# Patient Record
Sex: Male | Born: 1965 | Race: White | Hispanic: No | Marital: Married | Smoking: Former smoker
Health system: Southern US, Community
[De-identification: ages and names within clinical notes are randomized; demographics above are authoritative.]

## PROBLEM LIST (undated history)

## (undated) DIAGNOSIS — I10 Essential (primary) hypertension: Secondary | ICD-10-CM

## (undated) DIAGNOSIS — K5792 Diverticulitis of intestine, part unspecified, without perforation or abscess without bleeding: Secondary | ICD-10-CM

## (undated) DIAGNOSIS — E079 Disorder of thyroid, unspecified: Secondary | ICD-10-CM

## (undated) DIAGNOSIS — E785 Hyperlipidemia, unspecified: Secondary | ICD-10-CM

## (undated) DIAGNOSIS — K219 Gastro-esophageal reflux disease without esophagitis: Secondary | ICD-10-CM

## (undated) DIAGNOSIS — E039 Hypothyroidism, unspecified: Secondary | ICD-10-CM

## (undated) HISTORY — DX: Essential (primary) hypertension: I10

## (undated) HISTORY — DX: Diverticulitis of intestine, part unspecified, without perforation or abscess without bleeding: K57.92

## (undated) HISTORY — DX: Hyperlipidemia, unspecified: E78.5

## (undated) HISTORY — PX: HERNIA REPAIR: SHX51

## (undated) HISTORY — DX: Disorder of thyroid, unspecified: E07.9

## (undated) HISTORY — DX: Gastro-esophageal reflux disease without esophagitis: K21.9

---

## 2007-12-28 ENCOUNTER — Emergency Department: Payer: Self-pay | Admitting: Emergency Medicine

## 2009-03-20 DIAGNOSIS — K5792 Diverticulitis of intestine, part unspecified, without perforation or abscess without bleeding: Secondary | ICD-10-CM

## 2009-03-20 HISTORY — DX: Diverticulitis of intestine, part unspecified, without perforation or abscess without bleeding: K57.92

## 2009-05-21 ENCOUNTER — Inpatient Hospital Stay: Payer: Self-pay | Admitting: Internal Medicine

## 2009-12-16 ENCOUNTER — Emergency Department: Payer: Self-pay | Admitting: Emergency Medicine

## 2010-12-27 ENCOUNTER — Emergency Department: Payer: Self-pay | Admitting: Internal Medicine

## 2011-08-19 HISTORY — PX: CHOLECYSTECTOMY: SHX55

## 2011-09-02 ENCOUNTER — Emergency Department: Payer: Self-pay | Admitting: Emergency Medicine

## 2011-09-02 LAB — CBC
HGB: 16.2 g/dL (ref 13.0–18.0)
MCH: 30.8 pg (ref 26.0–34.0)
MCHC: 33.5 g/dL (ref 32.0–36.0)
MCV: 92 fL (ref 80–100)
RDW: 13.7 % (ref 11.5–14.5)

## 2011-09-02 LAB — LACTATE DEHYDROGENASE: LDH: 209 U/L (ref 87–241)

## 2011-09-02 LAB — URINALYSIS, COMPLETE
Bacteria: NONE SEEN
Ketone: NEGATIVE
Leukocyte Esterase: NEGATIVE
Ph: 5 (ref 4.5–8.0)
Squamous Epithelial: NONE SEEN

## 2011-09-02 LAB — COMPREHENSIVE METABOLIC PANEL
Albumin: 3.6 g/dL (ref 3.4–5.0)
Alkaline Phosphatase: 320 U/L — ABNORMAL HIGH (ref 50–136)
Anion Gap: 10 (ref 7–16)
BUN: 12 mg/dL (ref 7–18)
Calcium, Total: 9 mg/dL (ref 8.5–10.1)
Co2: 25 mmol/L (ref 21–32)
EGFR (Non-African Amer.): >60
Glucose: 118 mg/dL — ABNORMAL HIGH (ref 65–99)
Osmolality: 282 (ref 275–301)
SGOT(AST): 195 U/L — ABNORMAL HIGH (ref 15–37)
SGPT (ALT): 439 U/L — ABNORMAL HIGH
Sodium: 141 mmol/L (ref 136–145)
Total Protein: 7.8 g/dL (ref 6.4–8.2)

## 2012-06-13 ENCOUNTER — Ambulatory Visit: Payer: Self-pay

## 2013-10-02 ENCOUNTER — Emergency Department: Payer: Self-pay | Admitting: Emergency Medicine

## 2014-05-21 DIAGNOSIS — E039 Hypothyroidism, unspecified: Secondary | ICD-10-CM | POA: Insufficient documentation

## 2014-07-12 NOTE — Consult Note (Signed)
PATIENT NAME:  Douglas Bird, Douglas Bird MR#:  [id number] DATE OF BIRTH:  1965/05/12  DATE OF CONSULTATION:  09/02/2011  CONSULTING PHYSICIAN:  Tana Conch. Leslye Peer, MD PRIMARY CARE PHYSICIAN: [facility]  CHIEF COMPLAINT: Abdominal pain.   HISTORY OF PRESENT ILLNESS: The patient is a 49 year old man who has been having abdominal pain now for days associated with nausea, vomiting, and diarrhea, also a headache. Pain is described as 10 out of 10 in intensity, worse since yesterday. It feels like a volcano erupting pain in his abdomen. Staying very still helps, nothing else makes it better. He has been having fever and chills, yesterday developed dark orange urine. In the Emergency Room, he had an ultrasound of the abdomen that showed multiple gallstones, a mild gallbladder wall thickening, but no positive Murphy's sign. He was found to have an elevated white count at 15, an elevated lipase of 1391. His liver function tests were elevated with a total bilirubin of 7.1, alkaline phosphatase 320, ALT 439, AST 195. Hospitalist Services were contacted for further evaluation.   PAST MEDICAL HISTORY:  1. Diverticulitis. 2. Hashimoto's thyroiditis. 3. Anemia.  4. Gastroesophageal reflux disease.   PAST SURGICAL HISTORY:  1. Bilateral inguinal hernia repair.  2. Cyst on the knee.   ALLERGIES: No known drug allergies.   MEDICATIONS:  1. Prilosec 20 mg daily.  2. Ferrous sulfate 325 mg twice a day.  3. Levothyroxine 200 mcg daily.   SOCIAL HISTORY: Smokes 1 pack per day. He actually quit in the past for 10 years but started back. Alcohol: He drinks at least a sixpack at night, on the weekends either a 12-pack up to a case. He has not drank in the last five days. No drug use. He is a Occupational hygienist.   FAMILY HISTORY: Father died of a CVA with an aneurysm. also had hypertension. Mother died at 60 with complications of a knee replacement that got infected with a staph.   REVIEW OF SYSTEMS:  CONSTITUTIONAL: Positive for fever. Positive for sweating. Positive for chills. Weight up and down. Positive for fatigue. EYES: Does wear glasses. EARS, NOSE, MOUTH, AND THROAT: Positive for right ear being stopped up and decreased hearing. Positive for runny nose. Positive for dry mouth. CARDIOVASCULAR: No chest pain. No palpitations. RESPIRATORY: No shortness of breath. GASTROINTESTINAL: Positive for nausea. Positive for vomiting. Positive for abdominal pain. Occasional bright red blood per rectum. GENITOURINARY: Positive for dark urine. MUSCULOSKELETAL: Positive for joint pain. INTEGUMENT: No rashes or eruptions. NEUROLOGIC: No fainting or blackouts. PSYCHIATRIC: No anxiety or depression. ENDOCRINE: Positive for hypothyroidism. HEMATOLOGIC/LYMPHATIC: Positive for anemia.   PHYSICAL EXAMINATION:  VITAL SIGNS: Temperature 97, pulse 103, respirations 20, blood pressure 120/69, pulse oximetry 100%.   GENERAL: No respiratory distress.   HEENT: Conjunctivae icteric. Lids normal. Pupils are equal, round, and reactive to light. Extraocular muscles are intact. No nystagmus. Ears, nose, mouth, and throat: Tympanic membrane obscured by wax on the right and not on the left. Nasal mucosa no erythema. Throat no erythema. No exudate seen. Lips and gums no lesions.   NECK: No JVD. No bruits. No lymphadenopathy. No thyromegaly. No thyroid nodules palpated.   LUNGS: Lungs are clear to auscultation. No use of accessory muscles to breathe. No rhonchi, rales, or wheeze heard.   HEART: S1, S2 normal. No gallops, rubs, or murmurs heard. Carotid upstroke 2+ bilaterally. No bruits.   EXTREMITIES: Dorsalis pedis pulses 2+ bilaterally. No edema of the lower extremity.  ABDOMEN: Soft. Positive tenderness in the epigastric area  down to the umbilical area. No guarding. No rebound. Normal active bowel sounds. No masses felt.   LYMPHATIC: No lymph nodes in the neck.   MUSCULOSKELETAL: No clubbing, edema, or cyanosis.    SKIN: Icteric.   NEUROLOGICAL: Cranial nerves II through XII are grossly intact. Deep tendon reflexes 2+ bilateral lower extremities.   PSYCHIATRIC: The patient is oriented to person, place, and time.   LABORATORY, DIAGNOSTIC AND RADIOLOGICAL DATA:  Ultrasound of the abdomen showed multiple gallstones, mild gallbladder wall thickening. No positive sonographic Murphy's sign. Glucose 118, BUN 12, creatinine 1.06, sodium 141, potassium 3.4, chloride 106, CO2 25, calcium 9.0, total bilirubin 7.1, alkaline phosphatase 320, ALT 439, AST 195. White blood cell count 15.0, hemoglobin and hematocrit 16.2 and 48.3, platelet count 182, lipase 1391. Urinalysis 2+ bilirubin, 1+ blood. LDH of 209.   ASSESSMENT AND PLAN:  1. Gallstone pancreatitis, possible stone caught in the duct: I did speak with Gastroenterology, Dr. Vira Agar, who recommended a transfer to a tertiary care center since he does not perform ERCP, and we will not have any available over the weekend in case the patient needs one. I will start IV fluid hydration and give a dose of Zosyn. I spoke with Dr. Thomasene Lot to transfer the patient, and he will work on the transfer. I will give a fluid bolus and vigorous IV fluid hydration. If the patient is still here in the morning, we will check liver function tests again.  2. Alcohol abuse: I will put on CIWA protocol. No signs of withdrawal. The patient states that his last drink was five days ago.  3. Tobacco abuse: Smoking cessation counseling done three minutes by me. Nicotine patch ordered.  4. Gastroesophageal reflux disease: We will switch Prilosec over to Protonix IV with first dose stat.  5. Hypothyroidism: Continue levothyroxine.  6. Anemia history: Hemoglobin is actually good right now but probably dehydrated.  7. Cerumen impaction on the right: We will give Debrox ear drops    TIME SPENT ON EMERGENCY ROOM CONSULTATION:  55 minutes.   ____________________________ Tana Conch. Leslye Peer,  MD [id number]:[id number] D: 09/02/2011 18:10:14 ET T: 09/03/2011 10:58:58 ET JOB#: [id number]  cc: Tana Conch. Leslye Peer, MD, <Dictator> [facility] MD ELECTRONICALLY SIGNED 09/04/2011 19:45

## 2014-09-24 ENCOUNTER — Other Ambulatory Visit: Payer: Self-pay

## 2014-10-01 ENCOUNTER — Ambulatory Visit: Payer: Self-pay

## 2014-10-01 DIAGNOSIS — E782 Mixed hyperlipidemia: Secondary | ICD-10-CM | POA: Insufficient documentation

## 2015-04-01 ENCOUNTER — Ambulatory Visit: Payer: Self-pay

## 2015-04-01 DIAGNOSIS — K219 Gastro-esophageal reflux disease without esophagitis: Secondary | ICD-10-CM | POA: Insufficient documentation

## 2015-04-01 DIAGNOSIS — M109 Gout, unspecified: Secondary | ICD-10-CM | POA: Insufficient documentation

## 2015-04-01 LAB — CBC AND DIFFERENTIAL
HEMATOCRIT: 38 % — AB (ref 41–53)
HEMOGLOBIN: 12.2 g/dL — AB (ref 13.5–17.5)
Neutrophils Absolute: 4 /uL
PLATELETS: 218 10*3/uL (ref 150–399)
WBC: 7.6 10*3/mL

## 2015-04-01 LAB — LIPID PANEL
Cholesterol: 203 mg/dL — AB (ref 0–200)
HDL: 36 mg/dL (ref 35–70)
LDL CALC: 120 mg/dL
TRIGLYCERIDES: 237 mg/dL — AB (ref 40–160)

## 2015-04-01 LAB — HEPATIC FUNCTION PANEL
ALT: 22 U/L (ref 10–40)
AST: 21 U/L (ref 14–40)
Alkaline Phosphatase: 84 U/L (ref 25–125)
BILIRUBIN, TOTAL: 0.2 mg/dL

## 2015-04-01 LAB — BASIC METABOLIC PANEL
BUN: 18 mg/dL (ref 4–21)
Creatinine: 0.8 mg/dL (ref 0.6–1.3)
GLUCOSE: 107 mg/dL
Potassium: 4.7 mmol/L (ref 3.4–5.3)
SODIUM: 142 mmol/L (ref 137–147)

## 2015-04-01 LAB — HEMOGLOBIN A1C: Hemoglobin A1C: 5.7

## 2015-04-01 LAB — TSH: TSH: 1.63 u[IU]/mL (ref 0.41–5.90)

## 2015-04-15 ENCOUNTER — Ambulatory Visit: Payer: Self-pay

## 2015-04-15 DIAGNOSIS — D649 Anemia, unspecified: Secondary | ICD-10-CM | POA: Insufficient documentation

## 2015-06-28 DIAGNOSIS — M109 Gout, unspecified: Secondary | ICD-10-CM

## 2015-06-28 DIAGNOSIS — E039 Hypothyroidism, unspecified: Secondary | ICD-10-CM

## 2015-06-28 DIAGNOSIS — D649 Anemia, unspecified: Secondary | ICD-10-CM

## 2015-06-28 DIAGNOSIS — E785 Hyperlipidemia, unspecified: Secondary | ICD-10-CM

## 2015-06-28 DIAGNOSIS — K219 Gastro-esophageal reflux disease without esophagitis: Secondary | ICD-10-CM

## 2015-09-23 ENCOUNTER — Telehealth: Payer: Self-pay

## 2015-09-23 DIAGNOSIS — K219 Gastro-esophageal reflux disease without esophagitis: Secondary | ICD-10-CM

## 2015-09-23 MED ORDER — OMEPRAZOLE 20 MG PO CPDR: 20.0000 mg | DELAYED_RELEASE_CAPSULE | Freq: Every day | ORAL | Status: DC

## 2015-09-27 NOTE — Telephone Encounter (Signed)
Filled medication.

## 2015-10-05 ENCOUNTER — Emergency Department
Admission: EM | Admit: 2015-10-05 | Discharge: 2015-10-05 | Disposition: A | Discharge status: Home / Self Care | Payer: Self-pay | Attending: Emergency Medicine | Admitting: Emergency Medicine

## 2015-10-05 ENCOUNTER — Emergency Department: Payer: Self-pay

## 2015-10-05 DIAGNOSIS — S39012A Strain of muscle, fascia and tendon of lower back, initial encounter: Secondary | ICD-10-CM | POA: Insufficient documentation

## 2015-10-05 DIAGNOSIS — E039 Hypothyroidism, unspecified: Secondary | ICD-10-CM | POA: Insufficient documentation

## 2015-10-05 DIAGNOSIS — Y999 Unspecified external cause status: Secondary | ICD-10-CM | POA: Insufficient documentation

## 2015-10-05 DIAGNOSIS — Y929 Unspecified place or not applicable: Secondary | ICD-10-CM | POA: Insufficient documentation

## 2015-10-05 DIAGNOSIS — F17200 Nicotine dependence, unspecified, uncomplicated: Secondary | ICD-10-CM | POA: Insufficient documentation

## 2015-10-05 DIAGNOSIS — Y939 Activity, unspecified: Secondary | ICD-10-CM | POA: Insufficient documentation

## 2015-10-05 DIAGNOSIS — E785 Hyperlipidemia, unspecified: Secondary | ICD-10-CM | POA: Insufficient documentation

## 2015-10-05 DIAGNOSIS — X58XXXA Exposure to other specified factors, initial encounter: Secondary | ICD-10-CM | POA: Insufficient documentation

## 2015-10-05 LAB — URINALYSIS COMPLETE WITH MICROSCOPIC (ARMC ONLY)
BACTERIA UA: NONE SEEN
Bilirubin Urine: NEGATIVE
GLUCOSE, UA: NEGATIVE mg/dL
KETONES UR: NEGATIVE mg/dL
Leukocytes, UA: NEGATIVE
NITRITE: NEGATIVE
PROTEIN: NEGATIVE mg/dL
SPECIFIC GRAVITY, URINE: 1.021 (ref 1.005–1.030)
pH: 6 (ref 5.0–8.0)

## 2015-10-05 MED ORDER — NAPROXEN 500 MG PO TABS: 500.0000 mg | ORAL_TABLET | Freq: Two times a day (BID) | ORAL | Status: DC

## 2015-10-05 MED ORDER — METHOCARBAMOL 500 MG PO TABS: 500.0000 mg | ORAL_TABLET | Freq: Four times a day (QID) | ORAL | Status: DC | PRN

## 2015-10-05 NOTE — ED Notes (Signed)
Pt reports lower back pain x5 weeks; denies any urinary symptoms.

## 2015-10-05 NOTE — ED Provider Notes (Signed)
[facility] Emergency Department Provider Note   ____________________________________________  Time seen: Approximately 8:02 AM  I have reviewed the triage vital signs and the nursing notes.   HISTORY  Chief Complaint Back Pain    HPI Douglas Bird is a 50 y.o. male who presents today for evaluation of back pain x5 weeks. Patient cant recall any sort of trauma to the area. 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 The pain is in his lower left flank area. Patient denies numbness, tingling, or saddle anesthesia. He denies any redness or swelling. He does admit to heavy drinking and is concerned that the pain may be coming from his kidneys. He has tried anti-inflammatories without relief.   Past Medical History  Diagnosis Date  . GERD (gastroesophageal reflux disease)   . Thyroid disease   . Diverticulitis     Patient Active Problem List   Diagnosis Date Noted  . Anemia 04/15/2015  . GERD (gastroesophageal reflux disease) 04/01/2015  . Gout 04/01/2015  . Hyperlipidemia 10/01/2014  . Hypothyroidism 05/21/2014    Past Surgical History  Procedure Laterality Date  . Cholecystectomy  June 2013    Current Outpatient Rx  Name  Route  Sig  Dispense  Refill  . levothyroxine (SYNTHROID, LEVOTHROID) 200 MCG tablet   Oral   Take 200 mcg by mouth daily before breakfast.         . methocarbamol (ROBAXIN) 500 MG tablet   Oral   Take 1 tablet (500 mg total) by mouth every 6 (six) hours as needed for muscle spasms.   30 tablet   0   . naproxen (NAPROSYN) 500 MG tablet   Oral   Take 1 tablet (500 mg total) by mouth 2 (two) times daily with a meal.   60 tablet   0   . omeprazole (PRILOSEC) 20 MG capsule   Oral   Take 1 capsule (20 mg total) by mouth daily.   30 capsule   3   . simvastatin (ZOCOR) 20 MG tablet   Oral   Take 20 mg by mouth daily.           Allergies Review of  patient's allergies indicates no known allergies.  Family History  Problem Relation Age of Onset  . Stroke Father   . Gout Father   . Aneurysm Father     2006  . Thyroid disease Father   . Epilepsy Father   . Thyroid disease Sister   . Diabetes Paternal Grandfather     Social History Social History  Substance Use Topics  . Smoking status: Current Every Day Smoker -- 0.50 packs/day  . Smokeless tobacco: Not on file  . Alcohol Use: Yes     Comment: Social Drinker    Review of Systems Constitutional: No fever/chills Cardiovascular: Denies chest pain. Respiratory: Denies shortness of breath. Gastrointestinal: No abdominal pain.  No nausea, no vomiting.  No diarrhea.  No constipation. Genitourinary: Negative for dysuria. Musculoskeletal: Patient has left sided flank pain x5 weeks. No numbness or tingling Skin: Negative for rash. Neurological: Negative for headaches, focal weakness or numbness. 10-point ROS otherwise negative.  ____________________________________________   PHYSICAL EXAM:  VITAL SIGNS: ED Triage Vitals  Enc Vitals Group     BP 10/05/15 0758 160/106 mmHg     Pulse Rate 10/05/15 0758 83     Resp 10/05/15 0758 16     Temp 10/05/15  0758 97.4 F (36.3 C)     Temp Source 10/05/15 0758 Oral     SpO2 10/05/15 0758 96 %     Weight 10/05/15 0753 215 lb (97.523 kg)     Height 10/05/15 0753 5\' 10"  (1.778 m)     Head Cir --      Peak Flow --      Pain Score 10/05/15 0753 8     Pain Loc --      Pain Edu? --      Excl. in [place]? --     Constitutional: Alert and oriented. Well appearing and in no acute distress. Head: Atraumatic. Nose: No congestion/rhinnorhea. Mouth/Throat: Mucous membranes are moist.  Oropharynx non-erythematous. Neck: No stridor.   Cardiovascular: Normal rate, regular rhythm. Grossly normal heart sounds.  Good peripheral circulation. Respiratory: Normal respiratory effort.  No retractions. Lungs CTAB. Gastrointestinal: Soft and nontender.  No distention. No abdominal bruits. No CVA tenderness. Musculoskeletal: pain over palpation of left flank, no vertebral tenderness, no swelling or erythema Neurologic:  Normal speech and language. No gross focal neurologic deficits are appreciated. No gait instability. Skin:  Skin is warm, dry and intact. No rash noted. Psychiatric: Mood and affect are normal. Speech and behavior are normal.  ____________________________________________   LABS (all labs ordered are listed, but only abnormal results are displayed)  Labs Reviewed  URINALYSIS COMPLETEWITH MICROSCOPIC ([place] ONLY) - Abnormal; Notable for the following:    Color, Urine YELLOW (*)    APPearance CLEAR (*)    Hgb urine dipstick 1+ (*)    Squamous Epithelial / LPF 0-5 (*)    All other components within normal limits   ____________________________________________  EKG  Not indicated ____________________________________________  RADIOLOGY  FINDINGS: Three views of the lumbar spine submitted. No acute fracture or subluxation. Mild anterior spurring upper endplate of L4 and L5 vertebral body. Mild disc space flattening at L5-S1 level. Mild facet degenerative changes L5 level.  IMPRESSION: No acute fracture or subluxation. Mild degenerative changes as described above. ____________________________________________   PROCEDURES  Procedure(s) performed: None  Procedures  Critical Care performed: No  ____________________________________________   INITIAL IMPRESSION / ASSESSMENT AND PLAN / ED COURSE  Pertinent labs & imaging results that were available during my care of the patient were reviewed by me and considered in my medical decision making (see chart for details).  No fractures were seen on x-ray. UA did not show any significant abnormalities. Discussed with the patient about how to manage a muscle strain. Naproxen and a muscle relaxant were prescribed to aid in patient's pain. He was instructed to follow  up with orthopedics if the pain does not improve with rest. He is agreeable. ____________________________________________   FINAL CLINICAL IMPRESSION(S) / ED DIAGNOSES  Final diagnoses:  Lumbar strain, initial encounter      NEW MEDICATIONS STARTED DURING THIS VISIT:  Discharge Medication List as of 10/05/2015  9:06 AM    START taking these medications   Details  methocarbamol (ROBAXIN) 500 MG tablet Take 1 tablet (500 mg total) by mouth every 6 (six) hours as needed for muscle spasms., Starting 10/05/2015, Until Discontinued, Print    naproxen (NAPROSYN) 500 MG tablet Take 1 tablet (500 mg total) by mouth 2 (two) times daily with a meal., Starting 10/05/2015, Until Discontinued, Print         Note:  This document was prepared using Dragon voice recognition software and may include unintentional dictation errors.   Arlyss Repress, PA-C 10/05/15 [id number]  Shanon Brow  Caryl Never, MD 10/05/15 [phone]

## 2015-10-05 NOTE — Discharge Instructions (Signed)
Back Injury Prevention Back injuries can be very painful. They can also be difficult to heal. After having one back injury, you are more likely to injure your back again. It is important to learn how to avoid injuring or re-injuring your back. The following tips can help you to prevent a back injury. WHAT SHOULD I KNOW ABOUT PHYSICAL FITNESS?  Exercise for 30 minutes per day on most days of the week or as directed by your health care provider. Make sure to:  Do aerobic exercises, such as walking, jogging, biking, or swimming.  Do exercises that increase balance and strength, such as tai chi and yoga. These can decrease your risk of falling and injuring your back.  Do stretching exercises to help with flexibility.  Try to develop strong abdominal muscles. Your abdominal muscles provide a lot of the support that is needed by your back.  Maintain a healthy weight. This helps to decrease your risk of a back injury. WHAT SHOULD I KNOW ABOUT MY DIET?  Talk with your health care provider about your overall diet. Take supplements and vitamins only as directed by your health care provider.  Talk with your health care provider about how much calcium and vitamin D you need each day. These nutrients help to prevent weakening of the bones (osteoporosis). Osteoporosis can cause broken (fractured) bones, which lead to back pain.  Include good sources of calcium in your diet, such as dairy products, green leafy vegetables, and products that have had calcium added to them (fortified).  Include good sources of vitamin D in your diet, such as milk and foods that are fortified with vitamin D. WHAT SHOULD I KNOW ABOUT MY POSTURE?  Sit up straight and stand up straight. Avoid leaning forward when you sit or hunching over when you stand.  Choose chairs that have good low-back (lumbar) support.  If you work at a desk, sit close to it so you do not need to lean over. Keep your chin tucked in. Keep your neck  drawn back, and keep your elbows bent at a right angle. Your arms should look like the letter "L."  Sit high and close to the steering wheel when you drive. Add a lumbar support to your car seat, if needed.  Avoid sitting or standing in one position for very long. Take breaks to get up, stretch, and walk around at least one time every hour. Take breaks every hour if you are driving for long periods of time.  Sleep on your side with your knees slightly bent, or sleep on your back with a pillow under your knees. Do not lie on the front of your body to sleep. WHAT SHOULD I KNOW ABOUT LIFTING, TWISTING, AND REACHING? Lifting and Heavy Lifting  Avoid heavy lifting, especially repetitive heavy lifting. If you must do heavy lifting:  Stretch before lifting.  Work slowly.  Rest between lifts.  Use a tool such as a cart or a dolly to move objects if one is available.  Make several small trips instead of carrying one heavy load.  Ask for help when you need it, especially when moving big objects.  Follow these steps when lifting:  Stand with your feet shoulder-width apart.  Get as close to the object as you can. Do not try to pick up a heavy object that is far from your body.  Use handles or lifting straps if they are available.  Bend at your knees. Squat down, but keep your heels off the floor.  Keep your shoulders pulled back, your chin tucked in, and your back straight.  Lift the object slowly while you tighten the muscles in your legs, abdomen, and buttocks. Keep the object as close to the center of your body as possible.  Follow these steps when putting down a heavy load:  Stand with your feet shoulder-width apart.  Lower the object slowly while you tighten the muscles in your legs, abdomen, and buttocks. Keep the object as close to the center of your body as possible.  Keep your shoulders pulled back, your chin tucked in, and your back straight.  Bend at your knees. Squat  down, but keep your heels off the floor.  Use handles or lifting straps if they are available. Twisting and Reaching  Avoid lifting heavy objects above your waist.  Do not twist at your waist while you are lifting or carrying a load. If you need to turn, move your feet.  Do not bend over without bending at your knees.  Avoid reaching over your head, across a table, or for an object on a high surface. WHAT ARE SOME OTHER TIPS?  Avoid wet floors and icy ground. Keep sidewalks clear of ice to prevent falls.  Do not sleep on a mattress that is too soft or too hard.  Keep items that are used frequently within easy reach.  Put heavier objects on shelves at waist level, and put lighter objects on lower or higher shelves.  Find ways to decrease your stress, such as exercise, massage, or relaxation techniques. Stress can build up in your muscles. Tense muscles are more vulnerable to injury.  Talk with your health care provider if you feel anxious or depressed. These conditions can make back pain worse.  Wear flat heel shoes with cushioned soles.  Avoid sudden movements.  Use both shoulder straps when carrying a backpack.  Do not use any tobacco products, including cigarettes, chewing tobacco, or electronic cigarettes. If you need help quitting, ask your health care provider.   This information is not intended to replace advice given to you by your health care provider. Make sure you discuss any questions you have with your health care provider.   Document Released: 04/13/2004 Document Revised: 07/21/2014 Document Reviewed: 03/10/2014 [employer] Interactive Patient Education 2016 [place] Strain With Rehab A strain is an injury in which a tendon or muscle is torn. The muscles and tendons of the lower back are vulnerable to strains. However, these muscles and tendons are very strong and require a great force to be injured. Strains are classified into three categories. Grade  1 strains cause pain, but the tendon is not lengthened. Grade 2 strains include a lengthened ligament, due to the ligament being stretched or partially ruptured. With grade 2 strains there is still function, although the function may be decreased. Grade 3 strains involve a complete tear of the tendon or muscle, and function is usually impaired. SYMPTOMS   Pain in the lower back.  Pain that affects one side more than the other.  Pain that gets worse with movement and may be felt in the hip, buttocks, or back of the thigh.  Muscle spasms of the muscles in the back.  Swelling along the muscles of the back.  Loss of strength of the back muscles.  Crackling sound (crepitation) when the muscles are touched. CAUSES  Lower back strains occur when a force is placed on the muscles or tendons that is greater than they can handle. Common  causes of injury include:  Prolonged overuse of the muscle-tendon units in the lower back, usually from incorrect posture.  A single violent injury or force applied to the back. RISK INCREASES WITH:  Sports that involve twisting forces on the spine or a lot of bending at the waist (football, rugby, weightlifting, bowling, golf, tennis, speed skating, racquetball, swimming, running, gymnastics, diving).  Poor strength and flexibility.  Failure to warm up properly before activity.  Family history of lower back pain or disk disorders.  Previous back injury or surgery (especially fusion).  Poor posture with lifting, especially heavy objects.  Prolonged sitting, especially with poor posture. PREVENTION   Learn and use proper posture when sitting or lifting (maintain proper posture when sitting, lift using the knees and legs, not at the waist).  Warm up and stretch properly before activity.  Allow for adequate recovery between workouts.  Maintain physical fitness:  Strength, flexibility, and endurance.  Cardiovascular fitness. PROGNOSIS  If treated  properly, lower back strains usually heal within 6 weeks. RELATED COMPLICATIONS   Recurring symptoms, resulting in a chronic problem.  Chronic inflammation, scarring, and partial muscle-tendon tear.  Delayed healing or resolution of symptoms.  Prolonged disability. TREATMENT  Treatment first involves the use of ice and medicine, to reduce pain and inflammation. The use of strengthening and stretching exercises may help reduce pain with activity. These exercises may be performed at home or with a therapist. Severe injuries may require referral to a therapist for further evaluation and treatment, such as ultrasound. Your caregiver may advise that you wear a back brace or corset, to help reduce pain and discomfort. Often, prolonged bed rest results in greater harm then benefit. Corticosteroid injections may be recommended. However, these should be reserved for the most serious cases. It is important to avoid using your back when lifting objects. At night, sleep on your back on a firm mattress with a pillow placed under your knees. If non-surgical treatment is unsuccessful, surgery may be needed.  MEDICATION   If pain medicine is needed, nonsteroidal anti-inflammatory medicines (aspirin and ibuprofen), or other minor pain relievers (acetaminophen), are often advised.  Do not take pain medicine for 7 days before surgery.  Prescription pain relievers may be given, if your caregiver thinks they are needed. Use only as directed and only as much as you need.  Ointments applied to the skin may be helpful.  Corticosteroid injections may be given by your caregiver. These injections should be reserved for the most serious cases, because they may only be given a certain number of times. HEAT AND COLD  Cold treatment (icing) should be applied for 10 to 15 minutes every 2 to 3 hours for inflammation and pain, and immediately after activity that aggravates your symptoms. Use ice packs or an ice  massage.  Heat treatment may be used before performing stretching and strengthening activities prescribed by your caregiver, physical therapist, or athletic trainer. Use a heat pack or a warm water soak. SEEK MEDICAL CARE IF:   Symptoms get worse or do not improve in 2 to 4 weeks, despite treatment.  You develop numbness, weakness, or loss of bowel or bladder function.  New, unexplained symptoms develop. (Drugs used in treatment may produce side effects.) EXERCISES  RANGE OF MOTION (ROM) AND STRETCHING EXERCISES - Low Back Strain Most people with lower back pain will find that their symptoms get worse with excessive bending forward (flexion) or arching at the lower back (extension). The exercises which will help resolve  your symptoms will focus on the opposite motion.  Your physician, physical therapist or athletic trainer will help you determine which exercises will be most helpful to resolve your lower back pain. Do not complete any exercises without first consulting with your caregiver. Discontinue any exercises which make your symptoms worse until you speak to your caregiver.  If you have pain, numbness or tingling which travels down into your buttocks, leg or foot, the goal of the therapy is for these symptoms to move closer to your back and eventually resolve. Sometimes, these leg symptoms will get better, but your lower back pain may worsen. This is typically an indication of progress in your rehabilitation. Be very alert to any changes in your symptoms and the activities in which you participated in the 24 hours prior to the change. Sharing this information with your caregiver will allow him/her to most efficiently treat your condition.  These exercises may help you when beginning to rehabilitate your injury. Your symptoms may resolve with or without further involvement from your physician, physical therapist or athletic trainer. While completing these exercises, remember:  Restoring tissue  flexibility helps normal motion to return to the joints. This allows healthier, less painful movement and activity.  An effective stretch should be held for at least 30 seconds.  A stretch should never be painful. You should only feel a gentle lengthening or release in the stretched tissue. FLEXION RANGE OF MOTION AND STRETCHING EXERCISES: STRETCH - Flexion, Single Knee to Chest   Lie on a firm bed or floor with both legs extended in front of you.  Keeping one leg in contact with the floor, bring your opposite knee to your chest. Hold your leg in place by either grabbing behind your thigh or at your knee.  Pull until you feel a gentle stretch in your lower back. Hold __________ seconds.  Slowly release your grasp and repeat the exercise with the opposite side. Repeat __________ times. Complete this exercise __________ times per day.  STRETCH - Flexion, Double Knee to Chest   Lie on a firm bed or floor with both legs extended in front of you.  Keeping one leg in contact with the floor, bring your opposite knee to your chest.  Tense your stomach muscles to support your back and then lift your other knee to your chest. Hold your legs in place by either grabbing behind your thighs or at your knees.  Pull both knees toward your chest until you feel a gentle stretch in your lower back. Hold __________ seconds.  Tense your stomach muscles and slowly return one leg at a time to the floor. Repeat __________ times. Complete this exercise __________ times per day.  STRETCH - Low Trunk Rotation  Lie on a firm bed or floor. Keeping your legs in front of you, bend your knees so they are both pointed toward the ceiling and your feet are flat on the floor.  Extend your arms out to the side. This will stabilize your upper body by keeping your shoulders in contact with the floor.  Gently and slowly drop both knees together to one side until you feel a gentle stretch in your lower back. Hold for  __________ seconds.  Tense your stomach muscles to support your lower back as you bring your knees back to the starting position. Repeat the exercise to the other side. Repeat __________ times. Complete this exercise __________ times per day  EXTENSION RANGE OF MOTION AND FLEXIBILITY EXERCISES: STRETCH - Extension, Prone  on Elbows   Lie on your stomach on the floor, a bed will be too soft. Place your palms about shoulder width apart and at the height of your head.  Place your elbows under your shoulders. If this is too painful, stack pillows under your chest.  Allow your body to relax so that your hips drop lower and make contact more completely with the floor.  Hold this position for __________ seconds.  Slowly return to lying flat on the floor. Repeat __________ times. Complete this exercise __________ times per day.  RANGE OF MOTION - Extension, Prone Press Ups  Lie on your stomach on the floor, a bed will be too soft. Place your palms about shoulder width apart and at the height of your head.  Keeping your back as relaxed as possible, slowly straighten your elbows while keeping your hips on the floor. You may adjust the placement of your hands to maximize your comfort. As you gain motion, your hands will come more underneath your shoulders.  Hold this position __________ seconds.  Slowly return to lying flat on the floor. Repeat __________ times. Complete this exercise __________ times per day.  RANGE OF MOTION- Quadruped, Neutral Spine   Assume a hands and knees position on a firm surface. Keep your hands under your shoulders and your knees under your hips. You may place padding under your knees for comfort.  Drop your head and point your tail bone toward the ground below you. This will round out your lower back like an angry cat. Hold this position for __________ seconds.  Slowly lift your head and release your tail bone so that your back sags into a large arch, like an old  horse.  Hold this position for __________ seconds.  Repeat this until you feel limber in your lower back.  Now, find your "sweet spot." This will be the most comfortable position somewhere between the two previous positions. This is your neutral spine. Once you have found this position, tense your stomach muscles to support your lower back.  Hold this position for __________ seconds. Repeat __________ times. Complete this exercise __________ times per day.  STRENGTHENING EXERCISES - Low Back Strain These exercises may help you when beginning to rehabilitate your injury. These exercises should be done near your "sweet spot." This is the neutral, low-back arch, somewhere between fully rounded and fully arched, that is your least painful position. When performed in this safe range of motion, these exercises can be used for people who have either a flexion or extension based injury. These exercises may resolve your symptoms with or without further involvement from your physician, physical therapist or athletic trainer. While completing these exercises, remember:   Muscles can gain both the endurance and the strength needed for everyday activities through controlled exercises.  Complete these exercises as instructed by your physician, physical therapist or athletic trainer. Increase the resistance and repetitions only as guided.  You may experience muscle soreness or fatigue, but the pain or discomfort you are trying to eliminate should never worsen during these exercises. If this pain does worsen, stop and make certain you are following the directions exactly. If the pain is still present after adjustments, discontinue the exercise until you can discuss the trouble with your caregiver. STRENGTHENING - Deep Abdominals, Pelvic Tilt  Lie on a firm bed or floor. Keeping your legs in front of you, bend your knees so they are both pointed toward the ceiling and your feet are flat on the  floor.  Tense  your lower abdominal muscles to press your lower back into the floor. This motion will rotate your pelvis so that your tail bone is scooping upwards rather than pointing at your feet or into the floor.  With a gentle tension and even breathing, hold this position for __________ seconds. Repeat __________ times. Complete this exercise __________ times per day.  STRENGTHENING - Abdominals, Crunches   Lie on a firm bed or floor. Keeping your legs in front of you, bend your knees so they are both pointed toward the ceiling and your feet are flat on the floor. Cross your arms over your chest.  Slightly tip your chin down without bending your neck.  Tense your abdominals and slowly lift your trunk high enough to just clear your shoulder blades. Lifting higher can put excessive stress on the lower back and does not further strengthen your abdominal muscles.  Control your return to the starting position. Repeat __________ times. Complete this exercise __________ times per day.  STRENGTHENING - Quadruped, Opposite UE/LE Lift   Assume a hands and knees position on a firm surface. Keep your hands under your shoulders and your knees under your hips. You may place padding under your knees for comfort.  Find your neutral spine and gently tense your abdominal muscles so that you can maintain this position. Your shoulders and hips should form a rectangle that is parallel with the floor and is not twisted.  Keeping your trunk steady, lift your right hand no higher than your shoulder and then your left leg no higher than your hip. Make sure you are not holding your breath. Hold this position __________ seconds.  Continuing to keep your abdominal muscles tense and your back steady, slowly return to your starting position. Repeat with the opposite arm and leg. Repeat __________ times. Complete this exercise __________ times per day.  STRENGTHENING - Lower Abdominals, Double Knee Lift  Lie on a firm bed or  floor. Keeping your legs in front of you, bend your knees so they are both pointed toward the ceiling and your feet are flat on the floor.  Tense your abdominal muscles to brace your lower back and slowly lift both of your knees until they come over your hips. Be certain not to hold your breath.  Hold __________ seconds. Using your abdominal muscles, return to the starting position in a slow and controlled manner. Repeat __________ times. Complete this exercise __________ times per day.  POSTURE AND BODY MECHANICS CONSIDERATIONS - Low Back Strain Keeping correct posture when sitting, standing or completing your activities will reduce the stress put on different body tissues, allowing injured tissues a chance to heal and limiting painful experiences. The following are general guidelines for improved posture. Your physician or physical therapist will provide you with any instructions specific to your needs. While reading these guidelines, remember:  The exercises prescribed by your provider will help you have the flexibility and strength to maintain correct postures.  The correct posture provides the best environment for your joints to work. All of your joints have less wear and tear when properly supported by a spine with good posture. This means you will experience a healthier, less painful body.  Correct posture must be practiced with all of your activities, especially prolonged sitting and standing. Correct posture is as important when doing repetitive low-stress activities (typing) as it is when doing a single heavy-load activity (lifting). RESTING POSITIONS Consider which positions are most painful for you when choosing  a resting position. If you have pain with flexion-based activities (sitting, bending, stooping, squatting), choose a position that allows you to rest in a less flexed posture. You would want to avoid curling into a fetal position on your side. If your pain worsens with  extension-based activities (prolonged standing, working overhead), avoid resting in an extended position such as sleeping on your stomach. Most people will find more comfort when they rest with their spine in a more neutral position, neither too rounded nor too arched. Lying on a non-sagging bed on your side with a pillow between your knees, or on your back with a pillow under your knees will often provide some relief. Keep in mind, being in any one position for a prolonged period of time, no matter how correct your posture, can still lead to stiffness. PROPER SITTING POSTURE In order to minimize stress and discomfort on your spine, you must sit with correct posture. Sitting with good posture should be effortless for a healthy body. Returning to good posture is a gradual process. Many people can work toward this most comfortably by using various supports until they have the flexibility and strength to maintain this posture on their own. When sitting with proper posture, your ears will fall over your shoulders and your shoulders will fall over your hips. You should use the back of the chair to support your upper back. Your lower back will be in a neutral position, just slightly arched. You may place a small pillow or folded towel at the base of your lower back for support.  When working at a desk, create an environment that supports good, upright posture. Without extra support, muscles tire, which leads to excessive strain on joints and other tissues. Keep these recommendations in mind: CHAIR:  A chair should be able to slide under your desk when your back makes contact with the back of the chair. This allows you to work closely.  The chair's height should allow your eyes to be level with the upper part of your monitor and your hands to be slightly lower than your elbows. BODY POSITION  Your feet should make contact with the floor. If this is not possible, use a foot rest.  Keep your ears over your  shoulders. This will reduce stress on your neck and lower back. INCORRECT SITTING POSTURES  If you are feeling tired and unable to assume a healthy sitting posture, do not slouch or slump. This puts excessive strain on your back tissues, causing more damage and pain. Healthier options include:  Using more support, like a lumbar pillow.  Switching tasks to something that requires you to be upright or walking.  Talking a brief walk.  Lying down to rest in a neutral-spine position. PROLONGED STANDING WHILE SLIGHTLY LEANING FORWARD  When completing a task that requires you to lean forward while standing in one place for a long time, place either foot up on a stationary 2-4 inch high object to help maintain the best posture. When both feet are on the ground, the lower back tends to lose its slight inward curve. If this curve flattens (or becomes too large), then the back and your other joints will experience too much stress, tire more quickly, and can cause pain. CORRECT STANDING POSTURES Proper standing posture should be assumed with all daily activities, even if they only take a few moments, like when brushing your teeth. As in sitting, your ears should fall over your shoulders and your shoulders should fall over your  hips. You should keep a slight tension in your abdominal muscles to brace your spine. Your tailbone should point down to the ground, not behind your body, resulting in an over-extended swayback posture.  INCORRECT STANDING POSTURES  Common incorrect standing postures include a forward head, locked knees and/or an excessive swayback. WALKING Walk with an upright posture. Your ears, shoulders and hips should all line-up. PROLONGED ACTIVITY IN A FLEXED POSITION When completing a task that requires you to bend forward at your waist or lean over a low surface, try to find a way to stabilize 3 out of 4 of your limbs. You can place a hand or elbow on your thigh or rest a knee on the surface  you are reaching across. This will provide you more stability so that your muscles do not fatigue as quickly. By keeping your knees relaxed, or slightly bent, you will also reduce stress across your lower back. CORRECT LIFTING TECHNIQUES DO :   Assume a wide stance. This will provide you more stability and the opportunity to get as close as possible to the object which you are lifting.  Tense your abdominals to brace your spine. Bend at the knees and hips. Keeping your back locked in a neutral-spine position, lift using your leg muscles. Lift with your legs, keeping your back straight.  Test the weight of unknown objects before attempting to lift them.  Try to keep your elbows locked down at your sides in order get the best strength from your shoulders when carrying an object.  Always ask for help when lifting heavy or awkward objects. INCORRECT LIFTING TECHNIQUES DO NOT:   Lock your knees when lifting, even if it is a small object.  Bend and twist. Pivot at your feet or move your feet when needing to change directions.  Assume that you can safely pick up even a paper clip without proper posture.   This information is not intended to replace advice given to you by your health care provider. Make sure you discuss any questions you have with your health care provider.   Document Released: 03/06/2005 Document Revised: 03/27/2014 Document Reviewed: 06/18/2008 Elsevier Interactive Patient Education [employer].

## 2015-10-14 ENCOUNTER — Ambulatory Visit: Payer: Self-pay | Admitting: Family Medicine

## 2015-10-14 VITALS — BP 134/90 | HR 82 | Wt 211.0 lb

## 2015-10-14 DIAGNOSIS — R7303 Prediabetes: Secondary | ICD-10-CM

## 2015-10-14 DIAGNOSIS — K219 Gastro-esophageal reflux disease without esophagitis: Secondary | ICD-10-CM

## 2015-10-14 MED ORDER — OMEPRAZOLE 20 MG PO CPDR: 20.0000 mg | DELAYED_RELEASE_CAPSULE | Freq: Every day | ORAL | 1 refills | Status: DC

## 2015-10-14 MED ORDER — LEVOTHYROXINE SODIUM 200 MCG PO TABS: 200.0000 ug | ORAL_TABLET | Freq: Every day | ORAL | 1 refills | Status: DC

## 2015-10-14 MED ORDER — SIMVASTATIN 20 MG PO TABS: 20.0000 mg | ORAL_TABLET | Freq: Every day | ORAL | 1 refills | Status: DC

## 2015-10-14 NOTE — Patient Instructions (Signed)
Follow up in 6 months with labs: CMET, TSH, Lipid

## 2015-10-14 NOTE — Progress Notes (Signed)
   Subjective:    Patient ID: Douglas Bird, male    DOB: Oct 22, 1965, 50 y.o.   MRN: [id number]  HPI  Patient presents today with follow up. Patient has started smoking again. Patient is prediabetic Overall he feels pre-well. Patient Active Problem List   Diagnosis Date Noted  . Anemia 04/15/2015  . GERD (gastroesophageal reflux disease) 04/01/2015  . Gout 04/01/2015  . Hyperlipidemia 10/01/2014  . Hypothyroidism 05/21/2014      Medication List       Accurate as of 10/14/15  6:38 PM. Always use your most recent med list.          levothyroxine 200 MCG tablet Commonly known as:  SYNTHROID, LEVOTHROID Take 200 mcg by mouth daily before breakfast.   methocarbamol 500 MG tablet Commonly known as:  ROBAXIN Take 1 tablet (500 mg total) by mouth every 6 (six) hours as needed for muscle spasms.   naproxen 500 MG tablet Commonly known as:  NAPROSYN Take 1 tablet (500 mg total) by mouth 2 (two) times daily with a meal.   omeprazole 20 MG capsule Commonly known as:  PRILOSEC Take 1 capsule (20 mg total) by mouth daily.   simvastatin 20 MG tablet Commonly known as:  ZOCOR Take 20 mg by mouth daily.        Review of Systems  Respiratory: Negative.   Cardiovascular: Negative.   Musculoskeletal: Positive for arthralgias.       Objective:   Physical Exam  Constitutional: He is oriented to person, place, and time. He appears well-developed and well-nourished.  HENT:  Head: Normocephalic and atraumatic.  Eyes: Conjunctivae are normal.  Neck: Neck supple. No thyromegaly present.  Cardiovascular: Normal rate, regular rhythm, normal heart sounds and intact distal pulses.   Pulmonary/Chest: Effort normal and breath sounds normal.  Abdominal: Soft.  Lymphadenopathy:    He has no cervical adenopathy.  Neurological: He is alert and oriented to person, place, and time.  Skin: Skin is warm and dry.  Psychiatric: He has a normal mood and affect. His behavior is normal. Judgment  and thought content normal.    BP 134/90   Pulse 82   Wt 211 lb (95.7 kg)   BMI 30.28 kg/m        Assessment & Plan:  Patient encouraged to lose weight and stop smoking and continue exercising Visit Diagnosis: prediabetes Hypothyroid GERD Hyperlipidemia Follow up in: 6 months Labs in 6 months: Met C, TSH, Lipids

## 2015-10-21 ENCOUNTER — Telehealth: Payer: Self-pay

## 2015-10-21 DIAGNOSIS — K219 Gastro-esophageal reflux disease without esophagitis: Secondary | ICD-10-CM

## 2015-10-21 MED ORDER — OMEPRAZOLE 20 MG PO CPDR: 20.0000 mg | DELAYED_RELEASE_CAPSULE | Freq: Every day | ORAL | 1 refills | Status: DC

## 2015-10-21 MED ORDER — SIMVASTATIN 20 MG PO TABS: 20.0000 mg | ORAL_TABLET | Freq: Every day | ORAL | 1 refills | Status: DC

## 2015-10-21 MED ORDER — LEVOTHYROXINE SODIUM 200 MCG PO TABS: 200.0000 ug | ORAL_TABLET | Freq: Every day | ORAL | 1 refills | Status: DC

## 2015-10-25 NOTE — Telephone Encounter (Signed)
Encounter made in error. 

## 2015-10-25 NOTE — Telephone Encounter (Signed)
Medication Refill

## 2016-02-15 ENCOUNTER — Other Ambulatory Visit: Payer: Self-pay

## 2016-02-15 DIAGNOSIS — K219 Gastro-esophageal reflux disease without esophagitis: Secondary | ICD-10-CM

## 2016-02-15 MED ORDER — SIMVASTATIN 20 MG PO TABS: 20.0000 mg | ORAL_TABLET | Freq: Every day | ORAL | 0 refills | Status: DC

## 2016-02-22 ENCOUNTER — Ambulatory Visit: Payer: Self-pay | Admitting: Urology

## 2016-02-22 VITALS — BP 138/86 | HR 80 | Temp 97.9°F | Ht 70.0 in | Wt 206.0 lb

## 2016-02-22 DIAGNOSIS — E038 Other specified hypothyroidism: Secondary | ICD-10-CM

## 2016-02-22 DIAGNOSIS — K219 Gastro-esophageal reflux disease without esophagitis: Secondary | ICD-10-CM

## 2016-02-22 DIAGNOSIS — E7849 Other hyperlipidemia: Secondary | ICD-10-CM

## 2016-02-22 MED ORDER — SIMVASTATIN 20 MG PO TABS: 20.0000 mg | ORAL_TABLET | Freq: Every day | ORAL | 12 refills | Status: DC

## 2016-02-22 MED ORDER — LEVOTHYROXINE SODIUM 200 MCG PO TABS: 200.0000 ug | ORAL_TABLET | Freq: Every day | ORAL | 12 refills | Status: DC

## 2016-02-22 MED ORDER — OMEPRAZOLE 20 MG PO CPDR: 20.0000 mg | DELAYED_RELEASE_CAPSULE | Freq: Every day | ORAL | 12 refills | Status: DC

## 2016-02-22 NOTE — Progress Notes (Signed)
  Patient: Douglas Bird Male    DOB: July 17, 1965   50 y.o.   MRN: [id number] Visit Date: 02/22/2016  Today's Provider: [employer]   Chief Complaint  Patient presents with  . Medication Refill    Omeprozole, Simvastatin refill   Subjective:    HPI Patient just needs refills on his medications.  He is doing fine.  He has upcoming appointments for labs and an office visit in the next two months.      No Known Allergies Previous Medications   METHOCARBAMOL (ROBAXIN) 500 MG TABLET    Take 1 tablet (500 mg total) by mouth every 6 (six) hours as needed for muscle spasms.   NAPROXEN (NAPROSYN) 500 MG TABLET    Take 1 tablet (500 mg total) by mouth 2 (two) times daily with a meal.    Review of Systems  Social History  Substance Use Topics  . Smoking status: Current Every Day Smoker    Packs/day: 0.50  . Smokeless tobacco: Never Used  . Alcohol use Yes     Comment: Social Drinker   Objective:   BP 138/86   Pulse 80   Temp 97.9 F (36.6 C)   Ht 5\' 10"  (1.778 m)   Wt 206 lb (93.4 kg)   SpO2 97%   BMI 29.56 kg/m   Physical Exam      Assessment & Plan:  1. Hypothyroidism  - synthroid refilled  2. GERD  - omeprazole refilled  3. HLD  - simvastatin refilled  Keep scheduled appointment       [facility]

## 2016-04-06 ENCOUNTER — Other Ambulatory Visit: Payer: Self-pay

## 2016-04-11 ENCOUNTER — Other Ambulatory Visit: Payer: Self-pay

## 2016-04-11 DIAGNOSIS — R7303 Prediabetes: Secondary | ICD-10-CM

## 2016-04-11 DIAGNOSIS — K219 Gastro-esophageal reflux disease without esophagitis: Secondary | ICD-10-CM

## 2016-04-12 LAB — LIPID PANEL
Chol/HDL Ratio: 6.2 ratio units — ABNORMAL HIGH (ref 0.0–5.0)
Cholesterol, Total: 243 mg/dL — ABNORMAL HIGH (ref 100–199)
HDL: 39 mg/dL — AB (ref >39)
LDL Calculated: 157 mg/dL — ABNORMAL HIGH (ref 0–99)
Triglycerides: 237 mg/dL — ABNORMAL HIGH (ref 0–149)
VLDL Cholesterol Cal: 47 mg/dL — ABNORMAL HIGH (ref 5–40)

## 2016-04-12 LAB — COMPREHENSIVE METABOLIC PANEL
ALBUMIN: 4.2 g/dL (ref 3.5–5.5)
ALK PHOS: 81 IU/L (ref 39–117)
ALT: 20 IU/L (ref 0–44)
AST: 22 IU/L (ref 0–40)
Albumin/Globulin Ratio: 1.6 (ref 1.2–2.2)
BILIRUBIN TOTAL: 0.3 mg/dL (ref 0.0–1.2)
BUN / CREAT RATIO: 18 (ref 9–20)
BUN: 17 mg/dL (ref 6–24)
CHLORIDE: 100 mmol/L (ref 96–106)
CO2: 27 mmol/L (ref 18–29)
Calcium: 9.3 mg/dL (ref 8.7–10.2)
Creatinine, Ser: 0.94 mg/dL (ref 0.76–1.27)
GFR calc Af Amer: 108 mL/min/{1.73_m2} (ref >59)
GFR calc non Af Amer: 93 mL/min/{1.73_m2} (ref >59)
GLUCOSE: 87 mg/dL (ref 65–99)
Globulin, Total: 2.6 g/dL (ref 1.5–4.5)
Potassium: 4.5 mmol/L (ref 3.5–5.2)
Sodium: 143 mmol/L (ref 134–144)
Total Protein: 6.8 g/dL (ref 6.0–8.5)

## 2016-04-12 LAB — TSH: TSH: 16.61 u[IU]/mL — AB (ref 0.450–4.500)

## 2016-04-20 ENCOUNTER — Ambulatory Visit: Payer: Self-pay | Admitting: Adult Health Nurse Practitioner

## 2016-04-20 VITALS — BP 129/92 | HR 84 | Temp 97.7°F | Wt 219.6 lb

## 2016-04-20 DIAGNOSIS — K219 Gastro-esophageal reflux disease without esophagitis: Secondary | ICD-10-CM

## 2016-04-20 DIAGNOSIS — Z23 Encounter for immunization: Secondary | ICD-10-CM | POA: Insufficient documentation

## 2016-04-20 DIAGNOSIS — E782 Mixed hyperlipidemia: Secondary | ICD-10-CM

## 2016-04-20 DIAGNOSIS — E039 Hypothyroidism, unspecified: Secondary | ICD-10-CM

## 2016-04-20 MED ORDER — SIMVASTATIN 20 MG PO TABS: 20.0000 mg | ORAL_TABLET | Freq: Every day | ORAL | 1 refills | Status: DC

## 2016-04-20 NOTE — Progress Notes (Signed)
  Patient: Douglas Bird Male    DOB: 1965-07-24   51 y.o.   MRN: [id number] Visit Date: 04/20/2016  Today's Provider: [place]   Chief Complaint  Patient presents with  . Follow-up   Subjective:    HPI  Hypothyroidism:  TSH- 16.610.  He was out of his medications during the time of the labs.  Pt reports he is compliant with medications.    GERD:  Denies additional heartburn symptoms.    HLD:  LDL 157 on labs.  Taking medications as directed.  Trying to make diet modifications.  Reports walking.       No Known Allergies Previous Medications   LEVOTHYROXINE (SYNTHROID, LEVOTHROID) 200 MCG TABLET    Take 1 tablet (200 mcg total) by mouth daily before breakfast.   METHOCARBAMOL (ROBAXIN) 500 MG TABLET    Take 1 tablet (500 mg total) by mouth every 6 (six) hours as needed for muscle spasms.   NAPROXEN (NAPROSYN) 500 MG TABLET    Take 1 tablet (500 mg total) by mouth 2 (two) times daily with a meal.   OMEPRAZOLE (PRILOSEC) 20 MG CAPSULE    Take 1 capsule (20 mg total) by mouth daily.   SIMVASTATIN (ZOCOR) 20 MG TABLET    Take 1 tablet (20 mg total) by mouth daily.    Review of Systems  All other systems reviewed and are negative.   Social History  Substance Use Topics  . Smoking status: Current Every Day Smoker    Packs/day: 0.50  . Smokeless tobacco: Never Used  . Alcohol use Yes     Comment: Social Drinker   Objective:   BP (!) 129/92   Pulse 84   Temp 97.7 F (36.5 C)   Wt 219 lb 9.6 oz (99.6 kg)   BMI 31.51 kg/m   Physical Exam  Constitutional: He is oriented to person, place, and time. He appears well-developed and well-nourished.  HENT:  Head: Normocephalic and atraumatic.  Eyes: Pupils are equal, round, and reactive to light.  Neck: Normal range of motion. Neck supple.  Cardiovascular: Normal rate, regular rhythm and normal heart sounds.   Pulmonary/Chest: Effort normal and breath sounds normal.  Abdominal: Soft. Bowel sounds are  normal.  Neurological: He is alert and oriented to person, place, and time.  Skin: Skin is warm and dry.  Vitals reviewed.       Assessment & Plan:         HLD:  Not Controlled.  Continue current regimen.Refill Zocor.  Encourage low cholesterol, low fat diet and exercise.   Hypothyroidism:  Repeat TSH in 4 weeks.  Continue current regimen.   GERD:  Well controlled.  Continue current regimen.    [facility]

## 2016-05-18 ENCOUNTER — Other Ambulatory Visit: Payer: Self-pay

## 2016-05-18 DIAGNOSIS — E039 Hypothyroidism, unspecified: Secondary | ICD-10-CM

## 2016-05-19 LAB — TSH: TSH: 4.48 u[IU]/mL (ref 0.450–4.500)

## 2016-06-08 ENCOUNTER — Other Ambulatory Visit: Payer: Self-pay

## 2016-10-19 ENCOUNTER — Ambulatory Visit: Payer: Self-pay | Admitting: Adult Health Nurse Practitioner

## 2016-10-19 VITALS — BP 138/93 | HR 89 | Temp 98.2°F | Wt 205.6 lb

## 2016-10-19 DIAGNOSIS — E7800 Pure hypercholesterolemia, unspecified: Secondary | ICD-10-CM

## 2016-10-19 DIAGNOSIS — E039 Hypothyroidism, unspecified: Secondary | ICD-10-CM

## 2016-10-19 DIAGNOSIS — K219 Gastro-esophageal reflux disease without esophagitis: Secondary | ICD-10-CM

## 2016-10-19 DIAGNOSIS — Z0000 Encounter for general adult medical examination without abnormal findings: Secondary | ICD-10-CM

## 2016-10-19 MED ORDER — LEVOTHYROXINE SODIUM 200 MCG PO TABS: 200.0000 ug | ORAL_TABLET | Freq: Every day | ORAL | 12 refills | Status: DC

## 2016-10-19 MED ORDER — OMEPRAZOLE 20 MG PO CPDR: 20.0000 mg | DELAYED_RELEASE_CAPSULE | Freq: Every day | ORAL | 12 refills | Status: DC

## 2016-10-19 MED ORDER — SIMVASTATIN 20 MG PO TABS
20.0000 mg | ORAL_TABLET | Freq: Every day | ORAL | 1 refills | Status: DC
Start: 2016-10-19 — End: 2017-03-22

## 2016-10-19 NOTE — Progress Notes (Signed)
   Subjective:    Patient ID: Douglas Bird, male    DOB: 29-Sep-1965, 51 y.o.   MRN: [id number]  HPI   Pt here for f/u for hypothyroid. Last TSH 4.48.  Pt DBP is borderline.  Pt reports gerd well controlled with meds.  Hyperlipidemia - Pt is taking med as directed, somewhat watching diet, and exercises.  Patient Active Problem List   Diagnosis Date Noted  . Flu vaccine need 04/20/2016  . Anemia 04/15/2015  . GERD (gastroesophageal reflux disease) 04/01/2015  . Gout 04/01/2015  . Hyperlipidemia 10/01/2014  . Hypothyroidism 05/21/2014   Allergies as of 10/19/2016   No Known Allergies     Medication List       Accurate as of 10/19/16  6:00 PM. Always use your most recent med list.          levothyroxine 200 MCG tablet Commonly known as:  SYNTHROID, LEVOTHROID Take 1 tablet (200 mcg total) by mouth daily before breakfast.   methocarbamol 500 MG tablet Commonly known as:  ROBAXIN Take 1 tablet (500 mg total) by mouth every 6 (six) hours as needed for muscle spasms.   naproxen 500 MG tablet Commonly known as:  NAPROSYN Take 1 tablet (500 mg total) by mouth 2 (two) times daily with a meal.   omeprazole 20 MG capsule Commonly known as:  PRILOSEC Take 1 capsule (20 mg total) by mouth daily.   simvastatin 20 MG tablet Commonly known as:  ZOCOR Take 1 tablet (20 mg total) by mouth daily.        Review of Systems  All other systems reviewed and are negative.       Objective:   Physical Exam  Constitutional: He is oriented to person, place, and time. He appears well-developed and well-nourished.  Cardiovascular: Normal rate, regular rhythm and normal heart sounds.   Pulmonary/Chest: Effort normal and breath sounds normal.  Abdominal: Soft. Bowel sounds are normal.  Neurological: He is alert and oriented to person, place, and time.  Vitals reviewed.   BP (!) 138/93   Pulse 89   Temp 98.2 F (36.8 C)   Wt 205 lb 9.6 oz (93.3 kg)   BMI 29.50 kg/m         Assessment & Plan:   Labs today: Met C, TSH, Lipid, A1C, PSA, CBC Advised Pt to decrease salt intake.  Continue current levothyroxine dose pending labs results.  Continue Simvastatin, exercise and diet modifications.  GERD continue Omeprazole. Avoid triggers.  F/u in 6 mo.

## 2016-10-20 LAB — CBC
HEMATOCRIT: 43.3 % (ref 37.5–51.0)
HEMOGLOBIN: 14.7 g/dL (ref 13.0–17.7)
MCH: 29.7 pg (ref 26.6–33.0)
MCHC: 33.9 g/dL (ref 31.5–35.7)
MCV: 88 fL (ref 79–97)
Platelets: 210 10*3/uL (ref 150–379)
RBC: 4.95 x10E6/uL (ref 4.14–5.80)
RDW: 15.6 % — ABNORMAL HIGH (ref 12.3–15.4)
WBC: 8.1 10*3/uL (ref 3.4–10.8)

## 2016-10-20 LAB — COMPREHENSIVE METABOLIC PANEL
A/G RATIO: 1.4 (ref 1.2–2.2)
ALBUMIN: 4 g/dL (ref 3.5–5.5)
ALK PHOS: 90 IU/L (ref 39–117)
ALT: 18 IU/L (ref 0–44)
AST: 20 IU/L (ref 0–40)
BUN / CREAT RATIO: 19 (ref 9–20)
BUN: 20 mg/dL (ref 6–24)
CHLORIDE: 104 mmol/L (ref 96–106)
CO2: 24 mmol/L (ref 20–29)
Calcium: 9.3 mg/dL (ref 8.7–10.2)
Creatinine, Ser: 1.07 mg/dL (ref 0.76–1.27)
GFR calc non Af Amer: 79 mL/min/{1.73_m2} (ref >59)
GFR, EST AFRICAN AMERICAN: 92 mL/min/{1.73_m2} (ref >59)
GLUCOSE: 101 mg/dL — AB (ref 65–99)
Globulin, Total: 2.8 g/dL (ref 1.5–4.5)
POTASSIUM: 4.5 mmol/L (ref 3.5–5.2)
Sodium: 144 mmol/L (ref 134–144)
Total Protein: 6.8 g/dL (ref 6.0–8.5)

## 2016-10-20 LAB — LIPID PANEL
CHOL/HDL RATIO: 5.1 ratio — AB (ref 0.0–5.0)
Cholesterol, Total: 185 mg/dL (ref 100–199)
HDL: 36 mg/dL — ABNORMAL LOW (ref >39)
LDL CALC: 93 mg/dL (ref 0–99)
TRIGLYCERIDES: 279 mg/dL — AB (ref 0–149)
VLDL Cholesterol Cal: 56 mg/dL — ABNORMAL HIGH (ref 5–40)

## 2016-10-20 LAB — HEMOGLOBIN A1C
Est. average glucose Bld gHb Est-mCnc: 105 mg/dL
HEMOGLOBIN A1C: 5.3 % (ref 4.8–5.6)

## 2016-10-20 LAB — TSH: TSH: 0.631 u[IU]/mL (ref 0.450–4.500)

## 2016-10-20 LAB — PSA: PROSTATE SPECIFIC AG, SERUM: 1.8 ng/mL (ref 0.0–4.0)

## 2017-03-10 ENCOUNTER — Emergency Department
Admission: EM | Admit: 2017-03-10 | Discharge: 2017-03-11 | Disposition: A | Discharge status: Home / Self Care | Payer: Self-pay | Attending: Emergency Medicine | Admitting: Emergency Medicine

## 2017-03-10 ENCOUNTER — Emergency Department: Payer: Self-pay

## 2017-03-10 ENCOUNTER — Encounter: Payer: Self-pay | Admitting: Emergency Medicine

## 2017-03-10 ENCOUNTER — Other Ambulatory Visit: Payer: Self-pay

## 2017-03-10 DIAGNOSIS — Z79899 Other long term (current) drug therapy: Secondary | ICD-10-CM | POA: Insufficient documentation

## 2017-03-10 DIAGNOSIS — F1010 Alcohol abuse, uncomplicated: Secondary | ICD-10-CM | POA: Insufficient documentation

## 2017-03-10 DIAGNOSIS — E039 Hypothyroidism, unspecified: Secondary | ICD-10-CM | POA: Insufficient documentation

## 2017-03-10 DIAGNOSIS — K920 Hematemesis: Secondary | ICD-10-CM | POA: Insufficient documentation

## 2017-03-10 DIAGNOSIS — F17200 Nicotine dependence, unspecified, uncomplicated: Secondary | ICD-10-CM | POA: Insufficient documentation

## 2017-03-10 DIAGNOSIS — F10929 Alcohol use, unspecified with intoxication, unspecified: Secondary | ICD-10-CM | POA: Insufficient documentation

## 2017-03-10 DIAGNOSIS — K625 Hemorrhage of anus and rectum: Secondary | ICD-10-CM | POA: Insufficient documentation

## 2017-03-10 LAB — CBC WITH DIFFERENTIAL/PLATELET
BASOS ABS: 0 10*3/uL (ref 0–0.1)
Basophils Relative: 0 %
EOS ABS: 0.2 10*3/uL (ref 0–0.7)
EOS PCT: 2 %
HCT: 44.7 % (ref 40.0–52.0)
Hemoglobin: 15 g/dL (ref 13.0–18.0)
LYMPHS PCT: 18 %
Lymphs Abs: 1.5 10*3/uL (ref 1.0–3.6)
MCH: 30 pg (ref 26.0–34.0)
MCHC: 33.6 g/dL (ref 32.0–36.0)
MCV: 89.3 fL (ref 80.0–100.0)
Monocytes Absolute: 0.6 10*3/uL (ref 0.2–1.0)
Monocytes Relative: 7 %
NEUTROS PCT: 73 %
Neutro Abs: 6.1 10*3/uL (ref 1.4–6.5)
PLATELETS: 223 10*3/uL (ref 150–440)
RBC: 5.01 MIL/uL (ref 4.40–5.90)
RDW: 13.6 % (ref 11.5–14.5)
WBC: 8.3 10*3/uL (ref 3.8–10.6)

## 2017-03-10 LAB — PROTIME-INR
INR: 1.03
PROTHROMBIN TIME: 13.4 s (ref 11.4–15.2)

## 2017-03-10 MED ORDER — METOCLOPRAMIDE HCL 5 MG/ML IJ SOLN
10.0000 mg | Freq: Once | INTRAMUSCULAR | Status: AC
  Administered 2017-03-10: 10 mg via INTRAVENOUS
  Filled 2017-03-10: qty 2

## 2017-03-10 MED ORDER — PANTOPRAZOLE SODIUM 40 MG IV SOLR
40.0000 mg | Freq: Once | INTRAVENOUS | Status: AC
  Administered 2017-03-10: 40 mg via INTRAVENOUS
  Filled 2017-03-10: qty 40

## 2017-03-10 NOTE — ED Triage Notes (Signed)
Pt arrives via [facility] with c/o rectal bleeding and throwing up blood. Pt reports having bloody stool x 1 month. Per EMS, pt VS are WDL. EMS administered 4 mg zofran and pt has not vomited since that time. Pt in in NAD at this time.

## 2017-03-10 NOTE — ED Provider Notes (Signed)
[facility] Emergency Department Provider Note  ____________________________________________   First MD Initiated Contact with Patient 03/10/17 2334     (approximate)  I have reviewed the triage vital signs and the nursing notes.   HISTORY  Chief Complaint Hematemesis and Rectal Bleeding  Level 5 exemption history limited by the patient's alcohol intoxication  HPI Douglas Bird is a 51 y.o. male is brought to the emergency department by EMS after vomiting bright red blood.  Patient is an alcoholic and was drinking this evening when he laid down and then his girlfriend noticed that he had vomited bright red blood on the bed so she called 911.  EMS noted bright red blood as well as some coffee-ground emesis.  They gave him 4 mg of ondansetron as well as 200 cc of normal saline in route.  He says that he did have an endoscopy 8 years ago and was told he had an ulcer.  Past Medical History:  Diagnosis Date  . Diverticulitis 2011  . GERD (gastroesophageal reflux disease)   . Hyperlipidemia   . Thyroid disease     Patient Active Problem List   Diagnosis Date Noted  . Healthcare maintenance 10/19/2016  . Flu vaccine need 04/20/2016  . Anemia 04/15/2015  . GERD (gastroesophageal reflux disease) 04/01/2015  . Gout 04/01/2015  . Elevated cholesterol 10/01/2014  . Hypothyroidism 05/21/2014    Past Surgical History:  Procedure Laterality Date  . CHOLECYSTECTOMY  June 2013    Prior to Admission medications   Medication Sig Start Date End Date Taking? Authorizing Provider  famotidine (PEPCID) 20 MG tablet Take 1 tablet (20 mg total) by mouth 2 (two) times daily. 03/11/17 03/11/18  Darel Hong, MD  levothyroxine (SYNTHROID, LEVOTHROID) 200 MCG tablet Take 1 tablet (200 mcg total) by mouth daily before breakfast. 10/19/16   Doles-Johnson, Teah, NP  omeprazole (PRILOSEC) 20 MG capsule Take 1 capsule (20 mg total) by mouth daily. 10/19/16   Doles-Johnson, Teah,  NP  simvastatin (ZOCOR) 20 MG tablet Take 1 tablet (20 mg total) by mouth daily. 10/19/16   Doles-Johnson, Teah, NP    Allergies Patient has no known allergies.  Family History  Problem Relation Age of Onset  . Stroke Father   . Gout Father   . Aneurysm Father        2006  . Thyroid disease Father   . Epilepsy Father   . Thyroid disease Sister   . Diabetes Paternal Grandfather   . Cancer Mother     Social History Social History   Tobacco Use  . Smoking status: Current Every Day Smoker    Packs/day: 0.50  . Smokeless tobacco: Never Used  Substance Use Topics  . Alcohol use: Yes    Comment: Social Drinker  . Drug use: No    Review of Systems Level 5 exemption history limited by the patient's alcohol intoxication  ____________________________________________   PHYSICAL EXAM:  VITAL SIGNS: ED Triage Vitals  Enc Vitals Group     BP      Pulse      Resp      Temp      Temp src      SpO2      Weight      Height      Head Circumference      Peak Flow      Pain Score      Pain Loc      Pain Edu?  Excl. in [place]?     Constitutional: Alcohol on his breath holding a vomit bag appears somewhat uncomfortable no active vomiting Eyes: PERRL EOMI. Head: Atraumatic. Nose: No congestion/rhinnorhea. Mouth/Throat: No trismus Neck: No stridor.   Cardiovascular: Regular rate, regular rhythm. Grossly normal heart sounds.  Good peripheral circulation. Respiratory: Normal respiratory effort.  No retractions. Lungs CTAB and moving good air Gastrointestinal: Soft nondistended nontender no rebound umbilical hernia easily reduced with no skin changes Faintly guaiac positive control positive brown stool no hemorrhoids appreciated Musculoskeletal: No lower extremity edema   Neurologic:  Normal speech and language. No gross focal neurologic deficits are appreciated. Skin:  Skin is warm, dry and intact. No rash noted. Psychiatric: Mood and affect are normal. Speech and behavior  are normal.    ____________________________________________   DIFFERENTIAL includes but not limited to  Esophageal varices, gastric ulcer duodenal ulcer, Boerhaave syndrome, Mallory-Weiss tear, alcohol intoxication ____________________________________________   LABS (all labs ordered are listed, but only abnormal results are displayed)  Labs Reviewed  BASIC METABOLIC PANEL - Abnormal; Notable for the following components:      Result Value   Calcium 8.8 (*)    All other components within normal limits  ETHANOL - Abnormal; Notable for the following components:   Alcohol, Ethyl (B) 241 (*)    All other components within normal limits  HEPATIC FUNCTION PANEL - Abnormal; Notable for the following components:   Bilirubin, Direct <0.1 (*)    All other components within normal limits  TSH - Abnormal; Notable for the following components:   TSH 0.299 (*)    All other components within normal limits  LIPASE, BLOOD  CBC WITH DIFFERENTIAL/PLATELET  PROTIME-INR    Blood work reviewed by me shows elevated ethanol level otherwise no acute disease __________________________________________  EKG  ED ECG REPORT I, Darel Hong, the attending physician, personally viewed and interpreted this ECG.  Date: 03/10/2017 EKG Time:  Rate: 94 Rhythm: normal sinus rhythm QRS Axis: Rightward axis Intervals: normal ST/T Wave abnormalities: normal Narrative Interpretation: no evidence of acute ischemia.  Poor R wave progression abnormal EKG  ____________________________________________  RADIOLOGY  Chest x-ray reviewed by me with no acute disease ____________________________________________   PROCEDURES  Procedure(s) performed: no  Procedures  Critical Care performed: no  Observation: no ____________________________________________   INITIAL IMPRESSION / ASSESSMENT AND PLAN / ED COURSE  Pertinent labs & imaging results that were available during my care of the patient were  reviewed by me and considered in my medical decision making (see chart for details).  On arrival the patient is hemodynamically stable with no active bleeding.  My primary concern is this could represent esophageal varices.  2 large bore IVs as well as broad labs are pending.      _______----------------------------------------- 2:24 AM on 03/11/2017 -----------------------------------------  The patient's hemoglobin is normal and he has no evidence of cirrhosis and no stigmata of chronic liver disease.  His hematemesis today is highly likely secondary to a bleeding ulcer versus a Mallory-Weiss tear.  He is currently taking omeprazole so I advised him to add on an H2 blocker for the next month and I will refer him to gastroenterology as an outpatient.  While he is still intoxicated his girlfriend and sister at bedside who are both sober.  He is discharged home in improved condition verbalizes understanding and agreement with the plan.  _____________________________________   FINAL CLINICAL IMPRESSION(S) / ED DIAGNOSES  Final diagnoses:  Hematemesis with nausea  Alcoholic intoxication with complication ([place])  Alcohol abuse      NEW MEDICATIONS STARTED DURING THIS VISIT:  This SmartLink is deprecated. Use AVSMEDLIST instead to display the medication list for a patient.   Note:  This document was prepared using Dragon voice recognition software and may include unintentional dictation errors.     Darel Hong, MD 03/11/17 [phone]

## 2017-03-11 LAB — BASIC METABOLIC PANEL
ANION GAP: 12 (ref 5–15)
BUN: 17 mg/dL (ref 6–20)
CHLORIDE: 105 mmol/L (ref 101–111)
CO2: 24 mmol/L (ref 22–32)
Calcium: 8.8 mg/dL — ABNORMAL LOW (ref 8.9–10.3)
Creatinine, Ser: 0.91 mg/dL (ref 0.61–1.24)
GFR calc non Af Amer: >60 mL/min (ref >60)
Glucose, Bld: 90 mg/dL (ref 65–99)
POTASSIUM: 3.6 mmol/L (ref 3.5–5.1)
Sodium: 141 mmol/L (ref 135–145)

## 2017-03-11 LAB — HEPATIC FUNCTION PANEL
ALBUMIN: 4.2 g/dL (ref 3.5–5.0)
ALK PHOS: 73 U/L (ref 38–126)
ALT: 22 U/L (ref 17–63)
AST: 28 U/L (ref 15–41)
Bilirubin, Direct: <0.1 mg/dL — ABNORMAL LOW (ref 0.1–0.5)
TOTAL PROTEIN: 7.5 g/dL (ref 6.5–8.1)
Total Bilirubin: 0.5 mg/dL (ref 0.3–1.2)

## 2017-03-11 LAB — TSH: TSH: 0.299 u[IU]/mL — ABNORMAL LOW (ref 0.350–4.500)

## 2017-03-11 LAB — LIPASE, BLOOD: Lipase: 29 U/L (ref 11–51)

## 2017-03-11 LAB — ETHANOL: ALCOHOL ETHYL (B): 241 mg/dL — AB (ref <10)

## 2017-03-11 MED ORDER — FAMOTIDINE 20 MG PO TABS: 20.0000 mg | ORAL_TABLET | Freq: Two times a day (BID) | ORAL | 0 refills | Status: DC

## 2017-03-11 NOTE — Discharge Instructions (Signed)
Please begin taking pepcid twice a day for the next month and follow up with the GI doctor for a reevaluation.  Return to the ED sooner for any concerns.  It was a pleasure to take care of you today, and thank you for coming to our emergency department.  If you have any questions or concerns before leaving please ask the nurse to grab me and I'm more than happy to go through your aftercare instructions again.  If you were prescribed any opioid pain medication today such as Norco, Vicodin, Percocet, morphine, hydrocodone, or oxycodone please make sure you do not drive when you are taking this medication as it can alter your ability to drive safely.  If you have any concerns once you are home that you are not improving or are in fact getting worse before you can make it to your follow-up appointment, please do not hesitate to call 911 and come back for further evaluation.  Darel Hong, MD  Results for orders placed or performed during the hospital encounter of 72/53/66  Basic metabolic panel  Result Value Ref Range   Sodium 141 135 - 145 mmol/L   Potassium 3.6 3.5 - 5.1 mmol/L   Chloride 105 101 - 111 mmol/L   CO2 24 22 - 32 mmol/L   Glucose, Bld 90 65 - 99 mg/dL   BUN 17 6 - 20 mg/dL   Creatinine, Ser 0.91 0.61 - 1.24 mg/dL   Calcium 8.8 (L) 8.9 - 10.3 mg/dL   GFR calc non Af Amer >60 >60 mL/min   GFR calc Af Amer >60 >60 mL/min   Anion gap 12 5 - 15  Ethanol  Result Value Ref Range   Alcohol, Ethyl (B) 241 (H) <10 mg/dL  Hepatic function panel  Result Value Ref Range   Total Protein 7.5 6.5 - 8.1 g/dL   Albumin 4.2 3.5 - 5.0 g/dL   AST 28 15 - 41 U/L   ALT 22 17 - 63 U/L   Alkaline Phosphatase 73 38 - 126 U/L   Total Bilirubin 0.5 0.3 - 1.2 mg/dL   Bilirubin, Direct <0.1 (L) 0.1 - 0.5 mg/dL   Indirect Bilirubin NOT CALCULATED 0.3 - 0.9 mg/dL  Lipase, blood  Result Value Ref Range   Lipase 29 11 - 51 U/L  CBC with Differential  Result Value Ref Range   WBC 8.3 3.8 - 10.6  K/uL   RBC 5.01 4.40 - 5.90 MIL/uL   Hemoglobin 15.0 13.0 - 18.0 g/dL   HCT 44.7 40.0 - 52.0 %   MCV 89.3 80.0 - 100.0 fL   MCH 30.0 26.0 - 34.0 pg   MCHC 33.6 32.0 - 36.0 g/dL   RDW 13.6 11.5 - 14.5 %   Platelets 223 150 - 440 K/uL   Neutrophils Relative % 73 %   Neutro Abs 6.1 1.4 - 6.5 K/uL   Lymphocytes Relative 18 %   Lymphs Abs 1.5 1.0 - 3.6 K/uL   Monocytes Relative 7 %   Monocytes Absolute 0.6 0.2 - 1.0 K/uL   Eosinophils Relative 2 %   Eosinophils Absolute 0.2 0 - 0.7 K/uL   Basophils Relative 0 %   Basophils Absolute 0.0 0 - 0.1 K/uL  Protime-INR  Result Value Ref Range   Prothrombin Time 13.4 11.4 - 15.2 seconds   INR 1.03   TSH  Result Value Ref Range   TSH 0.299 (L) 0.350 - 4.500 uIU/mL   Dg Chest Port 1 View  Result Date:  03/11/2017 CLINICAL DATA:  51 year old male with vomiting. EXAM: PORTABLE CHEST 1 VIEW COMPARISON:  Chest radiograph dated 10/02/2013 FINDINGS: Shallow inspiration with minimal bibasilar atelectasis. No focal consolidation, pleural effusion, or pneumothorax. Top-normal cardiac size. No acute osseous pathology. IMPRESSION: No active disease. Electronically Signed   By: Anner Crete M.D.   On: 03/11/2017 00:08

## 2017-03-11 NOTE — ED Notes (Signed)
Sober driver sister Remijio Holleran signed pt out of ED.

## 2017-03-22 ENCOUNTER — Ambulatory Visit: Payer: Self-pay | Admitting: Family Medicine

## 2017-03-22 VITALS — BP 173/105 | HR 76 | Temp 98.2°F | Wt 207.4 lb

## 2017-03-22 DIAGNOSIS — F17200 Nicotine dependence, unspecified, uncomplicated: Secondary | ICD-10-CM

## 2017-03-22 DIAGNOSIS — Z09 Encounter for follow-up examination after completed treatment for conditions other than malignant neoplasm: Secondary | ICD-10-CM

## 2017-03-22 DIAGNOSIS — K920 Hematemesis: Secondary | ICD-10-CM

## 2017-03-22 DIAGNOSIS — K219 Gastro-esophageal reflux disease without esophagitis: Secondary | ICD-10-CM

## 2017-03-22 MED ORDER — SIMVASTATIN 20 MG PO TABS: 20.0000 mg | ORAL_TABLET | Freq: Every day | ORAL | 1 refills | Status: DC

## 2017-03-22 MED ORDER — FAMOTIDINE 20 MG PO TABS: 20.0000 mg | ORAL_TABLET | Freq: Two times a day (BID) | ORAL | 2 refills | Status: DC

## 2017-03-22 NOTE — Progress Notes (Signed)
Patient: Douglas Bird Male    DOB: 1965-11-30   52 y.o.   MRN: [id number] Visit Date: 03/22/2017  Today's Provider: Azzie Glatter, FNP   Chief Complaint  Patient presents with  . Follow-up    recent ED visit   Subjective:   HPI   Patient states that he had incident of coffee-ground Hematemesis on 03/10/2017. He was taken to ED via EMS and released. He was told to follow up with PCP.   Patient states that he had not had any incidents of bleeding since hospital visit.   States that he is doing well today. Denies fevers, chills, weight loss, night sweats. Denies headaches, dizziness and falls. Denies abdominal pain, nausea, vomiting. Denies pain.       Allergies as of 03/22/2017   No Known Allergies     Medication List        Accurate as of 03/22/17  8:22 PM. Always use your most recent med list.          famotidine 20 MG tablet Commonly known as:  PEPCID Take 1 tablet (20 mg total) by mouth 2 (two) times daily.   levothyroxine 200 MCG tablet Commonly known as:  SYNTHROID, LEVOTHROID Take 1 tablet (200 mcg total) by mouth daily before breakfast.   omeprazole 20 MG capsule Commonly known as:  PRILOSEC Take 1 capsule (20 mg total) by mouth daily.   simvastatin 20 MG tablet Commonly known as:  ZOCOR Take 1 tablet (20 mg total) by mouth daily.       Review of Systems  Constitutional: Negative.   HENT: Negative.   Eyes: Negative.   Respiratory: Positive for cough.   Gastrointestinal: Positive for abdominal distention.  Endocrine: Negative.   Genitourinary: Negative.   Musculoskeletal: Negative.   Skin: Negative.   Allergic/Immunologic: Negative.   Neurological: Negative.   Hematological: Negative.   Psychiatric/Behavioral: Negative.     Social History   Tobacco Use  . Smoking status: Current Every Day Smoker    Packs/day: 0.50  . Smokeless tobacco: Never Used  Substance Use Topics  . Alcohol use: Yes    Comment: Social Drinker   Objective:   BP  (!) 173/105 (BP Location: Left Arm, Patient Position: Sitting, Cuff Size: Normal)   Pulse 76   Temp 98.2 F (36.8 C)   Wt 207 lb 6.4 oz (94.1 kg)   BMI 29.76 kg/m   Physical Exam  Constitutional: He is oriented to person, place, and time. He appears well-developed and well-nourished.  HENT:  Head: Normocephalic and atraumatic.  Right Ear: External ear normal.  Left Ear: External ear normal.  Mouth/Throat: Oropharynx is clear and moist.  Eyes: Conjunctivae and EOM are normal. Pupils are equal, round, and reactive to light.  Neck: Normal range of motion. Neck supple.  Cardiovascular: Normal rate, regular rhythm, normal heart sounds and intact distal pulses.  Pulmonary/Chest: Effort normal. He has wheezes.  Right lobes  Abdominal: Soft. Bowel sounds are normal.  Musculoskeletal: Normal range of motion.  Neurological: He is alert and oriented to person, place, and time.  Skin: Skin is warm and dry.  Psychiatric: He has a normal mood and affect. His behavior is normal. Judgment and thought content normal.       Assessment & Plan:   1. Hematemesis, presence of nausea not specified Follow up after ED admission for coffee-ground emesis. Stable. Continue to monitor.  Will refer for colonoscopy.  2. Gastroesophageal reflux disease, esophagitis presence not specified Continue Pepcid as directed.  Refill sent to pharmacy.  3. Smoker Counseled on benefits of discontinuing smoking. Monitor.  4. Follow up 3 months. Labs/OV.    Azzie Glatter, FNP   [facility]

## 2017-04-24 ENCOUNTER — Ambulatory Visit: Payer: Self-pay | Admitting: Adult Health Nurse Practitioner

## 2017-04-24 VITALS — BP 126/83 | HR 84 | Temp 98.0°F | Wt 208.2 lb

## 2017-04-24 DIAGNOSIS — K219 Gastro-esophageal reflux disease without esophagitis: Secondary | ICD-10-CM

## 2017-04-24 DIAGNOSIS — E7800 Pure hypercholesterolemia, unspecified: Secondary | ICD-10-CM

## 2017-04-24 DIAGNOSIS — E039 Hypothyroidism, unspecified: Secondary | ICD-10-CM

## 2017-04-24 NOTE — Progress Notes (Signed)
  Patient: Douglas Bird Male    DOB: 1965-03-28   52 y.o.   MRN: [id number] Visit Date: 04/24/2017  Today's Provider: Staci Acosta, NP   Chief Complaint  Patient presents with  . Follow-up    6 mos   Subjective:    HPI  Here for FU.  BP much improved from previous- last visit 173/105.   Last TSH 0.299- no medication adjustment.   Last LDL-93 in August.  Taking medications as directed with no reports of myalgias or abdominal pain.   Denies any hematemesis.  Stable on medications.     No Known Allergies Previous Medications   FAMOTIDINE (PEPCID) 20 MG TABLET    Take 1 tablet (20 mg total) by mouth 2 (two) times daily.   LEVOTHYROXINE (SYNTHROID, LEVOTHROID) 200 MCG TABLET    Take 1 tablet (200 mcg total) by mouth daily before breakfast.   OMEPRAZOLE (PRILOSEC) 20 MG CAPSULE    Take 1 capsule (20 mg total) by mouth daily.   SIMVASTATIN (ZOCOR) 20 MG TABLET    Take 1 tablet (20 mg total) by mouth daily.    Review of Systems  All other systems reviewed and are negative.   Social History   Tobacco Use  . Smoking status: Current Every Day Smoker    Packs/day: 0.50  . Smokeless tobacco: Never Used  Substance Use Topics  . Alcohol use: Yes    Comment: Social Drinker   Objective:   BP 126/83   Pulse 84   Temp 98 F (36.7 C)   Wt 208 lb 3.2 oz (94.4 kg)   BMI 29.87 kg/m   Physical Exam  Constitutional: He appears well-developed and well-nourished.  HENT:  Head: Normocephalic and atraumatic.  Neck: Normal range of motion. Neck supple. Thyromegaly present.  Cardiovascular: Normal rate, regular rhythm and normal heart sounds.  Pulmonary/Chest: Effort normal and breath sounds normal.  Abdominal: Soft. Bowel sounds are normal.  Skin: Skin is warm and dry.  Vitals reviewed.       Assessment & Plan:         HLD:  Lipid profile today.   Continue current regimen.  Encourage low cholesterol, low fat diet and exercise.   HYPOTHYROID: Check TSH today.   Continue current medications- adjust pending lab results.   GERD:  Continue current regimen.   Staci Acosta, NP   [facility]

## 2017-04-25 LAB — COMPREHENSIVE METABOLIC PANEL
ALT: 18 IU/L (ref 0–44)
AST: 20 IU/L (ref 0–40)
Albumin/Globulin Ratio: 1.6 (ref 1.2–2.2)
Albumin: 4.2 g/dL (ref 3.5–5.5)
Alkaline Phosphatase: 74 IU/L (ref 39–117)
BILIRUBIN TOTAL: 0.3 mg/dL (ref 0.0–1.2)
BUN/Creatinine Ratio: 16 (ref 9–20)
BUN: 16 mg/dL (ref 6–24)
CHLORIDE: 105 mmol/L (ref 96–106)
CO2: 18 mmol/L — ABNORMAL LOW (ref 20–29)
Calcium: 9.3 mg/dL (ref 8.7–10.2)
Creatinine, Ser: 1.03 mg/dL (ref 0.76–1.27)
GFR calc non Af Amer: 83 mL/min/{1.73_m2} (ref >59)
GFR, EST AFRICAN AMERICAN: 96 mL/min/{1.73_m2} (ref >59)
GLUCOSE: 95 mg/dL (ref 65–99)
Globulin, Total: 2.7 g/dL (ref 1.5–4.5)
POTASSIUM: 4.4 mmol/L (ref 3.5–5.2)
Sodium: 142 mmol/L (ref 134–144)
TOTAL PROTEIN: 6.9 g/dL (ref 6.0–8.5)

## 2017-04-25 LAB — LIPID PANEL
Chol/HDL Ratio: 4.1 ratio (ref 0.0–5.0)
Cholesterol, Total: 199 mg/dL (ref 100–199)
HDL: 48 mg/dL (ref >39)
LDL Calculated: 120 mg/dL — ABNORMAL HIGH (ref 0–99)
Triglycerides: 155 mg/dL — ABNORMAL HIGH (ref 0–149)
VLDL CHOLESTEROL CAL: 31 mg/dL (ref 5–40)

## 2017-04-25 LAB — TSH: TSH: 0.877 u[IU]/mL (ref 0.450–4.500)

## 2017-04-26 ENCOUNTER — Ambulatory Visit: Payer: Self-pay

## 2017-05-08 ENCOUNTER — Other Ambulatory Visit: Payer: Self-pay

## 2017-05-08 DIAGNOSIS — K219 Gastro-esophageal reflux disease without esophagitis: Secondary | ICD-10-CM

## 2017-05-08 NOTE — Telephone Encounter (Signed)
Patient requested to have medications transferred from [facility] to [facility].

## 2017-05-10 MED ORDER — FAMOTIDINE 20 MG PO TABS: 20.0000 mg | ORAL_TABLET | Freq: Two times a day (BID) | ORAL | 2 refills | Status: DC

## 2017-05-10 MED ORDER — LEVOTHYROXINE SODIUM 200 MCG PO TABS: 200.0000 ug | ORAL_TABLET | Freq: Every day | ORAL | 12 refills | Status: DC

## 2017-05-10 MED ORDER — OMEPRAZOLE 20 MG PO CPDR: 20.0000 mg | DELAYED_RELEASE_CAPSULE | Freq: Every day | ORAL | 12 refills | Status: DC

## 2017-05-10 MED ORDER — SIMVASTATIN 20 MG PO TABS: 20.0000 mg | ORAL_TABLET | Freq: Every day | ORAL | 1 refills | Status: DC

## 2017-06-06 ENCOUNTER — Encounter (INDEPENDENT_AMBULATORY_CARE_PROVIDER_SITE_OTHER): Payer: Self-pay

## 2017-06-06 ENCOUNTER — Ambulatory Visit: Payer: Self-pay | Admitting: Pharmacy Technician

## 2017-06-06 DIAGNOSIS — Z79899 Other long term (current) drug therapy: Secondary | ICD-10-CM

## 2017-06-06 NOTE — Progress Notes (Signed)
Completed [facility] application and contract.  Patient agreed to all terms of the [facility] contract.    Patient approved to receive medication assistance at [facility] through 2019, as long as eligibility criteria continues to be met.    Provided patient with community resource material based on his particular needs.    Garner Dullea J. Breydan [place] Care Manager [facility]  

## 2017-06-21 ENCOUNTER — Ambulatory Visit: Payer: Self-pay

## 2017-07-05 ENCOUNTER — Ambulatory Visit: Payer: Self-pay | Admitting: Adult Health Nurse Practitioner

## 2017-07-05 VITALS — BP 140/92 | HR 80 | Temp 98.4°F | Wt 207.2 lb

## 2017-07-05 DIAGNOSIS — K219 Gastro-esophageal reflux disease without esophagitis: Secondary | ICD-10-CM

## 2017-07-05 DIAGNOSIS — E7800 Pure hypercholesterolemia, unspecified: Secondary | ICD-10-CM

## 2017-07-05 DIAGNOSIS — E039 Hypothyroidism, unspecified: Secondary | ICD-10-CM

## 2017-07-05 MED ORDER — SIMVASTATIN 20 MG PO TABS: 20.0000 mg | ORAL_TABLET | Freq: Every day | ORAL | 3 refills | Status: DC

## 2017-07-05 NOTE — Progress Notes (Signed)
  Patient: Douglas Bird Male    DOB: 28-Jan-1966   52 y.o.   MRN: [id number] Visit Date: 07/05/2017  Today's Provider: Staci Acosta, NP   Chief Complaint  Patient presents with  . Follow-up    Follow Up / Check Up, Refills   Subjective:    HPI  Here for follow up and medical management.     No Known Allergies Previous Medications   FAMOTIDINE (PEPCID) 20 MG TABLET    Take 1 tablet (20 mg total) by mouth 2 (two) times daily.   LEVOTHYROXINE (SYNTHROID, LEVOTHROID) 200 MCG TABLET    Take 1 tablet (200 mcg total) by mouth daily before breakfast.   OMEPRAZOLE (PRILOSEC) 20 MG CAPSULE    Take 1 capsule (20 mg total) by mouth daily.   SIMVASTATIN (ZOCOR) 20 MG TABLET    Take 1 tablet (20 mg total) by mouth daily.    Review of Systems  All other systems reviewed and are negative.   Social History   Tobacco Use  . Smoking status: Current Every Day Smoker    Packs/day: 0.50  . Smokeless tobacco: Never Used  Substance Use Topics  . Alcohol use: Yes    Comment: Social Drinker   Objective:   BP (!) 140/92 (BP Location: Right Arm)   Pulse 80   Temp 98.4 F (36.9 C)   Wt 207 lb 3.2 oz (94 kg)   SpO2 97%   BMI 29.73 kg/m   Physical Exam  Constitutional: He appears well-developed and well-nourished.  HENT:  Head: Normocephalic and atraumatic.  Neck: Normal range of motion. No thyromegaly present.  Cardiovascular: Normal rate.  Pulmonary/Chest: Effort normal and breath sounds normal.  Abdominal: Soft. Bowel sounds are normal.  Neurological: He is alert.  Skin: Skin is warm and dry.  Vitals reviewed.       Assessment & Plan:         HLD: .  Continue current regimen.  Encourage low cholesterol, low fat diet and exercise.   Hypothyroidism:  Continue current therapy.   GERD:  Continue current therapy. Avoid triggers.   FU as scheduled in August.    [name], NP   [facility]

## 2017-07-16 ENCOUNTER — Encounter (INDEPENDENT_AMBULATORY_CARE_PROVIDER_SITE_OTHER): Payer: Self-pay

## 2017-07-16 ENCOUNTER — Ambulatory Visit: Payer: Self-pay | Admitting: Pharmacist

## 2017-07-16 ENCOUNTER — Other Ambulatory Visit: Payer: Self-pay

## 2017-07-16 VITALS — BP 140/90 | Wt 209.0 lb

## 2017-07-16 DIAGNOSIS — Z79899 Other long term (current) drug therapy: Secondary | ICD-10-CM

## 2017-07-16 NOTE — Progress Notes (Signed)
  [facility] Visit Note  Patient: Douglas Bird MRN: [id number] Date of Birth: 1965/06/20 PCP: Staci Acosta, NP   [name] 52 y.o. male presents for an initial medication management clinic visit with the pharmacy resident and pharmacy student today.  BP 140/90   Wt 209 lb (94.8 kg)   BMI 29.99 kg/m   Patient Information   Past Medical History:  Diagnosis Date  . Diverticulitis 2011  . GERD (gastroesophageal reflux disease)   . Hyperlipidemia   . Thyroid disease       Past Surgical History:  Procedure Laterality Date  . CHOLECYSTECTOMY  June 2013     Family History  Problem Relation Age of Onset  . Stroke Father   . Gout Father   . Aneurysm Father        2006  . Thyroid disease Father   . Epilepsy Father   . Thyroid disease Sister   . Diabetes Paternal Grandfather   . Cancer Mother     Diet: Has decreased consumption of red meat and choosing lower sodium options. States he enjoys peanut butter and jelly on wheat bread for lunch. Has increased the amount of veggies/fruits in diet and has been trying to drink about five 16oz water bottles/day.   Exercise: States he goes for a walk every evening with his fiance.   Encouraged to continue making health diet choices and to continue walking.           Social History   Substance and Sexual Activity  Alcohol Use Yes   Comment: Social Drinker      Social History   Tobacco Use  Smoking Status Current Every Day Smoker  . Packs/day: 0.50  Smokeless Tobacco Never Used      Health Maintenance  Topic Date Due  . PNEUMOCOCCAL POLYSACCHARIDE VACCINE (1) 10/10/1966  . FOOT EXAM  10/10/1974  . OPHTHALMOLOGY EXAM  10/10/1974  . URINE MICROALBUMIN  10/10/1974  . HIV Screening  10/10/1979  . TETANUS/TDAP  10/10/1983  . COLONOSCOPY  10/10/2014  . HEMOGLOBIN A1C  04/21/2017  . INFLUENZA VACCINE  10/18/2017     Assessment and Plan:  1. Medication Adherence: Good. Has been on same  medications for a while now.   2. GERD: Omeprazole and famotidine; well-controlled. Takes omeprazole daily and famotidine prn. Discussed non-pharm options (decrease spicy foods/elevate head when sleeping/don't eat too close to bed) to help reduce GERD.   3. Blood pressure: Not on blood pressure medication. BP at appointment today 140/90 mmHg. Continue to follow blood pressure and consider medication if increasing. With current health diet changes and exercise, may see BP improvement.   4. Smoking: Currently smokes about 1 pack every 3 days. States he quit for 8 years at one point but picked it back up again during a stressful time in his life. States he is attempting to decreasing amount of cigs on his own with the goal of quitting. Is not interested in trying NRT at this time. States peppermint hard candy helps curve cravings.   5. Hypothyroidism: Synthroid. Most recent TSH within normal limits. Continue current therapy.   Will follow up with patient in 1 year or sooner if any questions or concerns arise.   Candelaria Stagers, PharmD Pharmacy Resident

## 2017-10-23 ENCOUNTER — Ambulatory Visit: Payer: Self-pay | Admitting: Adult Health Nurse Practitioner

## 2017-10-23 VITALS — BP 112/71 | HR 78 | Temp 98.0°F | Ht 67.0 in | Wt 208.5 lb

## 2017-10-23 DIAGNOSIS — E039 Hypothyroidism, unspecified: Secondary | ICD-10-CM

## 2017-10-23 DIAGNOSIS — E782 Mixed hyperlipidemia: Secondary | ICD-10-CM

## 2017-10-23 DIAGNOSIS — K219 Gastro-esophageal reflux disease without esophagitis: Secondary | ICD-10-CM

## 2017-10-23 MED ORDER — SIMVASTATIN 20 MG PO TABS: 20.0000 mg | ORAL_TABLET | Freq: Every day | ORAL | 3 refills | Status: DC

## 2017-10-23 NOTE — Progress Notes (Signed)
   Subjective:    Patient ID: Douglas Bird, male    DOB: Feb 01, 1966, 52 y.o.   MRN: [id number]  HPI  Douglas Bird is a 52 yo M here for 6 mo f/u for HLD, Hypothyroidism, and GERD. He endorses taking all meds as Rx. He needs refill of Simvastatin.   Patient Active Problem List   Diagnosis Date Noted  . Healthcare maintenance 10/19/2016  . Flu vaccine need 04/20/2016  . Anemia 04/15/2015  . GERD (gastroesophageal reflux disease) 04/01/2015  . Gout 04/01/2015  . Hyperlipidemia 10/01/2014  . Hypothyroidism 05/21/2014   Allergies as of 10/23/2017   No Known Allergies     Medication List        Accurate as of 10/23/17  7:56 PM. Always use your most recent med list.          famotidine 20 MG tablet Commonly known as:  PEPCID Take 1 tablet (20 mg total) by mouth 2 (two) times daily.   levothyroxine 200 MCG tablet Commonly known as:  SYNTHROID, LEVOTHROID Take 1 tablet (200 mcg total) by mouth daily before breakfast.   omeprazole 20 MG capsule Commonly known as:  PRILOSEC Take 1 capsule (20 mg total) by mouth daily.   simvastatin 20 MG tablet Commonly known as:  ZOCOR Take 1 tablet (20 mg total) by mouth daily.        Review of Systems  All other systems reviewed and are negative.      Objective:   Physical Exam  Constitutional: He is oriented to person, place, and time. He appears well-developed and well-nourished.  Cardiovascular: Normal rate, regular rhythm, normal heart sounds and intact distal pulses.  Pulmonary/Chest: Effort normal and breath sounds normal.  Abdominal: Soft. Bowel sounds are normal.  Neurological: He is alert and oriented to person, place, and time.  Skin: Skin is warm and dry.  Psychiatric: He has a normal mood and affect. His behavior is normal. Judgment and thought content normal.  Vitals reviewed.   BP 112/71 (BP Location: Left Arm, Patient Position: Sitting)   Pulse 78   Temp 98 F (36.7 C) (Oral)   Ht 5\' 7"  (1.702 m)   Wt 208 lb 8  oz (94.6 kg)   BMI 32.66 kg/m        Assessment & Plan:   HLD: Continue current regimen.   Hypothyroidism: Continue current therapy.  GERD: Continue current therapy. Avoid triggers.   Schedule routine labs in 1 week: TSH, MetC, Lipids, A1C,   F/u in 6 mo for routine care.

## 2017-10-30 ENCOUNTER — Other Ambulatory Visit: Payer: Self-pay

## 2017-11-01 ENCOUNTER — Ambulatory Visit: Payer: Self-pay | Admitting: Ophthalmology

## 2017-11-01 ENCOUNTER — Other Ambulatory Visit: Payer: Self-pay

## 2017-11-01 DIAGNOSIS — E039 Hypothyroidism, unspecified: Secondary | ICD-10-CM

## 2017-11-01 DIAGNOSIS — E782 Mixed hyperlipidemia: Secondary | ICD-10-CM

## 2017-11-02 LAB — COMPREHENSIVE METABOLIC PANEL
ALBUMIN: 4 g/dL (ref 3.5–5.5)
ALT: 20 IU/L (ref 0–44)
AST: 15 IU/L (ref 0–40)
Albumin/Globulin Ratio: 1.4 (ref 1.2–2.2)
Alkaline Phosphatase: 90 IU/L (ref 39–117)
BILIRUBIN TOTAL: 0.3 mg/dL (ref 0.0–1.2)
BUN/Creatinine Ratio: 18 (ref 9–20)
BUN: 15 mg/dL (ref 6–24)
CALCIUM: 8.8 mg/dL (ref 8.7–10.2)
CHLORIDE: 107 mmol/L — AB (ref 96–106)
CO2: 23 mmol/L (ref 20–29)
CREATININE: 0.82 mg/dL (ref 0.76–1.27)
GFR calc Af Amer: 117 mL/min/{1.73_m2} (ref >59)
GFR calc non Af Amer: 101 mL/min/{1.73_m2} (ref >59)
GLUCOSE: 96 mg/dL (ref 65–99)
Globulin, Total: 2.8 g/dL (ref 1.5–4.5)
Potassium: 4.1 mmol/L (ref 3.5–5.2)
Sodium: 144 mmol/L (ref 134–144)
Total Protein: 6.8 g/dL (ref 6.0–8.5)

## 2017-11-02 LAB — LIPID PANEL
CHOL/HDL RATIO: 5.6 ratio — AB (ref 0.0–5.0)
Cholesterol, Total: 201 mg/dL — ABNORMAL HIGH (ref 100–199)
HDL: 36 mg/dL — ABNORMAL LOW (ref >39)
LDL Calculated: 111 mg/dL — ABNORMAL HIGH (ref 0–99)
Triglycerides: 271 mg/dL — ABNORMAL HIGH (ref 0–149)
VLDL Cholesterol Cal: 54 mg/dL — ABNORMAL HIGH (ref 5–40)

## 2017-11-02 LAB — CBC
HEMATOCRIT: 44.3 % (ref 37.5–51.0)
Hemoglobin: 14.7 g/dL (ref 13.0–17.7)
MCH: 30.9 pg (ref 26.6–33.0)
MCHC: 33.2 g/dL (ref 31.5–35.7)
MCV: 93 fL (ref 79–97)
PLATELETS: 179 10*3/uL (ref 150–450)
RBC: 4.75 x10E6/uL (ref 4.14–5.80)
RDW: 13.6 % (ref 12.3–15.4)
WBC: 6.9 10*3/uL (ref 3.4–10.8)

## 2017-11-02 LAB — TSH: TSH: 0.538 u[IU]/mL (ref 0.450–4.500)

## 2017-11-08 ENCOUNTER — Ambulatory Visit: Payer: Self-pay | Admitting: Ophthalmology

## 2017-11-15 ENCOUNTER — Ambulatory Visit: Payer: Self-pay | Admitting: Ophthalmology

## 2018-04-30 ENCOUNTER — Ambulatory Visit: Payer: Self-pay | Admitting: Family Medicine

## 2018-04-30 VITALS — BP 134/93 | HR 79 | Temp 98.1°F | Ht 68.0 in | Wt 219.4 lb

## 2018-04-30 DIAGNOSIS — Z5181 Encounter for therapeutic drug level monitoring: Secondary | ICD-10-CM

## 2018-04-30 DIAGNOSIS — I10 Essential (primary) hypertension: Secondary | ICD-10-CM

## 2018-04-30 DIAGNOSIS — E039 Hypothyroidism, unspecified: Secondary | ICD-10-CM

## 2018-04-30 DIAGNOSIS — E782 Mixed hyperlipidemia: Secondary | ICD-10-CM

## 2018-04-30 DIAGNOSIS — E669 Obesity, unspecified: Secondary | ICD-10-CM

## 2018-04-30 NOTE — Assessment & Plan Note (Signed)
Encouraged weight loss 

## 2018-04-30 NOTE — Progress Notes (Signed)
BP (!) 134/93 (BP Location: Left Arm, Patient Position: Sitting, Cuff Size: Normal)   Pulse 79   Temp 98.1 F (36.7 C)   Ht '5\' 8"'  (1.727 m)   Wt 219 lb 6.4 oz (99.5 kg)   BMI 33.36 kg/m    Subjective:    Patient ID: Douglas Bird, male    DOB: [birth year], 53 y.o.   MRN: [id number]  HPI: Douglas Bird is a 53 y.o. male  Chief Complaint  Patient presents with  . Follow-up    HPI Patient is here for f/u Hypertension; eating more salt lately; gaining weight; eaeting a lot of junk foods; pork; loves garlic  Thyroid disease Father had it, youngest sister too No constipation; just weight gain; energy level is good  Lab Results  Component Value Date   TSH 0.538 11/01/2017   High cholesterol; father and mother passed; mother died 06/19/02, father 52; he had major stroke and aneurysm; mother had heart trouble, big heavy set woman; grandfather had diabetes  Lab Results  Component Value Date   CHOL 201 (H) 11/01/2017   HDL 36 (L) 11/01/2017   LDLCALC 111 (H) 11/01/2017   TRIG 271 (H) 11/01/2017   CHOLHDL 5.6 (H) 11/01/2017    Obesity; gained some weight recently  PPI for GERD; also has famotidine  No flowsheet data found. No flowsheet data found.  Relevant past medical, surgical, family and social history reviewed Past Medical History:  Diagnosis Date  . Diverticulitis Jun 18, 2009  . GERD (gastroesophageal reflux disease)   . Hyperlipidemia   . Thyroid disease    Past Surgical History:  Procedure Laterality Date  . CHOLECYSTECTOMY  June 2013   Family History  Problem Relation Age of Onset  . Stroke Father   . Gout Father   . Aneurysm Father        18-Jun-2004  . Thyroid disease Father   . Epilepsy Father   . Thyroid disease Sister   . Diabetes Paternal Grandfather   . Cancer Mother    Social History   Tobacco Use  . Smoking status: Current Every Day Smoker    Packs/day: 0.50    Years: 15.00    Pack years: 7.50    Types: Cigarettes  . Smokeless tobacco: Never  Used  Substance Use Topics  . Alcohol use: Yes    Alcohol/week: 12.0 standard drinks    Types: 12 Cans of beer per week    Comment: Social Drinker  . Drug use: No     Interim medical history since last visit reviewed. Allergies and medications reviewed  Review of Systems Per HPI unless specifically indicated above     Objective:    BP (!) 134/93 (BP Location: Left Arm, Patient Position: Sitting, Cuff Size: Normal)   Pulse 79   Temp 98.1 F (36.7 C)   Ht '5\' 8"'  (1.727 m)   Wt 219 lb 6.4 oz (99.5 kg)   BMI 33.36 kg/m   Wt Readings from Last 3 Encounters:  04/30/18 219 lb 6.4 oz (99.5 kg)  10/23/17 208 lb 8 oz (94.6 kg)  07/16/17 209 lb (94.8 kg)    Physical Exam Constitutional:      General: He is not in acute distress.    Appearance: He is well-developed. He is obese.  HENT:     Head: Normocephalic and atraumatic.  Eyes:     General: No scleral icterus. Neck:     Thyroid: No thyromegaly.  Cardiovascular:     Rate and Rhythm: Normal  rate and regular rhythm.  Pulmonary:     Effort: Pulmonary effort is normal.     Breath sounds: Normal breath sounds.  Abdominal:     General: Bowel sounds are normal. There is no distension.     Palpations: Abdomen is soft.  Skin:    General: Skin is warm and dry.     Coloration: Skin is not pale.  Neurological:     Mental Status: He is alert.     Coordination: Coordination normal.  Psychiatric:        Behavior: Behavior normal.        Thought Content: Thought content normal.        Judgment: Judgment normal.     Results for orders placed or performed in visit on 11/01/17  CBC  Result Value Ref Range   WBC 6.9 3.4 - 10.8 x10E3/uL   RBC 4.75 4.14 - 5.80 x10E6/uL   Hemoglobin 14.7 13.0 - 17.7 g/dL   Hematocrit 44.3 37.5 - 51.0 %   MCV 93 79 - 97 fL   MCH 30.9 26.6 - 33.0 pg   MCHC 33.2 31.5 - 35.7 g/dL   RDW 13.6 12.3 - 15.4 %   Platelets 179 150 - 450 x10E3/uL  Lipid Profile  Result Value Ref Range   Cholesterol,  Total 201 (H) 100 - 199 mg/dL   Triglycerides 271 (H) 0 - 149 mg/dL   HDL 36 (L) >39 mg/dL   VLDL Cholesterol Cal 54 (H) 5 - 40 mg/dL   LDL Calculated 111 (H) 0 - 99 mg/dL   Chol/HDL Ratio 5.6 (H) 0.0 - 5.0 ratio  Comp Met (CMET)  Result Value Ref Range   Glucose 96 65 - 99 mg/dL   BUN 15 6 - 24 mg/dL   Creatinine, Ser 0.82 0.76 - 1.27 mg/dL   GFR calc non Af Amer 101 >59 mL/min/1.73   GFR calc Af Amer 117 >59 mL/min/1.73   BUN/Creatinine Ratio 18 9 - 20   Sodium 144 134 - 144 mmol/L   Potassium 4.1 3.5 - 5.2 mmol/L   Chloride 107 (H) 96 - 106 mmol/L   CO2 23 20 - 29 mmol/L   Calcium 8.8 8.7 - 10.2 mg/dL   Total Protein 6.8 6.0 - 8.5 g/dL   Albumin 4.0 3.5 - 5.5 g/dL   Globulin, Total 2.8 1.5 - 4.5 g/dL   Albumin/Globulin Ratio 1.4 1.2 - 2.2   Bilirubin Total 0.3 0.0 - 1.2 mg/dL   Alkaline Phosphatase 90 39 - 117 IU/L   AST 15 0 - 40 IU/L   ALT 20 0 - 44 IU/L  TSH  Result Value Ref Range   TSH 0.538 0.450 - 4.500 uIU/mL      Assessment & Plan:   Problem List Items Addressed This Visit      Cardiovascular and Mediastinum   Benign hypertension    He opted to work on weight loss, healthier eating; return for visit to recheck labs; consider med adjustment at that time if not to goal; DASH guidelines encouraged        Endocrine   Hypothyroidism (Chronic)    Check TSH, adjust medicine if needed      Relevant Orders   TSH     Other   Obesity (BMI 30.0-34.9) (Chronic)    Encouraged weight loss      Hyperlipidemia - Primary    Encouraged diet low in saturated fats, weight loss; check lipids      Relevant Orders  Lipid panel    Other Visit Diagnoses    Medication monitoring encounter       Relevant Orders   Comprehensive metabolic panel       Follow up plan: Return in about 4 weeks (around 05/28/2018) for blood pressure with provider.  An after-visit summary was printed and given to the patient at [facility].  Please see the patient instructions which may  contain other information and recommendations beyond what is mentioned above in the assessment and plan.  No orders of the defined types were placed in this encounter.   Orders Placed This Encounter  Procedures  . Comprehensive metabolic panel  . Lipid panel  . TSH

## 2018-04-30 NOTE — Assessment & Plan Note (Signed)
Encouraged diet low in saturated fats, weight loss; check lipids

## 2018-04-30 NOTE — Patient Instructions (Addendum)
I do encourage you to quit smoking Call [phone] to sign up for smoking cessation classes You can call 1-800-QUIT-NOW to talk with a smoking cessation coach  Check out the information at familydoctor.org entitled "Nutrition for Weight Loss: What You Need to Know about Fad Diets" Try to lose between 1-2 pounds per week by taking in fewer calories and burning off more calories You can succeed by limiting portions, limiting foods dense in calories and fat, becoming more active, and drinking 8 glasses of water a day (64 ounces) Don't skip meals, especially breakfast, as skipping meals may alter your metabolism Do not use over-the-counter weight loss pills or gimmicks that claim rapid weight loss A healthy BMI (or body mass index) is between 18.5 and 24.9 You can calculate your ideal BMI at the [place] website ClubMonetize.fr  Try to follow the DASH guidelines (DASH stands for Dietary Approaches to Stop Hypertension). Try to limit the sodium in your diet to no more than 1,500mg  of sodium per day. Certainly try to not exceed 2,000 mg per day at the very most. Do not add salt when cooking or at the table.  Check the sodium amount on labels when shopping, and choose items lower in sodium when given a choice. Avoid or limit foods that already contain a lot of sodium. Eat a diet rich in fruits and vegetables and whole grains, and try to lose weight if overweight or obese   Obesity, Adult Obesity is the condition of having too much total body fat. Being overweight or obese means that your weight is greater than what is considered healthy for your body size. Obesity is determined by a measurement called BMI. BMI is an estimate of body fat and is calculated from height and weight. For adults, a BMI of 30 or higher is considered obese. Obesity can eventually lead to other health concerns and major illnesses, including:  Stroke.  Coronary artery disease  (CAD).  Type 2 diabetes.  Some types of cancer, including cancers of the colon, breast, uterus, and gallbladder.  Osteoarthritis.  High blood pressure (hypertension).  High cholesterol.  Sleep apnea.  Gallbladder stones.  Infertility problems. What are the causes? The main cause of obesity is taking in (consuming) more calories than your body uses for energy. Other factors that contribute to this condition may include:  Being born with genes that make you more likely to become obese.  Having a medical condition that causes obesity. These conditions include: ? Hypothyroidism. ? Polycystic ovarian syndrome (PCOS). ? Binge-eating disorder. ? Cushing syndrome.  Taking certain medicines, such as steroids, antidepressants, and seizure medicines.  Not being physically active (sedentary lifestyle).  Living where there are limited places to exercise safely or buy healthy foods.  Not getting enough sleep. What increases the risk? The following factors may increase your risk of this condition:  Having a family history of obesity.  Being a woman of African-American descent.  Being a man of Hispanic descent. What are the signs or symptoms? Having excessive body fat is the main symptom of this condition. How is this diagnosed? This condition may be diagnosed based on:  Your symptoms.  Your medical history.  A physical exam. Your health care provider may measure: ? Your BMI. If you are an adult with a BMI between 25 and less than 30, you are considered overweight. If you are an adult with a BMI of 30 or higher, you are considered obese. ? The distances around your hips and your waist (circumferences).  These may be compared to each other to help diagnose your condition. ? Your skinfold thickness. Your health care provider may gently pinch a fold of your skin and measure it. How is this treated? Treatment for this condition often includes changing your lifestyle. Treatment may  include some or all of the following:  Dietary changes. Work with your health care provider and a dietitian to set a weight-loss goal that is healthy and reasonable for you. Dietary changes may include eating: ? Smaller portions. A portion size is the amount of a particular food that is healthy for you to eat at one time. This varies from person to person. ? Low-calorie or low-fat options. ? More whole grains, fruits, and vegetables.  Regular physical activity. This may include aerobic activity (cardio) and strength training.  Medicine to help you lose weight. Your health care provider may prescribe medicine if you are unable to lose 1 pound a week after 6 weeks of eating more healthily and doing more physical activity.  Surgery. Surgical options may include gastric banding and gastric bypass. Surgery may be done if: ? Other treatments have not helped to improve your condition. ? You have a BMI of 40 or higher. ? You have life-threatening health problems related to obesity. Follow these instructions at home:  Eating and drinking   Follow recommendations from your health care provider about what you eat and drink. Your health care provider may advise you to: ? Limit fast foods, sweets, and processed snack foods. ? Choose low-fat options, such as low-fat milk instead of whole milk. ? Eat 5 or more servings of fruits or vegetables every day. ? Eat at home more often. This gives you more control over what you eat. ? Choose healthy foods when you eat out. ? Learn what a healthy portion size is. ? Keep low-fat snacks on hand. ? Avoid sugary drinks, such as soda, fruit juice, iced tea sweetened with sugar, and flavored milk. ? Eat a healthy breakfast.  Drink enough water to keep your urine clear or pale yellow.  Do not go without eating for long periods of time (do not fast) or follow a fad diet. Fasting and fad diets can be unhealthy and even dangerous. Physical Activity  Exercise  regularly, as told by your health care provider. Ask your health care provider what types of exercise are safe for you and how often you should exercise.  Warm up and stretch before being active.  Cool down and stretch after being active.  Rest between periods of activity. Lifestyle  Limit the time that you spend in front of your TV, computer, or video game system.  Find ways to reward yourself that do not involve food.  Limit alcohol intake to no more than 1 drink a day for nonpregnant women and 2 drinks a day for men. One drink equals 12 oz of beer, 5 oz of wine, or 1 oz of hard liquor. General instructions  Keep a weight loss journal to keep track of the food you eat and how much you exercise you get.  Take over-the-counter and prescription medicines only as told by your health care provider.  Take vitamins and supplements only as told by your health care provider.  Consider joining a support group. Your health care provider may be able to recommend a support group.  Keep all follow-up visits as told by your health care provider. This is important. Contact a health care provider if:  You are unable to meet your  weight loss goal after 6 weeks of dietary and lifestyle changes. This information is not intended to replace advice given to you by your health care provider. Make sure you discuss any questions you have with your health care provider. Document Released: 04/13/2004 Document Revised: 08/09/2015 Document Reviewed: 12/23/2014 Elsevier Interactive Patient Education  2019 Elsevier Inc.  Preventing Unhealthy [employer], Adult Staying at a healthy weight is important to your overall health. When fat builds up in your body, you may become overweight or obese. Being overweight or obese increases your risk of developing certain health problems, such as heart disease, diabetes, sleeping problems, joint problems, and some types of cancer. Unhealthy weight gain is often the result  of making unhealthy food choices or not getting enough exercise. You can make changes to your lifestyle to prevent obesity and stay as healthy as possible. What nutrition changes can be made?   Eat only as much as your body needs. To do this: ? Pay attention to signs that you are hungry or full. Stop eating as soon as you feel full. ? If you feel hungry, try drinking water first before eating. Drink enough water so your urine is clear or pale yellow. ? Eat smaller portions. Pay attention to portion sizes when eating out. ? Look at serving sizes on food labels. Most foods contain more than one serving per container. ? Eat the recommended number of calories for your gender and activity level. For most active people, a daily total of 2,000 calories is appropriate. If you are trying to lose weight or are not very active, you may need to eat fewer calories. Talk with your health care provider or a diet and nutrition specialist (dietitian) about how many calories you need each day.  Choose healthy foods, such as: ? Fruits and vegetables. At each meal, try to fill at least half of your plate with fruits and vegetables. ? Whole grains, such as whole-wheat bread, brown rice, and quinoa. ? Lean meats, such as chicken or fish. ? Other healthy proteins, such as beans, eggs, or tofu. ? Healthy fats, such as nuts, seeds, fatty fish, and olive oil. ? Low-fat or fat-free dairy products.  Check food labels, and avoid food and drinks that: ? Are high in calories. ? Have added sugar. ? Are high in sodium. ? Have saturated fats or trans fats.  Cook foods in healthier ways, such as by baking, broiling, or grilling.  Make a meal plan for the week, and shop with a grocery list to help you stay on track with your purchases. Try to avoid going to the grocery store when you are hungry.  When grocery shopping, try to shop around the outside of the store first, where the fresh foods are. Doing this helps you to  avoid prepackaged foods, which can be high in sugar, salt (sodium), and fat. What lifestyle changes can be made?   Exercise for 30 or more minutes on 5 or more days each week. Exercising may include brisk walking, yard work, biking, running, swimming, and team sports like basketball and soccer. Ask your health care provider which exercises are safe for you.  Do muscle-strengthening activities, such as lifting weights or using resistance bands, on 2 or more days a week.  Do not use any products that contain nicotine or tobacco, such as cigarettes and e-cigarettes. If you need help quitting, ask your health care provider.  Limit alcohol intake to no more than 1 drink a day for nonpregnant women  and 2 drinks a day for men. One drink equals 12 oz of beer, 5 oz of wine, or 1 oz of hard liquor.  Try to get 7-9 hours of sleep each night. What other changes can be made?  Keep a food and activity journal to keep track of: ? What you ate and how many calories you had. Remember to count the calories in sauces, dressings, and side dishes. ? Whether you were active, and what exercises you did. ? Your calorie, weight, and activity goals.  Check your weight regularly. Track any changes. If you notice you have gained weight, make changes to your diet or activity routine.  Avoid taking weight-loss medicines or supplements. Talk to your health care provider before starting any new medicine or supplement.  Talk to your health care provider before trying any new diet or exercise plan. Why are these changes important? Eating healthy, staying active, and having healthy habits can help you to prevent obesity. Those changes also:  Help you manage stress and emotions.  Help you connect with friends and family.  Improve your self-esteem.  Improve your sleep.  Prevent long-term health problems. What can happen if changes are not made? Being obese or overweight can cause you to develop joint or bone  problems, which can make it hard for you to stay active or do activities you enjoy. Being obese or overweight also puts stress on your heart and lungs and can lead to health problems like diabetes, heart disease, and some cancers. Where to find more information Talk with your health care provider or a dietitian about healthy eating and healthy lifestyle choices. You may also find information from:  U.S. Department of Agriculture, MyPlate: FormerBoss.no  American Heart Association: www.heart.org  Centers for Disease Control and Prevention: http://www.wolf.info/ Summary  Staying at a healthy weight is important to your overall health. It helps you to prevent certain diseases and health problems, such as heart disease, diabetes, joint problems, sleep disorders, and some types of cancer.  Being obese or overweight can cause you to develop joint or bone problems, which can make it hard for you to stay active or do activities you enjoy.  You can prevent unhealthy weight gain by eating a healthy diet, exercising regularly, not smoking, limiting alcohol, and getting enough sleep.  Talk with your health care provider or a dietitian for guidance about healthy eating and healthy lifestyle choices. This information is not intended to replace advice given to you by your health care provider. Make sure you discuss any questions you have with your health care provider. Document Released: 03/07/2016 Document Revised: 12/15/2016 Document Reviewed: 04/12/2016 Elsevier Interactive Patient Education  2019 [place] DASH stands for "Dietary Approaches to Stop Hypertension." The DASH eating plan is a healthy eating plan that has been shown to reduce high blood pressure (hypertension). It may also reduce your risk for type 2 diabetes, heart disease, and stroke. The DASH eating plan may also help with weight loss. What are tips for following this plan?  General guidelines  Avoid eating more  than 2,300 mg (milligrams) of salt (sodium) a day. If you have hypertension, you may need to reduce your sodium intake to 1,500 mg a day.  Limit alcohol intake to no more than 1 drink a day for nonpregnant women and 2 drinks a day for men. One drink equals 12 oz of beer, 5 oz of wine, or 1 oz of hard liquor.  Work with your health care  provider to maintain a healthy body weight or to lose weight. Ask what an ideal weight is for you.  Get at least 30 minutes of exercise that causes your heart to beat faster (aerobic exercise) most days of the week. Activities may include walking, swimming, or biking.  Work with your health care provider or diet and nutrition specialist (dietitian) to adjust your eating plan to your individual calorie needs. Reading food labels   Check food labels for the amount of sodium per serving. Choose foods with less than 5 percent of the Daily Value of sodium. Generally, foods with less than 300 mg of sodium per serving fit into this eating plan.  To find whole grains, look for the word "whole" as the first word in the ingredient list. Shopping  Buy products labeled as "low-sodium" or "no salt added."  Buy fresh foods. Avoid canned foods and premade or frozen meals. Cooking  Avoid adding salt when cooking. Use salt-free seasonings or herbs instead of table salt or sea salt. Check with your health care provider or pharmacist before using salt substitutes.  Do not fry foods. Cook foods using healthy methods such as baking, boiling, grilling, and broiling instead.  Cook with heart-healthy oils, such as olive, canola, soybean, or sunflower oil. Meal planning  Eat a balanced diet that includes: ? 5 or more servings of fruits and vegetables each day. At each meal, try to fill half of your plate with fruits and vegetables. ? Up to 6-8 servings of whole grains each day. ? Less than 6 oz of lean meat, poultry, or fish each day. A 3-oz serving of meat is about the same  size as a deck of cards. One egg equals 1 oz. ? 2 servings of low-fat dairy each day. ? A serving of nuts, seeds, or beans 5 times each week. ? Heart-healthy fats. Healthy fats called Omega-3 fatty acids are found in foods such as flaxseeds and coldwater fish, like sardines, salmon, and mackerel.  Limit how much you eat of the following: ? Canned or prepackaged foods. ? Food that is high in trans fat, such as fried foods. ? Food that is high in saturated fat, such as fatty meat. ? Sweets, desserts, sugary drinks, and other foods with added sugar. ? Full-fat dairy products.  Do not salt foods before eating.  Try to eat at least 2 vegetarian meals each week.  Eat more home-cooked food and less restaurant, buffet, and fast food.  When eating at a restaurant, ask that your food be prepared with less salt or no salt, if possible. What foods are recommended? The items listed may not be a complete list. Talk with your dietitian about what dietary choices are best for you. Grains Whole-grain or whole-wheat bread. Whole-grain or whole-wheat pasta. Brown rice. [place]. Bulgur. Whole-grain and low-sodium cereals. Pita bread. Low-fat, low-sodium crackers. Whole-wheat flour tortillas. Vegetables Fresh or frozen vegetables (raw, steamed, roasted, or grilled). Low-sodium or reduced-sodium tomato and vegetable juice. Low-sodium or reduced-sodium tomato sauce and tomato paste. Low-sodium or reduced-sodium canned vegetables. Fruits All fresh, dried, or frozen fruit. Canned fruit in natural juice (without added sugar). Meat and other protein foods Skinless chicken or [place]. Ground chicken or [place]. Pork with fat trimmed off. Fish and seafood. Egg whites. Dried beans, peas, or lentils. Unsalted nuts, nut butters, and seeds. Unsalted canned beans. Lean cuts of beef with fat trimmed off. Low-sodium, lean deli meat. Dairy Low-fat (1%) or fat-free (skim) milk. Fat-free, low-fat, or reduced-fat  cheeses. Nonfat, low-sodium ricotta or cottage cheese. Low-fat or nonfat yogurt. Low-fat, low-sodium cheese. Fats and oils Soft margarine without trans fats. Vegetable oil. Low-fat, reduced-fat, or light mayonnaise and salad dressings (reduced-sodium). Canola, safflower, olive, soybean, and sunflower oils. Avocado. Seasoning and other foods Herbs. Spices. Seasoning mixes without salt. Unsalted popcorn and pretzels. Fat-free sweets. What foods are not recommended? The items listed may not be a complete list. Talk with your dietitian about what dietary choices are best for you. Grains Baked goods made with fat, such as croissants, muffins, or some breads. Dry pasta or rice meal packs. Vegetables Creamed or fried vegetables. Vegetables in a cheese sauce. Regular canned vegetables (not low-sodium or reduced-sodium). Regular canned tomato sauce and paste (not low-sodium or reduced-sodium). Regular tomato and vegetable juice (not low-sodium or reduced-sodium). Angie Fava. Olives. Fruits Canned fruit in a light or heavy syrup. Fried fruit. Fruit in cream or butter sauce. Meat and other protein foods Fatty cuts of meat. Ribs. Fried meat. Berniece Salines. Sausage. Bologna and other processed lunch meats. Salami. Fatback. Hotdogs. Bratwurst. Salted nuts and seeds. Canned beans with added salt. Canned or smoked fish. Whole eggs or egg yolks. Chicken or [place] with skin. Dairy Whole or 2% milk, cream, and half-and-half. Whole or full-fat cream cheese. Whole-fat or sweetened yogurt. Full-fat cheese. Nondairy creamers. Whipped toppings. Processed cheese and cheese spreads. Fats and oils Butter. Stick margarine. Lard. Shortening. Ghee. Bacon fat. Tropical oils, such as coconut, palm kernel, or palm oil. Seasoning and other foods Salted popcorn and pretzels. Onion salt, garlic salt, seasoned salt, table salt, and sea salt. Worcestershire sauce. Tartar sauce. Barbecue sauce. Teriyaki sauce. Soy sauce, including  reduced-sodium. Steak sauce. Canned and packaged gravies. Fish sauce. Oyster sauce. Cocktail sauce. Horseradish that you find on the shelf. Ketchup. Mustard. Meat flavorings and tenderizers. Bouillon cubes. Hot sauce and Tabasco sauce. Premade or packaged marinades. Premade or packaged taco seasonings. Relishes. Regular salad dressings. Where to find more information:  National Heart, Lung, and [place]: https://wilson-eaton.com/  American Heart Association: www.heart.org Summary  The DASH eating plan is a healthy eating plan that has been shown to reduce high blood pressure (hypertension). It may also reduce your risk for type 2 diabetes, heart disease, and stroke.  With the DASH eating plan, you should limit salt (sodium) intake to 2,300 mg a day. If you have hypertension, you may need to reduce your sodium intake to 1,500 mg a day.  When on the DASH eating plan, aim to eat more fresh fruits and vegetables, whole grains, lean proteins, low-fat dairy, and heart-healthy fats.  Work with your health care provider or diet and nutrition specialist (dietitian) to adjust your eating plan to your individual calorie needs. This information is not intended to replace advice given to you by your health care provider. Make sure you discuss any questions you have with your health care provider. Document Released: 02/23/2011 Document Revised: 02/28/2016 Document Reviewed: 02/28/2016 Elsevier Interactive Patient Education  2019 [employer].

## 2018-04-30 NOTE — Assessment & Plan Note (Signed)
Check TSH, adjust medicine if needed

## 2018-05-02 DIAGNOSIS — I10 Essential (primary) hypertension: Secondary | ICD-10-CM | POA: Insufficient documentation

## 2018-05-02 NOTE — Assessment & Plan Note (Signed)
He opted to work on weight loss, healthier eating; return for visit to recheck labs; consider med adjustment at that time if not to goal; DASH guidelines encouraged

## 2018-05-09 ENCOUNTER — Other Ambulatory Visit: Payer: Self-pay

## 2018-05-14 ENCOUNTER — Other Ambulatory Visit: Payer: Self-pay

## 2018-05-16 ENCOUNTER — Other Ambulatory Visit: Payer: Self-pay

## 2018-05-16 DIAGNOSIS — E039 Hypothyroidism, unspecified: Secondary | ICD-10-CM

## 2018-05-16 DIAGNOSIS — E782 Mixed hyperlipidemia: Secondary | ICD-10-CM

## 2018-05-16 DIAGNOSIS — Z5181 Encounter for therapeutic drug level monitoring: Secondary | ICD-10-CM

## 2018-05-17 LAB — SPECIMEN STATUS REPORT

## 2018-05-17 LAB — COMPREHENSIVE METABOLIC PANEL
ALBUMIN: 4.3 g/dL (ref 3.8–4.9)
ALT: 20 IU/L (ref 0–44)
AST: 18 IU/L (ref 0–40)
Albumin/Globulin Ratio: 2 (ref 1.2–2.2)
Alkaline Phosphatase: 87 IU/L (ref 39–117)
BUN/Creatinine Ratio: 17 (ref 9–20)
BUN: 16 mg/dL (ref 6–24)
Bilirubin Total: 0.4 mg/dL (ref 0.0–1.2)
CALCIUM: 9.2 mg/dL (ref 8.7–10.2)
CO2: 24 mmol/L (ref 20–29)
CREATININE: 0.96 mg/dL (ref 0.76–1.27)
Chloride: 105 mmol/L (ref 96–106)
GFR calc Af Amer: 104 mL/min/{1.73_m2} (ref >59)
GFR, EST NON AFRICAN AMERICAN: 90 mL/min/{1.73_m2} (ref >59)
GLOBULIN, TOTAL: 2.2 g/dL (ref 1.5–4.5)
Glucose: 87 mg/dL (ref 65–99)
Potassium: 4.9 mmol/L (ref 3.5–5.2)
SODIUM: 144 mmol/L (ref 134–144)
Total Protein: 6.5 g/dL (ref 6.0–8.5)

## 2018-05-17 LAB — LIPID PANEL
CHOLESTEROL TOTAL: 186 mg/dL (ref 100–199)
Chol/HDL Ratio: 4.9 ratio (ref 0.0–5.0)
HDL: 38 mg/dL — ABNORMAL LOW (ref >39)
LDL Calculated: 113 mg/dL — ABNORMAL HIGH (ref 0–99)
Triglycerides: 175 mg/dL — ABNORMAL HIGH (ref 0–149)
VLDL Cholesterol Cal: 35 mg/dL (ref 5–40)

## 2018-05-17 LAB — TSH: TSH: 0.909 u[IU]/mL (ref 0.450–4.500)

## 2018-05-30 ENCOUNTER — Ambulatory Visit: Payer: Self-pay | Admitting: Adult Health Nurse Practitioner

## 2018-05-30 ENCOUNTER — Other Ambulatory Visit: Payer: Self-pay

## 2018-05-30 VITALS — BP 146/93 | HR 76 | Temp 98.0°F | Ht 68.0 in | Wt 214.6 lb

## 2018-05-30 DIAGNOSIS — E782 Mixed hyperlipidemia: Secondary | ICD-10-CM

## 2018-05-30 DIAGNOSIS — E039 Hypothyroidism, unspecified: Secondary | ICD-10-CM

## 2018-05-30 DIAGNOSIS — K219 Gastro-esophageal reflux disease without esophagitis: Secondary | ICD-10-CM

## 2018-05-30 DIAGNOSIS — I10 Essential (primary) hypertension: Secondary | ICD-10-CM

## 2018-05-30 MED ORDER — LEVOTHYROXINE SODIUM 200 MCG PO TABS: 200.0000 ug | ORAL_TABLET | Freq: Every day | ORAL | 12 refills | Status: DC

## 2018-05-30 MED ORDER — OMEPRAZOLE 20 MG PO CPDR: 20.0000 mg | DELAYED_RELEASE_CAPSULE | Freq: Every day | ORAL | 1 refills | Status: DC

## 2018-05-30 MED ORDER — SIMVASTATIN 20 MG PO TABS: 20.0000 mg | ORAL_TABLET | Freq: Every day | ORAL | 3 refills | Status: DC

## 2018-05-30 MED ORDER — LISINOPRIL 10 MG PO TABS: 10.0000 mg | ORAL_TABLET | Freq: Every day | ORAL | 6 refills | Status: DC

## 2018-05-30 NOTE — Progress Notes (Signed)
  Patient: Douglas Bird Male    DOB: [birth year]   53 y.o.   MRN: [id number] Visit Date: 05/30/2018  Today's Provider: Staci Acosta, NP   Chief Complaint  Patient presents with  . Follow-up    Have cut back on salt, started exercising some, smoking less   Subjective:    HPI   Last visit BP was 134/93- opted for healthy lifestyle changes.  5lbs down .  LDL slightly elevated at 113. Continues on Zocor.   TSH stable.   No Known Allergies Previous Medications   FAMOTIDINE (PEPCID) 20 MG TABLET    Take 1 tablet (20 mg total) by mouth 2 (two) times daily.   LEVOTHYROXINE (SYNTHROID, LEVOTHROID) 200 MCG TABLET    Take 1 tablet (200 mcg total) by mouth daily before breakfast.   OMEPRAZOLE (PRILOSEC) 20 MG CAPSULE    Take 20 mg by mouth daily.   SIMVASTATIN (ZOCOR) 20 MG TABLET    Take 1 tablet (20 mg total) by mouth daily.    Review of Systems  All other systems reviewed and are negative.   Social History   Tobacco Use  . Smoking status: Current Every Day Smoker    Packs/day: 0.50    Years: 15.00    Pack years: 7.50    Types: Cigarettes  . Smokeless tobacco: Never Used  Substance Use Topics  . Alcohol use: Yes    Alcohol/week: 12.0 standard drinks    Types: 12 Cans of beer per week    Comment: Social Drinker   Objective:   BP (!) 146/93 (BP Location: Left Arm, Patient Position: Sitting, Cuff Size: Normal)   Pulse 76   Temp 98 F (36.7 C) (Oral)   Ht 5\' 8"  (1.727 m)   Wt 214 lb 9.6 oz (97.3 kg)   BMI 32.63 kg/m   Physical Exam Vitals signs reviewed.  Constitutional:      Appearance: Normal appearance.  Neck:     Musculoskeletal: Normal range of motion and neck supple.  Cardiovascular:     Rate and Rhythm: Normal rate and regular rhythm.  Pulmonary:     Effort: Pulmonary effort is normal.     Breath sounds: Normal breath sounds.  Abdominal:     General: Bowel sounds are normal.     Palpations: Abdomen is soft.  Skin:    General: Skin is warm and dry.   Neurological:     Mental Status: He is alert.         Assessment & Plan:         HTN:  Borderline.  Goal BP <140/90.  Start Lisinopril 10mg  daily- may be able to get off after continued healthy lifestyle changes.   Encourage low salt diet and exercise.   Continue current levothyroxine.   HLD:   Continue current regimen.  Encourage low cholesterol, low fat diet and exercise.   Refilled Omeparzole.    Labs reviewed.  All medications refilled.   Staci Acosta, NP   [facility] [place]

## 2018-07-11 ENCOUNTER — Other Ambulatory Visit: Payer: Self-pay

## 2018-07-11 ENCOUNTER — Ambulatory Visit: Payer: Self-pay | Admitting: Adult Health Nurse Practitioner

## 2018-07-11 DIAGNOSIS — I10 Essential (primary) hypertension: Secondary | ICD-10-CM

## 2018-07-11 NOTE — Progress Notes (Signed)
  Patient: Caius Silbernagel Male    DOB: December 22, 1965   53 y.o.   MRN: [id number] Visit Date: 07/11/2018  Today's Provider: [facility]   No chief complaint on file.  Subjective:    HPI   Telephonic visit   Started on Lisinopril 10 mg on last visit due to HTN.  Taking as directed.  Taking BP periodically at home.    No Known Allergies Previous Medications   FAMOTIDINE (PEPCID) 20 MG TABLET    Take 1 tablet (20 mg total) by mouth 2 (two) times daily.   LEVOTHYROXINE (SYNTHROID, LEVOTHROID) 200 MCG TABLET    Take 1 tablet (200 mcg total) by mouth daily before breakfast.   LISINOPRIL (PRINIVIL,ZESTRIL) 10 MG TABLET    Take 1 tablet (10 mg total) by mouth daily.   OMEPRAZOLE (PRILOSEC) 20 MG CAPSULE    Take 1 capsule (20 mg total) by mouth daily.   SIMVASTATIN (ZOCOR) 20 MG TABLET    Take 1 tablet (20 mg total) by mouth daily.    Review of Systems  All other systems reviewed and are negative.   Social History   Tobacco Use  . Smoking status: Current Every Day Smoker    Packs/day: 0.50    Years: 15.00    Pack years: 7.50    Types: Cigarettes  . Smokeless tobacco: Never Used  Substance Use Topics  . Alcohol use: Yes    Alcohol/week: 12.0 standard drinks    Types: 12 Cans of beer per week    Comment: Social Drinker   Objective:   There were no vitals taken for this visit.  Physical Exam  No PE     Assessment & Plan:         HTN:    Goal BP <140/90.  Continue current medication regimen.  Encourage low salt diet and exercise.  FU in 3 months for BP and BMET.  [facility]

## 2018-07-22 ENCOUNTER — Ambulatory Visit: Payer: Self-pay | Admitting: Pharmacist

## 2018-07-22 ENCOUNTER — Other Ambulatory Visit: Payer: Self-pay

## 2018-07-22 DIAGNOSIS — Z79899 Other long term (current) drug therapy: Secondary | ICD-10-CM

## 2018-07-22 NOTE — Progress Notes (Signed)
[facility] Visit Note  Patient: [name] MRN: [id number] Date of Birth: 1965/04/19 PCP: Staci Acosta, NP   Douglas Bird 53 y.o. male, was called today for his Outreach appointment. Two identifiers were used to verify correct patient.   There were no vitals taken for this visit.  Patient Information   Past Medical History:  Diagnosis Date  . Diverticulitis 2011  . GERD (gastroesophageal reflux disease)   . Hyperlipidemia   . Hypertension   . Thyroid disease       Past Surgical History:  Procedure Laterality Date  . CHOLECYSTECTOMY  June 2013     Family History  Problem Relation Age of Onset  . Stroke Father   . Gout Father   . Aneurysm Father        2006  . Thyroid disease Father   . Epilepsy Father   . Thyroid disease Sister   . Diabetes Paternal Grandfather   . Cancer Mother     New Diagnoses (since last visit):   Family Support: Good             Social History   Substance and Sexual Activity  Alcohol Use Yes  . Alcohol/week: 12.0 standard drinks  . Types: 12 Cans of beer per week   Comment: Social Drinker      Social History   Tobacco Use  Smoking Status Current Every Day Smoker  . Packs/day: 0.50  . Years: 15.00  . Pack years: 7.50  . Types: Cigarettes  Smokeless Tobacco Never Used      Health Maintenance  Topic Date Due  . PNEUMOCOCCAL POLYSACCHARIDE VACCINE AGE 21-64 HIGH RISK  10/10/1966  . FOOT EXAM  10/10/1974  . OPHTHALMOLOGY EXAM  10/10/1974  . HIV Screening  10/10/1979  . TETANUS/TDAP  10/10/1983  . COLONOSCOPY  10/10/2014  . HEMOGLOBIN A1C  04/21/2017  . INFLUENZA VACCINE  10/19/2018    Outpatient Encounter Medications as of 07/22/2018  Medication Sig  . famotidine (PEPCID) 20 MG tablet Take 1 tablet (20 mg total) by mouth 2 (two) times daily.  Marland Kitchen levothyroxine (SYNTHROID, LEVOTHROID) 200 MCG tablet Take 1 tablet (200 mcg total) by mouth daily before breakfast.  . lisinopril  (PRINIVIL,ZESTRIL) 10 MG tablet Take 1 tablet (10 mg total) by mouth daily. (Patient taking differently: Take 10 mg by mouth daily. Pt. Taking 1/2 tab (5mg ) daily.)  . omeprazole (PRILOSEC) 20 MG capsule Take 1 capsule (20 mg total) by mouth daily.  . simvastatin (ZOCOR) 20 MG tablet Take 1 tablet (20 mg total) by mouth daily.   No facility-administered encounter medications on file as of 07/22/2018.      Health Maintenance/Date Completed  Last ED visit: 03/10/17 Last Visit to PCP: 07/11/18 Next Visit to PCP: 10/10/18 Flu Vaccine: 2019   ASSESSMENT:  Medication Adherence and Access: Takes medications as prescribed, uses a pill box. Denies missing any dosages.  Hypertension: Started on lisinopril 10 mg daily in March, BP was 146/93. Patient states that he has significantly cut down on salt intake. He no longer uses salt for cooking. He is using vinegar in the mornings to help lower his BP. States his BP readings are "good" and "almost normal". He was not at home when he took the call, so he did not have his numbers available. States that he is only taking a half tablet daily of the lisinopril.   Hyperlipidemia: Currently on simvastatin. Continues to watch diet. Walks daily.  Labs on 05/16/18: TC = 186 mg/dl; TG =  175 mg/dl; HDL = 38 mg/dl; LDL = 113 mg/dl  GERD: Taking omeprazole 20 mg in the afternoon and famotidine at lunch time when needed. He avoids spicy foods, nuts, and seeds and feels this also helps his GERD.  Thyroid: Taking levothyroxine 200 mcg daily. TSH on 05/16/18 is within normal limits. No issues.  Smoking: Currently smoking 1/2 pack daily. Tying to cut down. Will provide info on the Quitline [place].   PLAN: Follow up with [facility] 10/10/18 Follow up MTM in 1 year  Missi Mcmackin K. Dicky Doe, PharmD [facility] [place] Operations Coordinator [phone]

## 2018-08-22 ENCOUNTER — Ambulatory Visit: Payer: Self-pay | Admitting: Urology

## 2018-08-22 ENCOUNTER — Other Ambulatory Visit: Payer: Self-pay

## 2018-08-22 VITALS — Wt 214.0 lb

## 2018-08-22 DIAGNOSIS — M25561 Pain in right knee: Secondary | ICD-10-CM

## 2018-08-22 MED ORDER — MELOXICAM 7.5 MG PO TABS: 7.5000 mg | ORAL_TABLET | Freq: Every day | ORAL | 3 refills | Status: DC

## 2018-08-22 NOTE — Progress Notes (Signed)
Virtual Visit via Telephone Note  I connected with [name] on 08/22/18 at  7:30 PM EDT by telephone and verified that I am speaking with the correct person using two identifiers.  Location: Patient: [facility]    I discussed the limitations, risks, security and privacy concerns of performing an evaluation and management service by telephone and the availability of in person appointments. I also discussed with the patient that there may be a patient responsible charge related to this service. The patient expressed understanding and agreed to proceed.   History of Present Illness: Right knee pain x 2 weeks, 9/10 pain, no swelling, no redness, ice/heat pads helps, NSAIDS help, getting down and getting up it the worst, no other joints are painful, no fevers/chills    Observations/Objective: He does not sound distressed.  He answers questions appropriately.    Assessment and Plan:  1. Right knee pain Start mobic 7.5 mg daily Obtain inflammatory markers  Follow Up Instructions:  Schedule labs.  Follow pending labs.     I discussed the assessment and treatment plan with the patient. The patient was provided an opportunity to ask questions and all were answered. The patient agreed with the plan and demonstrated an understanding of the instructions.   The patient was advised to call back or seek an in-person evaluation if the symptoms worsen or if the condition fails to improve as anticipated.  I provided 20 minutes of non-face-to-face time during this encounter.   Alithea Lapage, PA-C

## 2018-08-28 ENCOUNTER — Emergency Department
Admission: EM | Admit: 2018-08-28 | Discharge: 2018-08-28 | Disposition: A | Discharge status: Home / Self Care | Payer: Self-pay | Attending: Emergency Medicine | Admitting: Emergency Medicine

## 2018-08-28 ENCOUNTER — Other Ambulatory Visit: Payer: Self-pay

## 2018-08-28 ENCOUNTER — Emergency Department: Payer: Self-pay

## 2018-08-28 ENCOUNTER — Encounter: Payer: Self-pay | Admitting: Emergency Medicine

## 2018-08-28 DIAGNOSIS — Z79899 Other long term (current) drug therapy: Secondary | ICD-10-CM | POA: Insufficient documentation

## 2018-08-28 DIAGNOSIS — I10 Essential (primary) hypertension: Secondary | ICD-10-CM | POA: Insufficient documentation

## 2018-08-28 DIAGNOSIS — M25561 Pain in right knee: Secondary | ICD-10-CM | POA: Insufficient documentation

## 2018-08-28 DIAGNOSIS — E039 Hypothyroidism, unspecified: Secondary | ICD-10-CM | POA: Insufficient documentation

## 2018-08-28 DIAGNOSIS — F17210 Nicotine dependence, cigarettes, uncomplicated: Secondary | ICD-10-CM | POA: Insufficient documentation

## 2018-08-28 LAB — URIC ACID: Uric Acid, Serum: 6.5 mg/dL (ref 3.7–8.6)

## 2018-08-28 LAB — SEDIMENTATION RATE: Sed Rate: 4 mm/hr (ref 0–20)

## 2018-08-28 NOTE — Discharge Instructions (Addendum)
Advised knee supports when kneeling.  Start Mobic as directed.

## 2018-08-28 NOTE — ED Triage Notes (Signed)
C/O right knee pain x 5 weeks.  Started after helping put in a hardwood floor.

## 2018-08-28 NOTE — ED Provider Notes (Signed)
[facility] EMERGENCY DEPARTMENT Provider Note   CSN: [id number] Arrival date & time: 08/28/18  1108    History   Chief Complaint No chief complaint on file.   HPI Douglas Bird is a 53 y.o. male.     HPI Patient complain right knee pain increasing in the past 3 to 5 weeks.  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  Patient stated pain and intimating but is increasing in the past 2 weeks.  Patient states had a teleconference office visit last week and was prescribed anti-inflammatory medication was given mild transient relief.  Patient state pain increases with using a ladder or prolonged kneeling.  Patient rates the pain as a 7/10.  Patient described the pain is "achy".  Patient was advised to have inflammatory marker lab test but has not complied. Past Medical History:  Diagnosis Date  . Diverticulitis 2011  . GERD (gastroesophageal reflux disease)   . Hyperlipidemia   . Hypertension   . Thyroid disease     Patient Active Problem List   Diagnosis Date Noted  . Benign hypertension 05/02/2018  . Obesity (BMI 30.0-34.9) 04/30/2018  . Healthcare maintenance 10/19/2016  . Flu vaccine need 04/20/2016  . GERD (gastroesophageal reflux disease) 04/01/2015  . Gout 04/01/2015  . Mixed hyperlipidemia 10/01/2014  . Hypothyroidism 05/21/2014    Past Surgical History:  Procedure Laterality Date  . CHOLECYSTECTOMY  June 2013        Home Medications    Prior to Admission medications   Medication Sig Start Date End Date Taking? Authorizing Provider  famotidine (PEPCID) 20 MG tablet Take 1 tablet (20 mg total) by mouth 2 (two) times daily. 05/10/17 07/22/18  Tawni Millers, MD  levothyroxine (SYNTHROID, LEVOTHROID) 200 MCG tablet Take 1 tablet (200 mcg total) by mouth daily before breakfast. 05/30/18   Doles-Johnson, Teah, NP  lisinopril (PRINIVIL,ZESTRIL) 10 MG tablet Take 1 tablet (10 mg total) by mouth daily. Patient  taking differently: Take 10 mg by mouth daily. Pt. Taking 1/2 tab (5mg ) daily. 05/30/18   Doles-Johnson, Teah, NP  meloxicam (MOBIC) 7.5 MG tablet Take 1 tablet (7.5 mg total) by mouth daily. 08/22/18   Zara Council A, PA-C  omeprazole (PRILOSEC) 20 MG capsule Take 1 capsule (20 mg total) by mouth daily. 05/30/18   Doles-Johnson, Teah, NP  simvastatin (ZOCOR) 20 MG tablet Take 1 tablet (20 mg total) by mouth daily. 05/30/18   Doles-Johnson, Teah, NP    Family History Family History  Problem Relation Age of Onset  . Stroke Father   . Gout Father   . Aneurysm Father        2006  . Thyroid disease Father   . Epilepsy Father   . Thyroid disease Sister   . Diabetes Paternal Grandfather   . Cancer Mother     Social History Social History   Tobacco Use  . Smoking status: Current Every Day Smoker    Packs/day: 0.50    Years: 15.00    Pack years: 7.50    Types: Cigarettes  . Smokeless tobacco: Never Used  Substance Use Topics  . Alcohol use: Yes    Alcohol/week: 12.0 standard drinks    Types: 12 Cans of beer per week    Comment: Social Drinker  . Drug use: No     Allergies   Patient has no known allergies.   Review of Systems Review of Systems Patient is no acute distress.  Review of systems negative syphilis chief complaint. Physical Exam Updated Vital Signs Wt 97.1 kg   BMI 32.54 kg/m   Physical Exam No acute distress.  Examination of the right knee shows no edema or erythema.  No obvious deformity.  Patient  mild crepitus palpation.  No guarding with palpation.  Patient has full neck range of motion.  ED Treatments / Results  Labs (all labs ordered are listed, but only abnormal results are displayed) Labs Reviewed - No data to display  EKG None  Radiology No results found.  Procedures Procedures (including critical care time)  Medications Ordered in ED Medications - No data to display   Initial Impression / Assessment and Plan / ED Course  I have  reviewed the triage vital signs and the nursing notes.  Pertinent labs & imaging results that were available during my care of the patient were reviewed by me and considered in my medical decision making (see chart for details).        Patient presents with 5 weeks of right knee pain status post prolonged kneeling incident at work.  Differential diagnosis consists of bursitis, gout, or degenerative joint disease.  Final Clinical Impressions(s) / ED Diagnoses   Final diagnoses:  None  Right knee pain.  Discussed negative x-ray and lab results with patient.  Patient given discharge care instructions advised to take Mobic as directed.  Follow-up with PCP.  ED Discharge Orders    None       Hewitt Blade 08/28/18 1311    Arta Silence, MD 08/28/18 [phone]

## 2018-08-28 NOTE — ED Notes (Signed)
Call was made to [employer] and spoke with Benn Moulder, owner, who denied the worker's comp. Claim, stating that the patient did not report an injury on the job. [phone].

## 2018-08-28 NOTE — ED Notes (Signed)
Patient states right knee has bothered him on and off for several weeks. Patient is in Architect and wears knee pads. Patient states he has some relief with OTC knee brace. Patient walks with a limping gait.

## 2018-09-16 NOTE — Telephone Encounter (Signed)
This encounter was created in error - please disregard.

## 2018-09-25 ENCOUNTER — Other Ambulatory Visit: Payer: Self-pay

## 2018-10-03 ENCOUNTER — Other Ambulatory Visit: Payer: Self-pay

## 2018-10-03 DIAGNOSIS — K219 Gastro-esophageal reflux disease without esophagitis: Secondary | ICD-10-CM

## 2018-10-03 DIAGNOSIS — I10 Essential (primary) hypertension: Secondary | ICD-10-CM

## 2018-10-03 DIAGNOSIS — M25561 Pain in right knee: Secondary | ICD-10-CM

## 2018-10-03 MED ORDER — OMEPRAZOLE 20 MG PO CPDR: 20.0000 mg | DELAYED_RELEASE_CAPSULE | Freq: Every day | ORAL | 1 refills | Status: DC

## 2018-10-05 LAB — BASIC METABOLIC PANEL
BUN/Creatinine Ratio: 13 (ref 9–20)
BUN: 13 mg/dL (ref 6–24)
CO2: 24 mmol/L (ref 20–29)
Calcium: 9 mg/dL (ref 8.7–10.2)
Chloride: 102 mmol/L (ref 96–106)
Creatinine, Ser: 0.99 mg/dL (ref 0.76–1.27)
GFR calc Af Amer: 100 mL/min/{1.73_m2} (ref >59)
GFR calc non Af Amer: 87 mL/min/{1.73_m2} (ref >59)
Glucose: 88 mg/dL (ref 65–99)
Potassium: 3.9 mmol/L (ref 3.5–5.2)
Sodium: 141 mmol/L (ref 134–144)

## 2018-10-05 LAB — URIC ACID: Uric Acid: 6.9 mg/dL (ref 3.7–8.6)

## 2018-10-05 LAB — C-REACTIVE PROTEIN: CRP: <1 mg/L (ref 0–10)

## 2018-10-05 LAB — RHEUMATOID FACTOR: Rhuematoid fact SerPl-aCnc: <10 IU/mL (ref 0.0–13.9)

## 2018-10-05 LAB — CYCLIC CITRUL PEPTIDE ANTIBODY, IGG/IGA: Cyclic Citrullin Peptide Ab: 7 units (ref 0–19)

## 2018-10-05 LAB — SEDIMENTATION RATE: Sed Rate: 16 mm/hr (ref 0–30)

## 2018-10-10 ENCOUNTER — Ambulatory Visit: Payer: Self-pay

## 2018-10-24 ENCOUNTER — Ambulatory Visit: Payer: Self-pay | Admitting: Urology

## 2018-10-24 ENCOUNTER — Other Ambulatory Visit: Payer: Self-pay

## 2018-10-24 VITALS — BP 128/90 | HR 78 | Temp 97.5°F | Ht 70.0 in | Wt 211.0 lb

## 2018-10-24 DIAGNOSIS — I10 Essential (primary) hypertension: Secondary | ICD-10-CM

## 2018-10-24 MED ORDER — SILDENAFIL CITRATE 100 MG PO TABS: 100.0000 mg | ORAL_TABLET | Freq: Every day | ORAL | 0 refills | Status: DC | PRN

## 2018-10-24 NOTE — Progress Notes (Signed)
  Patient: Douglas Bird Male    DOB: 03-16-1966   53 y.o.   MRN: [id number] Visit Date: 10/24/2018  Today's Provider: [facility]   Chief Complaint  Patient presents with  . Follow-up   Subjective:    HPI  Knee pain has resolved.     Complaint of ED   No Known Allergies Previous Medications   FAMOTIDINE (PEPCID) 20 MG TABLET    Take 1 tablet (20 mg total) by mouth 2 (two) times daily.   LEVOTHYROXINE (SYNTHROID, LEVOTHROID) 200 MCG TABLET    Take 1 tablet (200 mcg total) by mouth daily before breakfast.   LISINOPRIL (PRINIVIL,ZESTRIL) 10 MG TABLET    Take 1 tablet (10 mg total) by mouth daily.   MELOXICAM (MOBIC) 7.5 MG TABLET    Take 1 tablet (7.5 mg total) by mouth daily.   OMEPRAZOLE (PRILOSEC) 20 MG CAPSULE    Take 1 capsule (20 mg total) by mouth daily.   SIMVASTATIN (ZOCOR) 20 MG TABLET    Take 1 tablet (20 mg total) by mouth daily.    Review of Systems  Social History   Tobacco Use  . Smoking status: Current Every Day Smoker    Packs/day: 0.50    Years: 15.00    Pack years: 7.50    Types: Cigarettes  . Smokeless tobacco: Never Used  Substance Use Topics  . Alcohol use: Yes    Alcohol/week: 12.0 standard drinks    Types: 12 Cans of beer per week    Comment: Social Drinker   Objective:   BP 128/90   Pulse 78   Temp (!) 97.5 F (36.4 C)   Ht 5\' 10"  (1.778 m)   Wt 211 lb (95.7 kg)   SpO2 97%   BMI 30.28 kg/m   Physical Exam      Assessment & Plan:   1. ED  - given script for Viagra  2. HTN  - good control  3. Right knee pain  - resolved  4. Hypothyroidism  - good contol       [facility]

## 2018-10-31 ENCOUNTER — Telehealth: Payer: Self-pay | Admitting: Pharmacy Technician

## 2018-10-31 NOTE — Telephone Encounter (Signed)
Received 2020 proof of income.  Patient eligible to receive medication assistance at [facility] as long as eligibility requirements continue to be met.  [facility]

## 2019-02-17 ENCOUNTER — Other Ambulatory Visit: Payer: Self-pay | Admitting: Gerontology

## 2019-03-25 ENCOUNTER — Other Ambulatory Visit: Payer: Self-pay | Admitting: Gerontology

## 2019-03-25 DIAGNOSIS — K219 Gastro-esophageal reflux disease without esophagitis: Secondary | ICD-10-CM

## 2019-03-27 ENCOUNTER — Other Ambulatory Visit: Payer: Self-pay

## 2019-03-27 ENCOUNTER — Ambulatory Visit: Payer: Self-pay | Admitting: Gerontology

## 2019-03-27 VITALS — BP 156/94 | Ht 69.0 in | Wt 213.0 lb

## 2019-03-27 DIAGNOSIS — M545 Low back pain, unspecified: Secondary | ICD-10-CM

## 2019-03-27 DIAGNOSIS — M549 Dorsalgia, unspecified: Secondary | ICD-10-CM | POA: Insufficient documentation

## 2019-03-27 DIAGNOSIS — Z0000 Encounter for general adult medical examination without abnormal findings: Secondary | ICD-10-CM

## 2019-03-27 NOTE — Patient Instructions (Signed)

## 2019-03-27 NOTE — Progress Notes (Signed)
Established Patient Office Visit  Subjective:  Patient ID: Douglas Bird, male    DOB: 07/21/1965  Age: 54 y.o. MRN: [id number]  CC:  Chief Complaint  Patient presents with  . Back Pain    HPI Douglas Bird presents for complaint of lower back pain. He states that the back pain started after standing on a ladder working on a goat house last Saturday. He denies any fall or trauma. He states that currently he's feeling better and only experiencing intermittent 6/10 non radiating pain to his lower back. He states that using heating pad, ice pack, massage mat and taking 400 mg Ibuprofen twice daily relieves pain. He denies bowel or bladder incontinence and saddle anesthesia. Overall he states that he's doing well and offers no further complaint.  Past Medical History:  Diagnosis Date  . Diverticulitis 2011  . GERD (gastroesophageal reflux disease)   . Hyperlipidemia   . Hypertension   . Thyroid disease     Past Surgical History:  Procedure Laterality Date  . CHOLECYSTECTOMY  June 2013    Family History  Problem Relation Age of Onset  . Stroke Father   . Gout Father   . Aneurysm Father        2006  . Thyroid disease Father   . Epilepsy Father   . Thyroid disease Sister   . Diabetes Paternal Grandfather   . Cancer Mother     Social History   Socioeconomic History  . Marital status: Significant Other    Spouse name: Not on file  . Number of children: Not on file  . Years of education: Not on file  . Highest education level: Not on file  Occupational History  . Not on file  Tobacco Use  . Smoking status: Current Every Day Smoker    Packs/day: 0.50    Years: 15.00    Pack years: 7.50    Types: Cigarettes  . Smokeless tobacco: Never Used  Substance and Sexual Activity  . Alcohol use: Yes    Alcohol/week: 12.0 standard drinks    Types: 12 Cans of beer per week    Comment: Social Drinker  . Drug use: No  . Sexual activity: Not Currently  Other Topics Concern  .  Not on file  Social History Narrative  . Not on file   Social Determinants of Health   Financial Resource Strain:   . Difficulty of Paying Living Expenses: Not on file  Food Insecurity:   . Worried About Charity fundraiser in the Last Year: Not on file  . Ran Out of Food in the Last Year: Not on file  Transportation Needs:   . Lack of Transportation (Medical): Not on file  . Lack of Transportation (Non-Medical): Not on file  Physical Activity:   . Days of Exercise per Week: Not on file  . Minutes of Exercise per Session: Not on file  Stress:   . Feeling of Stress : Not on file  Social Connections:   . Frequency of Communication with Friends and Family: Not on file  . Frequency of Social Gatherings with Friends and Family: Not on file  . Attends Religious Services: Not on file  . Active Member of Clubs or Organizations: Not on file  . Attends Archivist Meetings: Not on file  . Marital Status: Not on file  Intimate Partner Violence:   . Fear of Current or Ex-Partner: Not on file  . Emotionally Abused: Not on file  . Physically  Abused: Not on file  . Sexually Abused: Not on file    Outpatient Medications Prior to Visit  Medication Sig Dispense Refill  . levothyroxine (SYNTHROID, LEVOTHROID) 200 MCG tablet Take 1 tablet (200 mcg total) by mouth daily before breakfast. 30 tablet 12  . lisinopril (ZESTRIL) 10 MG tablet TAKE ONE TABLET BY MOUTH EVERY DAY 30 tablet 0  . omeprazole (PRILOSEC) 20 MG capsule Take 1 capsule (20 mg total) by mouth daily. 90 capsule 1  . simvastatin (ZOCOR) 20 MG tablet Take 1 tablet (20 mg total) by mouth daily. 90 tablet 3  . meloxicam (MOBIC) 7.5 MG tablet Take 1 tablet (7.5 mg total) by mouth daily. (Patient not taking: Reported on 03/27/2019) 30 tablet 3  . sildenafil (VIAGRA) 100 MG tablet Take 1 tablet (100 mg total) by mouth daily as needed for erectile dysfunction. 10 tablet 0  . famotidine (PEPCID) 20 MG tablet Take 1 tablet (20 mg  total) by mouth 2 (two) times daily. 60 tablet 2   No facility-administered medications prior to visit.    No Known Allergies  ROS Review of Systems  Constitutional: Negative.   Respiratory: Negative.   Cardiovascular: Negative.   Musculoskeletal: Positive for back pain (acute back pain).  Neurological: Negative.   Psychiatric/Behavioral: Negative.       Objective:    Physical Exam  Constitutional: He is oriented to person, place, and time. He appears well-developed.  HENT:  Head: Normocephalic and atraumatic.  Cardiovascular: Normal rate and regular rhythm.  Pulmonary/Chest: Effort normal and breath sounds normal.  Abdominal: Soft. Bowel sounds are normal.  Musculoskeletal:     Lumbar back: Tenderness (On palpation) present. No swelling, edema, deformity or lacerations.  Neurological: He is alert and oriented to person, place, and time.  Psychiatric: He has a normal mood and affect. His behavior is normal. Judgment and thought content normal.    BP (!) 156/94 (BP Location: Left Arm, Patient Position: Sitting)   Ht 5\' 9"  (1.753 m)   Wt 213 lb (96.6 kg)   BMI 31.45 kg/m  Wt Readings from Last 3 Encounters:  03/27/19 213 lb (96.6 kg)  10/24/18 211 lb (95.7 kg)  08/28/18 214 lb (97.1 kg)   He was encouraged to continue on his weight loss regimen.  Health Maintenance Due  Topic Date Due  . PNEUMOCOCCAL POLYSACCHARIDE VACCINE AGE 22-64 HIGH RISK  10/10/1966  . FOOT EXAM  10/10/1974  . OPHTHALMOLOGY EXAM  10/10/1974  . HIV Screening  10/10/1979  . TETANUS/TDAP  10/10/1983  . COLONOSCOPY  10/10/2014  . HEMOGLOBIN A1C  04/21/2017    There are no preventive care reminders to display for this patient.  Lab Results  Component Value Date   TSH 0.909 05/16/2018   Lab Results  Component Value Date   WBC 6.9 11/01/2017   HGB 14.7 11/01/2017   HCT 44.3 11/01/2017   MCV 93 11/01/2017   PLT 179 11/01/2017   Lab Results  Component Value Date   NA 141 10/03/2018    K 3.9 10/03/2018   CO2 24 10/03/2018   GLUCOSE 88 10/03/2018   BUN 13 10/03/2018   CREATININE 0.99 10/03/2018   BILITOT 0.4 05/16/2018   ALKPHOS 87 05/16/2018   AST 18 05/16/2018   ALT 20 05/16/2018   PROT 6.5 05/16/2018   ALBUMIN 4.3 05/16/2018   CALCIUM 9.0 10/03/2018   ANIONGAP 12 03/10/2017   Lab Results  Component Value Date   CHOL 186 05/16/2018   Lab Results  Component Value Date   HDL 38 (L) 05/16/2018   Lab Results  Component Value Date   LDLCALC 113 (H) 05/16/2018   Lab Results  Component Value Date   TRIG 175 (H) 05/16/2018   Lab Results  Component Value Date   CHOLHDL 4.9 05/16/2018   Lab Results  Component Value Date   HGBA1C 5.3 10/19/2016      Assessment & Plan:   1. Acute bilateral low back pain, unspecified whether sciatica present - His back pain is likely due to muscle strain and he was advised to continue on Ibuprofen 400 mg bid for the next 10 days. He was advised to go to the ED with worsening symptoms.  2. Healthcare maintenance - Routine labs will be checked. - Lipid panel; Future - HgB A1c; Future - TSH; Future - Ambulatory referral to Gastroenterology for Colonoscopy screening.     Follow-up: Return in about 5 weeks (around 05/01/2019), or if symptoms worsen or fail to improve.    Lorin Gawron Jerold Coombe, NP

## 2019-03-28 ENCOUNTER — Other Ambulatory Visit: Payer: Self-pay | Admitting: Gerontology

## 2019-03-28 ENCOUNTER — Telehealth: Payer: Self-pay | Admitting: Gastroenterology

## 2019-03-28 DIAGNOSIS — K219 Gastro-esophageal reflux disease without esophagitis: Secondary | ICD-10-CM

## 2019-03-28 NOTE — Telephone Encounter (Signed)
Patient was returning call to schedule colonoscopy.

## 2019-03-31 NOTE — Telephone Encounter (Signed)
Gastroenterology Pre-Procedure Review  Request Date: Monday 04/28/19 Requesting Physician: Dr. Allen Norris  PATIENT REVIEW QUESTIONS: The patient responded to the following health history questions as indicated:    1. Are you having any GI issues? no 2. Do you have a personal history of Polyps? no 3. Do you have a family history of Colon Cancer or Polyps? no 4. Diabetes Mellitus? no 5. Joint replacements in the past 12 months?no 6. Major health problems in the past 3 months?no 7. Any artificial heart valves, MVP, or defibrillator?no    MEDICATIONS & ALLERGIES:    Patient reports the following regarding taking any anticoagulation/antiplatelet therapy:   Plavix, Coumadin, Eliquis, Xarelto, Lovenox, Pradaxa, Brilinta, or Effient? no Aspirin? no  Patient confirms/reports the following medications:  Current Outpatient Medications  Medication Sig Dispense Refill  . levothyroxine (SYNTHROID, LEVOTHROID) 200 MCG tablet Take 1 tablet (200 mcg total) by mouth daily before breakfast. 30 tablet 12  . lisinopril (ZESTRIL) 10 MG tablet TAKE ONE TABLET BY MOUTH EVERY DAY 30 tablet 0  . meloxicam (MOBIC) 7.5 MG tablet Take 1 tablet (7.5 mg total) by mouth daily. (Patient not taking: Reported on 03/27/2019) 30 tablet 3  . omeprazole (PRILOSEC) 20 MG capsule Take 1 capsule (20 mg total) by mouth daily. 90 capsule 1  . sildenafil (VIAGRA) 100 MG tablet Take 1 tablet (100 mg total) by mouth daily as needed for erectile dysfunction. 10 tablet 0  . simvastatin (ZOCOR) 20 MG tablet Take 1 tablet (20 mg total) by mouth daily. 90 tablet 3   No current facility-administered medications for this visit.    Patient confirms/reports the following allergies:  No Known Allergies  No orders of the defined types were placed in this encounter.   AUTHORIZATION INFORMATION Primary Insurance: 1D#: Group #:  Secondary Insurance: 1D#: Group #:  SCHEDULE INFORMATION: Date: Monday 04/28/19 Time: Location:[facility]

## 2019-04-01 ENCOUNTER — Other Ambulatory Visit: Payer: Self-pay | Admitting: Gerontology

## 2019-04-01 DIAGNOSIS — K219 Gastro-esophageal reflux disease without esophagitis: Secondary | ICD-10-CM

## 2019-04-24 ENCOUNTER — Other Ambulatory Visit: Payer: Self-pay

## 2019-04-24 ENCOUNTER — Encounter: Payer: Self-pay | Admitting: Gastroenterology

## 2019-04-24 ENCOUNTER — Other Ambulatory Visit
Admission: RE | Admit: 2019-04-24 | Discharge: 2019-04-24 | Disposition: A | Discharge status: Home / Self Care | Payer: HRSA Program | Source: Ambulatory Visit | Attending: Gastroenterology | Admitting: Gastroenterology

## 2019-04-24 DIAGNOSIS — Z01812 Encounter for preprocedural laboratory examination: Secondary | ICD-10-CM | POA: Diagnosis present

## 2019-04-24 DIAGNOSIS — Z0000 Encounter for general adult medical examination without abnormal findings: Secondary | ICD-10-CM

## 2019-04-24 DIAGNOSIS — Z20822 Contact with and (suspected) exposure to covid-19: Secondary | ICD-10-CM | POA: Insufficient documentation

## 2019-04-24 DIAGNOSIS — Z1211 Encounter for screening for malignant neoplasm of colon: Secondary | ICD-10-CM

## 2019-04-24 LAB — SARS CORONAVIRUS 2 (TAT 6-24 HRS): SARS Coronavirus 2: NEGATIVE

## 2019-04-25 LAB — TSH: TSH: 0.237 u[IU]/mL — ABNORMAL LOW (ref 0.450–4.500)

## 2019-04-25 LAB — LIPID PANEL
Chol/HDL Ratio: 4.6 ratio (ref 0.0–5.0)
Cholesterol, Total: 185 mg/dL (ref 100–199)
HDL: 40 mg/dL (ref >39)
LDL Chol Calc (NIH): 124 mg/dL — ABNORMAL HIGH (ref 0–99)
Triglycerides: 117 mg/dL (ref 0–149)
VLDL Cholesterol Cal: 21 mg/dL (ref 5–40)

## 2019-04-25 LAB — HEMOGLOBIN A1C
Est. average glucose Bld gHb Est-mCnc: 111 mg/dL
Hgb A1c MFr Bld: 5.5 % (ref 4.8–5.6)

## 2019-04-28 ENCOUNTER — Ambulatory Visit: Payer: Self-pay | Admitting: Anesthesiology

## 2019-04-28 ENCOUNTER — Ambulatory Visit
Admission: RE | Admit: 2019-04-28 | Discharge: 2019-04-28 | Disposition: A | Discharge status: Home / Self Care | Payer: Self-pay | Attending: Gastroenterology | Admitting: Gastroenterology

## 2019-04-28 ENCOUNTER — Other Ambulatory Visit: Payer: Self-pay

## 2019-04-28 ENCOUNTER — Encounter
Admission: RE | Disposition: A | Discharge status: Home / Self Care | Payer: Self-pay | Source: Home / Self Care | Attending: Gastroenterology

## 2019-04-28 ENCOUNTER — Encounter: Payer: Self-pay | Admitting: Gastroenterology

## 2019-04-28 DIAGNOSIS — F17210 Nicotine dependence, cigarettes, uncomplicated: Secondary | ICD-10-CM | POA: Insufficient documentation

## 2019-04-28 DIAGNOSIS — E785 Hyperlipidemia, unspecified: Secondary | ICD-10-CM | POA: Insufficient documentation

## 2019-04-28 DIAGNOSIS — I10 Essential (primary) hypertension: Secondary | ICD-10-CM | POA: Insufficient documentation

## 2019-04-28 DIAGNOSIS — E039 Hypothyroidism, unspecified: Secondary | ICD-10-CM | POA: Insufficient documentation

## 2019-04-28 DIAGNOSIS — K5730 Diverticulosis of large intestine without perforation or abscess without bleeding: Secondary | ICD-10-CM | POA: Insufficient documentation

## 2019-04-28 DIAGNOSIS — K644 Residual hemorrhoidal skin tags: Secondary | ICD-10-CM | POA: Insufficient documentation

## 2019-04-28 DIAGNOSIS — Z79899 Other long term (current) drug therapy: Secondary | ICD-10-CM | POA: Insufficient documentation

## 2019-04-28 DIAGNOSIS — K219 Gastro-esophageal reflux disease without esophagitis: Secondary | ICD-10-CM | POA: Insufficient documentation

## 2019-04-28 DIAGNOSIS — Z1211 Encounter for screening for malignant neoplasm of colon: Secondary | ICD-10-CM | POA: Insufficient documentation

## 2019-04-28 HISTORY — DX: Hypothyroidism, unspecified: E03.9

## 2019-04-28 HISTORY — PX: COLONOSCOPY WITH PROPOFOL: SHX5780

## 2019-04-28 SURGERY — COLONOSCOPY WITH PROPOFOL
Anesthesia: General | Site: Rectum

## 2019-04-28 MED ORDER — PROPOFOL 10 MG/ML IV BOLUS
INTRAVENOUS | Status: DC | PRN
  Administered 2019-04-28: 40 mg via INTRAVENOUS
  Administered 2019-04-28: 20 mg via INTRAVENOUS
  Administered 2019-04-28: 40 mg via INTRAVENOUS
  Administered 2019-04-28: 50 mg via INTRAVENOUS
  Administered 2019-04-28: 150 mg via INTRAVENOUS
  Administered 2019-04-28: 50 mg via INTRAVENOUS

## 2019-04-28 MED ORDER — HYDROCORTISONE (PERIANAL) 2.5 % EX CREA: TOPICAL_CREAM | Freq: Three times a day (TID) | CUTANEOUS | 1 refills | Status: AC

## 2019-04-28 MED ORDER — ONDANSETRON HCL 4 MG/2ML IJ SOLN: 4.0000 mg | Freq: Once | INTRAMUSCULAR | Status: DC | PRN

## 2019-04-28 MED ORDER — SODIUM CHLORIDE 0.9 % IV SOLN: INTRAVENOUS | Status: DC

## 2019-04-28 MED ORDER — LACTATED RINGERS IV SOLN
100.0000 mL/h | INTRAVENOUS | Status: DC
  Administered 2019-04-28: 100 mL/h via INTRAVENOUS

## 2019-04-28 MED ORDER — LIDOCAINE HCL URETHRAL/MUCOSAL 2 % EX GEL
CUTANEOUS | Status: DC | PRN
  Administered 2019-04-28: 1 via TOPICAL

## 2019-04-28 MED ORDER — LIDOCAINE HCL (CARDIAC) PF 100 MG/5ML IV SOSY
PREFILLED_SYRINGE | INTRAVENOUS | Status: DC | PRN
  Administered 2019-04-28: 30 mg via INTRAVENOUS

## 2019-04-28 MED ORDER — STERILE WATER FOR IRRIGATION IR SOLN
Status: DC | PRN
  Administered 2019-04-28: .05 mL

## 2019-04-28 SURGICAL SUPPLY — 25 items
CANISTER SUCT 1200ML W/VALVE (MISCELLANEOUS) ×3 IMPLANT
CLIP HMST 235XBRD CATH ROT (MISCELLANEOUS) IMPLANT
CLIP RESOLUTION 360 11X235 (MISCELLANEOUS)
ELECT REM PT RETURN 9FT ADLT (ELECTROSURGICAL)
ELECTRODE REM PT RTRN 9FT ADLT (ELECTROSURGICAL) IMPLANT
FCP ESCP3.2XJMB 240X2.8X (MISCELLANEOUS)
FORCEPS BIOP RAD 4 LRG CAP 4 (CUTTING FORCEPS) IMPLANT
FORCEPS BIOP RJ4 240 W/NDL (MISCELLANEOUS)
FORCEPS ESCP3.2XJMB 240X2.8X (MISCELLANEOUS) IMPLANT
GOWN CVR UNV OPN BCK APRN NK (MISCELLANEOUS) ×2 IMPLANT
GOWN ISOL THUMB LOOP REG UNIV (MISCELLANEOUS) ×4
INJECTOR VARIJECT VIN23 (MISCELLANEOUS) IMPLANT
KIT DEFENDO VALVE AND CONN (KITS) IMPLANT
KIT ENDO PROCEDURE OLY (KITS) ×3 IMPLANT
MARKER SPOT ENDO TATTOO 5ML (MISCELLANEOUS) IMPLANT
PROBE APC STR FIRE (PROBE) IMPLANT
RETRIEVER NET ROTH 2.5X230 LF (MISCELLANEOUS) IMPLANT
SNARE COLD EXACTO (MISCELLANEOUS) IMPLANT
SNARE SHORT THROW 13M SML OVAL (MISCELLANEOUS) IMPLANT
SNARE SHORT THROW 30M LRG OVAL (MISCELLANEOUS) IMPLANT
SNARE SNG USE RND 15MM (INSTRUMENTS) IMPLANT
SPOT EX ENDOSCOPIC TATTOO (MISCELLANEOUS)
TRAP ETRAP POLY (MISCELLANEOUS) IMPLANT
VARIJECT INJECTOR VIN23 (MISCELLANEOUS)
WATER STERILE IRR 250ML POUR (IV SOLUTION) ×3 IMPLANT

## 2019-04-28 NOTE — Anesthesia Procedure Notes (Signed)
Date/Time: 04/28/2019 11:03 AM Performed by: Cameron Ali, CRNA Pre-anesthesia Checklist: Patient identified, Emergency Drugs available, Suction available, Timeout performed and Patient being monitored Patient Re-evaluated:Patient Re-evaluated prior to induction Oxygen Delivery Method: Nasal cannula Placement Confirmation: positive ETCO2

## 2019-04-28 NOTE — H&P (Signed)
Cephas Darby, MD [address]  [place]  [place], [place] [address]  Main: [phone]  Fax: [phone] Pager: [phone]  Primary Care Physician:  Staci Acosta, NP Primary Gastroenterologist:  Dr. Cephas Darby  Pre-Procedure History & Physical: HPI:  Douglas Bird is a 54 y.o. male is here for an colonoscopy.   Past Medical History:  Diagnosis Date  . Diverticulitis 2011  . GERD (gastroesophageal reflux disease)   . Hyperlipidemia   . Hypertension    controlled on meds  . Hypothyroidism   . Thyroid disease     Past Surgical History:  Procedure Laterality Date  . CHOLECYSTECTOMY  June 2013    Prior to Admission medications   Medication Sig Start Date End Date Taking? Authorizing Provider  levothyroxine (SYNTHROID, LEVOTHROID) 200 MCG tablet Take 1 tablet (200 mcg total) by mouth daily before breakfast. 05/30/18  Yes Doles-Johnson, Teah, NP  lisinopril (ZESTRIL) 10 MG tablet TAKE ONE TABLET BY MOUTH EVERY DAY 02/18/19  Yes McGowan, Larene Beach A, PA-C  omeprazole (PRILOSEC) 20 MG capsule TAKE ONE CAPSULE BY MOUTH EVERY DAY 04/01/19  Yes Iloabachie, Chioma E, NP  simvastatin (ZOCOR) 20 MG tablet Take 1 tablet (20 mg total) by mouth daily. 05/30/18  Yes Doles-Johnson, Teah, NP  meloxicam (MOBIC) 7.5 MG tablet Take 1 tablet (7.5 mg total) by mouth daily. Patient not taking: Reported on 03/27/2019 08/22/18   Zara Council A, PA-C  sildenafil (VIAGRA) 100 MG tablet Take 1 tablet (100 mg total) by mouth daily as needed for erectile dysfunction. Patient not taking: Reported on 04/24/2019 10/24/18   Zara Council A, PA-C    Allergies as of 04/24/2019  . (No Known Allergies)    Family History  Problem Relation Age of Onset  . Stroke Father   . Gout Father   . Aneurysm Father        2006  . Thyroid disease Father   . Epilepsy Father   . Thyroid disease Sister   . Diabetes Paternal Grandfather   . Cancer Mother     Social History   Socioeconomic History    . Marital status: Significant Other    Spouse name: Not on file  . Number of children: Not on file  . Years of education: Not on file  . Highest education level: Not on file  Occupational History  . Not on file  Tobacco Use  . Smoking status: Current Every Day Smoker    Packs/day: 0.50    Years: 15.00    Pack years: 7.50    Types: Cigarettes  . Smokeless tobacco: Former Network engineer and Sexual Activity  . Alcohol use: Yes    Alcohol/week: 12.0 standard drinks    Types: 12 Cans of beer per week    Comment: Social Drinker  . Drug use: No  . Sexual activity: Not Currently  Other Topics Concern  . Not on file  Social History Narrative  . Not on file   Social Determinants of Health   Financial Resource Strain:   . Difficulty of Paying Living Expenses: Not on file  Food Insecurity:   . Worried About Charity fundraiser in the Last Year: Not on file  . Ran Out of Food in the Last Year: Not on file  Transportation Needs:   . Lack of Transportation (Medical): Not on file  . Lack of Transportation (Non-Medical): Not on file  Physical Activity:   . Days of Exercise per Week: Not on file  .  Minutes of Exercise per Session: Not on file  Stress:   . Feeling of Stress : Not on file  Social Connections:   . Frequency of Communication with Friends and Family: Not on file  . Frequency of Social Gatherings with Friends and Family: Not on file  . Attends Religious Services: Not on file  . Active Member of Clubs or Organizations: Not on file  . Attends Archivist Meetings: Not on file  . Marital Status: Not on file  Intimate Partner Violence:   . Fear of Current or Ex-Partner: Not on file  . Emotionally Abused: Not on file  . Physically Abused: Not on file  . Sexually Abused: Not on file    Review of Systems: See HPI, otherwise negative ROS  Physical Exam: BP 124/73   Pulse 67   Temp (!) 97.3 F (36.3 C) (Temporal)   Ht 5\' 9"  (1.753 m)   Wt 92.5 kg   SpO2  97%   BMI 30.13 kg/m  General:   Alert,  pleasant and cooperative in NAD Head:  Normocephalic and atraumatic. Neck:  Supple; no masses or thyromegaly. Lungs:  Clear throughout to auscultation.    Heart:  Regular rate and rhythm. Abdomen:  Soft, nontender and nondistended. Normal bowel sounds, without guarding, and without rebound.   Neurologic:  Alert and  oriented x4;  grossly normal neurologically.  Impression/Plan: Mandip Frame is here for an colonoscopy to be performed for colon cancer screening  Risks, benefits, limitations, and alternatives regarding  colonoscopy have been reviewed with the patient.  Questions have been answered.  All parties agreeable.   Sherri Sear, MD  04/28/2019, 10:24 AM

## 2019-04-28 NOTE — Op Note (Signed)
[facility] Gastroenterology Patient Name: Douglas Bird Procedure Date: 04/28/2019 10:55 AM MRN: [id number] Account #: [id number] Date of Birth: 1966-01-29 Admit Type: Outpatient Age: 54 Room: [facility] OR ROOM 01 Gender: Male Note Status: Finalized Procedure:             Colonoscopy Indications:           Screening for colorectal malignant neoplasm, Last                         colonoscopy 10 years ago Providers:             Lin Landsman MD, MD Medicines:             Monitored Anesthesia Care Complications:         No immediate complications. Estimated blood loss: None. Procedure:             Pre-Anesthesia Assessment:                        - Prior to the procedure, a History and Physical was                         performed, and patient medications and allergies were                         reviewed. The patient is competent. The risks and                         benefits of the procedure and the sedation options and                         risks were discussed with the patient. All questions                         were answered and informed consent was obtained.                         Patient identification and proposed procedure were                         verified by the physician, the nurse, the                         anesthesiologist, the anesthetist and the technician                         in the pre-procedure area in the procedure room in the                         endoscopy suite. Mental Status Examination: alert and                         oriented. Airway Examination: normal oropharyngeal                         airway and neck mobility. Respiratory Examination:                         clear to auscultation. CV Examination: normal.  Prophylactic Antibiotics: The patient does not require                         prophylactic antibiotics. Prior Anticoagulants: The                         patient has taken no previous  anticoagulant or                         antiplatelet agents. ASA Grade Assessment: II - A                         patient with mild systemic disease. After reviewing                         the risks and benefits, the patient was deemed in                         satisfactory condition to undergo the procedure. The                         anesthesia plan was to use monitored anesthesia care                         (MAC). Immediately prior to administration of                         medications, the patient was re-assessed for adequacy                         to receive sedatives. The heart rate, respiratory                         rate, oxygen saturations, blood pressure, adequacy of                         pulmonary ventilation, and response to care were                         monitored throughout the procedure. The physical                         status of the patient was re-assessed after the                         procedure.                        After obtaining informed consent, the colonoscope was                         passed under direct vision. Throughout the procedure,                         the patient's blood pressure, pulse, and oxygen                         saturations were monitored continuously. The was  introduced through the anus and advanced to the the                         cecum, identified by appendiceal orifice and ileocecal                         valve. The colonoscopy was performed with moderate                         difficulty due to a tortuous colon. Successful                         completion of the procedure was aided by applying                         abdominal pressure. The patient tolerated the                         procedure fairly well. The quality of the bowel                         preparation was evaluated using the BBPS [facility] Bowel                         Preparation Scale) with scores of: Right Colon = 3,                          Transverse Colon = 3 and Left Colon = 3 (entire mucosa                         seen well with no residual staining, small fragments                         of stool or opaque liquid). The total BBPS score                         equals 9. Findings:      The perianal exam findings include anal canal lesion, likely inflammed       external hemorrhoid.      A few diverticula were found in the sigmoid colon.      Oozing external hemorrhoids were found during retroflexion, during       perianal exam and during endoscopy. The hemorrhoids were large and       inflammed.      The exam was otherwise without abnormality. Impression:            - Anal canal lesion found on perianal exam.                        - Diverticulosis in the sigmoid colon.                        - Bleeding external hemorrhoids.                        - The examination was otherwise normal.                        -  No specimens collected. Recommendation:        - Discharge patient to home (with escort).                        - Resume previous diet today.                        - Continue present medications.                        - Repeat colonoscopy in 10 years for surveillance.                        - Return to my office in 2 weeks.                        - Recommed anusl rectal cream BID for 2weeks                        - Refer to gen surgery if the lesion is not healing in                         2 weeks Procedure Code(s):     --- Professional ---                        L4103, Colorectal cancer screening; colonoscopy on                         individual not meeting criteria for high risk Diagnosis Code(s):     --- Professional ---                        Z12.11, Encounter for screening for malignant neoplasm                         of colon                        K64.4, Residual hemorrhoidal skin tags                        K57.30, Diverticulosis of large intestine without                          perforation or abscess without bleeding CPT copyright 2019 American Medical Association. All rights reserved. The codes documented in this report are preliminary and upon coder review may  be revised to meet current compliance requirements. Dr. Ulyess Mort Lin Landsman MD, MD 04/28/2019 11:24:11 AM This report has been signed electronically. Number of Addenda: 0 Note Initiated On: 04/28/2019 10:55 AM Scope Withdrawal Time: 0 hours 7 minutes 17 seconds  Total Procedure Duration: 0 hours 10 minutes 17 seconds  Estimated Blood Loss:  Estimated blood loss: none.      [facility]

## 2019-04-28 NOTE — Transfer of Care (Signed)
Immediate Anesthesia Transfer of Care Note  Patient: Douglas Bird  Procedure(s) Performed: COLONOSCOPY WITH PROPOFOL (N/A Rectum)  Patient Location: PACU  Anesthesia Type: General  Level of Consciousness: awake, alert  and patient cooperative  Airway and Oxygen Therapy: Patient Spontanous Breathing and Patient connected to supplemental oxygen  Post-op Assessment: Post-op Vital signs reviewed, Patient's Cardiovascular Status Stable, Respiratory Function Stable, Patent Airway and No signs of Nausea or vomiting  Post-op Vital Signs: Reviewed and stable  Complications: No apparent anesthesia complications

## 2019-04-28 NOTE — Anesthesia Postprocedure Evaluation (Signed)
Anesthesia Post Note  Patient: Douglas Bird  Procedure(s) Performed: COLONOSCOPY WITH PROPOFOL (N/A Rectum)     Patient location during evaluation: PACU Anesthesia Type: General Level of consciousness: awake and alert Pain management: pain level controlled Vital Signs Assessment: post-procedure vital signs reviewed and stable Respiratory status: spontaneous breathing, nonlabored ventilation, respiratory function stable and patient connected to nasal cannula oxygen Cardiovascular status: blood pressure returned to baseline and stable Postop Assessment: no apparent nausea or vomiting Anesthetic complications: no    Alisa Graff

## 2019-04-28 NOTE — Anesthesia Preprocedure Evaluation (Signed)
Anesthesia Evaluation  Patient identified by MRN, date of birth, ID band Patient awake    Reviewed: Allergy & Precautions, H&P , NPO status , Patient's Chart, lab work & pertinent test results, reviewed documented beta blocker date and time   Airway Mallampati: II  TM Distance: >3 FB Neck ROM: full    Dental no notable dental hx.    Pulmonary neg pulmonary ROS, Current Smoker,    Pulmonary exam normal breath sounds clear to auscultation       Cardiovascular Exercise Tolerance: Good hypertension,  Rhythm:regular Rate:Normal     Neuro/Psych negative neurological ROS  negative psych ROS   GI/Hepatic Neg liver ROS, GERD  ,  Endo/Other  Hypothyroidism   Renal/GU negative Renal ROS  negative genitourinary   Musculoskeletal   Abdominal   Peds  Hematology negative hematology ROS (+)   Anesthesia Other Findings   Reproductive/Obstetrics negative OB ROS                             Anesthesia Physical Anesthesia Plan  ASA: II  Anesthesia Plan: General   Post-op Pain Management:    Induction:   PONV Risk Score and Plan: 1 and Propofol infusion and TIVA  Airway Management Planned:   Additional Equipment:   Intra-op Plan:   Post-operative Plan:   Informed Consent: I have reviewed the patients History and Physical, chart, labs and discussed the procedure including the risks, benefits and alternatives for the proposed anesthesia with the patient or authorized representative who has indicated his/her understanding and acceptance.     Dental Advisory Given  Plan Discussed with: CRNA  Anesthesia Plan Comments:         Anesthesia Quick Evaluation

## 2019-05-01 ENCOUNTER — Ambulatory Visit: Payer: Self-pay | Admitting: Family Medicine

## 2019-05-01 ENCOUNTER — Encounter: Payer: Self-pay | Admitting: Family Medicine

## 2019-05-01 ENCOUNTER — Other Ambulatory Visit: Payer: Self-pay

## 2019-05-01 DIAGNOSIS — E039 Hypothyroidism, unspecified: Secondary | ICD-10-CM

## 2019-05-01 DIAGNOSIS — I10 Essential (primary) hypertension: Secondary | ICD-10-CM

## 2019-05-01 DIAGNOSIS — K219 Gastro-esophageal reflux disease without esophagitis: Secondary | ICD-10-CM

## 2019-05-01 DIAGNOSIS — E782 Mixed hyperlipidemia: Secondary | ICD-10-CM

## 2019-05-01 MED ORDER — OMEPRAZOLE 20 MG PO CPDR: 20.0000 mg | DELAYED_RELEASE_CAPSULE | Freq: Every day | ORAL | 0 refills | Status: DC

## 2019-05-01 MED ORDER — SIMVASTATIN 20 MG PO TABS: 20.0000 mg | ORAL_TABLET | Freq: Every day | ORAL | 0 refills | Status: DC

## 2019-05-01 MED ORDER — LISINOPRIL 10 MG PO TABS: 10.0000 mg | ORAL_TABLET | Freq: Every day | ORAL | 0 refills | Status: DC

## 2019-05-01 MED ORDER — LEVOTHYROXINE SODIUM 175 MCG PO TABS: 175.0000 ug | ORAL_TABLET | Freq: Every day | ORAL | 0 refills | Status: DC

## 2019-05-01 NOTE — Progress Notes (Signed)
   Virtual Visit via Telephone Note  I connected with [name] on 05/01/19 at  5:45 PM EST by telephone and verified that I am speaking with the correct person using two identifiers.   I discussed the limitations, risks, security and privacy concerns of performing an evaluation and management service by telephone and the availability of in person appointments. I also discussed with the patient that there may be a patient responsible charge related to this service. The patient expressed understanding and agreed to proceed.   History of Present Illness: Douglas Bird  has a past medical history of Diverticulitis (2011), GERD (gastroesophageal reflux disease), Hyperlipidemia, Hypertension, Hypothyroidism, and Thyroid disease. Patient states that he had a colonoscopy this week. He is dealing with hemmorhoids, but has cream for this. He states that he does check his BP at home and has had normal readings. He is eating a low sodium diet, exercises and has cut back on smoking. He denies chest pain, SOB, dizziness or leg swelling. He reports compliance with his medications.    Observations/Objective: Patient with regular voice tone, rate and rhythm. Speaking calmly and is in no apparent distress.    Assessment and Plan: 1. Chronic GERD - omeprazole (PRILOSEC) 20 MG capsule; Take 1 capsule (20 mg total) by mouth daily.  Dispense: 90 capsule; Refill: 0  2. Benign hypertension - lisinopril (ZESTRIL) 10 MG tablet; Take 1 tablet (10 mg total) by mouth daily.  Dispense: 90 tablet; Refill: 0  3. Hypothyroidism, unspecified type TSH slightly low. Will decrease to 199mcg and repeat in 6 weeks.  - levothyroxine (SYNTHROID) 175 MCG tablet; Take 1 tablet (175 mcg total) by mouth daily before breakfast.  Dispense: 90 tablet; Refill: 0 - Thyroid Panel With TSH; Future  4. Mixed hyperlipidemia - simvastatin (ZOCOR) 20 MG tablet; Take 1 tablet (20 mg total) by mouth daily.  Dispense: 90 tablet; Refill:  0    Follow Up Instructions:  We discussed hand washing, using hand sanitizer when soap and water are not available, only going out when absolutely necessary, and social distancing. I discussed the assessment and treatment plan with the patient. The patient was provided an opportunity to ask questions and all were answered. The patient agreed with the plan and demonstrated an understanding of the instructions.   The patient was advised to call back or seek an in-person evaluation if the symptoms worsen or if the condition fails to improve as anticipated.  I provided 10 minutes of non-face-to-face time during this encounter.  Ms. Andr L. Nathaneil Canary, FNP-BC [facility] [address] [place], [place] [address] [phone]

## 2019-05-01 NOTE — Patient Instructions (Signed)

## 2019-06-04 ENCOUNTER — Other Ambulatory Visit: Payer: Self-pay

## 2019-06-04 DIAGNOSIS — E039 Hypothyroidism, unspecified: Secondary | ICD-10-CM

## 2019-06-05 ENCOUNTER — Other Ambulatory Visit: Payer: Self-pay

## 2019-06-05 LAB — THYROID PANEL WITH TSH
Free Thyroxine Index: 2.9 (ref 1.2–4.9)
T3 Uptake Ratio: 30 % (ref 24–39)
T4, Total: 9.8 ug/dL (ref 4.5–12.0)
TSH: 0.042 u[IU]/mL — ABNORMAL LOW (ref 0.450–4.500)

## 2019-06-07 ENCOUNTER — Telehealth: Payer: Self-pay | Admitting: Pharmacy Technician

## 2019-06-07 NOTE — Telephone Encounter (Signed)
Received updated proof of income.  Patient eligible to receive medication assistance at [facility] until time for re-certification in 9359, and as long as eligibility requirements continue to be met.  [facility]

## 2019-07-24 ENCOUNTER — Other Ambulatory Visit: Payer: Self-pay

## 2019-07-24 ENCOUNTER — Ambulatory Visit: Payer: Self-pay | Admitting: Urology

## 2019-07-24 VITALS — BP 145/96 | HR 79 | Temp 97.3°F | Wt 203.0 lb

## 2019-07-24 DIAGNOSIS — K219 Gastro-esophageal reflux disease without esophagitis: Secondary | ICD-10-CM

## 2019-07-24 DIAGNOSIS — N529 Male erectile dysfunction, unspecified: Secondary | ICD-10-CM

## 2019-07-24 DIAGNOSIS — K429 Umbilical hernia without obstruction or gangrene: Secondary | ICD-10-CM

## 2019-07-24 DIAGNOSIS — E039 Hypothyroidism, unspecified: Secondary | ICD-10-CM

## 2019-07-24 DIAGNOSIS — E782 Mixed hyperlipidemia: Secondary | ICD-10-CM

## 2019-07-24 MED ORDER — LEVOTHYROXINE SODIUM 150 MCG PO TABS: 150.0000 ug | ORAL_TABLET | Freq: Every day | ORAL | 0 refills | Status: DC

## 2019-07-24 MED ORDER — OMEPRAZOLE 20 MG PO CPDR: 20.0000 mg | DELAYED_RELEASE_CAPSULE | Freq: Every day | ORAL | 0 refills | Status: DC

## 2019-07-24 MED ORDER — MELOXICAM 7.5 MG PO TABS: 7.5000 mg | ORAL_TABLET | Freq: Every day | ORAL | 0 refills | Status: DC

## 2019-07-24 MED ORDER — SIMVASTATIN 20 MG PO TABS: 20.0000 mg | ORAL_TABLET | Freq: Every day | ORAL | 0 refills | Status: DC

## 2019-07-24 MED ORDER — SILDENAFIL CITRATE 100 MG PO TABS: 100.0000 mg | ORAL_TABLET | Freq: Every day | ORAL | 0 refills | Status: DC | PRN

## 2019-07-24 NOTE — Progress Notes (Signed)
  Patient: Elzie Yahnke Male    DOB: 09-29-1965   54 y.o.   MRN: [id number] Visit Date: 08/17/2019  Today's Provider: [place]   Chief Complaint  Patient presents with  . Follow-up   Subjective:    HPI Mr. Grun is a 54 year old male with HTN, GERD, HLD, hypothyroidism and ED who presents today for follow up.  HTN Controlled.  On lisinopril 10 mg QD.    GERD Colonoscopy completed 04/28/2019 with Dr. Marius Ditch.  Findings:  the perianal exam findings include anal canal lesion, likely inflammed external hemorrhoid.  A few diverticula were found in the sigmoid colon.  Oozing external hemorrhoids were found during retroflexion, during perianal exam and during endoscopy. The hemorrhoids were large and inflammed.  Recommended Anusol cream to external anal lesion and referral to general surgery if not healed in two weeks and colonoscopy in 10 years.    HLD Total cholesterol 185 04/24/2019.  On simvastatin 20 mg QD.  Hypothyroidism TSH 0.042 on 06/04/2019.  On levothyroxine 175 mcg QD.    ED Issues with maintaining and achieving an erection.     No Known Allergies Previous Medications   LEVOTHYROXINE (SYNTHROID) 175 MCG TABLET    Take 175 mcg by mouth daily before breakfast.    Review of Systems  All other systems reviewed and are negative.   Social History   Tobacco Use  . Smoking status: Current Every Day Smoker    Packs/day: 0.25    Years: 15.00    Pack years: 3.75    Types: Cigarettes  . Smokeless tobacco: Former Network engineer Use Topics  . Alcohol use: Yes    Alcohol/week: 12.0 standard drinks    Types: 12 Cans of beer per week    Comment: Social Drinker   Objective:   BP (!) 145/96   Pulse 79   Temp (!) 97.3 F (36.3 C)   Wt 203 lb (92.1 kg)   SpO2 95%   BMI 29.98 kg/m   Physical Exam        Assessment & Plan:   1. Umbilical hernia without obstruction and without gangrene - Ambulatory referral to General Surgery  2. Chronic GERD -  omeprazole (PRILOSEC) 20 MG capsule; Take 1 capsule (20 mg total) by mouth daily.  Dispense: 90 capsule; Refill: 0  3. Mixed hyperlipidemia - simvastatin (ZOCOR) 20 MG tablet; Take 1 tablet (20 mg total) by mouth daily.  Dispense: 90 tablet; Refill: 0  4. Hypothyroidism, unspecified type - will decrease levothyroxine  - levothyroxine (SYNTHROID) 150 MCG tablet; Take 1 tablet (150 mcg total) by mouth daily before breakfast.  Dispense: 90 tablet; Refill: 0 - TSH; Future - in one month  5. Erectile dysfunction of organic origin -Patient with no history of significant heart disease and has not been taking nitrates -Prescription given for Viagra 100 mg, #10-advised to take 30 minutes prior to intercourse on empty stomach and warned not to take with nitrate products   [facility]

## 2019-07-28 ENCOUNTER — Ambulatory Visit: Payer: Self-pay | Admitting: Pharmacist

## 2019-07-28 ENCOUNTER — Other Ambulatory Visit: Payer: Self-pay

## 2019-07-28 ENCOUNTER — Other Ambulatory Visit: Payer: Self-pay | Admitting: Gerontology

## 2019-07-28 ENCOUNTER — Encounter: Payer: Self-pay | Admitting: Pharmacist

## 2019-07-28 DIAGNOSIS — I10 Essential (primary) hypertension: Secondary | ICD-10-CM

## 2019-07-28 DIAGNOSIS — Z79899 Other long term (current) drug therapy: Secondary | ICD-10-CM

## 2019-07-28 NOTE — Progress Notes (Signed)
[facility] Visit Note  Patient: Douglas Bird MRN: [id number] Date of Birth: [birth year] PCP: Staci Acosta, NP   [name] 54 y.o. male , was contacted via telephone today for a medication therapy management review. Identify verified by name and date of birth.  There were no vitals taken for this visit.  Patient Information   Past Medical History:  Diagnosis Date  . Diverticulitis 2011  . GERD (gastroesophageal reflux disease)   . Hyperlipidemia   . Hypertension    controlled on meds  . Hypothyroidism   . Thyroid disease       Past Surgical History:  Procedure Laterality Date  . CHOLECYSTECTOMY  June 2013  . COLONOSCOPY WITH PROPOFOL N/A 04/28/2019   Procedure: COLONOSCOPY WITH PROPOFOL;  Surgeon: Lin Landsman, MD;  Location: [facility];  Service: Endoscopy;  Laterality: N/A;  priority 4     Family History  Problem Relation Age of Onset  . Stroke Father   . Gout Father   . Aneurysm Father        2006  . Thyroid disease Father   . Epilepsy Father   . Thyroid disease Sister   . Diabetes Paternal Grandfather   . Cancer Mother     New Diagnoses (since last visit):   Family Support: Good           Social History   Substance and Sexual Activity  Alcohol Use Yes  . Alcohol/week: 12.0 standard drinks  . Types: 12 Cans of beer per week   Comment: Social Drinker      Social History   Tobacco Use  Smoking Status Current Every Day Smoker  . Packs/day: 0.50  . Years: 15.00  . Pack years: 7.50  . Types: Cigarettes  Smokeless Tobacco Former Biomedical engineer  Topic Date Due  . PNEUMOCOCCAL POLYSACCHARIDE VACCINE AGE 3-64 HIGH RISK  Never done  . FOOT EXAM  Never done  . OPHTHALMOLOGY EXAM  Never done  . HIV Screening  Never done  . COVID-19 Vaccine (1) Never done  . TETANUS/TDAP  Never done  . INFLUENZA VACCINE  10/19/2019  . HEMOGLOBIN A1C  10/22/2019  . COLONOSCOPY  04/27/2029   Outpatient  Encounter Medications as of 07/28/2019  Medication Sig  . levothyroxine (SYNTHROID) 150 MCG tablet Take 1 tablet (150 mcg total) by mouth daily before breakfast.  . lisinopril (ZESTRIL) 10 MG tablet TAKE ONE TABLET BY MOUTH EVERY DAY  . meloxicam (MOBIC) 7.5 MG tablet Take 1 tablet (7.5 mg total) by mouth daily.  Marland Kitchen omeprazole (PRILOSEC) 20 MG capsule Take 1 capsule (20 mg total) by mouth daily.  . sildenafil (VIAGRA) 100 MG tablet Take 1 tablet (100 mg total) by mouth daily as needed for erectile dysfunction.  . simvastatin (ZOCOR) 20 MG tablet Take 1 tablet (20 mg total) by mouth daily.   No facility-administered encounter medications on file as of 07/28/2019.   Health Maintenance/Date Completed  Last ED visit: None recent Last Visit to PCP: 07/24/19 Next Visit to PCP: 10/30/19 Specialist Visit: 07/2019 Surgeon Dental Exam: 2021 Eye Exam: 2020 Prostate Exam: No DEXA: No Colonoscopy: 2021 Flu Vaccine: Yes Pneumonia Vaccine: No COVID-19 Vaccine: No Shingrix Vaccine: No  Assessment:  Hypertension: Currently taking lisinopril 10mg  daily. Requested refill at today's visit. Not able to assess BP as this visit was conducted via telephone.  Hyperlipidemia: Currently on simvastatin. No muscle aches or pain. Labs on 04/24/19: TC = 185 mg/dl; TG =  117 mg/dl; HDL = 40 mg/dl; LDL = 124 mg/dl  GERD: Currently controlled on omeprazole 20mg  daily.  Thyroid: Taking levothyroxine 150 mcg daily. TSH on 06/04/19 was 0.042 on 06/04/19. Dose was decreased to 150 mcg daily on 07/24/19. TSH scheduled for 09/04/19 and 10/23/19. Patient denies any issues or concerns.  Smoking: Currently smoking a few cigarettes per day. Patient has significantly cut down.  Umbilical hernia: Patient scheduled to see a surgeon this week for assessment.   Health Prevention: Colonoscopy completed 04/28/19. Per patient, no abnormalities detected. Patient inquired about having PSA checked. This is scheduled to be done on  10/23/19 at [facility].   Return to Clinic in 1 year   Audriella Blakeley K. Dicky Doe, PharmD [facility] [place] Operations Coordinator [phone]

## 2019-07-30 ENCOUNTER — Other Ambulatory Visit: Payer: Self-pay

## 2019-07-31 ENCOUNTER — Ambulatory Visit (INDEPENDENT_AMBULATORY_CARE_PROVIDER_SITE_OTHER): Payer: Self-pay | Admitting: General Surgery

## 2019-07-31 ENCOUNTER — Encounter: Payer: Self-pay | Admitting: General Surgery

## 2019-07-31 ENCOUNTER — Other Ambulatory Visit: Payer: Self-pay

## 2019-07-31 VITALS — BP 132/88 | HR 81 | Temp 98.1°F | Resp 12 | Ht 69.0 in | Wt 203.6 lb

## 2019-07-31 DIAGNOSIS — K420 Umbilical hernia with obstruction, without gangrene: Secondary | ICD-10-CM

## 2019-07-31 NOTE — H&P (View-Only) (Signed)
Patient ID: Douglas Bird, male   DOB: 20-Feb-1966, 54 y.o.   MRN: [id number]  Chief Complaint  Patient presents with  . New Patient (Initial Visit)    umbilical hernia    HPI Douglas Bird is a 54 y.o. male.   He was referred by Zara Council, PA-C, for evaluation of an umbilical hernia.  He states that he has had what he calls a "dickey-do" near his bellybutton since he had his gallbladder removed in 2013.  About 3 weeks ago, he was helping move furniture and appliances for his sister.  He then noticed a burning sensation around his umbilicus and noticed that the "dickey-do" was bigger.  He was being seen in urology for follow-up of a procedure and Ms. McGowan noted the hernia and suggested he have it repaired.  He is here today to discuss this.  He says that the bulge is always present.  It is soft but he does not try to push it in.  He denies any fevers or chills.  No nausea or vomiting.  No constipation, obstipation, or other concern for intestinal obstruction.  He denies any pain, just the burning sensation.  He says that bending over, sitting, twisting, or other activity results in that feeling of burning.  It does not radiate and is localized to the umbilicus.   Past Medical History:  Diagnosis Date  . Diverticulitis 2011  . GERD (gastroesophageal reflux disease)   . Hyperlipidemia   . Hypertension    controlled on meds  . Hypothyroidism   . Thyroid disease     Past Surgical History:  Procedure Laterality Date  . CHOLECYSTECTOMY  June 2013  . COLONOSCOPY WITH PROPOFOL N/A 04/28/2019   Procedure: COLONOSCOPY WITH PROPOFOL;  Surgeon: Lin Landsman, MD;  Location: [place];  Service: Endoscopy;  Laterality: N/A;  priority 4  . HERNIA REPAIR      Family History  Problem Relation Age of Onset  . Stroke Father   . Gout Father   . Aneurysm Father        2006  . Thyroid disease Father   . Epilepsy Father   . Thyroid disease Sister   . Diabetes Paternal Grandfather    . Cancer Mother     Social History Social History   Tobacco Use  . Smoking status: Current Every Day Smoker    Packs/day: 0.50    Years: 15.00    Pack years: 7.50    Types: Cigarettes  . Smokeless tobacco: Former Network engineer Use Topics  . Alcohol use: Yes    Alcohol/week: 12.0 standard drinks    Types: 12 Cans of beer per week    Comment: Social Drinker  . Drug use: No    No Known Allergies  Current Outpatient Medications  Medication Sig Dispense Refill  . levothyroxine (SYNTHROID) 150 MCG tablet Take 1 tablet (150 mcg total) by mouth daily before breakfast. 90 tablet 0  . lisinopril (ZESTRIL) 10 MG tablet TAKE ONE TABLET BY MOUTH EVERY DAY 90 tablet 0  . meloxicam (MOBIC) 7.5 MG tablet Take 1 tablet (7.5 mg total) by mouth daily. 90 tablet 0  . omeprazole (PRILOSEC) 20 MG capsule Take 1 capsule (20 mg total) by mouth daily. 90 capsule 0  . sildenafil (VIAGRA) 100 MG tablet Take 1 tablet (100 mg total) by mouth daily as needed for erectile dysfunction. 10 tablet 0  . simvastatin (ZOCOR) 20 MG tablet Take 1 tablet (20 mg total) by mouth daily. [place]  tablet 0   No current facility-administered medications for this visit.    Review of Systems Review of Systems  All other systems reviewed and are negative.   Blood pressure 132/88, pulse 81, temperature 98.1 F (36.7 C), resp. rate 12, height 5\' 9"  (1.753 m), weight 203 lb 9.6 oz (92.4 kg), SpO2 96 %. Body mass index is 30.07 kg/m.  Physical Exam Physical Exam Vitals reviewed. Exam conducted with a chaperone present.  Constitutional:      General: He is not in acute distress.    Appearance: He is obese.  HENT:     Head: Normocephalic and atraumatic.     Nose:     Comments: Covered with a mask secondary to COVID-19 precautions    Mouth/Throat:     Comments: Covered with a mask secondary to COVID-19 precautions Eyes:     General: No scleral icterus.       Right eye: No discharge.        Left eye: No discharge.      Conjunctiva/sclera: Conjunctivae normal.  Neck:     Comments: No palpable cervical or supraclavicular lymphadenopathy.  No dominant thyroid masses or thyromegaly appreciated. Cardiovascular:     Rate and Rhythm: Normal rate and regular rhythm.     Pulses: Normal pulses.  Pulmonary:     Effort: Pulmonary effort is normal.     Breath sounds: Normal breath sounds.  Abdominal:     General: Abdomen is flat.     Palpations: Abdomen is soft.     Hernia: A hernia is present.     Comments: There is a nonreducible umbilical hernia.  The skin overlying the herniated fat is somewhat thinned.  Genitourinary:    Comments: Deferred Musculoskeletal:        General: No swelling or tenderness.  Skin:    General: Skin is warm and dry.  Neurological:     General: No focal deficit present.     Mental Status: He is alert and oriented to person, place, and time.  Psychiatric:        Mood and Affect: Mood normal.        Behavior: Behavior normal.     Data Reviewed Ms. Lucianne Lei Gallant's note from 07/24/2019 was reviewed. It appears to be partially incomplete but does comment on the presence of an umbilical hernia and her recommendation that he be seen in general surgery.  Assessment This is a 54 year old man with an incarcerated umbilical hernia. I have recommended that he undergo surgery.  Plan I discussed the risks of open umbilical hernia repair with Douglas Bird. These include, but are not limited to, bleeding, infection, damage to bowel or other tissues, need for mesh, mesh infection, mesh migration, mesh complications, hernia recurrence, and risks of anesthesia. He is interested in proceeding with surgery and had the opportunity to ask questions. We will get him scheduled.    Douglas Bird 07/31/2019, 10:31 AM

## 2019-07-31 NOTE — Progress Notes (Signed)
Patient ID: Douglas Bird, male   DOB: 1965-05-31, 54 y.o.   MRN: [id number]  Chief Complaint  Patient presents with  . New Patient (Initial Visit)    umbilical hernia    HPI Douglas Bird is a 54 y.o. male.   He was referred by Douglas Council, PA-C, for evaluation of an umbilical hernia.  He states that he has had what he calls a "dickey-do" near his bellybutton since he had his gallbladder removed in 2013.  About 3 weeks ago, he was helping move furniture and appliances for his sister.  He then noticed a burning sensation around his umbilicus and noticed that the "dickey-do" was bigger.  He was being seen in urology for follow-up of a procedure and Douglas Bird noted the hernia and suggested he have it repaired.  He is here today to discuss this.  He says that the bulge is always present.  It is soft but he does not try to push it in.  He denies any fevers or chills.  No nausea or vomiting.  No constipation, obstipation, or other concern for intestinal obstruction.  He denies any pain, just the burning sensation.  He says that bending over, sitting, twisting, or other activity results in that feeling of burning.  It does not radiate and is localized to the umbilicus.   Past Medical History:  Diagnosis Date  . Diverticulitis 2011  . GERD (gastroesophageal reflux disease)   . Hyperlipidemia   . Hypertension    controlled on meds  . Hypothyroidism   . Thyroid disease     Past Surgical History:  Procedure Laterality Date  . CHOLECYSTECTOMY  June 2013  . COLONOSCOPY WITH PROPOFOL N/A 04/28/2019   Procedure: COLONOSCOPY WITH PROPOFOL;  Surgeon: Lin Landsman, MD;  Location: [place];  Service: Endoscopy;  Laterality: N/A;  priority 4  . HERNIA REPAIR      Family History  Problem Relation Age of Onset  . Stroke Father   . Gout Father   . Aneurysm Father        2006  . Thyroid disease Father   . Epilepsy Father   . Thyroid disease Sister   . Diabetes Paternal Grandfather    . Cancer Mother     Social History Social History   Tobacco Use  . Smoking status: Current Every Day Smoker    Packs/day: 0.50    Years: 15.00    Pack years: 7.50    Types: Cigarettes  . Smokeless tobacco: Former Network engineer Use Topics  . Alcohol use: Yes    Alcohol/week: 12.0 standard drinks    Types: 12 Cans of beer per week    Comment: Social Drinker  . Drug use: No    No Known Allergies  Current Outpatient Medications  Medication Sig Dispense Refill  . levothyroxine (SYNTHROID) 150 MCG tablet Take 1 tablet (150 mcg total) by mouth daily before breakfast. 90 tablet 0  . lisinopril (ZESTRIL) 10 MG tablet TAKE ONE TABLET BY MOUTH EVERY DAY 90 tablet 0  . meloxicam (MOBIC) 7.5 MG tablet Take 1 tablet (7.5 mg total) by mouth daily. 90 tablet 0  . omeprazole (PRILOSEC) 20 MG capsule Take 1 capsule (20 mg total) by mouth daily. 90 capsule 0  . sildenafil (VIAGRA) 100 MG tablet Take 1 tablet (100 mg total) by mouth daily as needed for erectile dysfunction. 10 tablet 0  . simvastatin (ZOCOR) 20 MG tablet Take 1 tablet (20 mg total) by mouth daily. [place]  tablet 0   No current facility-administered medications for this visit.    Review of Systems Review of Systems  All other systems reviewed and are negative.   Blood pressure 132/88, pulse 81, temperature 98.1 F (36.7 C), resp. rate 12, height 5\' 9"  (1.753 m), weight 203 lb 9.6 oz (92.4 kg), SpO2 96 %. Body mass index is 30.07 kg/m.  Physical Exam Physical Exam Vitals reviewed. Exam conducted with a chaperone present.  Constitutional:      General: He is not in acute distress.    Appearance: He is obese.  HENT:     Head: Normocephalic and atraumatic.     Nose:     Comments: Covered with a mask secondary to COVID-19 precautions    Mouth/Throat:     Comments: Covered with a mask secondary to COVID-19 precautions Eyes:     General: No scleral icterus.       Right eye: No discharge.        Left eye: No discharge.      Conjunctiva/sclera: Conjunctivae normal.  Neck:     Comments: No palpable cervical or supraclavicular lymphadenopathy.  No dominant thyroid masses or thyromegaly appreciated. Cardiovascular:     Rate and Rhythm: Normal rate and regular rhythm.     Pulses: Normal pulses.  Pulmonary:     Effort: Pulmonary effort is normal.     Breath sounds: Normal breath sounds.  Abdominal:     General: Abdomen is flat.     Palpations: Abdomen is soft.     Hernia: A hernia is present.     Comments: There is a nonreducible umbilical hernia.  The skin overlying the herniated fat is somewhat thinned.  Genitourinary:    Comments: Deferred Musculoskeletal:        General: No swelling or tenderness.  Skin:    General: Skin is warm and dry.  Neurological:     General: No focal deficit present.     Mental Status: He is alert and oriented to person, place, and time.  Psychiatric:        Mood and Affect: Mood normal.        Behavior: Behavior normal.     Data Reviewed Douglas Bird's note from 07/24/2019 was reviewed. It appears to be partially incomplete but does comment on the presence of an umbilical hernia and her recommendation that he be seen in general surgery.  Assessment This is a 54 year old man with an incarcerated umbilical hernia. I have recommended that he undergo surgery.  Plan I discussed the risks of open umbilical hernia repair with Douglas Bird. These include, but are not limited to, bleeding, infection, damage to bowel or other tissues, need for mesh, mesh infection, mesh migration, mesh complications, hernia recurrence, and risks of anesthesia. He is interested in proceeding with surgery and had the opportunity to ask questions. We will get him scheduled.    Douglas Bird 07/31/2019, 10:31 AM

## 2019-07-31 NOTE — Patient Instructions (Addendum)
Our surgery schedule will contact you to schedule your surgery. Please have the blue sheet available when she calls you.   Call the office if you have any questions or concerns.   Umbilical Hernia, Adult  A hernia is a bulge of tissue that pushes through an opening between muscles. An umbilical hernia happens in the abdomen, near the belly button (umbilicus). The hernia may contain tissues from the small intestine, large intestine, or fatty tissue covering the intestines (omentum). Umbilical hernias in adults tend to get worse over time, and they require surgical treatment. There are several types of umbilical hernias. You may have:  A hernia located just above or below the umbilicus (indirect hernia). This is the most common type of umbilical hernia in adults.  A hernia that forms through an opening formed by the umbilicus (direct hernia).  A hernia that comes and goes (reducible hernia). A reducible hernia may be visible only when you strain, lift something heavy, or cough. This type of hernia can be pushed back into the abdomen (reduced).  A hernia that traps abdominal tissue inside the hernia (incarcerated hernia). This type of hernia cannot be reduced.  A hernia that cuts off blood flow to the tissues inside the hernia (strangulated hernia). The tissues can start to die if this happens. This type of hernia requires emergency treatment. What are the causes? An umbilical hernia happens when tissue inside the abdomen presses on a weak area of the abdominal muscles. What increases the risk? You may have a greater risk of this condition if you:  Are obese.  Have had several pregnancies.  Have a buildup of fluid inside your abdomen (ascites).  Have had surgery that weakens the abdominal muscles. What are the signs or symptoms? The main symptom of this condition is a painless bulge at or near the belly button. A reducible hernia may be visible only when you strain, lift something heavy,  or cough. Other symptoms may include:  Dull pain.  A feeling of pressure. Symptoms of a strangulated hernia may include:  Pain that gets increasingly worse.  Nausea and vomiting.  Pain when pressing on the hernia.  Skin over the hernia becoming red or purple.  Constipation.  Blood in the stool. How is this diagnosed? This condition may be diagnosed based on:  A physical exam. You may be asked to cough or strain while standing. These actions increase the pressure inside your abdomen and force the hernia through the opening in your muscles. Your health care provider may try to reduce the hernia by pressing on it.  Your symptoms and medical history. How is this treated? Surgery is the only treatment for an umbilical hernia. Surgery for a strangulated hernia is done as soon as possible. If you have a small hernia that is not incarcerated, you may need to lose weight before having surgery. Follow these instructions at home:  Lose weight, if told by your health care provider.  Do not try to push the hernia back in.  Watch your hernia for any changes in color or size. Tell your health care provider if any changes occur.  You may need to avoid activities that increase pressure on your hernia.  Do not lift anything that is heavier than 10 lb (4.5 kg) until your health care provider says that this is safe.  Take over-the-counter and prescription medicines only as told by your health care provider.  Keep all follow-up visits as told by your health care provider. This is  important. Contact a health care provider if:  Your hernia gets larger.  Your hernia becomes painful. Get help right away if:  You develop sudden, severe pain near the area of your hernia.  You have pain as well as nausea or vomiting.  You have pain and the skin over your hernia changes color.  You develop a fever. This information is not intended to replace advice given to you by your health care provider.  Make sure you discuss any questions you have with your health care provider. Document Revised: 04/18/2017 Document Reviewed: 09/04/2016 Elsevier Patient Education  [place].

## 2019-08-01 ENCOUNTER — Telehealth: Payer: Self-pay | Admitting: General Surgery

## 2019-08-01 NOTE — Telephone Encounter (Signed)
Received call back from patient, he is now aware of all dates regarding his surgery and voices understanding.

## 2019-08-01 NOTE — Telephone Encounter (Signed)
Outgoing call is made, left message for patient to call so that he can be advised of Pre-Admission date/time, COVID Testing date and Surgery date.  Surgery Date: 08/11/19 Preadmission Testing Date: 08/05/19 (phone 8a-1p) Covid Testing Date: 08/07/19 - patient advised to go to the [facility] ([place]) between 8a-1p   Patient also to call [phone], between 1-3:00pm the day before surgery, to find out what time to arrive for surgery.

## 2019-08-05 ENCOUNTER — Encounter
Admission: RE | Admit: 2019-08-05 | Discharge: 2019-08-05 | Disposition: A | Discharge status: Home / Self Care | Payer: Self-pay | Source: Ambulatory Visit | Attending: General Surgery | Admitting: General Surgery

## 2019-08-05 ENCOUNTER — Other Ambulatory Visit: Payer: Self-pay

## 2019-08-05 DIAGNOSIS — Z01812 Encounter for preprocedural laboratory examination: Secondary | ICD-10-CM | POA: Insufficient documentation

## 2019-08-05 NOTE — Patient Instructions (Addendum)
Your procedure is scheduled on:Mon 5/24  Report to Day Surgery   [facility] To find out your arrival time please call [phone] between 1PM - 3PM on Friday 5/21.  Remember: Instructions that are not followed completely may result in serious medical risk,  up to and including death, or upon the discretion of your surgeon and anesthesiologist your  surgery may need to be rescheduled.     _X__ 1. Do not eat food after midnight the night before your procedure.                 No gum chewing or hard candies. You may drink clear liquids up to 2 hours                 before you are scheduled to arrive for your surgery- DO not drink clear                 liquids within 2 hours of the start of your surgery.                 Clear Liquids include:  water, apple juice without pulp, clear Gatorade, G2 or                  Gatorade Zero (avoid Red/Purple/Blue), Black Coffee or Tea (Do not add                 anything to coffee or tea). _____2.   Complete the carbohydrate drink provided to you, 2 hours before arrival.  __X__2.  On the morning of surgery brush your teeth with toothpaste and water, you                may rinse your mouth with mouthwash if you wish.  Do not swallow any toothpaste of mouthwash.     _X__ 3.  No Alcohol for 24 hours before or after surgery.   _X__ 4.  Do Not Smoke or use e-cigarettes For 24 Hours Prior to Your Surgery.                 Do not use any chewable tobacco products for at least 6 hours prior to                 Surgery.  _X__  5.  Do not use any recreational drugs (marijuana, cocaine, heroin, ecstacy, MDMA or other)                For at least one week prior to your surgery.  Combination of these drugs with anesthesia                May have life threatening results.  ____  6.  Bring all medications with you on the day of surgery if instructed.   __x__  7.  Notify your doctor if there is any change in your medical condition   (cold, fever, infections).     Do not wear jewelry,  Do not wear lotions, powders, or perfumes. You may wear deodorant. Do not shave 48 hours prior to surgery. Men may shave face and neck. Do not bring valuables to the hospital.    [facility] is not responsible for any belongings or valuables.  Contacts, dentures or bridgework may not be worn into surgery. Leave your suitcase in the car. After surgery it may be brought to your room. For patients admitted to the hospital, discharge time is determined by your treatment team.   Patients discharged  the day of surgery will not be allowed to drive home.   Make arrangements for someone to be with you for the first 24 hours of your Same Day Discharge.    Please read over the following fact sheets that you were given:    __x__ Take these medicines the morning of surgery with A SIP OF WATER:    1. levothyroxine (SYNTHROID) 175 MCG tablet  2. omeprazole (PRILOSEC) 20 MG capsule  Dose the night before and the morning of surgery  3.   4.  5.  6.  ____ Fleet Enema (as directed)   _x___ Use CHG Soap (or wipes) as directed  ____ Use Benzoyl Peroxide Gel as instructed  ____ Use inhalers on the day of surgery  ____ Stop metformin 2 days prior to surgery    ____ Take 1/2 of usual insulin dose the night before surgery. No insulin the morning          of surgery.   ____ Stop Coumadin/Plavix/aspirin   _x___ Stop Anti-inflammatories ibuprofen aleve and aspirin today    May take tylenol   ____ Stop supplements until after surgery.    ____ Bring C-Pap to the hospital.

## 2019-08-07 ENCOUNTER — Other Ambulatory Visit: Payer: Self-pay

## 2019-08-07 ENCOUNTER — Encounter
Admission: RE | Admit: 2019-08-07 | Discharge: 2019-08-07 | Disposition: A | Discharge status: Home / Self Care | Payer: Self-pay | Source: Ambulatory Visit | Attending: General Surgery | Admitting: General Surgery

## 2019-08-07 DIAGNOSIS — Z20822 Contact with and (suspected) exposure to covid-19: Secondary | ICD-10-CM | POA: Insufficient documentation

## 2019-08-07 DIAGNOSIS — Z01818 Encounter for other preprocedural examination: Secondary | ICD-10-CM | POA: Insufficient documentation

## 2019-08-07 DIAGNOSIS — R9431 Abnormal electrocardiogram [ECG] [EKG]: Secondary | ICD-10-CM | POA: Insufficient documentation

## 2019-08-07 DIAGNOSIS — I10 Essential (primary) hypertension: Secondary | ICD-10-CM | POA: Insufficient documentation

## 2019-08-07 LAB — SARS CORONAVIRUS 2 (TAT 6-24 HRS): SARS Coronavirus 2: NEGATIVE

## 2019-08-11 ENCOUNTER — Encounter: Payer: Self-pay | Admitting: General Surgery

## 2019-08-11 ENCOUNTER — Encounter
Admission: RE | Disposition: A | Discharge status: Home / Self Care | Payer: Self-pay | Source: Home / Self Care | Attending: General Surgery

## 2019-08-11 ENCOUNTER — Other Ambulatory Visit: Payer: Self-pay

## 2019-08-11 ENCOUNTER — Ambulatory Visit: Payer: Self-pay | Admitting: Anesthesiology

## 2019-08-11 ENCOUNTER — Ambulatory Visit
Admission: RE | Admit: 2019-08-11 | Discharge: 2019-08-11 | Disposition: A | Discharge status: Home / Self Care | Payer: Self-pay | Attending: General Surgery | Admitting: General Surgery

## 2019-08-11 DIAGNOSIS — K420 Umbilical hernia with obstruction, without gangrene: Secondary | ICD-10-CM

## 2019-08-11 DIAGNOSIS — Z9049 Acquired absence of other specified parts of digestive tract: Secondary | ICD-10-CM | POA: Insufficient documentation

## 2019-08-11 DIAGNOSIS — Z833 Family history of diabetes mellitus: Secondary | ICD-10-CM | POA: Insufficient documentation

## 2019-08-11 DIAGNOSIS — F17210 Nicotine dependence, cigarettes, uncomplicated: Secondary | ICD-10-CM | POA: Insufficient documentation

## 2019-08-11 DIAGNOSIS — E039 Hypothyroidism, unspecified: Secondary | ICD-10-CM | POA: Insufficient documentation

## 2019-08-11 DIAGNOSIS — Z809 Family history of malignant neoplasm, unspecified: Secondary | ICD-10-CM | POA: Insufficient documentation

## 2019-08-11 DIAGNOSIS — K219 Gastro-esophageal reflux disease without esophagitis: Secondary | ICD-10-CM | POA: Insufficient documentation

## 2019-08-11 DIAGNOSIS — Z79899 Other long term (current) drug therapy: Secondary | ICD-10-CM | POA: Insufficient documentation

## 2019-08-11 DIAGNOSIS — Z8349 Family history of other endocrine, nutritional and metabolic diseases: Secondary | ICD-10-CM | POA: Insufficient documentation

## 2019-08-11 DIAGNOSIS — Z791 Long term (current) use of non-steroidal anti-inflammatories (NSAID): Secondary | ICD-10-CM | POA: Insufficient documentation

## 2019-08-11 DIAGNOSIS — Z823 Family history of stroke: Secondary | ICD-10-CM | POA: Insufficient documentation

## 2019-08-11 DIAGNOSIS — Z8249 Family history of ischemic heart disease and other diseases of the circulatory system: Secondary | ICD-10-CM | POA: Insufficient documentation

## 2019-08-11 DIAGNOSIS — E785 Hyperlipidemia, unspecified: Secondary | ICD-10-CM | POA: Insufficient documentation

## 2019-08-11 DIAGNOSIS — Z820 Family history of epilepsy and other diseases of the nervous system: Secondary | ICD-10-CM | POA: Insufficient documentation

## 2019-08-11 DIAGNOSIS — I10 Essential (primary) hypertension: Secondary | ICD-10-CM | POA: Insufficient documentation

## 2019-08-11 HISTORY — PX: UMBILICAL HERNIA REPAIR: SHX196

## 2019-08-11 SURGERY — REPAIR, HERNIA, UMBILICAL, ADULT
Anesthesia: General

## 2019-08-11 MED ORDER — ONDANSETRON HCL 4 MG/2ML IJ SOLN
INTRAMUSCULAR | Status: AC
  Filled 2019-08-11: qty 2

## 2019-08-11 MED ORDER — CEFAZOLIN SODIUM-DEXTROSE 2-4 GM/100ML-% IV SOLN
INTRAVENOUS | Status: AC
  Filled 2019-08-11: qty 100

## 2019-08-11 MED ORDER — ONDANSETRON HCL 4 MG/2ML IJ SOLN: 4.0000 mg | Freq: Once | INTRAMUSCULAR | Status: DC | PRN

## 2019-08-11 MED ORDER — ALBUTEROL SULFATE HFA 108 (90 BASE) MCG/ACT IN AERS
INHALATION_SPRAY | RESPIRATORY_TRACT | Status: AC
  Filled 2019-08-11: qty 6.7

## 2019-08-11 MED ORDER — SUGAMMADEX SODIUM 200 MG/2ML IV SOLN
INTRAVENOUS | Status: DC | PRN
  Administered 2019-08-11: 200 mg via INTRAVENOUS

## 2019-08-11 MED ORDER — FENTANYL CITRATE (PF) 100 MCG/2ML IJ SOLN
INTRAMUSCULAR | Status: AC
  Administered 2019-08-11: 25 ug via INTRAVENOUS
  Filled 2019-08-11: qty 2

## 2019-08-11 MED ORDER — BUPIVACAINE LIPOSOME 1.3 % IJ SUSP: 20.0000 mL | Freq: Once | INTRAMUSCULAR | Status: DC

## 2019-08-11 MED ORDER — ACETAMINOPHEN 500 MG PO TABS
ORAL_TABLET | ORAL | Status: AC
  Administered 2019-08-11: 1000 mg via ORAL
  Filled 2019-08-11: qty 2

## 2019-08-11 MED ORDER — LACTATED RINGERS IV SOLN: INTRAVENOUS | Status: DC

## 2019-08-11 MED ORDER — CHLORHEXIDINE GLUCONATE CLOTH 2 % EX PADS: 6.0000 | MEDICATED_PAD | Freq: Once | CUTANEOUS | Status: DC

## 2019-08-11 MED ORDER — DEXAMETHASONE SODIUM PHOSPHATE 10 MG/ML IJ SOLN
INTRAMUSCULAR | Status: DC | PRN
  Administered 2019-08-11: 8 mg via INTRAVENOUS

## 2019-08-11 MED ORDER — IBUPROFEN 800 MG PO TABS
800.0000 mg | ORAL_TABLET | Freq: Three times a day (TID) | ORAL | 0 refills | Status: DC | PRN
Start: 2019-08-11 — End: 2020-02-19

## 2019-08-11 MED ORDER — BUPIVACAINE LIPOSOME 1.3 % IJ SUSP
INTRAMUSCULAR | Status: DC | PRN
  Administered 2019-08-11: 20 mL

## 2019-08-11 MED ORDER — GABAPENTIN 300 MG PO CAPS: 300.0000 mg | ORAL_CAPSULE | ORAL | Status: AC

## 2019-08-11 MED ORDER — CELECOXIB 200 MG PO CAPS: 200.0000 mg | ORAL_CAPSULE | ORAL | Status: AC

## 2019-08-11 MED ORDER — GLYCOPYRROLATE 0.2 MG/ML IJ SOLN
INTRAMUSCULAR | Status: DC | PRN
  Administered 2019-08-11: .1 mg via INTRAVENOUS

## 2019-08-11 MED ORDER — OXYCODONE HCL 5 MG/5ML PO SOLN: 5.0000 mg | Freq: Once | ORAL | Status: DC | PRN

## 2019-08-11 MED ORDER — ROCURONIUM BROMIDE 100 MG/10ML IV SOLN
INTRAVENOUS | Status: DC | PRN
  Administered 2019-08-11: 60 mg via INTRAVENOUS
  Administered 2019-08-11: 10 mg via INTRAVENOUS

## 2019-08-11 MED ORDER — GABAPENTIN 300 MG PO CAPS
ORAL_CAPSULE | ORAL | Status: AC
  Administered 2019-08-11: 300 mg via ORAL
  Filled 2019-08-11: qty 1

## 2019-08-11 MED ORDER — LIDOCAINE-EPINEPHRINE 1 %-1:100000 IJ SOLN
INTRAMUSCULAR | Status: AC
  Filled 2019-08-11: qty 1

## 2019-08-11 MED ORDER — BUPIVACAINE HCL (PF) 0.25 % IJ SOLN
INTRAMUSCULAR | Status: AC
  Filled 2019-08-11: qty 30

## 2019-08-11 MED ORDER — DEXMEDETOMIDINE HCL 200 MCG/2ML IV SOLN
INTRAVENOUS | Status: DC | PRN
  Administered 2019-08-11: 12 ug via INTRAVENOUS
  Administered 2019-08-11: 8 ug via INTRAVENOUS
  Administered 2019-08-11: 12 ug via INTRAVENOUS
  Administered 2019-08-11: 8 ug via INTRAVENOUS

## 2019-08-11 MED ORDER — ACETAMINOPHEN 500 MG PO TABS: 1000.0000 mg | ORAL_TABLET | ORAL | Status: AC

## 2019-08-11 MED ORDER — ALBUTEROL SULFATE HFA 108 (90 BASE) MCG/ACT IN AERS
INHALATION_SPRAY | RESPIRATORY_TRACT | Status: DC | PRN
  Administered 2019-08-11: 4 via RESPIRATORY_TRACT

## 2019-08-11 MED ORDER — BUPIVACAINE HCL 0.25 % IJ SOLN
INTRAMUSCULAR | Status: DC | PRN
  Administered 2019-08-11: 10 mL

## 2019-08-11 MED ORDER — PROPOFOL 10 MG/ML IV BOLUS
INTRAVENOUS | Status: DC | PRN
  Administered 2019-08-11: 20 mg via INTRAVENOUS
  Administered 2019-08-11: 180 mg via INTRAVENOUS

## 2019-08-11 MED ORDER — ONDANSETRON HCL 4 MG/2ML IJ SOLN
INTRAMUSCULAR | Status: DC | PRN
  Administered 2019-08-11: 4 mg via INTRAVENOUS

## 2019-08-11 MED ORDER — MIDAZOLAM HCL 2 MG/2ML IJ SOLN
INTRAMUSCULAR | Status: DC | PRN
  Administered 2019-08-11: 2 mg via INTRAVENOUS

## 2019-08-11 MED ORDER — LIDOCAINE-EPINEPHRINE 1 %-1:100000 IJ SOLN
INTRAMUSCULAR | Status: DC | PRN
  Administered 2019-08-11: 10 mL

## 2019-08-11 MED ORDER — ROCURONIUM BROMIDE 10 MG/ML (PF) SYRINGE
PREFILLED_SYRINGE | INTRAVENOUS | Status: AC
  Filled 2019-08-11: qty 10

## 2019-08-11 MED ORDER — CELECOXIB 200 MG PO CAPS
ORAL_CAPSULE | ORAL | Status: AC
  Administered 2019-08-11: 200 mg via ORAL
  Filled 2019-08-11: qty 1

## 2019-08-11 MED ORDER — LIDOCAINE HCL (CARDIAC) PF 100 MG/5ML IV SOSY
PREFILLED_SYRINGE | INTRAVENOUS | Status: DC | PRN
  Administered 2019-08-11: 80 mg via INTRAVENOUS
  Administered 2019-08-11: 20 mg via INTRAVENOUS

## 2019-08-11 MED ORDER — PROPOFOL 10 MG/ML IV BOLUS
INTRAVENOUS | Status: AC
  Filled 2019-08-11: qty 20

## 2019-08-11 MED ORDER — GLYCOPYRROLATE 0.2 MG/ML IJ SOLN
INTRAMUSCULAR | Status: AC
  Filled 2019-08-11: qty 1

## 2019-08-11 MED ORDER — FENTANYL CITRATE (PF) 100 MCG/2ML IJ SOLN
25.0000 ug | INTRAMUSCULAR | Status: DC | PRN
  Administered 2019-08-11: 25 ug via INTRAVENOUS

## 2019-08-11 MED ORDER — MIDAZOLAM HCL 2 MG/2ML IJ SOLN
INTRAMUSCULAR | Status: AC
  Filled 2019-08-11: qty 2

## 2019-08-11 MED ORDER — CEFAZOLIN SODIUM-DEXTROSE 2-4 GM/100ML-% IV SOLN
2.0000 g | INTRAVENOUS | Status: AC
  Administered 2019-08-11: 2 g via INTRAVENOUS

## 2019-08-11 MED ORDER — DEXAMETHASONE SODIUM PHOSPHATE 10 MG/ML IJ SOLN
INTRAMUSCULAR | Status: AC
  Filled 2019-08-11: qty 1

## 2019-08-11 MED ORDER — HYDROCODONE-ACETAMINOPHEN 5-325 MG PO TABS
1.0000 | ORAL_TABLET | Freq: Four times a day (QID) | ORAL | 0 refills | Status: DC | PRN
Start: 2019-08-11 — End: 2020-04-27

## 2019-08-11 MED ORDER — FENTANYL CITRATE (PF) 100 MCG/2ML IJ SOLN
INTRAMUSCULAR | Status: AC
  Filled 2019-08-11: qty 2

## 2019-08-11 MED ORDER — FENTANYL CITRATE (PF) 100 MCG/2ML IJ SOLN
INTRAMUSCULAR | Status: DC | PRN
  Administered 2019-08-11 (×2): 25 ug via INTRAVENOUS
  Administered 2019-08-11: 50 ug via INTRAVENOUS

## 2019-08-11 MED ORDER — KETAMINE HCL 10 MG/ML IJ SOLN
INTRAMUSCULAR | Status: DC | PRN
Start: 2019-08-11 — End: 2019-08-11
  Administered 2019-08-11: 30 mg via INTRAVENOUS

## 2019-08-11 MED ORDER — BUPIVACAINE LIPOSOME 1.3 % IJ SUSP
INTRAMUSCULAR | Status: AC
  Filled 2019-08-11: qty 20

## 2019-08-11 MED ORDER — OXYCODONE HCL 5 MG PO TABS: 5.0000 mg | ORAL_TABLET | Freq: Once | ORAL | Status: DC | PRN

## 2019-08-11 SURGICAL SUPPLY — 32 items
BLADE SURG 15 STRL LF DISP TIS (BLADE) ×1 IMPLANT
BLADE SURG 15 STRL SS (BLADE) ×1
CANISTER SUCT 1200ML W/VALVE (MISCELLANEOUS) ×2 IMPLANT
CHLORAPREP W/TINT 26 (MISCELLANEOUS) ×2 IMPLANT
COVER WAND RF STERILE (DRAPES) ×2 IMPLANT
DERMABOND ADVANCED (GAUZE/BANDAGES/DRESSINGS) ×1
DERMABOND ADVANCED .7 DNX12 (GAUZE/BANDAGES/DRESSINGS) ×1 IMPLANT
DRAPE LAPAROTOMY 77X122 PED (DRAPES) ×2 IMPLANT
ELECT CAUTERY BLADE TIP 2.5 (TIP) ×2
ELECT REM PT RETURN 9FT ADLT (ELECTROSURGICAL) ×2
ELECTRODE CAUTERY BLDE TIP 2.5 (TIP) ×1 IMPLANT
ELECTRODE REM PT RTRN 9FT ADLT (ELECTROSURGICAL) ×1 IMPLANT
GLOVE BIO SURGEON STRL SZ 6.5 (GLOVE) ×2 IMPLANT
GLOVE INDICATOR 7.0 STRL GRN (GLOVE) ×4 IMPLANT
GOWN STRL REUS W/ TWL LRG LVL3 (GOWN DISPOSABLE) ×2 IMPLANT
GOWN STRL REUS W/TWL LRG LVL3 (GOWN DISPOSABLE) ×2
KIT TURNOVER KIT A (KITS) ×2 IMPLANT
LABEL OR SOLS (LABEL) ×2 IMPLANT
NEEDLE HYPO 22GX1.5 SAFETY (NEEDLE) ×4 IMPLANT
NS IRRIG 500ML POUR BTL (IV SOLUTION) ×2 IMPLANT
PACK BASIN MINOR (MISCELLANEOUS) ×2 IMPLANT
STRIP CLOSURE SKIN 1/2X4 (GAUZE/BANDAGES/DRESSINGS) ×2 IMPLANT
SUT ETHIBOND NAB MO 7 #0 18IN (SUTURE) IMPLANT
SUT MNCRL 4-0 (SUTURE) ×1
SUT MNCRL 4-0 27XMFL (SUTURE) ×1
SUT VIC AB 3-0 SH 27 (SUTURE) ×1
SUT VIC AB 3-0 SH 27X BRD (SUTURE) ×1 IMPLANT
SUT VICRYL 2-0 SH 8X27 (SUTURE) IMPLANT
SUTURE MNCRL 4-0 27XMF (SUTURE) ×1 IMPLANT
SYR 10ML LL (SYRINGE) ×2 IMPLANT
SYR 20ML LL LF (SYRINGE) ×2 IMPLANT
SYR BULB IRRIG 60ML STRL (SYRINGE) ×2 IMPLANT

## 2019-08-11 NOTE — Discharge Instructions (Signed)
AMBULATORY SURGERY  DISCHARGE INSTRUCTIONS   1) The drugs that you were given will stay in your system until tomorrow so for the next 24 hours you should not:  A) Drive an automobile B) Make any legal decisions C) Drink any alcoholic beverage   2) You may resume regular meals tomorrow.  Today it is better to start with liquids and gradually work up to solid foods.  You may eat anything you prefer, but it is better to start with liquids, then soup and crackers, and gradually work up to solid foods.   3) Please notify your doctor immediately if you have any unusual bleeding, trouble breathing, redness and pain at the surgery site, drainage, fever, or pain not relieved by medication. 4)   5) Your post-operative visit with Dr.                                     is: Date:                        Time:    Please call to schedule your post-operative visit.  6) Additional Instructions:     Open Hernia Repair, Adult, Care After This sheet gives you information about how to care for yourself after your procedure. Your health care provider may also give you more specific instructions. If you have problems or questions, contact your health care provider. What can I expect after the procedure? After the procedure, it is common to have:  Mild discomfort.  Slight bruising.  Minor swelling.  Pain in the abdomen. Follow these instructions at home: Incision care   Follow instructions from your health care provider about how to take care of your incision area. Make sure you: ? Wash your hands with soap and water before you change your bandage (dressing). If soap and water are not available, use hand sanitizer. ? Change your dressing as told by your health care provider. ? Leave stitches (sutures), skin glue, or adhesive strips in place. These skin closures may need to stay in place for 2 weeks or longer. If adhesive strip edges start to loosen and curl up, you may trim the loose edges.  Do not remove adhesive strips completely unless your health care provider tells you to do that.  Check your incision area every day for signs of infection. Check for: ? More redness, swelling, or pain. ? More fluid or blood. ? Warmth. ? Pus or a bad smell. Activity  Do not drive or use heavy machinery while taking prescription pain medicine. Do not drive until your health care provider approves.  Until your health care provider approves: ? Do not lift anything that is heavier than 10 lb (4.5 kg). ? Do not play contact sports.  Return to your normal activities as told by your health care provider. Ask your health care provider what activities are safe. General instructions  To prevent or treat constipation while you are taking prescription pain medicine, your health care provider may recommend that you: ? Drink enough fluid to keep your urine clear or pale yellow. ? Take over-the-counter or prescription medicines. ? Eat foods that are high in fiber, such as fresh fruits and vegetables, whole grains, and beans. ? Limit foods that are high in fat and processed sugars, such as fried and sweet foods.  Take over-the-counter and prescription medicines only as told by your health care  provider.  Do not take tub baths or go swimming until your health care provider approves.  Keep all follow-up visits as told by your health care provider. This is important. Contact a health care provider if:  You develop a rash.  You have more redness, swelling, or pain around your incision.  You have more fluid or blood coming from your incision.  Your incision feels warm to the touch.  You have pus or a bad smell coming from your incision.  You have a fever or chills.  You have blood in your stool (feces).  You have not had a bowel movement in 2-3 days.  Your pain is not controlled with medicine. Get help right away if:  You have chest pain or shortness of breath.  You feel light-headed or  feel faint.  You have severe pain.  You vomit and your pain is worse. This information is not intended to replace advice given to you by your health care provider. Make sure you discuss any questions you have with your health care provider. Document Revised: 02/16/2017 Document Reviewed: 08/18/2015 Elsevier Patient Education  2020 [employer].

## 2019-08-11 NOTE — Transfer of Care (Signed)
Immediate Anesthesia Transfer of Care Note  Patient: Joanthony Tetter  Procedure(s) Performed: HERNIA REPAIR UMBILICAL ADULT (N/A )  Patient Location: PACU  Anesthesia Type:General  Level of Consciousness: drowsy  Airway & Oxygen Therapy: Patient Spontanous Breathing and Patient connected to face mask oxygen  Post-op Assessment: Report given to RN and Post -op Vital signs reviewed and stable  Post vital signs: Reviewed and stable  Last Vitals:  Vitals Value Taken Time  BP 141/99 08/11/19 1638  Temp    Pulse 84 08/11/19 1642  Resp 16 08/11/19 1642  SpO2 100 % 08/11/19 1642  Vitals shown include unvalidated device data.  Last Pain:  Vitals:   08/11/19 1131  TempSrc: Tympanic  PainSc: 4          Complications: No apparent anesthesia complications

## 2019-08-11 NOTE — Anesthesia Procedure Notes (Signed)
Procedure Name: Intubation Date/Time: 08/11/2019 3:24 PM Performed by: Lia Foyer, CRNA Pre-anesthesia Checklist: Patient identified, Emergency Drugs available, Suction available and Patient being monitored Patient Re-evaluated:Patient Re-evaluated prior to induction Oxygen Delivery Method: Circle system utilized Preoxygenation: Pre-oxygenation with 100% oxygen Induction Type: IV induction Ventilation: Mask ventilation with difficulty and Oral airway inserted - appropriate to patient size Laryngoscope Size: McGraph and 4 Grade View: Grade II Tube type: Oral Tube size: 7.5 mm Number of attempts: 1 Airway Equipment and Method: Stylet,  Oral airway and Video-laryngoscopy Placement Confirmation: ETT inserted through vocal cords under direct vision,  positive ETCO2 and breath sounds checked- equal and bilateral Secured at: 22 cm Tube secured with: Tape Dental Injury: Teeth and Oropharynx as per pre-operative assessment

## 2019-08-11 NOTE — Op Note (Signed)
Operative Note  Pre-operative Diagnosis: umbilical hernia, incarcerated  Post-operative Diagnosis: same  Operation: umbilical hernia repair without mesh  Surgeon: Fredirick Maudlin, MD  Anesthesia: GETA  Assistant: None   Findings: Tiny defect, less than 1 cm, with a large amount of incarcerated fat  Estimated Blood Loss: Less than 5 cc         Drains: None         Specimens: None          Complications: None immediately apparent         Condition: stable  Procedure Details  The patient was identified in the preoperative holding area. The benefits, complications, treatment options, and expected outcomes were discussed with the patient. The risks of bleeding, infection, recurrence of symptoms, failure to resolve symptoms, bowel injury, any of which could require further surgery were reviewed with the patient. The patient agreed to accept these risks. The patient was then taken to the operating room, identified as Douglas Bird and the procedure verified.  A time out was performed and the above information confirmed.  Prior to the induction of general anesthesia, antibiotic prophylaxis was administered. VTE prophylaxis was in place. General endotracheal anesthesia was then administered and tolerated well. After induction, the abdomen was prepped with Chloraprep and draped in standard sterile fashion. The patient was positioned in the supine position.  The skin and subcutaneous tissues surrounding the umbilicus were infiltrated with a one-to-one mixture of 0.25% bupivacaine and 1% lidocaine with epinephrine.  The umbilical incision was created over the hernia sac and electrocautery was used to dissect through subcutaneous tissue. The hernia was dissected free from adjacent tissue and fascia. The hernia sac was entered and the sac was excised. Care was taken to avoid any injury to the bowel.  The defect was tiny and there was a large amount of incarcerated fat.  I was unable to reduce the  bulk of the fat through the small defect.  I elected to amputate it.  The pedicle was ligated and the fat excised.  The defect was then closed primarily with interrupted 0 Ethibond sutures.  Liposomal bupivacaine was infiltrated along the fascial planes.  The cutaneous tissue was closed with 3-0 Vicryl and skin was closed with a subcuticular 4-0 Monocryl. Dermabond was applied to the skin, followed by Steri-Strips.   Patient tolerated procedure well and there were no immediate complications identified. Needle, instrument, and sponge counts were reported to be correct by the nursing staff.  Fredirick Maudlin, MD FACS

## 2019-08-11 NOTE — Anesthesia Preprocedure Evaluation (Signed)
Anesthesia Evaluation  Patient identified by MRN, date of birth, ID band Patient awake    Reviewed: Allergy & Precautions, NPO status , Patient's Chart, lab work & pertinent test results  History of Anesthesia Complications Negative for: history of anesthetic complications  Airway Mallampati: III  TM Distance: >3 FB Neck ROM: Full    Dental  (+) Teeth Intact, Dental Advisory Given, Poor Dentition   Pulmonary neg sleep apnea, neg COPD, Current Smoker and Patient abstained from smoking.,    Pulmonary exam normal breath sounds clear to auscultation       Cardiovascular Exercise Tolerance: Good METShypertension, Pt. on medications (-) CAD and (-) Past MI (-) dysrhythmias  Rhythm:Regular Rate:Normal - Systolic murmurs    Neuro/Psych negative neurological ROS  negative psych ROS   GI/Hepatic GERD  Controlled,(+)     (-) substance abuse  ,   Endo/Other  neg diabetesHypothyroidism   Renal/GU negative Renal ROS     Musculoskeletal   Abdominal   Peds  Hematology   Anesthesia Other Findings Past Medical History: 2011: Diverticulitis No date: GERD (gastroesophageal reflux disease) No date: Hyperlipidemia No date: Hypertension     Comment:  controlled on meds No date: Hypothyroidism No date: Thyroid disease  Reproductive/Obstetrics                             Anesthesia Physical Anesthesia Plan  ASA: II  Anesthesia Plan: General   Post-op Pain Management:    Induction: Intravenous  PONV Risk Score and Plan: 2 and Ondansetron, Dexamethasone and Midazolam  Airway Management Planned: Oral ETT  Additional Equipment: None  Intra-op Plan:   Post-operative Plan: Extubation in OR  Informed Consent: I have reviewed the patients History and Physical, chart, labs and discussed the procedure including the risks, benefits and alternatives for the proposed anesthesia with the patient or  authorized representative who has indicated his/her understanding and acceptance.     Dental advisory given  Plan Discussed with: CRNA and Surgeon  Anesthesia Plan Comments: (Discussed risks of anesthesia with patient, including PONV, sore throat, lip/dental damage. Rare risks discussed as well, such as cardiorespiratory and neurological sequelae. Patient understands.)        Anesthesia Quick Evaluation

## 2019-08-11 NOTE — Interval H&P Note (Signed)
History and Physical Interval Note:  08/11/2019 2:45 PM  Douglas Bird  has presented today for surgery, with the diagnosis of Incarcerated mbilical hernia.  The various methods of treatment have been discussed with the patient and family. After consideration of risks, benefits and other options for treatment, the patient has consented to  Procedure(s) with comments: [place] (N/A) - RNFA as a surgical intervention.  The patient's history has been reviewed, patient examined, no change in status, stable for surgery.  I have reviewed the patient's chart and labs.  Questions were answered to the patient's satisfaction.     Fredirick Maudlin

## 2019-08-12 NOTE — Anesthesia Postprocedure Evaluation (Signed)
Anesthesia Post Note  Patient: Douglas Bird  Procedure(s) Performed: HERNIA REPAIR UMBILICAL ADULT (N/A )  Patient location during evaluation: PACU Anesthesia Type: General Level of consciousness: awake and alert and oriented Pain management: pain level controlled Vital Signs Assessment: post-procedure vital signs reviewed and stable Respiratory status: spontaneous breathing Cardiovascular status: blood pressure returned to baseline Anesthetic complications: no     Last Vitals:  Vitals:   08/11/19 1708 08/11/19 1725  BP: 102/69 126/89  Pulse: 65 60  Resp: 11 16  Temp: (!) 36.1 C (!) 36.2 C  SpO2: 95% 96%    Last Pain:  Vitals:   08/11/19 1725  TempSrc: Temporal  PainSc: 4                  Shameka Aggarwal

## 2019-08-14 ENCOUNTER — Other Ambulatory Visit: Payer: Self-pay

## 2019-08-17 ENCOUNTER — Telehealth: Payer: Self-pay | Admitting: Urology

## 2019-08-17 NOTE — Telephone Encounter (Signed)
Douglas Bird was found to have external anal lesions during his colonoscopy for which they suggested Anusol.  They recommended that he have these lesions rechecked and if they have not resolved to refer on to general surgery.  I would like him to come in on Thursday so that I can exam him to see of the lesions have resolved.

## 2019-08-21 ENCOUNTER — Ambulatory Visit: Payer: Self-pay | Admitting: Urology

## 2019-08-21 ENCOUNTER — Other Ambulatory Visit: Payer: Self-pay

## 2019-08-21 VITALS — BP 118/81 | HR 72 | Temp 97.0°F | Ht 67.25 in | Wt 210.0 lb

## 2019-08-21 DIAGNOSIS — K649 Unspecified hemorrhoids: Secondary | ICD-10-CM

## 2019-08-21 DIAGNOSIS — K429 Umbilical hernia without obstruction or gangrene: Secondary | ICD-10-CM

## 2019-08-21 NOTE — Progress Notes (Signed)
Patient: Douglas Bird Male    DOB: [birth year]   54 y.o.   MRN: [id number] Visit Date: 08/21/2019  Today's Provider: [place]   Chief Complaint  Patient presents with  . hernia operation follow-up   Subjective:    HPI Douglas Bird is a 54 y.o.male who is status post umbilical hernia repair and external hemorrhoids who presents today for follow up.    He is status post umbilical hernia repair without mesh on 08/11/2019 with Dr. Celine Ahr.  His post operative course has been as expected and uneventful.  He is has been following Dr. Glenford Peers instructions to the letter as he wants to get better without complications.    He is also suffering with external hemorrhoids and has been trying not to strain to have a bowel movement.  He flushed himself with MiraLax and had a successful bowel movement.    He has follow up labs on 09/04/2019.  No Known Allergies Previous Medications   HYDROCODONE-ACETAMINOPHEN (NORCO/VICODIN) 5-325 MG TABLET    Take 1 tablet by mouth every 6 (six) hours as needed for moderate pain.   IBUPROFEN (ADVIL) 800 MG TABLET    Take 1 tablet (800 mg total) by mouth every 8 (eight) hours as needed.   LEVOTHYROXINE (SYNTHROID) 175 MCG TABLET    Take 175 mcg by mouth daily before breakfast.   LISINOPRIL (ZESTRIL) 10 MG TABLET    TAKE ONE TABLET BY MOUTH EVERY DAY   MELOXICAM (MOBIC) 7.5 MG TABLET    Take 1 tablet (7.5 mg total) by mouth daily.   OMEPRAZOLE (PRILOSEC) 20 MG CAPSULE    Take 1 capsule (20 mg total) by mouth daily.   SILDENAFIL (VIAGRA) 100 MG TABLET    Take 1 tablet (100 mg total) by mouth daily as needed for erectile dysfunction.   SIMVASTATIN (ZOCOR) 20 MG TABLET    Take 1 tablet (20 mg total) by mouth daily.    Review of Systems  Social History   Tobacco Use  . Smoking status: Current Every Day Smoker    Packs/day: 0.25    Years: 15.00    Pack years: 3.75    Types: Cigarettes  . Smokeless tobacco: Former Network engineer Use Topics  .  Alcohol use: Yes    Alcohol/week: 12.0 standard drinks    Types: 12 Cans of beer per week    Comment: Social Drinker   Objective:   BP 118/81   Pulse 72   Temp (!) 97 F (36.1 C)   Ht 5' 7.25" (1.708 m)   Wt 210 lb (95.3 kg)   SpO2 95%   BMI 32.65 kg/m   Physical Exam Constitutional:  Well nourished. Alert and oriented, No acute distress. HEENT: [place] AT, mask in place.  Trachea midline.   Cardiovascular: No clubbing, cyanosis, or edema. Respiratory: Normal respiratory effort, no increased work of breathing. GI: Abdomen is soft, non tender, non distended, no abdominal masses. Umbilical hernia repair site is clean and dry.     Rectal: External hemorrhoids.  See picture below.   Neurologic: Grossly intact, no focal deficits, moving all 4 extremities. Psychiatric: Normal mood and affect.      Assessment & Plan:   1. Umbilical hernia without obstruction and without gangrene - s/p umbilical hernia repair [id number] - doing well - has follow up on 09/04/2019 for recheck with Dr. Celine Ahr  2. Hemorrhoids, unspecified hemorrhoid type - will bleed from time to time - instructed not to strain for bowel movements -  will ask Dr. Glenford Peers opinion when he follows up with her   3. Health Maintenance - has a follow up for labs on 09/04/2019   [facility]

## 2019-09-04 ENCOUNTER — Other Ambulatory Visit: Payer: Self-pay

## 2019-09-04 ENCOUNTER — Ambulatory Visit (INDEPENDENT_AMBULATORY_CARE_PROVIDER_SITE_OTHER): Payer: Self-pay | Admitting: General Surgery

## 2019-09-04 ENCOUNTER — Encounter: Payer: Self-pay | Admitting: General Surgery

## 2019-09-04 VITALS — BP 146/95 | HR 77 | Temp 97.9°F | Ht 68.0 in | Wt 209.0 lb

## 2019-09-04 DIAGNOSIS — E039 Hypothyroidism, unspecified: Secondary | ICD-10-CM

## 2019-09-04 DIAGNOSIS — K420 Umbilical hernia with obstruction, without gangrene: Secondary | ICD-10-CM

## 2019-09-04 NOTE — Progress Notes (Signed)
Douglas Bird is here today for a postoperative visit.  He is a 54 year old man who had an umbilical hernia repair on Aug 11, 2019.  He states that since his operation, he has been doing well.  He still notices some tenderness in the area.  He denies any fevers or chills.  No nausea or vomiting.  Bowel function is normal.  He has not experienced any drainage from the site.  Today's Vitals   09/04/19 0851  BP: (!) 146/95  Pulse: 77  Temp: 97.9 F (36.6 C)  TempSrc: Temporal  SpO2: 98%  Weight: 209 lb (94.8 kg)  Height: 5\' 8"  (1.727 m)  PainSc: 0-No pain  PainLoc: Abdomen   Body mass index is 31.78 kg/m. Focused abdominal exam demonstrates a well approximated incision just cranial to the umbilicus.  There is no erythema, induration, or drainage present.  No concern for seroma or hernia recurrence  Pression plan: This is a 54 year old man who is 3 weeks postop from an umbilical hernia repair.  He is doing well.  He may resume all of his usual activities with the exception that he should continue to avoid lifting, pushing, or pulling anything over 10 pounds for another 3 weeks.  He was provided a work note to this effect.  We will see him on an as-needed basis

## 2019-09-04 NOTE — Patient Instructions (Addendum)
Patient refrain from any heavy lifting, bending, pulling or pushing of 10 pounds or more for a total of six weeks.  Follow-up with our office as needed.  Please call and ask to speak with a nurse if you develop questions or concerns.  GENERAL POST-OPERATIVE PATIENT INSTRUCTIONS   FOLLOW-UP:  Please make an appointment with your physician in.  Call your physician immediately if you have any fevers greater than 102.5, drainage from you wound that is not clear or looks infected, persistent bleeding, increasing abdominal pain, problems urinating, or persistent nausea/vomiting.    WOUND CARE INSTRUCTIONS:  Keep a dry clean dressing on the wound if there is drainage. The initial bandage may be removed after 24 hours.  Once the wound has quit draining you may leave it open to air.  If clothing rubs against the wound or causes irritation and the wound is not draining you may cover it with a dry dressing during the daytime.  Try to keep the wound dry and avoid ointments on the wound unless directed to do so.  If the wound becomes bright red and painful or starts to drain infected material that is not clear, please contact your physician immediately.  If the wound is mildly pink and has a thick firm ridge underneath it, this is normal, and is referred to as a healing ridge.  This will resolve over the next 4-6 weeks.  DIET:  You may eat any foods that you can tolerate.  It is a good idea to eat a high fiber diet and take in plenty of fluids to prevent constipation.  If you do become constipated you may want to take a mild laxative or take ducolax tablets on a daily basis until your bowel habits are regular.  Constipation can be very uncomfortable, along with straining, after recent surgery.  ACTIVITY:  You are encouraged to cough and deep breath or use your incentive spirometer if you were given one, every 15-30 minutes when awake.  This will help prevent respiratory complications and low grade fevers  post-operatively if you had a general anesthetic.  You may want to hug a pillow when coughing and sneezing to add additional support to the surgical area, if you had abdominal or chest surgery, which will decrease pain during these times.  You are encouraged to walk and engage in light activity for the next two weeks.  You should not lift more than 20 pounds during this time frame as it could put you at increased risk for complications.  Twenty pounds is roughly equivalent to a plastic bag of groceries.    MEDICATIONS:  Try to take narcotic medications and anti-inflammatory medications, such as tylenol, ibuprofen, naprosyn, etc., with food.  This will minimize stomach upset from the medication.  Should you develop nausea and vomiting from the pain medication, or develop a rash, please discontinue the medication and contact your physician.  You should not drive, make important decisions, or operate machinery when taking narcotic pain medication.  QUESTIONS:  Please feel free to call your physician or the hospital operator if you have any questions, and they will be glad to assist you.

## 2019-09-05 LAB — TSH: TSH: 0.71 u[IU]/mL (ref 0.450–4.500)

## 2019-10-23 ENCOUNTER — Other Ambulatory Visit: Payer: Self-pay

## 2019-10-23 DIAGNOSIS — K429 Umbilical hernia without obstruction or gangrene: Secondary | ICD-10-CM

## 2019-10-24 LAB — LIPID PANEL
Chol/HDL Ratio: 5 ratio (ref 0.0–5.0)
Cholesterol, Total: 212 mg/dL — ABNORMAL HIGH (ref 100–199)
HDL: 42 mg/dL
LDL Chol Calc (NIH): 132 mg/dL — ABNORMAL HIGH (ref 0–99)
Triglycerides: 210 mg/dL — ABNORMAL HIGH (ref 0–149)
VLDL Cholesterol Cal: 38 mg/dL (ref 5–40)

## 2019-10-24 LAB — HEMOGLOBIN A1C
Est. average glucose Bld gHb Est-mCnc: 103 mg/dL
Hgb A1c MFr Bld: 5.2 % (ref 4.8–5.6)

## 2019-10-24 LAB — TSH: TSH: 3.83 u[IU]/mL (ref 0.450–4.500)

## 2019-10-30 ENCOUNTER — Other Ambulatory Visit: Payer: Self-pay

## 2019-10-30 ENCOUNTER — Ambulatory Visit: Payer: Self-pay | Admitting: Family Medicine

## 2019-10-30 VITALS — BP 139/89 | Wt 210.8 lb

## 2019-10-30 DIAGNOSIS — E039 Hypothyroidism, unspecified: Secondary | ICD-10-CM

## 2019-10-30 DIAGNOSIS — E782 Mixed hyperlipidemia: Secondary | ICD-10-CM

## 2019-10-30 DIAGNOSIS — I10 Essential (primary) hypertension: Secondary | ICD-10-CM

## 2019-10-30 DIAGNOSIS — K219 Gastro-esophageal reflux disease without esophagitis: Secondary | ICD-10-CM

## 2019-10-30 MED ORDER — LEVOTHYROXINE SODIUM 150 MCG PO TABS: 150.0000 ug | ORAL_TABLET | Freq: Every day | ORAL | 1 refills | Status: DC

## 2019-10-30 MED ORDER — SIMVASTATIN 20 MG PO TABS: 20.0000 mg | ORAL_TABLET | Freq: Every day | ORAL | 1 refills | Status: DC

## 2019-10-30 MED ORDER — OMEPRAZOLE 20 MG PO CPDR: 20.0000 mg | DELAYED_RELEASE_CAPSULE | Freq: Every day | ORAL | 1 refills | Status: DC

## 2019-10-30 MED ORDER — LISINOPRIL 20 MG PO TABS: 20.0000 mg | ORAL_TABLET | Freq: Every day | ORAL | 1 refills | Status: DC

## 2019-10-30 MED ORDER — SILDENAFIL CITRATE 100 MG PO TABS: 100.0000 mg | ORAL_TABLET | Freq: Every day | ORAL | 1 refills | Status: DC | PRN

## 2019-10-30 NOTE — Progress Notes (Signed)
Established Patient Office Visit  Subjective:  Patient ID: Douglas Bird, male    DOB: 04/19/1965  Age: 54 y.o. MRN: [id number]  CC:  Chief Complaint  Patient presents with  . Follow-up    HPI Douglas Bird presents for recheck. 1.  Hypothyroid.  Is on synthroid 150 ug qd. No change in weight, BM, temperature sensitivity.  TSH was nl last week. 2.  HTN.  Is on lisinopril daily.  BP usually runs around 130's/80's.  No chest pain, SOB, swelling. 3.  Hyperlipidemia.  In on simvastatin.  Lipids last week were fairly well controlled. No side effects on meds. 4.  Tobacco abuse.  Has smoked 8 years.  Was on 1 PPD, have cut down to a pack per week.   Past Medical History:  Diagnosis Date  . Diverticulitis 2011  . GERD (gastroesophageal reflux disease)   . Hyperlipidemia   . Hypertension    controlled on meds  . Hypothyroidism   . Thyroid disease     Past Surgical History:  Procedure Laterality Date  . CHOLECYSTECTOMY  June 2013  . COLONOSCOPY WITH PROPOFOL N/A 04/28/2019   Procedure: COLONOSCOPY WITH PROPOFOL;  Surgeon: Lin Landsman, MD;  Location: [place];  Service: Endoscopy;  Laterality: N/A;  priority 4  . HERNIA REPAIR Bilateral    inguinal  . UMBILICAL HERNIA REPAIR N/A 08/11/2019   Procedure: HERNIA REPAIR UMBILICAL ADULT;  Surgeon: Fredirick Maudlin, MD;  Location: [facility];  Service: General;  Laterality: N/A;  RNFA    Family History  Problem Relation Age of Onset  . Stroke Father   . Gout Father   . Aneurysm Father        2006  . Thyroid disease Father   . Epilepsy Father   . Thyroid disease Sister   . Diabetes Paternal Grandfather   . Cancer Mother     Social History   Socioeconomic History  . Marital status: Significant Other    Spouse name: Not on file  . Number of children: Not on file  . Years of education: Not on file  . Highest education level: Not on file  Occupational History  . Not on file  Tobacco Use  . Smoking status:  Current Every Day Smoker    Packs/day: 0.25    Years: 15.00    Pack years: 3.75    Types: Cigarettes  . Smokeless tobacco: Former Systems developer  . Tobacco comment: about a pack a week, has been cutting back  Vaping Use  . Vaping Use: Never used  Substance and Sexual Activity  . Alcohol use: Yes    Alcohol/week: 12.0 standard drinks    Types: 12 Cans of beer per week    Comment: 12 a week "if that"  . Drug use: No  . Sexual activity: Not Currently  Other Topics Concern  . Not on file  Social History Narrative  . Not on file   Social Determinants of Health   Financial Resource Strain: Low Risk   . Difficulty of Paying Living Expenses: Not very hard  Food Insecurity: No Food Insecurity  . Worried About Charity fundraiser in the Last Year: Never true  . Ran Out of Food in the Last Year: Never true  Transportation Needs: No Transportation Needs  . Lack of Transportation (Medical): No  . Lack of Transportation (Non-Medical): No  Physical Activity: Sufficiently Active  . Days of Exercise per Week: 7 days  . Minutes of Exercise per Session: 60  min  Stress: No Stress Concern Present  . Feeling of Stress : Not at all  Social Connections: Moderately Integrated  . Frequency of Communication with Friends and Family: More than three times a week  . Frequency of Social Gatherings with Friends and Family: More than three times a week  . Attends Religious Services: More than 4 times per year  . Active Member of Clubs or Organizations: No  . Attends Archivist Meetings: Never  . Marital Status: Living with partner  Intimate Partner Violence:   . Fear of Current or Ex-Partner:   . Emotionally Abused:   Marland Kitchen Physically Abused:   . Sexually Abused:     Outpatient Medications Prior to Visit  Medication Sig Dispense Refill  . levothyroxine (SYNTHROID) 175 MCG tablet Take 150 mcg by mouth daily before breakfast.     . lisinopril (ZESTRIL) 10 MG tablet TAKE ONE TABLET BY MOUTH EVERY DAY  (Patient taking differently: Take 10 mg by mouth daily. ) 90 tablet 0  . meloxicam (MOBIC) 7.5 MG tablet Take 1 tablet (7.5 mg total) by mouth daily. (Patient taking differently: Take 7.5 mg by mouth daily as needed for pain. ) 90 tablet 0  . omeprazole (PRILOSEC) 20 MG capsule Take 1 capsule (20 mg total) by mouth daily. (Patient taking differently: Take 20 mg by mouth daily with lunch. ) 90 capsule 0  . sildenafil (VIAGRA) 100 MG tablet Take 1 tablet (100 mg total) by mouth daily as needed for erectile dysfunction. 10 tablet 0  . simvastatin (ZOCOR) 20 MG tablet Take 1 tablet (20 mg total) by mouth daily. (Patient taking differently: Take 20 mg by mouth daily at 4 PM. ) 90 tablet 0  . HYDROcodone-acetaminophen (NORCO/VICODIN) 5-325 MG tablet Take 1 tablet by mouth every 6 (six) hours as needed for moderate pain. (Patient not taking: Reported on 10/30/2019) 10 tablet 0  . ibuprofen (ADVIL) 800 MG tablet Take 1 tablet (800 mg total) by mouth every 8 (eight) hours as needed. (Patient not taking: Reported on 10/30/2019) 30 tablet 0   No facility-administered medications prior to visit.    No Known Allergies  ROS Review of Systems    Objective:    Physical Exam  BP 139/89 (BP Location: Left Arm, Patient Position: Sitting)   Wt 210 lb 12.8 oz (95.6 kg)   SpO2 97%   BMI 32.05 kg/m  Wt Readings from Last 3 Encounters:  10/30/19 210 lb 12.8 oz (95.6 kg)  09/09/19 208 lb 1.6 oz (94.4 kg)  09/04/19 209 lb (94.8 kg)   A+O Conj clear. RRR CTA NT/ND +2 pulses  Health Maintenance Due  Topic Date Due  . Hepatitis C Screening  Never done  . PNEUMOCOCCAL POLYSACCHARIDE VACCINE AGE 67-64 HIGH RISK  Never done  . FOOT EXAM  Never done  . OPHTHALMOLOGY EXAM  Never done  . COVID-19 Vaccine (1) Never done  . HIV Screening  Never done  . TETANUS/TDAP  Never done  . INFLUENZA VACCINE  10/19/2019    There are no preventive care reminders to display for this patient.  Lab Results  Component  Value Date   TSH 3.830 10/23/2019   Lab Results  Component Value Date   WBC 6.9 11/01/2017   HGB 14.7 11/01/2017   HCT 44.3 11/01/2017   MCV 93 11/01/2017   PLT 179 11/01/2017   Lab Results  Component Value Date   NA 141 10/03/2018   K 3.9 10/03/2018   CO2 24 10/03/2018  GLUCOSE 88 10/03/2018   BUN 13 10/03/2018   CREATININE 0.99 10/03/2018   BILITOT 0.4 05/16/2018   ALKPHOS 87 05/16/2018   AST 18 05/16/2018   ALT 20 05/16/2018   PROT 6.5 05/16/2018   ALBUMIN 4.3 05/16/2018   CALCIUM 9.0 10/03/2018   ANIONGAP 12 03/10/2017   Lab Results  Component Value Date   CHOL 212 (H) 10/23/2019   Lab Results  Component Value Date   HDL 42 10/23/2019   Lab Results  Component Value Date   LDLCALC 132 (H) 10/23/2019   Lab Results  Component Value Date   TRIG 210 (H) 10/23/2019   Lab Results  Component Value Date   CHOLHDL 5.0 10/23/2019   Lab Results  Component Value Date   HGBA1C 5.2 10/23/2019      Assessment & Plan:   Problem List Items Addressed This Visit      Cardiovascular and Mediastinum   Benign hypertension     Other   Mixed hyperlipidemia    Other Visit Diagnoses    Chronic GERD        HTN.  Increase Lisinopril to 20 mg a day. Hypothyroidism.  Stay on synthroid 150 ug daily Hyperlipidemia. Stay on simvastatin 20 mg qd. Tobacco abuse.  Quit smoking. Use Sildenafil as needed. Labs and f/u in 3 months  No orders of the defined types were placed in this encounter.   Follow-up: No follow-ups on file.    Juluis Pitch, MD

## 2019-10-30 NOTE — Patient Instructions (Signed)
Increase Lisnopril to 20 mg a day. Stay physically active. Try to quit smoking completely.i

## 2020-02-05 ENCOUNTER — Other Ambulatory Visit: Payer: Self-pay

## 2020-02-05 DIAGNOSIS — E039 Hypothyroidism, unspecified: Secondary | ICD-10-CM

## 2020-02-05 DIAGNOSIS — I10 Essential (primary) hypertension: Secondary | ICD-10-CM

## 2020-02-06 LAB — CBC WITH DIFFERENTIAL/PLATELET
Basophils Absolute: 0 10*3/uL (ref 0.0–0.2)
Basos: 1 %
EOS (ABSOLUTE): 0.3 10*3/uL (ref 0.0–0.4)
Eos: 4 %
Hematocrit: 42.6 % (ref 37.5–51.0)
Hemoglobin: 14.8 g/dL (ref 13.0–17.7)
Immature Grans (Abs): 0 10*3/uL (ref 0.0–0.1)
Immature Granulocytes: 0 %
Lymphocytes Absolute: 1.6 10*3/uL (ref 0.7–3.1)
Lymphs: 27 %
MCH: 32 pg (ref 26.6–33.0)
MCHC: 34.7 g/dL (ref 31.5–35.7)
MCV: 92 fL (ref 79–97)
Monocytes Absolute: 0.7 10*3/uL (ref 0.1–0.9)
Monocytes: 11 %
Neutrophils Absolute: 3.4 10*3/uL (ref 1.4–7.0)
Neutrophils: 57 %
Platelets: 203 10*3/uL (ref 150–450)
RBC: 4.62 x10E6/uL (ref 4.14–5.80)
RDW: 12.1 % (ref 11.6–15.4)
WBC: 6 10*3/uL (ref 3.4–10.8)

## 2020-02-06 LAB — COMPREHENSIVE METABOLIC PANEL
ALT: 16 IU/L (ref 0–44)
AST: 18 IU/L (ref 0–40)
Albumin/Globulin Ratio: 2 (ref 1.2–2.2)
Albumin: 4.4 g/dL (ref 3.8–4.9)
Alkaline Phosphatase: 85 IU/L (ref 44–121)
BUN/Creatinine Ratio: 16 (ref 9–20)
BUN: 16 mg/dL (ref 6–24)
Bilirubin Total: 0.3 mg/dL (ref 0.0–1.2)
CO2: 23 mmol/L (ref 20–29)
Calcium: 9.5 mg/dL (ref 8.7–10.2)
Chloride: 104 mmol/L (ref 96–106)
Creatinine, Ser: 1.02 mg/dL (ref 0.76–1.27)
GFR calc Af Amer: 95 mL/min/{1.73_m2} (ref >59)
GFR calc non Af Amer: 82 mL/min/{1.73_m2} (ref >59)
Globulin, Total: 2.2 g/dL (ref 1.5–4.5)
Glucose: 87 mg/dL (ref 65–99)
Potassium: 4.1 mmol/L (ref 3.5–5.2)
Sodium: 143 mmol/L (ref 134–144)
Total Protein: 6.6 g/dL (ref 6.0–8.5)

## 2020-02-06 LAB — TSH: TSH: 2.68 u[IU]/mL (ref 0.450–4.500)

## 2020-02-06 IMAGING — DX RIGHT KNEE - COMPLETE 4+ VIEW
4 series · 4 of 4 positions shown · non-contrast
Comparison: None.

CLINICAL DATA: Right knee pain for 5 weeks

EXAM:
RIGHT KNEE - COMPLETE 4+ VIEW

[knee obl (1 of 2)]
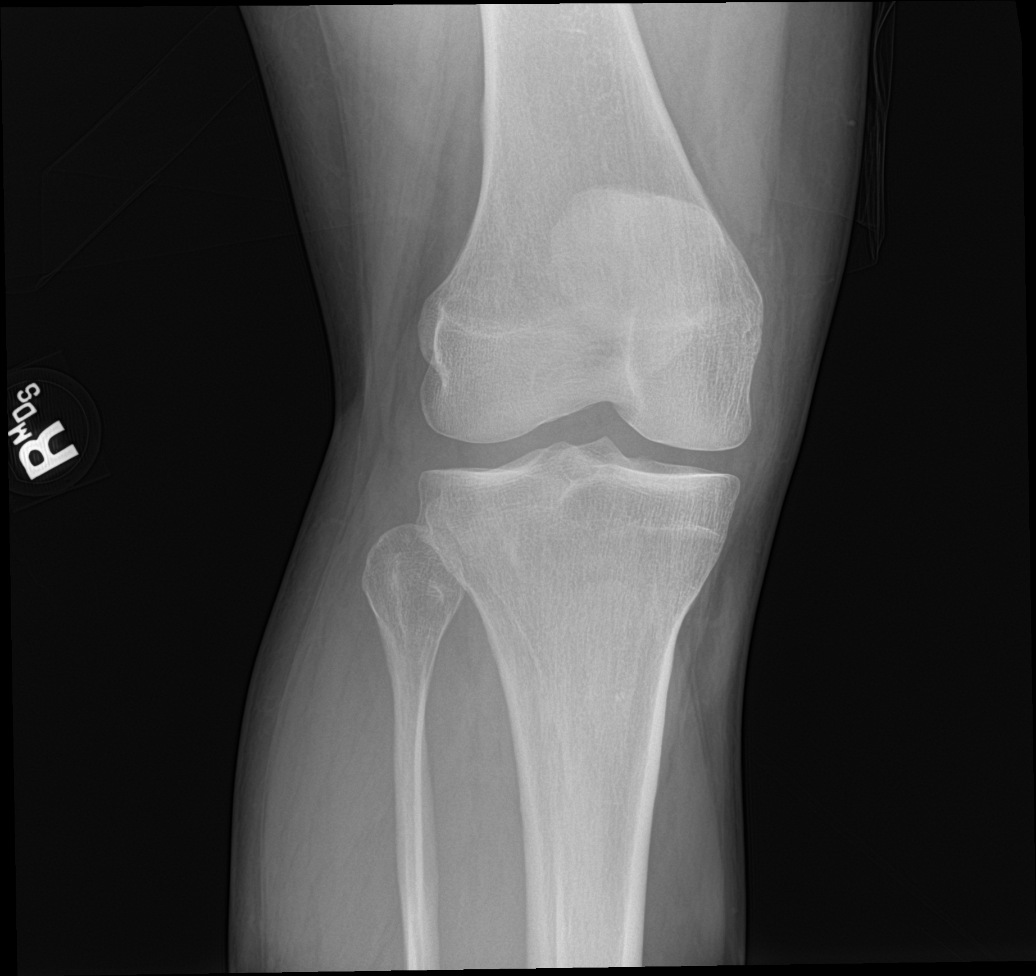

[knee lat]
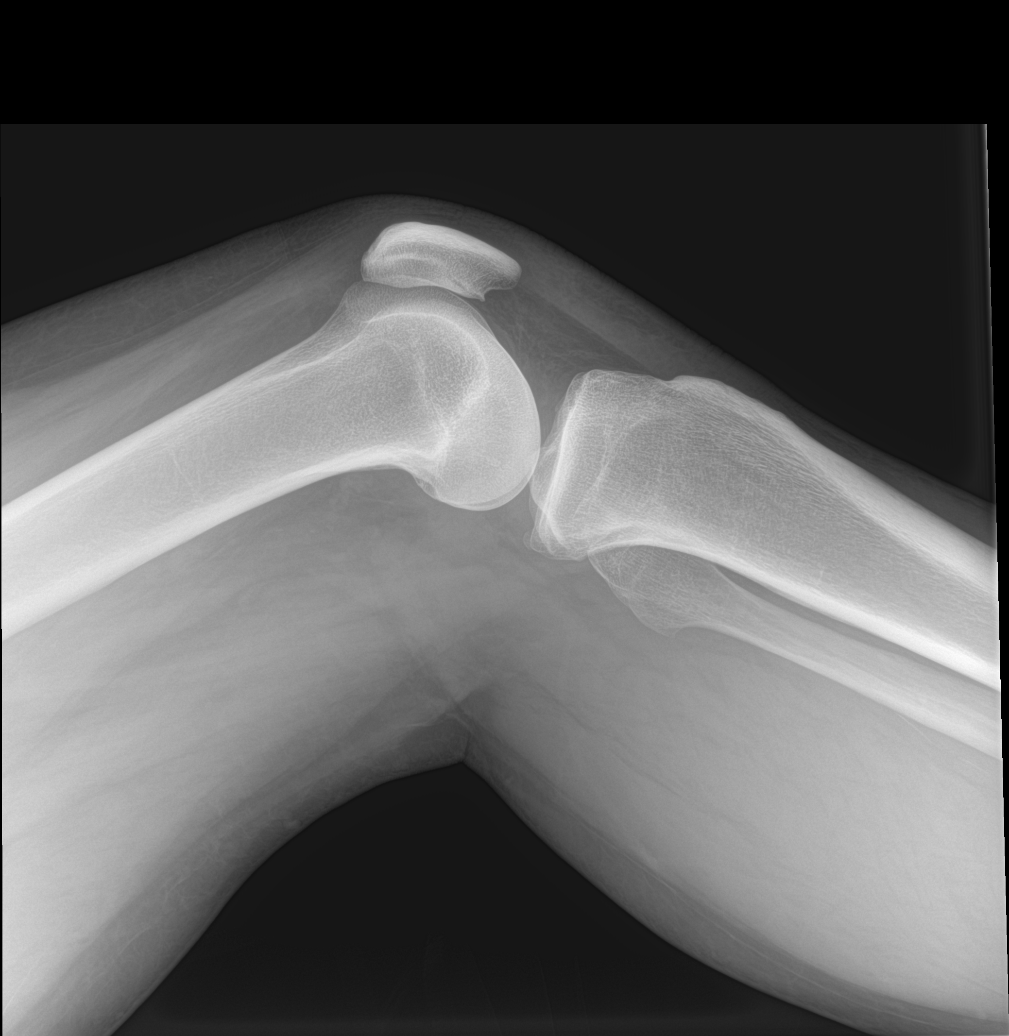

[knee obl (2 of 2)]
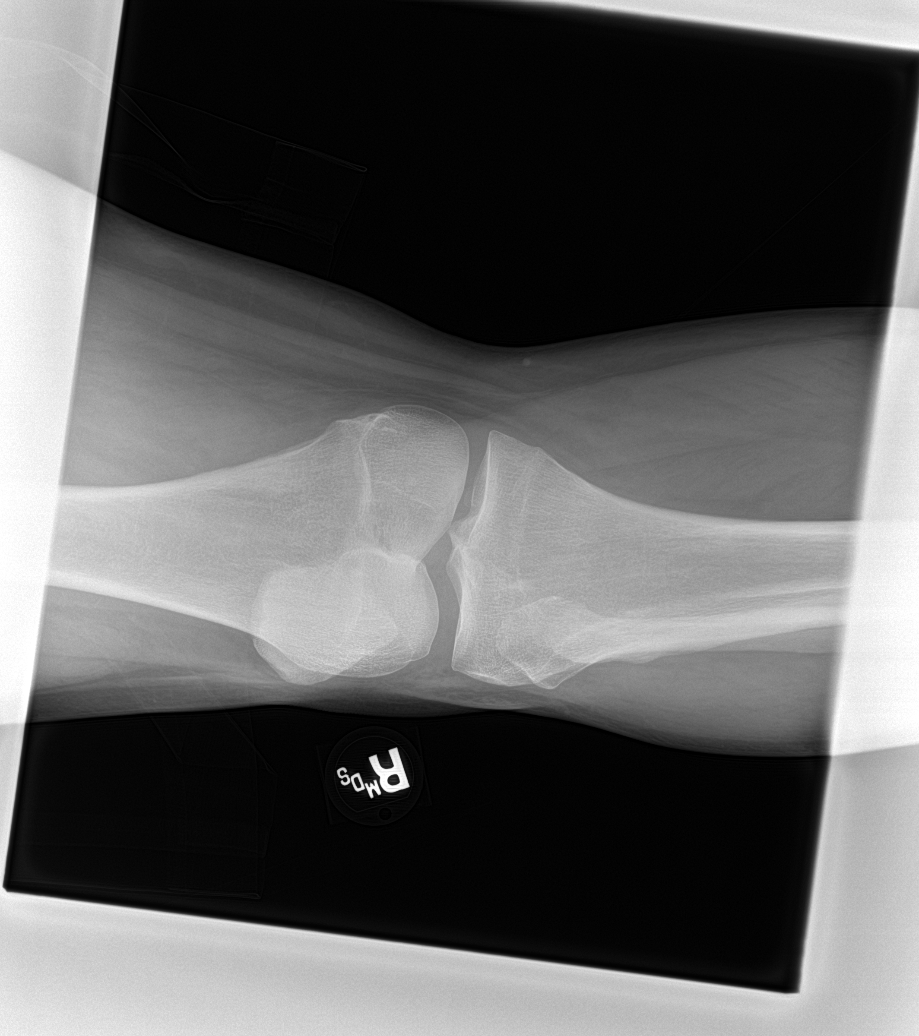

[knee ap]
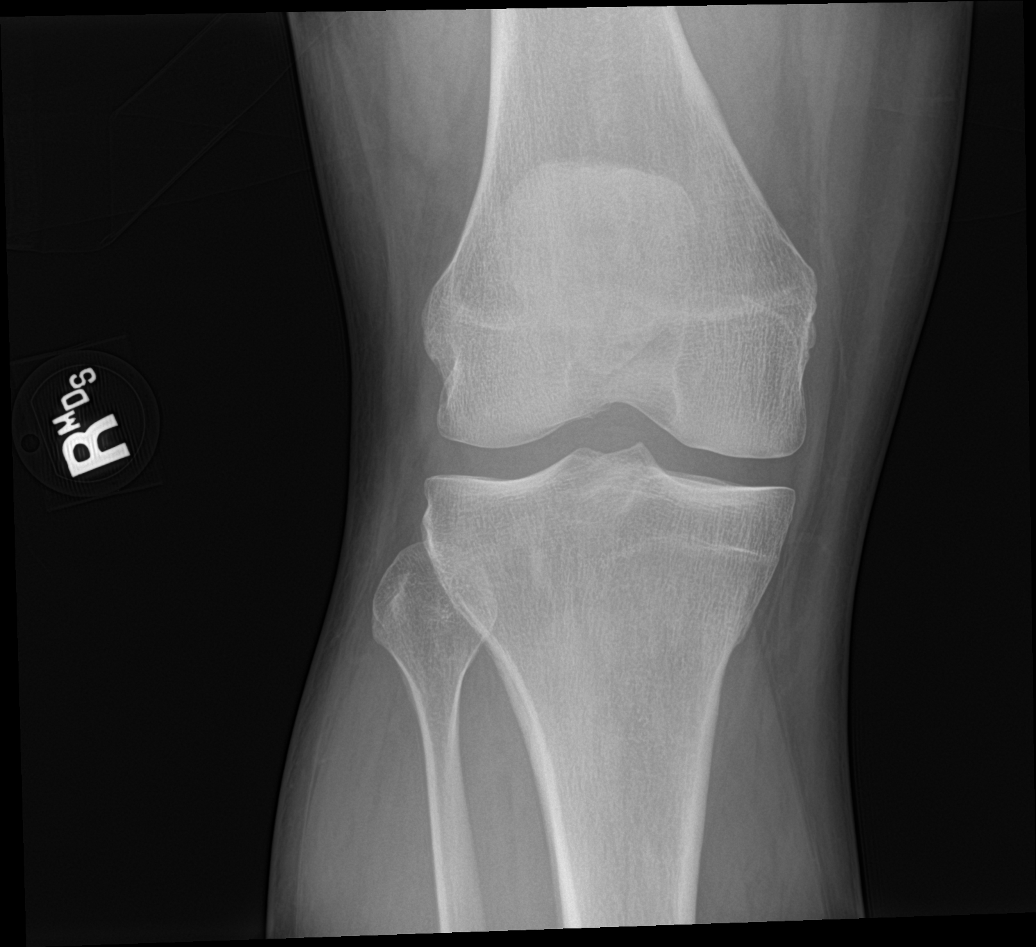

[4 of 4 positions shown; findings below may reference images not displayed]

FINDINGS: No acute fracture or dislocation. No aggressive osseous lesion. No
joint effusion. Normal soft tissues.
IMPRESSION: No acute osseous injury of the right knee.

## 2020-02-19 ENCOUNTER — Ambulatory Visit: Payer: Self-pay | Admitting: Family Medicine

## 2020-02-19 ENCOUNTER — Other Ambulatory Visit: Payer: Self-pay

## 2020-02-19 ENCOUNTER — Other Ambulatory Visit: Payer: Self-pay | Admitting: Family Medicine

## 2020-02-19 VITALS — BP 151/96 | Ht 66.0 in | Wt 209.0 lb

## 2020-02-19 DIAGNOSIS — K0889 Other specified disorders of teeth and supporting structures: Secondary | ICD-10-CM

## 2020-02-19 DIAGNOSIS — E785 Hyperlipidemia, unspecified: Secondary | ICD-10-CM

## 2020-02-19 DIAGNOSIS — I10 Essential (primary) hypertension: Secondary | ICD-10-CM

## 2020-02-19 DIAGNOSIS — K219 Gastro-esophageal reflux disease without esophagitis: Secondary | ICD-10-CM

## 2020-02-19 DIAGNOSIS — F17200 Nicotine dependence, unspecified, uncomplicated: Secondary | ICD-10-CM

## 2020-02-19 DIAGNOSIS — R519 Headache, unspecified: Secondary | ICD-10-CM

## 2020-02-19 DIAGNOSIS — E782 Mixed hyperlipidemia: Secondary | ICD-10-CM

## 2020-02-19 MED ORDER — AMOXICILLIN-POT CLAVULANATE 875-125 MG PO TABS
1.0000 | ORAL_TABLET | Freq: Two times a day (BID) | ORAL | 0 refills | Status: AC
Start: 2020-02-19 — End: 2020-02-29

## 2020-02-19 MED ORDER — SILDENAFIL CITRATE 100 MG PO TABS: 100.0000 mg | ORAL_TABLET | Freq: Every day | ORAL | 1 refills | Status: DC | PRN

## 2020-02-19 MED ORDER — IBUPROFEN 800 MG PO TABS: 800.0000 mg | ORAL_TABLET | Freq: Three times a day (TID) | ORAL | 0 refills | Status: DC | PRN

## 2020-02-19 MED ORDER — SIMVASTATIN 20 MG PO TABS: 20.0000 mg | ORAL_TABLET | Freq: Every day | ORAL | 1 refills | Status: DC

## 2020-02-19 MED ORDER — LISINOPRIL 20 MG PO TABS: 20.0000 mg | ORAL_TABLET | Freq: Every day | ORAL | 1 refills | Status: DC

## 2020-02-19 MED ORDER — OMEPRAZOLE 20 MG PO CPDR: 20.0000 mg | DELAYED_RELEASE_CAPSULE | Freq: Every day | ORAL | 1 refills | Status: DC

## 2020-02-19 MED ORDER — LEVOTHYROXINE SODIUM 150 MCG PO TABS: 150.0000 ug | ORAL_TABLET | Freq: Every day | ORAL | 1 refills | Status: DC

## 2020-02-19 MED ORDER — HYDROCHLOROTHIAZIDE 25 MG PO TABS
12.5000 mg | ORAL_TABLET | Freq: Every day | ORAL | 1 refills | Status: DC
Start: 2020-02-19 — End: 2020-04-01

## 2020-02-19 NOTE — Patient Instructions (Addendum)
Antibiotics for your toothache.  We'll add on Douglas Bird new blood pressure medicine for you.    Follow up in 1 month.  Quitting smoking is extremely important.  We'll get labs before your next appointment.

## 2020-02-19 NOTE — Progress Notes (Signed)
Problem List Items Addressed This Visit      Cardiovascular and Mediastinum   Benign hypertension    Elevated BP today Add HCTZ 12.5.  Continue lisinopril 20 mg. Follow up in about 1 month      Relevant Medications   lisinopril (ZESTRIL) 20 MG tablet   sildenafil (VIAGRA) 100 MG tablet   simvastatin (ZOCOR) 20 MG tablet   hydrochlorothiazide (HYDRODIURIL) 25 MG tablet   Other Relevant Orders   Basic Metabolic Panel (BMET)     Other   Mixed hyperlipidemia    Elevated LDL at last check Follow repeat lipid panel and consider escalating statin   The 10-year ASCVD risk score Douglas Bussing DC Jr., Douglas al., Douglas Bird) is: 34.2%   Values used to calculate the score:     Age: 55 years     Sex: Male     Is Non-Hispanic African American: No     Diabetic: Yes     Tobacco smoker: Yes     Systolic Blood Pressure: 409 mmHg     Is BP treated: Yes     HDL Cholesterol: 42 mg/dL     Total Cholesterol: 212 mg/dL       Relevant Medications   lisinopril (ZESTRIL) 20 MG tablet   sildenafil (VIAGRA) 100 MG tablet   simvastatin (ZOCOR) 20 MG tablet   hydrochlorothiazide (HYDRODIURIL) 25 MG tablet   Toothache    He's got an appt with dentist on Dec 17th Suspect this is culprit of his headaches Will give course of augmentin       Headache    L posterior HA Suspect this is due to upper tooth as I was able to reproduce pain Augmentin ED precautions given if recurrent       Relevant Medications   ibuprofen (ADVIL) 800 MG tablet   Smoking    Encouraged cessation He's going to try patches Follow        Other Visit Diagnoses    Dyslipidemia    -  Primary   Relevant Medications   simvastatin (ZOCOR) 20 MG tablet   Other Relevant Orders   Lipid Profile   Chronic GERD       Relevant Medications   omeprazole (PRILOSEC) 20 MG capsule       Established Patient Office Visit  Subjective:  Patient ID: Douglas Bird, male    DOB: 19-May-1965  Age: 54 y.o. MRN: [id number]  CC:  Chief Complaint   Patient presents with  . Labs Only    Lab Follow up; med refill  . Headache    Not current complaint- occasional, severe, non-triggered L sided head pain; may be related to tooth issue     HPI Douglas Bird presents for   Med refill Lab follow up Headache/toothpain   Headache, tooth pain - about 3 weeks ago, hurt tooth while eating chicken.  Since then, has had intermittent headaches, back L head about past 3 weeks - 2x/Bird - sharp, lasts for hrs.  Goes away gradually, nothing really makes it better - medicine helps Kenwood Rosiak little - ibuprofen, tylenol PM - sometimes bending over makes it worse - no numbness, tingling, weakness - no light sensitivity - last time was Monday night-afternoon - behind L ear - has broken tooth on that side   Hypertension: taking his meds - no CP, SOB.    Working on quitting smoking   Past Medical History:  Diagnosis Date  . Diverticulitis 2011  . GERD (gastroesophageal reflux disease)   . Hyperlipidemia   .  Hypertension    controlled on meds  . Hypothyroidism   . Thyroid disease     Past Surgical History:  Procedure Laterality Date  . CHOLECYSTECTOMY  June Douglas Bird  . COLONOSCOPY WITH PROPOFOL N/Douglas Bird 04/28/2019   Procedure: COLONOSCOPY WITH PROPOFOL;  Surgeon: Douglas Landsman, Douglas Bird;  Location: [place];  Service: Endoscopy;  Laterality: N/Douglas Bird;  priority 4  . HERNIA REPAIR Bilateral    inguinal  . UMBILICAL HERNIA REPAIR N/Douglas Bird 08/11/2019   Procedure: HERNIA REPAIR UMBILICAL ADULT;  Surgeon: Douglas Maudlin, Douglas Bird;  Location: [facility];  Service: General;  Laterality: N/Douglas Bird;  Douglas Bird    Family History  Problem Relation Age of Onset  . Stroke Father   . Gout Father   . Aneurysm Father        2006  . Thyroid disease Father   . Epilepsy Father   . Thyroid disease Sister   . Diabetes Paternal Grandfather   . Cancer Mother     Social History   Socioeconomic History  . Marital status: Significant Other    Spouse name: Not on file  . Number of  children: Not on file  . Years of education: Not on file  . Highest education level: Not on file  Occupational History  . Not on file  Tobacco Use  . Smoking status: Current Every Day Smoker    Packs/day: 0.25    Years: 15.00    Pack years: 3.75    Types: Cigarettes  . Smokeless tobacco: Former Systems developer  . Tobacco comment: about Douglas Bird, has been cutting back  Vaping Use  . Vaping Use: Never used  Substance and Sexual Activity  . Alcohol use: Yes    Alcohol/Bird: 12.0 standard drinks    Types: 12 Cans of beer per Bird    Comment: 12 Douglas Bird "if that"  . Drug use: No  . Sexual activity: Not Currently  Other Topics Concern  . Not on file  Social History Narrative  . Not on file   Social Determinants of Health   Financial Resource Strain: Low Risk   . Difficulty of Paying Living Expenses: Not very hard  Food Insecurity: No Food Insecurity  . Worried About Charity fundraiser in the Last Year: Never true  . Ran Out of Food in the Last Year: Never true  Transportation Needs: No Transportation Needs  . Lack of Transportation (Medical): No  . Lack of Transportation (Non-Medical): No  Physical Activity: Sufficiently Active  . Days of Exercise per Bird: 7 days  . Minutes of Exercise per Session: 60 min  Stress: No Stress Concern Present  . Feeling of Stress : Not at all  Social Connections: Moderately Integrated  . Frequency of Communication with Friends and Family: More than three times Nevah Dalal Bird  . Frequency of Social Gatherings with Friends and Family: More than three times Kaeleen Odom Bird  . Attends Religious Services: More than 4 times per year  . Active Member of Clubs or Organizations: No  . Attends Archivist Meetings: Never  . Marital Status: Living with partner  Intimate Partner Violence:   . Fear of Current or Ex-Partner: Not on file  . Emotionally Abused: Not on file  . Physically Abused: Not on file  . Sexually Abused: Not on file    Outpatient Medications  Prior to Visit  Medication Sig Dispense Refill  . ibuprofen (ADVIL) 800 MG tablet Take 1 tablet (800 mg total) by mouth every 8 (eight)  hours as needed. 30 tablet 0  . levothyroxine (SYNTHROID) 150 MCG tablet Take 1 tablet (150 mcg total) by mouth daily before breakfast. 90 tablet 1  . lisinopril (ZESTRIL) 20 MG tablet Take 1 tablet (20 mg total) by mouth daily. 90 tablet 1  . omeprazole (PRILOSEC) 20 MG capsule Take 1 capsule (20 mg total) by mouth daily. 90 capsule 1  . sildenafil (VIAGRA) 100 MG tablet Take 1 tablet (100 mg total) by mouth daily as needed for erectile dysfunction. 10 tablet 1  . simvastatin (ZOCOR) 20 MG tablet Take 1 tablet (20 mg total) by mouth daily. 90 tablet 1  . HYDROcodone-acetaminophen (NORCO/VICODIN) 5-325 MG tablet Take 1 tablet by mouth every 6 (six) hours as needed for moderate pain. (Patient not taking: Reported on 10/30/2019) 10 tablet 0  . meloxicam (MOBIC) 7.5 MG tablet Take 1 tablet (7.5 mg total) by mouth daily. (Patient not taking: Reported on 02/19/2020) 90 tablet 0   No facility-administered medications prior to visit.    No Known Allergies  ROS Review of Systems As per HPI   Objective:    Physical Exam Constitutional:      Appearance: He is well-developed.  HENT:     Head: Normocephalic and atraumatic.     Mouth/Throat:     Comments: Upper tooth on L tender to palpation, poor dentition Cardiovascular:     Rate and Rhythm: Normal rate and regular rhythm.     Heart sounds: Normal heart sounds.  Pulmonary:     Effort: Pulmonary effort is normal. No respiratory distress.     Breath sounds: Normal breath sounds.  Abdominal:     General: Bowel sounds are normal. There is no distension.     Palpations: Abdomen is soft.  Musculoskeletal:        General: No swelling.  Skin:    General: Skin is warm and dry.  Neurological:     Mental Status: He is alert.     Cranial Nerves: No cranial nerve deficit.     BP (!) 151/96   Ht 5\' 6"  (1.676 m)    Wt 209 lb (94.8 kg)   BMI 33.73 kg/m  Wt Readings from Last 3 Encounters:  02/19/20 209 lb (94.8 kg)  10/30/19 210 lb 12.8 oz (95.6 kg)  09/09/19 208 lb 1.6 oz (94.4 kg)     Health Maintenance Due  Topic Date Due  . Hepatitis C Screening  Never done  . PNEUMOCOCCAL POLYSACCHARIDE VACCINE AGE 71-64 HIGH RISK  Never done  . FOOT EXAM  Never done  . OPHTHALMOLOGY EXAM  Never done  . COVID-19 Vaccine (1) Never done  . HIV Screening  Never done  . TETANUS/TDAP  Never done  . INFLUENZA VACCINE  10/19/2019    There are no preventive care reminders to display for this patient.  Lab Results  Component Value Date   TSH 2.680 02/05/2020   Lab Results  Component Value Date   WBC 6.0 02/05/2020   HGB 14.8 02/05/2020   HCT 42.6 02/05/2020   MCV 92 02/05/2020   PLT 203 02/05/2020   Lab Results  Component Value Date   NA 143 02/05/2020   K 4.1 02/05/2020   CO2 23 02/05/2020   GLUCOSE 87 02/05/2020   BUN 16 02/05/2020   CREATININE 1.02 02/05/2020   BILITOT 0.3 02/05/2020   ALKPHOS 85 02/05/2020   AST 18 02/05/2020   ALT 16 02/05/2020   PROT 6.6 02/05/2020   ALBUMIN 4.4 02/05/2020  CALCIUM 9.5 02/05/2020   ANIONGAP 12 03/10/2017   Lab Results  Component Value Date   CHOL 212 (H) 10/23/2019   Lab Results  Component Value Date   HDL 42 10/23/2019   Lab Results  Component Value Date   LDLCALC 132 (H) 10/23/2019   Lab Results  Component Value Date   TRIG 210 (H) 10/23/2019   Lab Results  Component Value Date   CHOLHDL 5.0 10/23/2019   Lab Results  Component Value Date   HGBA1C 5.2 10/23/2019      Assessment & Plan:   Problem List Items Addressed This Visit      Cardiovascular and Mediastinum   Benign hypertension    Elevated BP today Add HCTZ 12.5.  Continue lisinopril 20 mg. Follow up in about 1 month      Relevant Medications   lisinopril (ZESTRIL) 20 MG tablet   sildenafil (VIAGRA) 100 MG tablet   simvastatin (ZOCOR) 20 MG tablet    hydrochlorothiazide (HYDRODIURIL) 25 MG tablet   Other Relevant Orders   Basic Metabolic Panel (BMET)     Other   Mixed hyperlipidemia    Elevated LDL at last check Follow repeat lipid panel and consider escalating statin   The 10-year ASCVD risk score Douglas Bussing DC Jr., Douglas al., Douglas Bird) is: 34.2%   Values used to calculate the score:     Age: 68 years     Sex: Male     Is Non-Hispanic African American: No     Diabetic: Yes     Tobacco smoker: Yes     Systolic Blood Pressure: 053 mmHg     Is BP treated: Yes     HDL Cholesterol: 42 mg/dL     Total Cholesterol: 212 mg/dL       Relevant Medications   lisinopril (ZESTRIL) 20 MG tablet   sildenafil (VIAGRA) 100 MG tablet   simvastatin (ZOCOR) 20 MG tablet   hydrochlorothiazide (HYDRODIURIL) 25 MG tablet   Toothache    He's got an appt with dentist on Dec 17th Suspect this is culprit of his headaches Will give course of augmentin       Headache    L posterior HA Suspect this is due to upper tooth as I was able to reproduce pain Augmentin ED precautions given if recurrent       Relevant Medications   ibuprofen (ADVIL) 800 MG tablet   Smoking    Encouraged cessation He's going to try patches Follow        Other Visit Diagnoses    Dyslipidemia    -  Primary   Relevant Medications   simvastatin (ZOCOR) 20 MG tablet   Other Relevant Orders   Lipid Profile   Chronic GERD       Relevant Medications   omeprazole (PRILOSEC) 20 MG capsule      Meds ordered this encounter  Medications  . levothyroxine (SYNTHROID) 150 MCG tablet    Sig: Take 1 tablet (150 mcg total) by mouth daily before breakfast.    Dispense:  90 tablet    Refill:  1  . lisinopril (ZESTRIL) 20 MG tablet    Sig: Take 1 tablet (20 mg total) by mouth daily.    Dispense:  90 tablet    Refill:  1  . omeprazole (PRILOSEC) 20 MG capsule    Sig: Take 1 capsule (20 mg total) by mouth daily.    Dispense:  90 capsule    Refill:  1  . sildenafil (VIAGRA)  100  MG tablet    Sig: Take 1 tablet (100 mg total) by mouth daily as needed for erectile dysfunction.    Dispense:  10 tablet    Refill:  1  . simvastatin (ZOCOR) 20 MG tablet    Sig: Take 1 tablet (20 mg total) by mouth daily.    Dispense:  90 tablet    Refill:  1  . hydrochlorothiazide (HYDRODIURIL) 25 MG tablet    Sig: Take 0.5 tablets (12.5 mg total) by mouth daily.    Dispense:  15 tablet    Refill:  1  . ibuprofen (ADVIL) 800 MG tablet    Sig: Take 1 tablet (800 mg total) by mouth every 8 (eight) hours as needed.    Dispense:  30 tablet    Refill:  0  . amoxicillin-clavulanate (AUGMENTIN) 875-125 MG tablet    Sig: Take 1 tablet by mouth 2 (two) times daily for 10 days.    Dispense:  20 tablet    Refill:  0    Follow-up: Return in about 4 weeks (around 03/18/2020).    Fayrene Helper, Douglas Bird

## 2020-02-20 DIAGNOSIS — Z87891 Personal history of nicotine dependence: Secondary | ICD-10-CM | POA: Insufficient documentation

## 2020-02-20 DIAGNOSIS — K0889 Other specified disorders of teeth and supporting structures: Secondary | ICD-10-CM | POA: Insufficient documentation

## 2020-02-20 DIAGNOSIS — R519 Headache, unspecified: Secondary | ICD-10-CM | POA: Insufficient documentation

## 2020-02-20 DIAGNOSIS — F17200 Nicotine dependence, unspecified, uncomplicated: Secondary | ICD-10-CM | POA: Insufficient documentation

## 2020-02-20 NOTE — Assessment & Plan Note (Signed)
Encouraged cessation He's going to try patches Follow

## 2020-02-20 NOTE — Assessment & Plan Note (Signed)
L posterior HA Suspect this is due to upper tooth as I was able to reproduce pain Augmentin ED precautions given if recurrent

## 2020-02-20 NOTE — Assessment & Plan Note (Signed)
He's got an appt with dentist on Dec 17th Suspect this is culprit of his headaches Will give course of augmentin

## 2020-02-20 NOTE — Assessment & Plan Note (Signed)
Elevated LDL at last check Follow repeat lipid panel and consider escalating statin   The 10-year ASCVD risk score Douglas Bird Douglas Brooke Bonito., et al., 2013) is: 34.2%   Values used to calculate the score:     Age: 54 years     Sex: Male     Is Non-Hispanic African American: No     Diabetic: Yes     Tobacco smoker: Yes     Systolic Blood Pressure: 809 mmHg     Is BP treated: Yes     HDL Cholesterol: 42 mg/dL     Total Cholesterol: 212 mg/dL

## 2020-02-20 NOTE — Assessment & Plan Note (Signed)
Elevated BP today Add HCTZ 12.5.  Continue lisinopril 20 mg. Follow up in about 1 month

## 2020-03-25 ENCOUNTER — Other Ambulatory Visit: Payer: Self-pay

## 2020-03-25 DIAGNOSIS — E782 Mixed hyperlipidemia: Secondary | ICD-10-CM

## 2020-03-26 LAB — BASIC METABOLIC PANEL
BUN/Creatinine Ratio: 17 (ref 9–20)
BUN: 17 mg/dL (ref 6–24)
CO2: 25 mmol/L (ref 20–29)
Calcium: 9.6 mg/dL (ref 8.7–10.2)
Chloride: 103 mmol/L (ref 96–106)
Creatinine, Ser: 0.98 mg/dL (ref 0.76–1.27)
GFR calc Af Amer: 100 mL/min/{1.73_m2} (ref >59)
GFR calc non Af Amer: 86 mL/min/{1.73_m2} (ref >59)
Glucose: 93 mg/dL (ref 65–99)
Potassium: 4.1 mmol/L (ref 3.5–5.2)
Sodium: 143 mmol/L (ref 134–144)

## 2020-03-26 LAB — LIPID PANEL WITH LDL/HDL RATIO
Cholesterol, Total: 209 mg/dL — ABNORMAL HIGH (ref 100–199)
HDL: 40 mg/dL (ref >39)
LDL Chol Calc (NIH): 142 mg/dL — ABNORMAL HIGH (ref 0–99)
LDL/HDL Ratio: 3.6 ratio (ref 0.0–3.6)
Triglycerides: 147 mg/dL (ref 0–149)
VLDL Cholesterol Cal: 27 mg/dL (ref 5–40)

## 2020-04-01 ENCOUNTER — Other Ambulatory Visit: Payer: Self-pay | Admitting: Gerontology

## 2020-04-01 ENCOUNTER — Other Ambulatory Visit: Payer: Self-pay

## 2020-04-01 ENCOUNTER — Ambulatory Visit: Payer: Self-pay | Admitting: Gerontology

## 2020-04-01 DIAGNOSIS — E039 Hypothyroidism, unspecified: Secondary | ICD-10-CM

## 2020-04-01 DIAGNOSIS — E782 Mixed hyperlipidemia: Secondary | ICD-10-CM

## 2020-04-01 DIAGNOSIS — I10 Essential (primary) hypertension: Secondary | ICD-10-CM

## 2020-04-01 MED ORDER — LEVOTHYROXINE SODIUM 150 MCG PO TABS: 150.0000 ug | ORAL_TABLET | Freq: Every day | ORAL | 1 refills | Status: DC

## 2020-04-01 MED ORDER — HYDROCHLOROTHIAZIDE 25 MG PO TABS: 12.5000 mg | ORAL_TABLET | Freq: Every day | ORAL | 1 refills | Status: DC

## 2020-04-01 NOTE — Patient Instructions (Signed)

## 2020-04-01 NOTE — Progress Notes (Signed)
[name]   Progress Note: General Provider: Wolfgang Phoenix, NP  SUBJECTIVE:   Douglas Bird is a 55 y.o. male who  has a past medical history of Diverticulitis (2011), GERD (gastroesophageal reflux disease), Hyperlipidemia, Hypertension, Hypothyroidism, and Thyroid disease. The patient presents today for lab review and medication refill. Labs done on  03/25/2020; total cholesterol 209 mg/dl, LDL 142 mg/dl. TSH done on 02/05/2020 was 2.680. He denies heart palpitations, irritability, fatigue, tremor, sweating, or hot/cold symptoms. He states that he checks his blood pressure at home but doesn't remember the readings " my blood pressure is not high" He brought home medication bottles except lisinopril. He takes only HCTZ 12.29m daily and didn't know he was supposed to pick up lisinopril 20 mg from the pharmacy. He reduced smoking as a step towards quitting; he decreased from a pack a day to one pack of cigarettes per week.  In addition, since the last visit, he has cut out red meat, eats more vegetables, and doing regular exercise. He states that he feels better now and is compliant with his medications. Overall, he states that he's doing well and offers no further complaints.  Review of Systems  Constitutional: Negative.   HENT: Negative.   Eyes: Negative.   Respiratory: Negative.   Cardiovascular: Negative.   Gastrointestinal: Negative.   Genitourinary: Negative.   Musculoskeletal: Negative.   Skin: Negative.   Neurological: Negative.   Psychiatric/Behavioral: Negative.      OBJECTIVE: 146/94 hr 84  Wt Readings from Last 3 Encounters:  02/19/20 209 lb (94.8 kg)  10/30/19 210 lb 12.8 oz (95.6 kg)  09/09/19 208 lb 1.6 oz (94.4 kg)     Physical Exam Constitutional:      Appearance: Normal appearance.  Cardiovascular:     Rate and Rhythm: Normal rate and regular rhythm.     Pulses: Normal pulses.     Heart sounds: Normal heart sounds.  Pulmonary:     Effort:  Pulmonary effort is normal.     Breath sounds: Normal breath sounds.  Abdominal:     General: Bowel sounds are normal.     Palpations: Abdomen is soft.  Musculoskeletal:        General: Normal range of motion.     Cervical back: Neck supple.  Neurological:     General: No focal deficit present.     Mental Status: He is oriented to person, place, and time.  Psychiatric:        Mood and Affect: Mood normal.        Behavior: Behavior normal.        Thought Content: Thought content normal.        Judgment: Judgment normal.     ASSESSMENT/PLAN:  1. Mixed hyperlipidemia Continue simvastatin 20 mg daily, will hold on increasing the dose at this time since you made significant healthy life style changes.  -Continiue DASH diet and daily exercise as tolerated. - Lipid Panel With LDL/HDL Ratio - Comp Met (CMET)  2. Hypothyroidism, unspecified type - No medication changes warranted at the present time - levothyroxine (SYNTHROID) 150 MCG tablet; Take 1 tablet (150 mcg total) by mouth daily before breakfast.  Dispense: 90 tablet; Refill: 1 TSH 3. Benign hypertension -Your blood pressure is not controlled, your target blood pressure should be less than 130/80 - Advised to continue taking blood pressure medications as directed; lisinopril 20 mg daily and hydrochlorothiazide 12.5 mg daily.   -Advised to check blood pressure twice a week  and to bring log with next appointment  The patient was given clear instructions to go to ER or return to medical center if symptoms do not improve, worsen or new problems develop. The patient verbalized understanding and agreed with plan of care.  Douglas Bird, [place]

## 2020-04-26 ENCOUNTER — Other Ambulatory Visit: Payer: Self-pay

## 2020-04-26 ENCOUNTER — Encounter: Payer: Self-pay | Admitting: Emergency Medicine

## 2020-04-26 ENCOUNTER — Emergency Department
Admission: EM | Admit: 2020-04-26 | Discharge: 2020-04-26 | Disposition: A | Discharge status: Home / Self Care | Payer: PRIVATE HEALTH INSURANCE | Attending: Emergency Medicine | Admitting: Emergency Medicine

## 2020-04-26 ENCOUNTER — Emergency Department: Payer: PRIVATE HEALTH INSURANCE

## 2020-04-26 DIAGNOSIS — Z79899 Other long term (current) drug therapy: Secondary | ICD-10-CM | POA: Diagnosis not present

## 2020-04-26 DIAGNOSIS — W312XXA Contact with powered woodworking and forming machines, initial encounter: Secondary | ICD-10-CM | POA: Diagnosis not present

## 2020-04-26 DIAGNOSIS — S66821A Laceration of other specified muscles, fascia and tendons at wrist and hand level, right hand, initial encounter: Secondary | ICD-10-CM

## 2020-04-26 DIAGNOSIS — F17210 Nicotine dependence, cigarettes, uncomplicated: Secondary | ICD-10-CM | POA: Diagnosis not present

## 2020-04-26 DIAGNOSIS — Y990 Civilian activity done for income or pay: Secondary | ICD-10-CM | POA: Insufficient documentation

## 2020-04-26 DIAGNOSIS — S66221A Laceration of extensor muscle, fascia and tendon of right thumb at wrist and hand level, initial encounter: Secondary | ICD-10-CM | POA: Insufficient documentation

## 2020-04-26 DIAGNOSIS — I10 Essential (primary) hypertension: Secondary | ICD-10-CM | POA: Insufficient documentation

## 2020-04-26 DIAGNOSIS — E039 Hypothyroidism, unspecified: Secondary | ICD-10-CM | POA: Diagnosis not present

## 2020-04-26 DIAGNOSIS — S61401A Unspecified open wound of right hand, initial encounter: Secondary | ICD-10-CM

## 2020-04-26 DIAGNOSIS — Z23 Encounter for immunization: Secondary | ICD-10-CM | POA: Insufficient documentation

## 2020-04-26 DIAGNOSIS — S6991XA Unspecified injury of right wrist, hand and finger(s), initial encounter: Secondary | ICD-10-CM | POA: Diagnosis present

## 2020-04-26 MED ORDER — OXYCODONE-ACETAMINOPHEN 5-325 MG PO TABS: 1.0000 | ORAL_TABLET | Freq: Four times a day (QID) | ORAL | 0 refills | Status: DC | PRN

## 2020-04-26 MED ORDER — FENTANYL CITRATE (PF) 100 MCG/2ML IJ SOLN
100.0000 ug | Freq: Once | INTRAMUSCULAR | Status: AC
  Administered 2020-04-26: 100 ug via INTRAVENOUS
  Filled 2020-04-26: qty 2

## 2020-04-26 MED ORDER — TETANUS-DIPHTH-ACELL PERTUSSIS 5-2.5-18.5 LF-MCG/0.5 IM SUSY
0.5000 mL | PREFILLED_SYRINGE | Freq: Once | INTRAMUSCULAR | Status: AC
  Administered 2020-04-26: 0.5 mL via INTRAMUSCULAR
  Filled 2020-04-26: qty 0.5

## 2020-04-26 MED ORDER — ONDANSETRON HCL 4 MG/2ML IJ SOLN
4.0000 mg | Freq: Once | INTRAMUSCULAR | Status: AC
  Administered 2020-04-26: 4 mg via INTRAVENOUS
  Filled 2020-04-26: qty 2

## 2020-04-26 MED ORDER — SULFAMETHOXAZOLE-TRIMETHOPRIM 800-160 MG PO TABS: 1.0000 | ORAL_TABLET | Freq: Two times a day (BID) | ORAL | 0 refills | Status: DC

## 2020-04-26 MED ORDER — LIDOCAINE-EPINEPHRINE (PF) 2 %-1:200000 IJ SOLN
20.0000 mL | Freq: Once | INTRAMUSCULAR | Status: AC
  Administered 2020-04-26: 20 mL
  Filled 2020-04-26: qty 20

## 2020-04-26 MED ORDER — OXYCODONE-ACETAMINOPHEN 5-325 MG PO TABS
1.0000 | ORAL_TABLET | Freq: Once | ORAL | Status: AC
  Administered 2020-04-26: 1 via ORAL
  Filled 2020-04-26: qty 1

## 2020-04-26 MED ORDER — CEPHALEXIN 500 MG PO CAPS: 500.0000 mg | ORAL_CAPSULE | Freq: Four times a day (QID) | ORAL | 0 refills | Status: DC

## 2020-04-26 NOTE — ED Notes (Signed)
Worker's Comp completed by this tech and turned in to [facility] at [employer] for [employer].

## 2020-04-26 NOTE — ED Notes (Signed)
Wound cleansed with saline, non adherent dressing placed and then short arm splint placed on top. Patient placed in sling.

## 2020-04-26 NOTE — ED Triage Notes (Signed)
Cut right hand with saw today at work.  Large laceration back of right hand.  Is right handed.  Sensation to all fingers.

## 2020-04-26 NOTE — ED Provider Notes (Signed)
[facility] Emergency Department Provider Note  ____________________________________________  Time seen: Approximately 5:23 PM  I have reviewed the triage vital signs and the nursing notes.   HISTORY  Chief Complaint Laceration    HPI Douglas Bird is a 55 y.o. male who presents the emergency department complaining of a hand laceration to the right hand.  Patient was using a miter saw at work when he accidentally cut his hand.  He states that the piece of wood he was cutting jerked, driving his hand to the blade.  This all has a working guard, but he states that the way that his hand entered the saw, and the blade would still make contact with hand.  This is along the dorsal aspect of the hand.  Initially it appeared that patient had full range of motion to his digits but had distinctly limited range of motion due to pain globally.  Patient had active pulsatile bleeding of the dorsal laceration.  He states that he has had a sweatshirt wrapped around his hands with  little control of the bleeding.  Patient denied any other injury or complaint.  He he states that his last tetanus shot was years ago.  Patient is not on blood thinners.  He does have a history of diverticulitis, GERD, hypertension, hypothyroidism.        Past Medical History:  Diagnosis Date  . Diverticulitis 2011  . GERD (gastroesophageal reflux disease)   . Hyperlipidemia   . Hypertension    controlled on meds  . Hypothyroidism   . Thyroid disease     Patient Active Problem List   Diagnosis Date Noted  . Toothache 02/20/2020  . Headache 02/20/2020  . Smoking 02/20/2020  . Umbilical hernia, incarcerated   . Back pain 03/27/2019  . Benign hypertension 05/02/2018  . Obesity (BMI 30.0-34.9) 04/30/2018  . Healthcare maintenance 10/19/2016  . Flu vaccine need 04/20/2016  . GERD (gastroesophageal reflux disease) 04/01/2015  . Gout 04/01/2015  . Mixed hyperlipidemia 10/01/2014  .  Hypothyroidism 05/21/2014    Past Surgical History:  Procedure Laterality Date  . CHOLECYSTECTOMY  June 2013  . COLONOSCOPY WITH PROPOFOL N/A 04/28/2019   Procedure: COLONOSCOPY WITH PROPOFOL;  Surgeon: Lin Landsman, MD;  Location: [place];  Service: Endoscopy;  Laterality: N/A;  priority 4  . HERNIA REPAIR Bilateral    inguinal  . UMBILICAL HERNIA REPAIR N/A 08/11/2019   Procedure: HERNIA REPAIR UMBILICAL ADULT;  Surgeon: Fredirick Maudlin, MD;  Location: [facility];  Service: General;  Laterality: N/A;  RNFA    Prior to Admission medications   Medication Sig Start Date End Date Taking? Authorizing Provider  cephALEXin (KEFLEX) 500 MG capsule Take 1 capsule (500 mg total) by mouth 4 (four) times daily. 04/26/20  Yes Barbarita Hutmacher, Charline Bills, PA-C  oxyCODONE-acetaminophen (PERCOCET/ROXICET) 5-325 MG tablet Take 1 tablet by mouth every 6 (six) hours as needed for severe pain. 04/26/20  Yes Michial Disney, Charline Bills, PA-C  sulfamethoxazole-trimethoprim (BACTRIM DS) 800-160 MG tablet Take 1 tablet by mouth 2 (two) times daily. 04/26/20  Yes Rosiland Sen, Charline Bills, PA-C  hydrochlorothiazide (HYDRODIURIL) 25 MG tablet Take 0.5 tablets (12.5 mg total) by mouth daily. 04/01/20 05/31/20  Abate, Desta A, NP  HYDROcodone-acetaminophen (NORCO/VICODIN) 5-325 MG tablet Take 1 tablet by mouth every 6 (six) hours as needed for moderate pain. Patient not taking: Reported on 10/30/2019 08/11/19   Fredirick Maudlin, MD  ibuprofen (ADVIL) 800 MG tablet Take 1 tablet (800 mg total) by mouth every 8 (  eight) hours as needed. 02/19/20   Elodia Florence., MD  levothyroxine (SYNTHROID) 150 MCG tablet Take 1 tablet (150 mcg total) by mouth daily before breakfast. 04/01/20 09/28/20  Abate, Desta A, NP  lisinopril (ZESTRIL) 20 MG tablet Take 1 tablet (20 mg total) by mouth daily. 02/19/20 05/19/20  Elodia Florence., MD  meloxicam (MOBIC) 7.5 MG tablet Take 1 tablet (7.5 mg total) by mouth daily. Patient not taking:  Reported on 02/19/2020 07/24/19   Zara Council A, PA-C  omeprazole (PRILOSEC) 20 MG capsule Take 1 capsule (20 mg total) by mouth daily. 02/19/20 08/17/20  Elodia Florence., MD  sildenafil (VIAGRA) 100 MG tablet Take 1 tablet (100 mg total) by mouth daily as needed for erectile dysfunction. 02/19/20   Elodia Florence., MD  simvastatin (ZOCOR) 20 MG tablet Take 1 tablet (20 mg total) by mouth daily. 02/19/20 08/17/20  Elodia Florence., MD    Allergies Patient has no known allergies.  Family History  Problem Relation Age of Onset  . Stroke Father   . Gout Father   . Aneurysm Father        2006  . Thyroid disease Father   . Epilepsy Father   . Thyroid disease Sister   . Diabetes Paternal Grandfather   . Cancer Mother     Social History Social History   Tobacco Use  . Smoking status: Current Every Day Smoker    Packs/day: 0.25    Years: 15.00    Pack years: 3.75    Types: Cigarettes  . Smokeless tobacco: Former Systems developer  . Tobacco comment: about a pack a week, has been cutting back  Vaping Use  . Vaping Use: Never used  Substance Use Topics  . Alcohol use: Yes    Alcohol/week: 12.0 standard drinks    Types: 12 Cans of beer per week    Comment: 12 a week "if that"  . Drug use: No     Review of Systems  Constitutional: No fever/chills Eyes: No visual changes. No discharge ENT: No upper respiratory complaints. Cardiovascular: no chest pain. Respiratory: no cough. No SOB. Gastrointestinal: No abdominal pain.  No nausea, no vomiting.  Musculoskeletal: Laceration to the right hand Skin: Negative for rash, abrasions, lacerations, ecchymosis. Neurological: Negative for headaches, focal weakness or numbness.  10 System ROS otherwise negative.  ____________________________________________   PHYSICAL EXAM:  VITAL SIGNS: ED Triage Vitals  Enc Vitals Group     BP 04/26/20 1458 (!) 130/103     Pulse Rate 04/26/20 1458 80     Resp 04/26/20 1458 18     Temp  04/26/20 1458 98 F (36.7 C)     Temp Source 04/26/20 1458 Oral     SpO2 04/26/20 1458 96 %     Weight 04/26/20 1441 210 lb (95.3 kg)     Height 04/26/20 1441 5\' 9"  (1.753 m)     Head Circumference --      Peak Flow --      Pain Score 04/26/20 1441 9     Pain Loc --      Pain Edu? --      Excl. in [place]? --      Constitutional: Alert and oriented. Well appearing and in no acute distress. Eyes: Conjunctivae are normal. PERRL. EOMI. Head: Atraumatic. ENT:      Ears:       Nose: No congestion/rhinnorhea.      Mouth/Throat: Mucous membranes are moist.  Neck: No stridor.    Cardiovascular: Normal rate, regular rhythm. Normal S1 and S2.  Good peripheral circulation. Respiratory: Normal respiratory effort without tachypnea or retractions. Lungs CTAB. Good air entry to the bases with no decreased or absent breath sounds. Musculoskeletal: Full range of motion to all extremities. No gross deformities appreciated.  Visualization of the right hand reveals extensive laceration originating over the proximal phalanx of the third digit extending into the interdigital space and into the dorsal hand.  Significant gaped open tissue, edges are macerated consistent with sawblade.  Patient has pulsatile bleeding currently.  Initial evaluation patient had movement in both the extensor and flexor distributions of all digits.  However after repair and pain was better managed, patient has good extension flexion of the middle, ring and pinky finger but there is no extension of the index finger.  Patient is able to flex it but not extend the digit.  Full range of motion to the thumb. Neurologic:  Normal speech and language. No gross focal neurologic deficits are appreciated.  Skin:  Skin is warm, dry and intact. No rash noted. Psychiatric: Mood and affect are normal. Speech and behavior are normal. Patient exhibits appropriate insight and judgement.        ____________________________________________    LABS (all labs ordered are listed, but only abnormal results are displayed)  Labs Reviewed - No data to display ____________________________________________  EKG   ____________________________________________  RADIOLOGY I personally viewed and evaluated these images as part of my medical decision making, as well as reviewing the written report by the radiologist.  ED Provider Interpretation: No injury to the osseous structures of the hand.  DG Hand Complete Right  Result Date: 04/26/2020 CLINICAL DATA:  55 year old male with saw injury to the middle finger. EXAM: RIGHT HAND - COMPLETE 3+ VIEW COMPARISON:  None. FINDINGS: There is no acute fracture or dislocation. The bones are well mineralized. No significant arthritic changes. Possible laceration of the skin of the dorsum of the proximal third digit. No radiopaque foreign object or soft tissue gas. IMPRESSION: No acute fracture or dislocation. Electronically Signed   By: Anner Crete M.D.   On: 04/26/2020 17:19    ____________________________________________    PROCEDURES  Procedure(s) performed:    Marland KitchenMarland KitchenLaceration Repair  Date/Time: 04/26/2020 5:30 PM Performed by: Darletta Moll, PA-C Authorized by: Darletta Moll, PA-C   Consent:    Consent obtained:  Verbal   Consent given by:  Patient   Risks, benefits, and alternatives were discussed: yes     Risks discussed:  Nerve damage, tendon damage, vascular damage, poor wound healing, poor cosmetic result, pain, infection and need for additional repair Universal protocol:    Procedure explained and questions answered to patient or proxy's satisfaction: yes     Patient identity confirmed:  Verbally with patient Anesthesia:    Anesthesia method:  Local infiltration   Local anesthetic:  Lidocaine 1% WITH epi Laceration details:    Location:  Hand   Hand location:  R hand, dorsum   Length (cm):  10 Pre-procedure details:    Preparation:  Patient was prepped  and draped in usual sterile fashion and imaging obtained to evaluate for foreign bodies Exploration:    Hemostasis achieved with:  Tied off vessels and epinephrine   Imaging obtained: bedside ultrasound     Imaging outcome: foreign body not noted     Wound exploration: entire depth of wound visualized     Wound extent: tendon damage  Wound extent: no foreign bodies/material noted, no nerve damage noted, no underlying fracture noted and no vascular damage noted     Tendon damage location:  Upper extremity   Upper extremity tendon damage location:  Finger extensor   Tendon damage extent:  Complete transection   Tendon repair plan:  Refer for evaluation   Contaminated: yes   Treatment:    Area cleansed with:  Shur-Clens and povidone-iodine   Amount of cleaning:  Extensive   Irrigation solution:  Sterile saline   Irrigation volume:  2 L   Irrigation method:  Syringe   Debridement:  Minimal   Layers/structures repaired:  Deep subcutaneous Deep subcutaneous:    Suture size:  5-0   Suture material:  Monocryl   Suture technique:  Simple interrupted and running   Number of sutures:  9 (8 deep simple interrupted sutures with 1 running interlock suture with approximately 20 throws) Skin repair:    Repair method:  Sutures   Suture size:  4-0   Suture material:  Nylon   Suture technique:  Running locked and simple interrupted   Number of sutures:  3 (1 running interlock suture with 18 throws, 3 simple interrupted sutures) Approximation:    Approximation:  Close Repair type:    Repair type:  Complex Post-procedure details:    Dressing:  Splint for protection   Procedure completion:  Tolerated well, no immediate complications        Medications  oxyCODONE-acetaminophen (PERCOCET/ROXICET) 5-325 MG per tablet 1 tablet (has no administration in time range)  lidocaine-EPINEPHrine (XYLOCAINE W/EPI) 2 %-1:200000 (PF) injection 20 mL (20 mLs Infiltration Given 04/26/20 1527)  ondansetron  (ZOFRAN) injection 4 mg (4 mg Intravenous Given 04/26/20 1522)  fentaNYL (SUBLIMAZE) injection 100 mcg (100 mcg Intravenous Given 04/26/20 1523)  Tdap (BOOSTRIX) injection 0.5 mL (0.5 mLs Intramuscular Given 04/26/20 1822)  fentaNYL (SUBLIMAZE) injection 100 mcg (100 mcg Intravenous Given 04/26/20 1827)     ____________________________________________   INITIAL IMPRESSION / ASSESSMENT AND PLAN / ED COURSE  Pertinent labs & imaging results that were available during my care of the patient were reviewed by me and considered in my medical decision making (see chart for details).  Review of the [place] CSRS was performed in accordance of the [place] prior to dispensing any controlled drugs.  Clinical Course as of 04/26/20 2006  Mon Apr 26, 2020  1831 Patient was work comp injury, patient received fentanyl prior to his urinary drug screen. [JC]    Clinical Course User Index [JC] Brandan Robicheaux, Charline Bills, PA-C          Patient's diagnosis is consistent with significant hand laceration with extensor tendon injury.  Patient presented to the emergency department after coming in contact with a miter saw at work.  Patient states that he was cutting a piece of wood, the wood jerked and brought his hand into contact with a blade.  Extensive laceration.  Originally there was hopes that patient was completely intact in regards to tendons as there was some movement of all 5 digits.  However after area was anesthetized, patient's pain was managed with fentanyl and I had repaired the laceration, patient is able to flex the index finger appropriately but is unable to extend that at this time.  At this time there is concern that patient has lacerated through the extensor tendon.  I attempted to contact [facility] for their hand surgeon for follow-up.  Unfortunately I was unable to reach them as they were not the primary  orthopedic service on call.  I discussed the patient with Dr. Roland Rack who was on-call for nonassociated  patient's.  After describing the injury, Dr. Roland Rack felt that they would likely be out of take care of this with the orthopedic group.  Patient will be seen tomorrow with potential surgery occurring Wednesday.  Strict return precautions discussed with the patient.  He will be placed on double coverage of antibiotics with Keflex and Bactrim.  Patient has given prescription for Percocet for pain.  Patient is given ED precautions to return to the ED for any worsening or new symptoms.     ____________________________________________  FINAL CLINICAL IMPRESSION(S) / ED DIAGNOSES  Final diagnoses:  Extensor tendon laceration of right hand with open wound, initial encounter      NEW MEDICATIONS STARTED DURING THIS VISIT:  ED Discharge Orders         Ordered    oxyCODONE-acetaminophen (PERCOCET/ROXICET) 5-325 MG tablet  Every 6 hours PRN        04/26/20 2000    cephALEXin (KEFLEX) 500 MG capsule  4 times daily        04/26/20 2000    sulfamethoxazole-trimethoprim (BACTRIM DS) 800-160 MG tablet  2 times daily        04/26/20 2000              This chart was dictated using voice recognition software/Dragon. Despite best efforts to proofread, errors can occur which can change the meaning. Any change was purely unintentional.    Brynda Peon 04/26/20 2008    Duffy Bruce, MD 04/28/20 [phone]

## 2020-04-27 ENCOUNTER — Other Ambulatory Visit
Admission: RE | Admit: 2020-04-27 | Discharge: 2020-04-27 | Disposition: A | Discharge status: Home / Self Care | Payer: HRSA Program | Source: Ambulatory Visit | Attending: Surgery | Admitting: Surgery

## 2020-04-27 ENCOUNTER — Other Ambulatory Visit: Payer: Self-pay | Admitting: Surgery

## 2020-04-27 DIAGNOSIS — Z20822 Contact with and (suspected) exposure to covid-19: Secondary | ICD-10-CM | POA: Insufficient documentation

## 2020-04-27 DIAGNOSIS — Z01812 Encounter for preprocedural laboratory examination: Secondary | ICD-10-CM | POA: Diagnosis present

## 2020-04-28 ENCOUNTER — Ambulatory Visit
Admission: RE | Admit: 2020-04-28 | Discharge: 2020-04-28 | Disposition: A | Discharge status: Home / Self Care | Payer: PRIVATE HEALTH INSURANCE | Attending: Surgery | Admitting: Surgery

## 2020-04-28 ENCOUNTER — Encounter
Admission: RE | Disposition: A | Discharge status: Home / Self Care | Payer: Self-pay | Source: Home / Self Care | Attending: Surgery

## 2020-04-28 ENCOUNTER — Ambulatory Visit: Payer: PRIVATE HEALTH INSURANCE | Admitting: Anesthesiology

## 2020-04-28 ENCOUNTER — Other Ambulatory Visit: Payer: Self-pay

## 2020-04-28 ENCOUNTER — Encounter: Payer: Self-pay | Admitting: Surgery

## 2020-04-28 DIAGNOSIS — Z823 Family history of stroke: Secondary | ICD-10-CM | POA: Insufficient documentation

## 2020-04-28 DIAGNOSIS — W270XXA Contact with workbench tool, initial encounter: Secondary | ICD-10-CM | POA: Insufficient documentation

## 2020-04-28 DIAGNOSIS — Z8349 Family history of other endocrine, nutritional and metabolic diseases: Secondary | ICD-10-CM | POA: Diagnosis not present

## 2020-04-28 DIAGNOSIS — Z79899 Other long term (current) drug therapy: Secondary | ICD-10-CM | POA: Diagnosis not present

## 2020-04-28 DIAGNOSIS — Y990 Civilian activity done for income or pay: Secondary | ICD-10-CM | POA: Diagnosis not present

## 2020-04-28 DIAGNOSIS — S66320A Laceration of extensor muscle, fascia and tendon of right index finger at wrist and hand level, initial encounter: Secondary | ICD-10-CM | POA: Insufficient documentation

## 2020-04-28 DIAGNOSIS — Y9389 Activity, other specified: Secondary | ICD-10-CM | POA: Insufficient documentation

## 2020-04-28 DIAGNOSIS — I10 Essential (primary) hypertension: Secondary | ICD-10-CM | POA: Diagnosis not present

## 2020-04-28 DIAGNOSIS — F17210 Nicotine dependence, cigarettes, uncomplicated: Secondary | ICD-10-CM | POA: Insufficient documentation

## 2020-04-28 DIAGNOSIS — Z8249 Family history of ischemic heart disease and other diseases of the circulatory system: Secondary | ICD-10-CM | POA: Insufficient documentation

## 2020-04-28 DIAGNOSIS — S61411A Laceration without foreign body of right hand, initial encounter: Secondary | ICD-10-CM | POA: Diagnosis present

## 2020-04-28 DIAGNOSIS — Z791 Long term (current) use of non-steroidal anti-inflammatories (NSAID): Secondary | ICD-10-CM | POA: Insufficient documentation

## 2020-04-28 HISTORY — PX: REPAIR EXTENSOR TENDON: SHX5382

## 2020-04-28 LAB — SARS CORONAVIRUS 2 (TAT 6-24 HRS): SARS Coronavirus 2: NEGATIVE

## 2020-04-28 SURGERY — REPAIR, TENDON, EXTENSOR
Anesthesia: General | Site: Hand | Laterality: Right

## 2020-04-28 MED ORDER — EPHEDRINE SULFATE 50 MG/ML IJ SOLN
INTRAMUSCULAR | Status: DC | PRN
  Administered 2020-04-28: 5 mg via INTRAVENOUS

## 2020-04-28 MED ORDER — OXYCODONE HCL 5 MG PO TABS
5.0000 mg | ORAL_TABLET | ORAL | Status: DC | PRN
  Administered 2020-04-28: 5 mg via ORAL

## 2020-04-28 MED ORDER — LACTATED RINGERS IV SOLN: INTRAVENOUS | Status: DC

## 2020-04-28 MED ORDER — LIDOCAINE HCL (CARDIAC) PF 100 MG/5ML IV SOSY
PREFILLED_SYRINGE | INTRAVENOUS | Status: DC | PRN
  Administered 2020-04-28: 60 mg via INTRAVENOUS

## 2020-04-28 MED ORDER — FENTANYL CITRATE (PF) 100 MCG/2ML IJ SOLN
INTRAMUSCULAR | Status: DC | PRN
  Administered 2020-04-28 (×3): 50 ug via INTRAVENOUS

## 2020-04-28 MED ORDER — MIDAZOLAM HCL 2 MG/2ML IJ SOLN
INTRAMUSCULAR | Status: DC | PRN
  Administered 2020-04-28: 2 mg via INTRAVENOUS

## 2020-04-28 MED ORDER — PROPOFOL 10 MG/ML IV BOLUS
INTRAVENOUS | Status: DC | PRN
  Administered 2020-04-28: 200 mg via INTRAVENOUS

## 2020-04-28 MED ORDER — FAMOTIDINE 20 MG PO TABS
ORAL_TABLET | ORAL | Status: AC
  Administered 2020-04-28: 20 mg via ORAL
  Filled 2020-04-28: qty 1

## 2020-04-28 MED ORDER — BUPIVACAINE HCL (PF) 0.5 % IJ SOLN
INTRAMUSCULAR | Status: DC | PRN
  Administered 2020-04-28: 15 mL

## 2020-04-28 MED ORDER — FENTANYL CITRATE (PF) 100 MCG/2ML IJ SOLN
INTRAMUSCULAR | Status: AC
  Filled 2020-04-28: qty 2

## 2020-04-28 MED ORDER — CHLORHEXIDINE GLUCONATE 0.12 % MT SOLN
15.0000 mL | Freq: Once | OROMUCOSAL | Status: AC
  Administered 2020-04-28: 15 mL via OROMUCOSAL

## 2020-04-28 MED ORDER — BUPIVACAINE HCL (PF) 0.5 % IJ SOLN
INTRAMUSCULAR | Status: AC
  Filled 2020-04-28: qty 60

## 2020-04-28 MED ORDER — FAMOTIDINE 20 MG PO TABS: 20.0000 mg | ORAL_TABLET | Freq: Once | ORAL | Status: AC

## 2020-04-28 MED ORDER — DEXMEDETOMIDINE HCL 200 MCG/2ML IV SOLN
INTRAVENOUS | Status: DC | PRN
  Administered 2020-04-28: 4 ug via INTRAVENOUS

## 2020-04-28 MED ORDER — METOCLOPRAMIDE HCL 5 MG/ML IJ SOLN: 5.0000 mg | Freq: Three times a day (TID) | INTRAMUSCULAR | Status: DC | PRN

## 2020-04-28 MED ORDER — ONDANSETRON HCL 4 MG PO TABS: 4.0000 mg | ORAL_TABLET | Freq: Four times a day (QID) | ORAL | Status: DC | PRN

## 2020-04-28 MED ORDER — POTASSIUM CHLORIDE IN NACL 20-0.9 MEQ/L-% IV SOLN
INTRAVENOUS | Status: DC
  Filled 2020-04-28 (×3): qty 1000

## 2020-04-28 MED ORDER — ACETAMINOPHEN 10 MG/ML IV SOLN
INTRAVENOUS | Status: DC | PRN
  Administered 2020-04-28: 1000 mg via INTRAVENOUS

## 2020-04-28 MED ORDER — DEXMEDETOMIDINE (PRECEDEX) IN NS 20 MCG/5ML (4 MCG/ML) IV SYRINGE
PREFILLED_SYRINGE | INTRAVENOUS | Status: AC
  Filled 2020-04-28: qty 5

## 2020-04-28 MED ORDER — CEFAZOLIN SODIUM-DEXTROSE 2-4 GM/100ML-% IV SOLN
2.0000 g | INTRAVENOUS | Status: AC
  Administered 2020-04-28: 2 g via INTRAVENOUS

## 2020-04-28 MED ORDER — ACETAMINOPHEN 10 MG/ML IV SOLN
INTRAVENOUS | Status: AC
  Filled 2020-04-28: qty 100

## 2020-04-28 MED ORDER — ONDANSETRON HCL 4 MG/2ML IJ SOLN
INTRAMUSCULAR | Status: DC | PRN
  Administered 2020-04-28: 4 mg via INTRAVENOUS

## 2020-04-28 MED ORDER — OXYCODONE HCL 5 MG PO TABS
ORAL_TABLET | ORAL | Status: AC
  Filled 2020-04-28: qty 1

## 2020-04-28 MED ORDER — ONDANSETRON HCL 4 MG/2ML IJ SOLN: 4.0000 mg | Freq: Four times a day (QID) | INTRAMUSCULAR | Status: DC | PRN

## 2020-04-28 MED ORDER — FENTANYL CITRATE (PF) 250 MCG/5ML IJ SOLN
INTRAMUSCULAR | Status: AC
  Filled 2020-04-28: qty 5

## 2020-04-28 MED ORDER — DEXAMETHASONE SODIUM PHOSPHATE 10 MG/ML IJ SOLN
INTRAMUSCULAR | Status: DC | PRN
  Administered 2020-04-28: 5 mg via INTRAVENOUS

## 2020-04-28 MED ORDER — MIDAZOLAM HCL 2 MG/2ML IJ SOLN
INTRAMUSCULAR | Status: AC
  Filled 2020-04-28: qty 2

## 2020-04-28 MED ORDER — PROPOFOL 10 MG/ML IV BOLUS
INTRAVENOUS | Status: AC
  Filled 2020-04-28: qty 40

## 2020-04-28 MED ORDER — METOCLOPRAMIDE HCL 10 MG PO TABS: 5.0000 mg | ORAL_TABLET | Freq: Three times a day (TID) | ORAL | Status: DC | PRN

## 2020-04-28 MED ORDER — ORAL CARE MOUTH RINSE: 15.0000 mL | Freq: Once | OROMUCOSAL | Status: AC

## 2020-04-28 SURGICAL SUPPLY — 35 items
APL PRP STRL LF DISP 70% ISPRP (MISCELLANEOUS) ×1
BNDG CMPR STD VLCR NS LF 5.8X4 (GAUZE/BANDAGES/DRESSINGS) ×1
BNDG ELASTIC 4X5.8 VLCR NS LF (GAUZE/BANDAGES/DRESSINGS) ×2 IMPLANT
BNDG ESMARK 4X12 TAN STRL LF (GAUZE/BANDAGES/DRESSINGS) ×2 IMPLANT
CHLORAPREP W/TINT 26 (MISCELLANEOUS) ×2 IMPLANT
CORD BIP STRL DISP 12FT (MISCELLANEOUS) ×2 IMPLANT
COVER WAND RF STERILE (DRAPES) ×2 IMPLANT
ELECT CAUTERY BLADE 6.4 (BLADE) ×2 IMPLANT
ELECT REM PT RETURN 9FT ADLT (ELECTROSURGICAL) ×2
ELECTRODE REM PT RTRN 9FT ADLT (ELECTROSURGICAL) ×1 IMPLANT
FORCEPS JEWEL BIP 4-3/4 STR (INSTRUMENTS) ×2 IMPLANT
GAUZE SPONGE 4X4 12PLY STRL (GAUZE/BANDAGES/DRESSINGS) ×2 IMPLANT
GAUZE XEROFORM 1X8 LF (GAUZE/BANDAGES/DRESSINGS) ×2 IMPLANT
GLOVE BIO SURGEON STRL SZ8 (GLOVE) ×4 IMPLANT
GLOVE INDICATOR 8.0 STRL GRN (GLOVE) ×2 IMPLANT
GOWN STRL REUS W/ TWL LRG LVL3 (GOWN DISPOSABLE) ×1 IMPLANT
GOWN STRL REUS W/ TWL XL LVL3 (GOWN DISPOSABLE) ×1 IMPLANT
GOWN STRL REUS W/TWL LRG LVL3 (GOWN DISPOSABLE) ×2
GOWN STRL REUS W/TWL XL LVL3 (GOWN DISPOSABLE) ×2
KIT TURNOVER KIT A (KITS) ×2 IMPLANT
MANIFOLD NEPTUNE II (INSTRUMENTS) ×2 IMPLANT
NEEDLE HYPO 25X1 1.5 SAFETY (NEEDLE) ×4 IMPLANT
NS IRRIG 500ML POUR BTL (IV SOLUTION) ×2 IMPLANT
PACK EXTREMITY ARMC (MISCELLANEOUS) ×2 IMPLANT
PAD CAST CTTN 4X4 STRL (SOFTGOODS) ×1 IMPLANT
PADDING CAST COTTON 4X4 STRL (SOFTGOODS) ×2
SPLINT CAST 1 STEP 4X15 (MISCELLANEOUS) ×2 IMPLANT
STOCKINETTE STRL 4IN 9604848 (GAUZE/BANDAGES/DRESSINGS) ×2 IMPLANT
SUT ETHILON 4 0 P 3 18 (SUTURE) ×2 IMPLANT
SUT ETHILON 5 0 P 3 18 (SUTURE) ×1
SUT MERSILENE 4-0 WHT RB-1 (SUTURE) ×14 IMPLANT
SUT NYLON ETHILON 5-0 P-3 1X18 (SUTURE) ×1 IMPLANT
SUT PROLENE 4 0 PS 2 18 (SUTURE) ×6 IMPLANT
SUT PROLENE 6 0 PC 1 (SUTURE) ×4 IMPLANT
SYR 10ML LL (SYRINGE) ×2 IMPLANT

## 2020-04-28 NOTE — Transfer of Care (Signed)
Immediate Anesthesia Transfer of Care Note  Patient: Douglas Bird  Procedure(s) Performed: EXPLORATION OF RIGHT DORSAL HAND LACERATION WITH REPAIR OF INDEX EXTENSOR TENDON LACERATION (Right Hand)  Patient Location: PACU  Anesthesia Type:General  Level of Consciousness: awake, alert  and oriented  Airway & Oxygen Therapy: Patient Spontanous Breathing  Post-op Assessment: Report given to RN  Post vital signs: Reviewed and stable  Last Vitals:  Vitals Value Taken Time  BP 129/86 04/28/20 1434  Temp    Pulse 80 04/28/20 1438  Resp 21 04/28/20 1438  SpO2 95 % 04/28/20 1438  Vitals shown include unvalidated device data.  Last Pain:  Vitals:   04/28/20 1150  TempSrc: Temporal  PainSc: 0-No pain         Complications: No complications documented.

## 2020-04-28 NOTE — Op Note (Signed)
04/28/2020  2:40 PM  Patient:   Douglas Bird  Pre-Op Diagnosis:   Right dorsal hand laceration with laceration of extensor tendon to right index finger.  Post-Op Diagnosis:   Right dorsal hand laceration with lacerations of extensor indicis proprius and index extensor digitorum communis tendons.  Procedure:   1.  Primary repair of right EIP and index EDC tendons. 2.  Irrigation and debridement with primary repair of right dorsal hand laceration (approximately 10 cm in length).  Surgeon:   Pascal Lux, MD  Assistant:   None  Anesthesia:   General LMA  Findings:   As above.  Complications:   None  Fluids:   1000 cc crystalloid  EBL:   10 cc  UOP:   None  TT:   105 minutes at 250 mmHg  Closure:   4-0 Prolene interrupted sutures  Brief Clinical Note:   The patient is a 55 year old male who sustained the above-noted injury at work 2 days ago.  Apparently, while using a miter saw, the wood slipped, drawing his hand under the blade.  He presented to the emergency room where the wound was explored and closed by the ER provider.  However, the patient was noted to be unable to extend the index finger and was referred to orthopedics for further evaluation and treatment.  The patient presents at this time for definitive management of this injury.  Procedure:   The patient was brought into the operating room and lain in the supine position.  After adequate general laryngeal mask anesthesia was obtained, the patient's right hand and upper extremity were prepped with Betadine prep and Betadine scrub solution before being draped sterilely.  Preoperative antibiotics were administered.  A timeout was performed to verify the appropriate surgical site before the limb was exsanguinated with an Esmarch and the tourniquet inflated to 250 mmHg.  The recently placed sutures were removed and the wound reopened.  The margins of the wound were debrided sharply with iris scissors and a #15 blade.  The  extensor tendons to the index and long fingers were carefully explored.  The long finger extensor tendon was in excellent condition, but both the extensor indices proprius and index extensor digitorum communis tendons had been lacerated just proximal to the MCP joint each of the tendon stumps was retrieved and freshened sharply.  Each tendon was repaired using 4-0 Mersilene sutures in a modified Kessler technique.  Separate sutures were placed through the proximal and distal stumps of each tendon before the appropriate ends of each suture were brought together and tied securely.  After these tendons were repaired, the dorsal surface of the repaired tendons was further reinforced using a 6-0 Prolene running suture.  The repair was conducted with the index MCP joint at approximately 30 degrees of flexion.  The wound was copiously irrigated with sterile saline solution before it was closed using several 3-0 Vicryl interrupted sutures for the subcutaneous tissues to alleviate stress on the skin.  The skin itself was closed using 4-0 Prolene interrupted sutures.  A total of 15 cc of 0.5% plain Sensorcaine was injected in and around the incision to help with postoperative analgesia before a sterile bulky dressing was applied to the hand.  The patient was then placed into a volar fiberglass splint maintaining the wrist in slight extension and the MCP joints of the index and long fingers at approximately 25 to 30 degrees of flexion.  The ring and little fingers were flexed closer to 45 to 50  degrees.  The patient was then awakened, extubated, and returned to the recovery room in satisfactory condition after tolerating the procedure well.

## 2020-04-28 NOTE — Anesthesia Preprocedure Evaluation (Signed)
Anesthesia Evaluation  Patient identified by MRN, date of birth, ID band Patient awake    Reviewed: Allergy & Precautions, NPO status , Patient's Chart, lab work & pertinent test results  History of Anesthesia Complications Negative for: history of anesthetic complications  Airway Mallampati: III  TM Distance: >3 FB Neck ROM: Full    Dental no notable dental hx. (+) Teeth Intact, Poor Dentition   Pulmonary neg sleep apnea, neg COPD, Current SmokerPatient did not abstain from smoking.,    Pulmonary exam normal breath sounds clear to auscultation       Cardiovascular Exercise Tolerance: Good METShypertension, (-) CAD and (-) Past MI (-) dysrhythmias  Rhythm:Regular Rate:Normal - Systolic murmurs    Neuro/Psych  Headaches, negative psych ROS   GI/Hepatic GERD  Medicated,(+)     (-) substance abuse  ,   Endo/Other  neg diabetesHypothyroidism   Renal/GU negative Renal ROS     Musculoskeletal   Abdominal   Peds  Hematology   Anesthesia Other Findings Past Medical History: 2011: Diverticulitis No date: GERD (gastroesophageal reflux disease) No date: Hyperlipidemia No date: Hypertension     Comment:  controlled on meds No date: Hypothyroidism No date: Thyroid disease  Reproductive/Obstetrics                             Anesthesia Physical Anesthesia Plan  ASA: II  Anesthesia Plan: General   Post-op Pain Management:    Induction: Intravenous  PONV Risk Score and Plan: 2 and Ondansetron, Dexamethasone and Midazolam  Airway Management Planned: LMA  Additional Equipment: None  Intra-op Plan:   Post-operative Plan: Extubation in OR  Informed Consent: I have reviewed the patients History and Physical, chart, labs and discussed the procedure including the risks, benefits and alternatives for the proposed anesthesia with the patient or authorized representative who has indicated  his/her understanding and acceptance.     Dental advisory given  Plan Discussed with: CRNA and Surgeon  Anesthesia Plan Comments: (Discussed risks of anesthesia with patient, including PONV, sore throat, lip/dental damage. Rare risks discussed as well, such as cardiorespiratory and neurological sequelae. Patient understands. Patient counseled on benefits of smoking cessation, and increased perioperative risks associated with continued smoking. )       Anesthesia Quick Evaluation

## 2020-04-28 NOTE — Discharge Instructions (Addendum)
AMBULATORY SURGERY  DISCHARGE INSTRUCTIONS   1) The drugs that you were given will stay in your system until tomorrow so for the next 24 hours you should not:  A) Drive an automobile B) Make any legal decisions C) Drink any alcoholic beverage   2) You may resume regular meals tomorrow.  Today it is better to start with liquids and gradually work up to solid foods.  You may eat anything you prefer, but it is better to start with liquids, then soup and crackers, and gradually work up to solid foods.   3) Please notify your doctor immediately if you have any unusual bleeding, trouble breathing, redness and pain at the surgery site, drainage, fever, or pain not relieved by medication. 4)   5) Your post-operative visit with Dr.                                     is: Date:                        Time:    Please call to schedule your post-operative visit.  6) Additional Instructions:      Orthopedic discharge instructions: Keep splint dry and intact. Keep hand elevated above heart level. Apply ice to affected area frequently. Take ibuprofen 600-800 mg TID with meals for 7-10 days, then as necessary. Take pain medication as prescribed or ES Tylenol when needed.  Return for follow-up in 10-14 days or as scheduled.

## 2020-04-28 NOTE — H&P (Signed)
History of Present Illness:  Douglas Bird is a 55 y.o. male who presents for evaluation and treatment of a right hand injury which occurred yesterday while at work. 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 He was using a miter saw cutting angles for some door trim when his saw caught and shifted something, causing the sawblade to run across the back of his right hand. He presented to the emergency room where the wound was irrigated thoroughly and closed. Examination at this visit raise concern for a laceration to the extensor tendon to his right index finger. Therefore, the patient was referred to orthopedics for further evaluation and treatment. The patient recalls having injured his right hand years ago as a kid when his hand got caught in the door. This resulted in him having a residual flexion contracture of the little PIP joint. However, he had no difficulty with any of the other 4 digits prior to his injury yesterday. The patient denies any numbness or paresthesias to any of his digits. He does note moderate pain to the dorsum of his hand for which he has been taking an occasional Percocet with relief.  Current Outpatient Medications: . cephalexin (KEFLEX) 500 MG capsule Take 500 mg by mouth 4 (four) times daily  . hydroCHLOROthiazide (HYDRODIURIL) 25 MG tablet Take 12.5 mg by mouth once daily  . ibuprofen (MOTRIN) 800 MG tablet Take 1 tablet by mouth every 8 (eight) hours as needed  . levothyroxine (SYNTHROID) 150 MCG tablet Take 1 tablet by mouth once daily  . lisinopriL (ZESTRIL) 20 MG tablet Take 20 mg by mouth once daily  . omeprazole (PRILOSEC) 20 MG DR capsule Take 1 capsule by mouth once daily  . oxyCODONE-acetaminophen (PERCOCET) 5-325 mg tablet Take 1 tablet by mouth every 6 (six) hours as needed  . sulfamethoxazole-trimethoprim (BACTRIM DS) 800-160 mg tablet Take 1 tablet by mouth 2 (two) times daily   Allergies: No Known Allergies  Past  Medical History:  . Diverticulitis 2011  . GERD (gastroesophageal reflux disease)  . Hyperlipemia  . Hypertension  . Hypothyroidism  . Thyroid disease   Past Surgical History:  . CHOLECYSTECTOMY 08/2011  . COLONOSCOPY 04/28/2019 (Vanga,Rohini)  . INGUINAL HERNIA REPAIR  . UMBILICAL HERNIA REPAIR 09/13/9483 (Cannon,Jennifer)   Family History:  . Stroke Father  . Gout Father  . Aneurysm Father   Social History:   Socioeconomic History:  Marland Kitchen Marital status: Single  Spouse name: Not on file  . Number of children: Not on file  . Years of education: Not on file  . Highest education level: Not on file  Occupational History  . Not on file  Tobacco Use  . Smoking status: Current Every Day Smoker  Years: 15.00  Types: Cigarettes  . Smokeless tobacco: Former Network engineer and Sexual Activity  . Alcohol use: Yes  Alcohol/week: 12.0 standard drinks  Types: 12 Cans of beer per week  . Drug use: Never  . Sexual activity: Not Currently  Other Topics Concern  . Not on file  Social History Narrative  . Not on file   Social Determinants of Health:   Financial Resource Strain: Not on file  Food Insecurity: Not on file  Transportation Needs: Not on file   Review of Systems:  A comprehensive 14 point ROS was performed, reviewed, and the pertinent orthopaedic findings are documented in the HPI.  Physical Exam: Vitals:  04/27/20 1235  BP: 131/86  Weight: 95.3 kg (210  lb)  Height: 175.3 cm (5\' 9" )  PainSc: 8  PainLoc: Hand   General/Constitutional: The patient appears to be well-nourished, well-developed, and in no acute distress. Neuro/Psych: Normal mood and affect, oriented to person, place and time. Eyes: Non-icteric. Pupils are equal, round, and reactive to light, and exhibit synchronous movement. ENT: Unremarkable. Lymphatic: No palpable adenopathy. Respiratory: Lungs clear to auscultation, Normal chest excursion, No wheezes and Non-labored breathing Cardiovascular:  Regular rate and rhythm. No murmurs. and No edema, swelling or tenderness, except as noted in detailed exam. Integumentary: No impressive skin lesions present, except as noted in detailed exam. Musculoskeletal: Unremarkable, except as noted in detailed exam.  Right hand exam: Skin inspection of the right hand is notable for a long laceration extending from the PIP extension crease of the long finger proximally to the area overlying the proximal end of the index metacarpal. He is able to actively flex all digits, but is unable to extend the index finger actively. He is able to actively extend the long, ring, and little fingers without difficulty. He is neurovascularly intact to all digits.  Assessment: Extensor tendon laceration of right hand with open wound.   Plan: The treatment options were discussed with the patient. In addition, patient educational materials were provided regarding the diagnosis and treatment options. Given the apparent laceration of the index extensor tendon and that he is right-handed, I feel that he would be best served by exploration of the wound with surgical repair of the index extensor tendon. The procedure was discussed with the patient, as were the potential risks (including bleeding, infection, nerve and/or blood vessel injury, persistent or recurrent pain, weakness and/or stiffness of the finger, failure of the repair, need for further surgery, blood clots, strokes, heart attacks and/or arhythmias, pneumonia, etc.) and benefits. The patient states his/her understanding and wishes to proceed. All of the patient's questions and concerns were answered. He can call any time with further concerns. He will follow up post-surgery, routine.   H&P reviewed and patient re-examined. No changes.

## 2020-04-28 NOTE — Anesthesia Procedure Notes (Signed)
Procedure Name: LMA Insertion Date/Time: 04/28/2020 12:20 PM Performed by: Louann Sjogren, CRNA Pre-anesthesia Checklist: Patient identified, Patient being monitored, Timeout performed, Emergency Drugs available and Suction available Patient Re-evaluated:Patient Re-evaluated prior to induction Oxygen Delivery Method: Circle system utilized Preoxygenation: Pre-oxygenation with 100% oxygen Induction Type: IV induction Ventilation: Mask ventilation without difficulty LMA: LMA inserted LMA Size: 4.0 Tube type: Oral Number of attempts: 1 Placement Confirmation: positive ETCO2 and breath sounds checked- equal and bilateral Tube secured with: Tape Dental Injury: Teeth and Oropharynx as per pre-operative assessment

## 2020-04-28 NOTE — Anesthesia Postprocedure Evaluation (Signed)
Anesthesia Post Note  Patient: Douglas Bird  Procedure(s) Performed: EXPLORATION OF RIGHT DORSAL HAND LACERATION WITH REPAIR OF INDEX EXTENSOR TENDON LACERATION (Right Hand)  Patient location during evaluation: PACU Anesthesia Type: General Level of consciousness: awake and alert Pain management: pain level controlled Vital Signs Assessment: post-procedure vital signs reviewed and stable Respiratory status: spontaneous breathing, nonlabored ventilation, respiratory function stable and patient connected to nasal cannula oxygen Cardiovascular status: blood pressure returned to baseline and stable Postop Assessment: no apparent nausea or vomiting Anesthetic complications: no   No complications documented.   Last Vitals:  Vitals:   04/28/20 1531 04/28/20 1537  BP: (!) 121/94 (!) 131/95  Pulse: 70 71  Resp:  12  Temp: (!) 36.1 C   SpO2: 97% 97%    Last Pain:  Vitals:   04/28/20 1537  TempSrc:   PainSc: 5                  Arita Miss

## 2020-04-29 ENCOUNTER — Encounter: Payer: Self-pay | Admitting: Surgery

## 2020-05-31 ENCOUNTER — Other Ambulatory Visit: Payer: Self-pay | Admitting: Gerontology

## 2020-06-04 ENCOUNTER — Ambulatory Visit: Payer: PRIVATE HEALTH INSURANCE | Attending: Student | Admitting: Occupational Therapy

## 2020-06-04 ENCOUNTER — Other Ambulatory Visit: Payer: Self-pay

## 2020-06-04 ENCOUNTER — Encounter: Payer: Self-pay | Admitting: Occupational Therapy

## 2020-06-04 DIAGNOSIS — M25631 Stiffness of right wrist, not elsewhere classified: Secondary | ICD-10-CM | POA: Insufficient documentation

## 2020-06-04 DIAGNOSIS — M25641 Stiffness of right hand, not elsewhere classified: Secondary | ICD-10-CM | POA: Diagnosis present

## 2020-06-04 DIAGNOSIS — M79641 Pain in right hand: Secondary | ICD-10-CM | POA: Insufficient documentation

## 2020-06-04 DIAGNOSIS — M6281 Muscle weakness (generalized): Secondary | ICD-10-CM | POA: Insufficient documentation

## 2020-06-04 DIAGNOSIS — L905 Scar conditions and fibrosis of skin: Secondary | ICD-10-CM | POA: Diagnosis not present

## 2020-06-04 NOTE — Therapy (Signed)
[facility] [address], [place], [address] Phone: [phone]   Fax:  [phone]  Occupational Therapy Evaluation  Patient Details  Name: Douglas Bird MRN: [id number] Date of Birth: 1965/09/30 Referring Provider (OT): Dr Roland Rack   Encounter Date: 06/04/2020   OT End of Session - 06/04/20 1604    Visit Number 1    Number of Visits 12    Date for OT Re-Evaluation 07/16/20    OT Start Time 0748    OT Stop Time 0841    OT Time Calculation (min) 53 min    Activity Tolerance Patient tolerated treatment well    Behavior During Therapy [facility] for tasks assessed/performed           Past Medical History:  Diagnosis Date  . Diverticulitis 2011  . GERD (gastroesophageal reflux disease)   . Hyperlipidemia   . Hypertension    controlled on meds  . Hypothyroidism   . Thyroid disease     Past Surgical History:  Procedure Laterality Date  . CHOLECYSTECTOMY  June 2013  . COLONOSCOPY WITH PROPOFOL N/A 04/28/2019   Procedure: COLONOSCOPY WITH PROPOFOL;  Surgeon: Lin Landsman, MD;  Location: [facility];  Service: Endoscopy;  Laterality: N/A;  priority 4  . HERNIA REPAIR Bilateral    inguinal  . REPAIR EXTENSOR TENDON Right 04/28/2020   Procedure: EXPLORATION OF RIGHT DORSAL HAND LACERATION WITH REPAIR OF INDEX EXTENSOR TENDON LACERATION;  Surgeon: Corky Mull, MD;  Location: [facility];  Service: Orthopedics;  Laterality: Right;  . UMBILICAL HERNIA REPAIR N/A 08/11/2019   Procedure: HERNIA REPAIR UMBILICAL ADULT;  Surgeon: Fredirick Maudlin, MD;  Location: [facility];  Service: General;  Laterality: N/A;  RNFA    There were no vitals filed for this visit.   Subjective Assessment - 06/04/20 1501    Subjective  I have been taking off my splint to bath and when I sit and watch tv - massage - it looks so much better than it use to - pain still 4/10 now sitting her and increase to 7/10 when trying to move     Pertinent History Pt had saw accident on 04/26/20 at [facility] and then Surgery by Dr Roland Rack on 04/28/20 - Primary repair of right EIP and index EDC tendons.2. Irrigation and debridement with primary repair of right dorsal hand laceration (approximately 10 cm in length). Sutures were taken out on 05/10/20 and kept on dorsal splint - and then follow up again at [facility] 05/24/20 - kept in dorsal forearm splint and refer to OT/hand therapy - but with workers comp -referral to OT did not happend until 06/02/20 - pt now about 5 1/2 wks s/p    Patient Stated Goals Want to get the use of my R hand back to be able to work and take care of the animals and yard /garden at home    Currently in Pain? Yes    Pain Score 4    7/10 with AROM   Pain Location Hand    Pain Orientation Right    Pain Descriptors / Indicators Aching;Tightness;Burning;Sore    Pain Type Surgical pain;Acute pain    Pain Onset More than a month ago    Pain Frequency Constant             [facility] OT Assessment - 06/04/20 0001      Assessment   Medical Diagnosis Repair of EIP and EDC of R 2nd digit, debridement and  irrigation    Referring Provider (OT) Dr Roland Rack    Onset Date/Surgical Date 04/28/20    Hand Dominance Right    Next MD Visit --   28th April     Precautions   Precaution Comments --   extensor protocol zone 5     Home  Environment   Lives With --   Fiance     Prior Function   Vocation Full time employment    Leisure Architect, farm -animals, yard , garden , fishing      AROM   Right Wrist Extension 40 Degrees    Right Wrist Flexion 80 Degrees    Right Wrist Radial Deviation 22 Degrees    Right Wrist Ulnar Deviation 30 Degrees      Right Hand AROM   R Index  MCP 0-90 40 Degrees   -20   R Index PIP 0-100 50 Degrees   -20   R Long  MCP 0-90 35 Degrees   -20   R Long PIP 0-100 65 Degrees   -10   R Ring  MCP 0-90 50 Degrees    R Ring PIP 0-100 85 Degrees    R Little  MCP 0-90 60 Degrees    R Little PIP 0-100 70 Degrees    flexor contracture prior to injury              Contrast done with pt prior to review of HEP -to decrease pain and edema - increase AROM  MC block splint fabricated - and pt to use 30 min to hour after HEP - every 2 hrs - for increase hook fist - but assist full extention if notice lag increase  No gripping or use of hand -and inbetween and night time in dorsal block splint   Tendon glides -AROM - block - MC flexion , hook fist - facilitate place and hold for extention  Composite fist around cylinder - large - - place and hold  extention -  10 reps -  Opposition - pick up 2 cm foam block - alternate digits- 5 reps each  Wrist AROM flexion and extention - open hand and loose fist  10reps  Every 2 hrs -  To do contrast - then HEP pain under 2/10 - back of if slight pull -and effort for AROM about 50%             OT Education - 06/04/20 1604    Education Details Findings of eval and HEP    Person(s) Educated Patient    Methods Explanation;Demonstration;Tactile cues;Verbal cues;Handout    Comprehension Verbal cues required;Returned demonstration;Verbalized understanding            OT Short Term Goals - 06/04/20 1656      OT SHORT TERM GOAL #1   Title Pt to be independent in HEP and desentitization to tolerate scar massage and different textures    Baseline 4/10 at rest of scar - increase to 7/10  - very tender for scar massage and sensitive to soft and rougher textures - scar adhere - no hyper extention of MC 2nd and 3rd    Time 3    Period Weeks    Status New    Target Date 06/25/20      OT SHORT TERM GOAL #2   Title Pt to be indepenent with use of  MC block plint and dynamic splints to increase flexion in digits while maintaining extention less than -15 degree lag    Baseline  MC's -20 2nd and 3rd - -20 2nd PIP , -10 3rd PIP - flexion MC's 35-60, PIP's 50-85 degrees    Time 3    Period Weeks    Status New    Target Date 06/25/20             OT Long Term  Goals - 06/04/20 1701      OT LONG TERM GOAL #1   Title Pt R hand digits flexion increase for pt to touch palm to grip ADLs objects with pain less than 3/10 and extentio lag less than -15    Baseline extention lag -20 at 2nd [facility] and PIP , -20 at PIP of 3rd - MC flexion MC 35-50 and PIP's 50-85 - pain increase 4-7/10    Time 5    Period Weeks    Status New    Target Date 07/09/20      OT LONG TERM GOAL #2   Title Grip strength increase to more than 50% compare to L to grip and pick up objects more than 5 lbs without symptoms    Baseline NT - 5 1/2 wks s/p - in splint all the time    Time 6    Period Weeks    Status New    Target Date 07/16/20      OT LONG TERM GOAL #3   Title Prehension strength increase to more than 50% compare to L to cut food, open packages, use in farm with animals    Baseline NT - 5 1/2 wks s/p - in splint 100% of time - pain 4-7/10    Time 6    Period Weeks    Status New    Target Date 07/16/20      OT LONG TERM GOAL #4   Title Extention of 2nd and 3rd to be less than -15 lag and able to push buttons and donn gloves    Baseline 5 1/2 wks s/p - extention lag -20 at 2nd [facility] and PIP , 3rd PIP - in splint all time time    Time 6    Period Weeks    Status New    Target Date 07/16/20      OT LONG TERM GOAL #5   Title Function on PRWHE improve more than 30 points    Baseline function score at eval 50/50 - in splint - no using hand in anything    Time 6    Period Weeks    Status New    Target Date 07/16/20                 Plan - 06/04/20 1605    Clinical Impression Statement Pt present 5 1/2 wks s/p repair of right EIP and index EDC tendons and Irrigation and debridement with primary repair of right dorsal hand laceration (approximately 10 cm in length). Pt arrive with hand in dorsal block splint forearm base- pt report his been working on scar , and taking splint off for bathing and sitting watching tv- pt with lag of 20 degrees at 2nd PIP and MC -and  10 at 3rd - pt with decrease flexion at [facility] and PIP and composite -as well as scar adhesions limiting extention and flexion -and hyper sensitive to textures over scar -decrease wrist AROM - and increase edema and pain 4/10 at rest and increase to 7/10 with AROM - all limting pt use of R dominant hand in ADL's and IADl's -fabricated MC block splint to wear  for 30 min to hour after HEP every 2 hrs -  during HEP MC flexion , hook fist and composite fist - AROM and place and hold full extention - focus on MC and PIP extentoin of 2nd and 3rd - rest of time in dorsal block splint and night time -will assess progress and add dynamic component to splint and monitor extention lag every session    OT Occupational Profile and History Problem Focused Assessment - Including review of records relating to presenting problem    Occupational performance deficits (Please refer to evaluation for details): ADL's;IADL's;Rest and Sleep;Work;Play;Leisure;Social Participation    Body Structure / Function / Physical Skills ADL;Edema;Dexterity;Decreased knowledge of use of DME;Decreased knowledge of precautions;Flexibility;ROM;UE functional use;Scar mobility;Pain;Strength;IADL;Coordination    Rehab Potential Good    Clinical Decision Making Limited treatment options, no task modification necessary    Comorbidities Affecting Occupational Performance: None   did not start therapy until 5 1/2 wks s/p   Modification or Assistance to Complete Evaluation  No modification of tasks or assist necessary to complete eval    OT Frequency 2x / week    OT Duration 6 weeks    OT Treatment/Interventions Self-care/ADL training;Contrast Bath;Fluidtherapy;Moist Heat;Paraffin;Therapeutic exercise;Ultrasound;DME and/or AE instruction;Manual Therapy;Passive range of motion;Scar mobilization;Splinting;Patient/family education    Consulted and Agree with Plan of Care Patient           Patient will benefit from skilled therapeutic intervention in  order to improve the following deficits and impairments:   Body Structure / Function / Physical Skills: ADL,Edema,Dexterity,Decreased knowledge of use of DME,Decreased knowledge of precautions,Flexibility,ROM,UE functional use,Scar mobility,Pain,Strength,IADL,Coordination       Visit Diagnosis: Scar condition and fibrosis of skin - Plan: Ot plan of care cert/re-cert  Stiffness of right hand, not elsewhere classified - Plan: Ot plan of care cert/re-cert  Stiffness of right wrist, not elsewhere classified - Plan: Ot plan of care cert/re-cert  Pain in right hand - Plan: Ot plan of care cert/re-cert  Muscle weakness (generalized) - Plan: Ot plan of care cert/re-cert    Problem List Patient Active Problem List   Diagnosis Date Noted  . Toothache 02/20/2020  . Headache 02/20/2020  . Smoking 02/20/2020  . Umbilical hernia, incarcerated   . Back pain 03/27/2019  . Benign hypertension 05/02/2018  . Obesity (BMI 30.0-34.9) 04/30/2018  . Healthcare maintenance 10/19/2016  . Flu vaccine need 04/20/2016  . GERD (gastroesophageal reflux disease) 04/01/2015  . Gout 04/01/2015  . Mixed hyperlipidemia 10/01/2014  . Hypothyroidism 05/21/2014    Rosalyn Gess OTR/l,CLT 06/04/2020, 5:11 PM  [facility] [address] [place], [address] Phone: [phone]   Fax:  [phone]  Name: Douglas Bird MRN: [id number] Date of Birth: Jul 19, 1965

## 2020-06-07 ENCOUNTER — Other Ambulatory Visit: Payer: Self-pay | Admitting: Gerontology

## 2020-06-07 DIAGNOSIS — E782 Mixed hyperlipidemia: Secondary | ICD-10-CM

## 2020-06-08 ENCOUNTER — Ambulatory Visit: Payer: PRIVATE HEALTH INSURANCE | Admitting: Occupational Therapy

## 2020-06-08 ENCOUNTER — Ambulatory Visit: Payer: Self-pay | Admitting: Occupational Therapy

## 2020-06-08 ENCOUNTER — Other Ambulatory Visit: Payer: Self-pay

## 2020-06-08 DIAGNOSIS — L905 Scar conditions and fibrosis of skin: Secondary | ICD-10-CM | POA: Diagnosis not present

## 2020-06-08 DIAGNOSIS — M25641 Stiffness of right hand, not elsewhere classified: Secondary | ICD-10-CM

## 2020-06-08 DIAGNOSIS — M25631 Stiffness of right wrist, not elsewhere classified: Secondary | ICD-10-CM

## 2020-06-08 DIAGNOSIS — M79641 Pain in right hand: Secondary | ICD-10-CM

## 2020-06-08 DIAGNOSIS — M6281 Muscle weakness (generalized): Secondary | ICD-10-CM

## 2020-06-08 NOTE — Therapy (Signed)
[place] [facility] [address] [place], [address] Phone: [phone]   Fax:  [phone]  Occupational Therapy Treatment  Patient Details  Name: DEWARREN LEDBETTER MRN: [id number] Date of Birth: August 20, 1965 Referring Provider (OT): Dr Roland Rack   Encounter Date: 06/08/2020   OT End of Session - 06/08/20 1606    Visit Number 2    Number of Visits 12    Date for OT Re-Evaluation 07/16/20    OT Start Time 1228    OT Stop Time 1302    OT Time Calculation (min) 34 min    Activity Tolerance Patient tolerated treatment well    Behavior During Therapy [facility] for tasks assessed/performed           Past Medical History:  Diagnosis Date  . Diverticulitis 2011  . GERD (gastroesophageal reflux disease)   . Hyperlipidemia   . Hypertension    controlled on meds  . Hypothyroidism   . Thyroid disease     Past Surgical History:  Procedure Laterality Date  . CHOLECYSTECTOMY  June 2013  . COLONOSCOPY WITH PROPOFOL N/A 04/28/2019   Procedure: COLONOSCOPY WITH PROPOFOL;  Surgeon: Lin Landsman, MD;  Location: [place];  Service: Endoscopy;  Laterality: N/A;  priority 4  . HERNIA REPAIR Bilateral    inguinal  . REPAIR EXTENSOR TENDON Right 04/28/2020   Procedure: EXPLORATION OF RIGHT DORSAL HAND LACERATION WITH REPAIR OF INDEX EXTENSOR TENDON LACERATION;  Surgeon: Corky Mull, MD;  Location: [facility];  Service: Orthopedics;  Laterality: Right;  . UMBILICAL HERNIA REPAIR N/A 08/11/2019   Procedure: HERNIA REPAIR UMBILICAL ADULT;  Surgeon: Fredirick Maudlin, MD;  Location: [facility];  Service: General;  Laterality: N/A;  RNFA    There were no vitals filed for this visit.   Subjective Assessment - 06/08/20 1245    Subjective  Pain about 2/10 -was doing great and then yesterday was sitting working it - I will sit and even work it inbetween the exercises and then last night I maybe force it little to much and after that my  hand swell. I did hear like a pop back of my hand -did ice and it helped - but this am again swollen    Pertinent History Pt had saw accident on 04/26/20 at [facility] and then Surgery by Dr Roland Rack on 04/28/20 - Primary repair of right EIP and index EDC tendons.2. Irrigation and debridement with primary repair of right dorsal hand laceration (approximately 10 cm in length). Sutures were taken out on 05/10/20 and kept on dorsal splint - and then follow up again at [facility] 05/24/20 - kept in dorsal forearm splint and refer to OT/hand therapy - but with workers comp -referral to OT did not happend until 06/02/20 - pt now about 5 1/2 wks s/p    Patient Stated Goals Want to get the use of my R hand back to be able to work and take care of the animals and yard /garden at home    Currently in Pain? Yes    Pain Score 2    increase wtih AROM flexion 4/10   Pain Location Hand    Pain Orientation Right    Pain Descriptors / Indicators Aching;Tender;Tightness;Sore    Pain Type Surgical pain;Acute pain    Pain Onset More than a month ago    Pain Frequency Constant              [facility] OT Assessment - 06/08/20  0001      AROM   Right Wrist Extension 50 Degrees    Right Wrist Flexion 90 Degrees    Right Wrist Radial Deviation 25 Degrees    Right Wrist Ulnar Deviation 35 Degrees      Right Hand AROM   R Index  MCP 0-90 60 Degrees    R Index PIP 0-100 80 Degrees    R Long  MCP 0-90 60 Degrees    R Long PIP 0-100 85 Degrees    R Ring  MCP 0-90 60 Degrees    R Ring PIP 0-100 90 Degrees    R Little  MCP 0-90 70 Degrees    R Little PIP 0-100 85 Degrees            AROM measure for wrist and digits in R hand - FANTASTIC progress- see flow sheet - REINFORCE for pt to keep with HEP 4-5 x day and do not do in between exercises - do not aggravate and push to hard  Keep HEP to slight pull- less than 2/10 -  Assess extention - extention still -30 at 2nd [facility] , -10 at PIP  And -20 and -15 at 30rd [facility] and PIP   same as last time          OT Treatments/Exercises (OP) - 06/08/20 0001      RUE Contrast Bath   Time 11 minutes    Comments R hand -prior to ROM and afterwards to decrease pain and edema           Contrast done after measurements taken - edema decrease -and fitted with isotoner glove to wear most all the time - MC block splint fabricated last time  - and pt to use for 30 min to 2 hrs after HEP to increase PIP flexion and extention  - for increase hook fist - but assist full extention if notice lag increase dorsal block splint wearing  No gripping or use of hand -and inbetween and night time in dorsal block splint   Pt to hold off on HEP for AROM - only one more session today - light Tendon glides -AROM - block - MC flexion , hook fist - facilitate place and hold for extention  Composite fist around cylinder - large - - place and hold  extention -  10 reps -  Opposition - pick up 2 cm foam block - alternate digits- 5 reps each  Wrist AROM flexion and extention - open hand and loose fist  10reps  And otherwise do contrast several times until tomorrow -and if pain and edema is better - can start back HEP 4 x day            OT Education - 06/08/20 1606    Education Details progress and HEP    Person(s) Educated Patient    Methods Explanation;Demonstration;Tactile cues;Verbal cues;Handout    Comprehension Verbal cues required;Returned demonstration;Verbalized understanding            OT Short Term Goals - 06/04/20 1656      OT SHORT TERM GOAL #1   Title Pt to be independent in HEP and desentitization to tolerate scar massage and different textures    Baseline 4/10 at rest of scar - increase to 7/10  - very tender for scar massage and sensitive to soft and rougher textures - scar adhere - no hyper extention of MC 2nd and 3rd    Time 3    Period Weeks  Status New    Target Date 06/25/20      OT SHORT TERM GOAL #2   Title Pt to be indepenent with use of  MC block plint and dynamic  splints to increase flexion in digits while maintaining extention less than -15 degree lag    Baseline MC's -20 2nd and 3rd - -20 2nd PIP , -10 3rd PIP - flexion MC's 35-60, PIP's 50-85 degrees    Time 3    Period Weeks    Status New    Target Date 06/25/20             OT Long Term Goals - 06/04/20 1701      OT LONG TERM GOAL #1   Title Pt R hand digits flexion increase for pt to touch palm to grip ADLs objects with pain less than 3/10 and extentio lag less than -15    Baseline extention lag -20 at 2nd [facility] and PIP , -20 at PIP of 3rd - MC flexion MC 35-50 and PIP's 50-85 - pain increase 4-7/10    Time 5    Period Weeks    Status New    Target Date 07/09/20      OT LONG TERM GOAL #2   Title Grip strength increase to more than 50% compare to L to grip and pick up objects more than 5 lbs without symptoms    Baseline NT - 5 1/2 wks s/p - in splint all the time    Time 6    Period Weeks    Status New    Target Date 07/16/20      OT LONG TERM GOAL #3   Title Prehension strength increase to more than 50% compare to L to cut food, open packages, use in farm with animals    Baseline NT - 5 1/2 wks s/p - in splint 100% of time - pain 4-7/10    Time 6    Period Weeks    Status New    Target Date 07/16/20      OT LONG TERM GOAL #4   Title Extention of 2nd and 3rd to be less than -15 lag and able to push buttons and donn gloves    Baseline 5 1/2 wks s/p - extention lag -20 at 2nd [facility] and PIP , 3rd PIP - in splint all time time    Time 6    Period Weeks    Status New    Target Date 07/16/20      OT LONG TERM GOAL #5   Title Function on PRWHE improve more than 30 points    Baseline function score at eval 50/50 - in splint - no using hand in anything    Time 6    Period Weeks    Status New    Target Date 07/16/20                 Plan - 06/08/20 1607    Clinical Impression Statement Pt 6 wks s/p repair of right EIP and index EDC tendons and Irrigation and debridement with  primary repair of right dorsal hand laceration (approximately 10 cm in length). Pt arrive with hand in dorsal block splint forearm base- pt report his been working on scar ,  and exercise and was doing great until last night - doing his exercises and after that was swollen and tender - pt report he heard "pop" - assess extention of digits - and was same - had PIP  and MC extention same as last week - BUT pt showed GREAT progress in flexion- ? if pt over done HEP and pushed to hard - and doing more than HEP was - pt for the next 24 hrs to hold off on HEP - do only one more AROM session this date- do contrast several times and rest in dorsal block splint - start back pain free AROM tomorrow - but only 4-5x day - NOT more - pt cont to be  hyper sensitive to textures over scar and tender  -pain decrease to 2/10 at rest and with AROM 4/10 - was last week 4-7/10 - pt to cont tmorrow with MC block splint  2 hrs after HEP and then rest of time in dorsal block splint  -will assess progress and add dynamic component to splint and monitor extention lag every session    OT Occupational Profile and History Problem Focused Assessment - Including review of records relating to presenting problem    Occupational performance deficits (Please refer to evaluation for details): ADL's;IADL's;Rest and Sleep;Work;Play;Leisure;Social Participation    Body Structure / Function / Physical Skills ADL;Edema;Dexterity;Decreased knowledge of use of DME;Decreased knowledge of precautions;Flexibility;ROM;UE functional use;Scar mobility;Pain;Strength;IADL;Coordination    Rehab Potential Good    Clinical Decision Making Limited treatment options, no task modification necessary    Comorbidities Affecting Occupational Performance: None    Modification or Assistance to Complete Evaluation  No modification of tasks or assist necessary to complete eval    OT Frequency 2x / week    OT Duration 6 weeks    OT Treatment/Interventions Self-care/ADL  training;Contrast Bath;Fluidtherapy;Moist Heat;Paraffin;Therapeutic exercise;Ultrasound;DME and/or AE instruction;Manual Therapy;Passive range of motion;Scar mobilization;Splinting;Patient/family education    Consulted and Agree with Plan of Care Patient           Patient will benefit from skilled therapeutic intervention in order to improve the following deficits and impairments:   Body Structure / Function / Physical Skills: ADL,Edema,Dexterity,Decreased knowledge of use of DME,Decreased knowledge of precautions,Flexibility,ROM,UE functional use,Scar mobility,Pain,Strength,IADL,Coordination       Visit Diagnosis: Stiffness of right hand, not elsewhere classified  Scar condition and fibrosis of skin  Stiffness of right wrist, not elsewhere classified  Pain in right hand  Muscle weakness (generalized)    Problem List Patient Active Problem List   Diagnosis Date Noted  . Toothache 02/20/2020  . Headache 02/20/2020  . Smoking 02/20/2020  . Umbilical hernia, incarcerated   . Back pain 03/27/2019  . Benign hypertension 05/02/2018  . Obesity (BMI 30.0-34.9) 04/30/2018  . Healthcare maintenance 10/19/2016  . Flu vaccine need 04/20/2016  . GERD (gastroesophageal reflux disease) 04/01/2015  . Gout 04/01/2015  . Mixed hyperlipidemia 10/01/2014  . Hypothyroidism 05/21/2014    Rosalyn Gess  OTR/L,CLT 06/08/2020, 4:13 PM  [place] [facility] [address], [place], [address] Phone: [phone]   Fax:  [phone]  Name: YAREL RUSHLOW MRN: [id number] Date of Birth: 01/29/1966

## 2020-06-10 ENCOUNTER — Other Ambulatory Visit: Payer: Self-pay | Admitting: Gerontology

## 2020-06-10 ENCOUNTER — Ambulatory Visit: Payer: PRIVATE HEALTH INSURANCE | Admitting: Occupational Therapy

## 2020-06-10 ENCOUNTER — Other Ambulatory Visit: Payer: Self-pay

## 2020-06-10 DIAGNOSIS — M25641 Stiffness of right hand, not elsewhere classified: Secondary | ICD-10-CM

## 2020-06-10 DIAGNOSIS — M25631 Stiffness of right wrist, not elsewhere classified: Secondary | ICD-10-CM

## 2020-06-10 DIAGNOSIS — L905 Scar conditions and fibrosis of skin: Secondary | ICD-10-CM | POA: Diagnosis not present

## 2020-06-10 DIAGNOSIS — M6281 Muscle weakness (generalized): Secondary | ICD-10-CM

## 2020-06-10 DIAGNOSIS — M79641 Pain in right hand: Secondary | ICD-10-CM

## 2020-06-10 NOTE — Therapy (Signed)
[place] [address], [place], [address] Phone: [phone]   Fax:  [phone]  Occupational Therapy Treatment  Patient Details  Name: Douglas Bird MRN: [id number] Date of Birth: 05/02/1965 Referring Provider (OT): Dr Roland Rack   Encounter Date: 06/10/2020   OT End of Session - 06/10/20 0748    Visit Number 3    Number of Visits 12    Date for OT Re-Evaluation 07/16/20    OT Start Time 0730    OT Stop Time 0812    OT Time Calculation (min) 42 min    Activity Tolerance Patient tolerated treatment well    Behavior During Therapy [facility] for tasks assessed/performed           Past Medical History:  Diagnosis Date  . Diverticulitis 2011  . GERD (gastroesophageal reflux disease)   . Hyperlipidemia   . Hypertension    controlled on meds  . Hypothyroidism   . Thyroid disease     Past Surgical History:  Procedure Laterality Date  . CHOLECYSTECTOMY  June 2013  . COLONOSCOPY WITH PROPOFOL N/A 04/28/2019   Procedure: COLONOSCOPY WITH PROPOFOL;  Surgeon: Lin Landsman, MD;  Location: [place];  Service: Endoscopy;  Laterality: N/A;  priority 4  . HERNIA REPAIR Bilateral    inguinal  . REPAIR EXTENSOR TENDON Right 04/28/2020   Procedure: EXPLORATION OF RIGHT DORSAL HAND LACERATION WITH REPAIR OF INDEX EXTENSOR TENDON LACERATION;  Surgeon: Corky Mull, MD;  Location: [facility];  Service: Orthopedics;  Laterality: Right;  . UMBILICAL HERNIA REPAIR N/A 08/11/2019   Procedure: HERNIA REPAIR UMBILICAL ADULT;  Surgeon: Fredirick Maudlin, MD;  Location: [facility];  Service: General;  Laterality: N/A;  RNFA    There were no vitals filed for this visit.   Subjective Assessment - 06/10/20 0745    Subjective  Done like you told me and did squeeze objects or over done the exercises  - done hot and cold - swelling and pain down - but still so tender and when bending it pull and knuckle hurts soo bad     Pertinent History Pt had saw accident on 04/26/20 at [facility] and then Surgery by Dr Roland Rack on 04/28/20 - Primary repair of right EIP and index EDC tendons.2. Irrigation and debridement with primary repair of right dorsal hand laceration (approximately 10 cm in length). Sutures were taken out on 05/10/20 and kept on dorsal splint - and then follow up again at [facility] 05/24/20 - kept in dorsal forearm splint and refer to OT/hand therapy - but with workers comp -referral to OT did not happend until 06/02/20 - pt now about 5 1/2 wks s/p    Patient Stated Goals Want to get the use of my R hand back to be able to work and take care of the animals and yard /garden at home    Currently in Pain? Yes    Pain Score 2     Pain Location Hand    Pain Orientation Right    Pain Descriptors / Indicators Aching;Tightness;Tender    Pain Type Surgical pain;Acute pain    Pain Onset More than a month ago    Pain Frequency Intermittent              [facility] OT Assessment - 06/10/20 0001      Right Hand AROM   R Index  MCP 0-90 60 Degrees    R Index PIP 0-100 80 Degrees  R Long  MCP 0-90 60 Degrees    R Long PIP 0-100 85 Degrees    R Ring  MCP 0-90 60 Degrees    R Ring PIP 0-100 90 Degrees    R Little  MCP 0-90 70 Degrees    R Little PIP 0-100 85 Degrees           maintain great progress from last time -and pain was down and edema - but increase after doing AROM           OT Treatments/Exercises (OP) - 06/10/20 0001      RUE Fluidotherapy   Number Minutes Fluidotherapy 13 Minutes    RUE Fluidotherapy Location Hand;Wrist    Comments 2  min ice inbetween to decrease pain, edema and  increase ROM            Contrast done after measurements taken- but used fluidotherapy with ice - to allow more AROM -tendon glides and wrist flexion , extention   - edema decrease -pt to cont with isotoner glove to wear most all the time - MC block splint fabricated eval  - and pt to use for 30 min to 2 hrs after HEP to  increase PIP flexion and extention  - for increase hook fist - but assist full extention if notice lag increase dorsal block splint wearing  No gripping or use of hand -and inbetween and night time in dorsal block splint     AAROM add rolling over foam roller for facilatation hyper extention to MC's and PIP extention - 20 reps no pain  Cont and done t Tendon glides -AROM - block - MC flexion , hook fist - facilitate place and hold for extention  Composite fist around small cylinder now- - place and hold extention -  10 reps -  Opposition - pick up 1 cm foam block - alternate digits- 5 reps each  Wrist AROM flexion and extention - open hand and loose fist  10reps  Cont HEP 4-5 x day -and discuss with surgeon about something to assist for swelling and pain         OT Education - 06/10/20 0748    Education Details progress and HEP    Person(s) Educated Patient    Methods Explanation;Demonstration;Tactile cues;Verbal cues;Handout    Comprehension Verbal cues required;Returned demonstration;Verbalized understanding            OT Short Term Goals - 06/04/20 1656      OT SHORT TERM GOAL #1   Title Pt to be independent in HEP and desentitization to tolerate scar massage and different textures    Baseline 4/10 at rest of scar - increase to 7/10  - very tender for scar massage and sensitive to soft and rougher textures - scar adhere - no hyper extention of MC 2nd and 3rd    Time 3    Period Weeks    Status New    Target Date 06/25/20      OT SHORT TERM GOAL #2   Title Pt to be indepenent with use of  MC block plint and dynamic splints to increase flexion in digits while maintaining extention less than -15 degree lag    Baseline MC's -20 2nd and 3rd - -20 2nd PIP , -10 3rd PIP - flexion MC's 35-60, PIP's 50-85 degrees    Time 3    Period Weeks    Status New    Target Date 06/25/20  OT Long Term Goals - 06/04/20 1701      OT LONG TERM GOAL #1   Title Pt R hand  digits flexion increase for pt to touch palm to grip ADLs objects with pain less than 3/10 and extentio lag less than -15    Baseline extention lag -20 at 2nd [facility] and PIP , -20 at PIP of 3rd - MC flexion MC 35-50 and PIP's 50-85 - pain increase 4-7/10    Time 5    Period Weeks    Status New    Target Date 07/09/20      OT LONG TERM GOAL #2   Title Grip strength increase to more than 50% compare to L to grip and pick up objects more than 5 lbs without symptoms    Baseline NT - 5 1/2 wks s/p - in splint all the time    Time 6    Period Weeks    Status New    Target Date 07/16/20      OT LONG TERM GOAL #3   Title Prehension strength increase to more than 50% compare to L to cut food, open packages, use in farm with animals    Baseline NT - 5 1/2 wks s/p - in splint 100% of time - pain 4-7/10    Time 6    Period Weeks    Status New    Target Date 07/16/20      OT LONG TERM GOAL #4   Title Extention of 2nd and 3rd to be less than -15 lag and able to push buttons and donn gloves    Baseline 5 1/2 wks s/p - extention lag -20 at 2nd [facility] and PIP , 3rd PIP - in splint all time time    Time 6    Period Weeks    Status New    Target Date 07/16/20      OT LONG TERM GOAL #5   Title Function on PRWHE improve more than 30 points    Baseline function score at eval 50/50 - in splint - no using hand in anything    Time 6    Period Weeks    Status New    Target Date 07/16/20                 Plan - 06/10/20 0748    Clinical Impression Statement Pt is 6 1/2 wks s/p Repair of R 2nd digi EIP and EDC - irrigation and debridement - pt show great progress in AROM in [place] , PIP and composite fist - while maintaining extention - did not do dynamic splint because of pt did not start therapy until 51/2 wks and did not need it - but per protocol can add now PROM and dynamic component for flexion - but pt pain and edema over dorsal hand over 2nd and 3rd [facility] 's limiting - pt to see surgeon MOnday -and  discuss anything to get to help for edema and pain - also pt over done sometimes HEP - need reinforcement not to push to hard or over do HEP    OT Occupational Profile and History Problem Focused Assessment - Including review of records relating to presenting problem    Occupational performance deficits (Please refer to evaluation for details): ADL's;IADL's;Rest and Sleep;Work;Play;Leisure;Social Participation    Body Structure / Function / Physical Skills ADL;Edema;Dexterity;Decreased knowledge of use of DME;Decreased knowledge of precautions;Flexibility;ROM;UE functional use;Scar mobility;Pain;Strength;IADL;Coordination    Rehab Potential Good    Clinical Decision  Making Limited treatment options, no task modification necessary    Comorbidities Affecting Occupational Performance: None    Modification or Assistance to Complete Evaluation  No modification of tasks or assist necessary to complete eval    OT Frequency 2x / week    OT Duration 6 weeks    OT Treatment/Interventions Self-care/ADL training;Contrast Bath;Fluidtherapy;Moist Heat;Paraffin;Therapeutic exercise;Ultrasound;DME and/or AE instruction;Manual Therapy;Passive range of motion;Scar mobilization;Splinting;Patient/family education    Consulted and Agree with Plan of Care Patient           Patient will benefit from skilled therapeutic intervention in order to improve the following deficits and impairments:   Body Structure / Function / Physical Skills: ADL,Edema,Dexterity,Decreased knowledge of use of DME,Decreased knowledge of precautions,Flexibility,ROM,UE functional use,Scar mobility,Pain,Strength,IADL,Coordination       Visit Diagnosis: Stiffness of right hand, not elsewhere classified  Scar condition and fibrosis of skin  Stiffness of right wrist, not elsewhere classified  Pain in right hand  Muscle weakness (generalized)    Problem List Patient Active Problem List   Diagnosis Date Noted  . Toothache 02/20/2020   . Headache 02/20/2020  . Smoking 02/20/2020  . Umbilical hernia, incarcerated   . Back pain 03/27/2019  . Benign hypertension 05/02/2018  . Obesity (BMI 30.0-34.9) 04/30/2018  . Healthcare maintenance 10/19/2016  . Flu vaccine need 04/20/2016  . GERD (gastroesophageal reflux disease) 04/01/2015  . Gout 04/01/2015  . Mixed hyperlipidemia 10/01/2014  . Hypothyroidism 05/21/2014    Rosalyn Gess  OTR/L,CLT 06/10/2020, 8:17 AM  [place] [facility] [address] [place], [address] Phone: [phone]   Fax:  [phone]  Name: NIKHOLAS GEFFRE MRN: [id number] Date of Birth: [birth year]

## 2020-06-15 ENCOUNTER — Other Ambulatory Visit: Payer: Self-pay

## 2020-06-15 ENCOUNTER — Ambulatory Visit: Payer: PRIVATE HEALTH INSURANCE | Admitting: Occupational Therapy

## 2020-06-15 DIAGNOSIS — M25631 Stiffness of right wrist, not elsewhere classified: Secondary | ICD-10-CM

## 2020-06-15 DIAGNOSIS — M25641 Stiffness of right hand, not elsewhere classified: Secondary | ICD-10-CM

## 2020-06-15 DIAGNOSIS — M79641 Pain in right hand: Secondary | ICD-10-CM

## 2020-06-15 DIAGNOSIS — M6281 Muscle weakness (generalized): Secondary | ICD-10-CM

## 2020-06-15 DIAGNOSIS — L905 Scar conditions and fibrosis of skin: Secondary | ICD-10-CM | POA: Diagnosis not present

## 2020-06-15 NOTE — Therapy (Signed)
[place] [facility] [address], [place], [address] Phone: [phone]   Fax:  [phone]  Occupational Therapy Treatment  Patient Details  Name: Douglas Bird MRN: [id number] Date of Birth: [birth year] Referring Provider (OT): Dr Roland Rack   Encounter Date: 06/15/2020   OT End of Session - 06/15/20 1140    Visit Number 4    Number of Visits 12    Date for OT Re-Evaluation 07/16/20    OT Start Time 5885    OT Stop Time 1119    OT Time Calculation (min) 44 min    Activity Tolerance Patient tolerated treatment well    Behavior During Therapy [facility] for tasks assessed/performed           Past Medical History:  Diagnosis Date  . Diverticulitis 2011  . GERD (gastroesophageal reflux disease)   . Hyperlipidemia   . Hypertension    controlled on meds  . Hypothyroidism   . Thyroid disease     Past Surgical History:  Procedure Laterality Date  . CHOLECYSTECTOMY  June 2013  . COLONOSCOPY WITH PROPOFOL N/A 04/28/2019   Procedure: COLONOSCOPY WITH PROPOFOL;  Surgeon: Lin Landsman, MD;  Location: [place];  Service: Endoscopy;  Laterality: N/A;  priority 4  . HERNIA REPAIR Bilateral    inguinal  . REPAIR EXTENSOR TENDON Right 04/28/2020   Procedure: EXPLORATION OF RIGHT DORSAL HAND LACERATION WITH REPAIR OF INDEX EXTENSOR TENDON LACERATION;  Surgeon: Corky Mull, MD;  Location: [facility];  Service: Orthopedics;  Laterality: Right;  . UMBILICAL HERNIA REPAIR N/A 08/11/2019   Procedure: HERNIA REPAIR UMBILICAL ADULT;  Surgeon: Fredirick Maudlin, MD;  Location: [facility];  Service: General;  Laterality: N/A;  RNFA    There were no vitals filed for this visit.   Subjective Assessment - 06/15/20 1137    Subjective  I seen Dr Roland Rack- he was happy with progress and I am still out of work until next appt with him - pain and swelling better    Pertinent History Pt had saw accident on 04/26/20 at [facility] and then  Surgery by Dr Roland Rack on 04/28/20 - Primary repair of right EIP and index EDC tendons.2. Irrigation and debridement with primary repair of right dorsal hand laceration (approximately 10 cm in length). Sutures were taken out on 05/10/20 and kept on dorsal splint - and then follow up again at [facility] 05/24/20 - kept in dorsal forearm splint and refer to OT/hand therapy - but with workers comp -referral to OT did not happend until 06/02/20 - pt now about 5 1/2 wks s/p    Patient Stated Goals Want to get the use of my R hand back to be able to work and take care of the animals and yard /garden at home    Currently in Pain? Yes    Pain Score 4     Pain Location Hand    Pain Orientation Right    Pain Descriptors / Indicators Aching;Tightness;Tender    Pain Type Acute pain;Surgical pain    Pain Onset More than a month ago    Pain Frequency Intermittent              [facility] OT Assessment - 06/15/20 0001      AROM   Right Wrist Extension 55 Degrees    Right Wrist Flexion 90 Degrees    Right Wrist Radial Deviation 25 Degrees    Right Wrist Ulnar Deviation 35 Degrees  Right Hand AROM   R Index  MCP 0-90 70 Degrees    R Index PIP 0-100 85 Degrees    R Long  MCP 0-90 70 Degrees    R Long PIP 0-100 85 Degrees    R Ring  MCP 0-90 70 Degrees    R Ring PIP 0-100 95 Degrees    R Little  MCP 0-90 80 Degrees    R Little PIP 0-100 85 Degrees                    OT Treatments/Exercises (OP) - 06/15/20 0001      RUE Fluidotherapy   Number Minutes Fluidotherapy 8 Minutes    RUE Fluidotherapy Location Hand;Wrist    Comments AROM in all planes prior to PROM and soft tissue            Soft tissue mobs done to webspace and MC's of R hand -and joint - prior to ROM  Fitted with knuckle bender splint 2 min - use on band on radial side and 2 ulnar side- and followed by PROM in splint for PIP- less than 1-2/10 pull  Followed by AROM to bar in palm - in splint 10 reps- keep pull under 2/10   Pt to  cont with MC block splint and dorsal block splint until next session      After knuckle bender splint use 2x 2 min - with PROM and AROM in splint - To do Tendon glides -AROM - block - MC flexion , hook fist  Composite fist around 3 cm foam block - 10 reps  - place and hold extention inbetween all 10 reps -  Opposition -- alternate digits- 5 reps each  Thumb PA and RA AROM - 10 reps  3 x day   Add this date also AAROM for wrist flexion , ext over edge of table -10 reps - no pressure thru palm - reinforce with pt  Wrist AROM flexion and extention - open hand and loose fist afterwards 10reps Cont HEP 3 x day -pain or pull under 3/10         OT Education - 06/15/20 1139    Education Details progress and cahnges HEP    Person(s) Educated Patient    Methods Explanation;Demonstration;Tactile cues;Verbal cues;Handout    Comprehension Verbal cues required;Returned demonstration;Verbalized understanding            OT Short Term Goals - 06/04/20 1656      OT SHORT TERM GOAL #1   Title Pt to be independent in HEP and desentitization to tolerate scar massage and different textures    Baseline 4/10 at rest of scar - increase to 7/10  - very tender for scar massage and sensitive to soft and rougher textures - scar adhere - no hyper extention of MC 2nd and 3rd    Time 3    Period Weeks    Status New    Target Date 06/25/20      OT SHORT TERM GOAL #2   Title Pt to be indepenent with use of  MC block plint and dynamic splints to increase flexion in digits while maintaining extention less than -15 degree lag    Baseline MC's -20 2nd and 3rd - -20 2nd PIP , -10 3rd PIP - flexion MC's 35-60, PIP's 50-85 degrees    Time 3    Period Weeks    Status New    Target Date 06/25/20  OT Long Term Goals - 06/04/20 1701      OT LONG TERM GOAL #1   Title Pt R hand digits flexion increase for pt to touch palm to grip ADLs objects with pain less than 3/10 and extentio lag less  than -15    Baseline extention lag -20 at 2nd [facility] and PIP , -20 at PIP of 3rd - MC flexion MC 35-50 and PIP's 50-85 - pain increase 4-7/10    Time 5    Period Weeks    Status New    Target Date 07/09/20      OT LONG TERM GOAL #2   Title Grip strength increase to more than 50% compare to L to grip and pick up objects more than 5 lbs without symptoms    Baseline NT - 5 1/2 wks s/p - in splint all the time    Time 6    Period Weeks    Status New    Target Date 07/16/20      OT LONG TERM GOAL #3   Title Prehension strength increase to more than 50% compare to L to cut food, open packages, use in farm with animals    Baseline NT - 5 1/2 wks s/p - in splint 100% of time - pain 4-7/10    Time 6    Period Weeks    Status New    Target Date 07/16/20      OT LONG TERM GOAL #4   Title Extention of 2nd and 3rd to be less than -15 lag and able to push buttons and donn gloves    Baseline 5 1/2 wks s/p - extention lag -20 at 2nd [facility] and PIP , 3rd PIP - in splint all time time    Time 6    Period Weeks    Status New    Target Date 07/16/20      OT LONG TERM GOAL #5   Title Function on PRWHE improve more than 30 points    Baseline function score at eval 50/50 - in splint - no using hand in anything    Time 6    Period Weeks    Status New    Target Date 07/16/20                 Plan - 06/15/20 1140    Clinical Impression Statement Pt is tomorrow 7 wks s/p Repair of R 2nd digit EIP and EDC - irrigation and debridement - pt show increase MC flexion , PIP flexion while maintain his extention - pain and edema better coming in  - add per protocol dynamic stretching to MC flexion , PROM to composite in knucklebender splint -and PROM for wrist flexion ,extention - reinforce to keep pain under 2/10 -and slight pull -as well as monitor extention lag and splint wearing still - will start weaning out of splint end of this week    OT Occupational Profile and History Problem Focused Assessment -  Including review of records relating to presenting problem    Occupational performance deficits (Please refer to evaluation for details): ADL's;IADL's;Rest and Sleep;Work;Play;Leisure;Social Participation    Body Structure / Function / Physical Skills ADL;Edema;Dexterity;Decreased knowledge of use of DME;Decreased knowledge of precautions;Flexibility;ROM;UE functional use;Scar mobility;Pain;Strength;IADL;Coordination    Rehab Potential Good    Clinical Decision Making Limited treatment options, no task modification necessary    Comorbidities Affecting Occupational Performance: None    Modification or Assistance to Complete Evaluation  No modification of tasks  or assist necessary to complete eval    OT Frequency 2x / week    OT Duration 6 weeks    OT Treatment/Interventions Self-care/ADL training;Contrast Bath;Fluidtherapy;Moist Heat;Paraffin;Therapeutic exercise;Ultrasound;DME and/or AE instruction;Manual Therapy;Passive range of motion;Scar mobilization;Splinting;Patient/family education    Consulted and Agree with Plan of Care Patient           Patient will benefit from skilled therapeutic intervention in order to improve the following deficits and impairments:   Body Structure / Function / Physical Skills: ADL,Edema,Dexterity,Decreased knowledge of use of DME,Decreased knowledge of precautions,Flexibility,ROM,UE functional use,Scar mobility,Pain,Strength,IADL,Coordination       Visit Diagnosis: Stiffness of right hand, not elsewhere classified  Scar condition and fibrosis of skin  Stiffness of right wrist, not elsewhere classified  Pain in right hand  Muscle weakness (generalized)    Problem List Patient Active Problem List   Diagnosis Date Noted  . Toothache 02/20/2020  . Headache 02/20/2020  . Smoking 02/20/2020  . Umbilical hernia, incarcerated   . Back pain 03/27/2019  . Benign hypertension 05/02/2018  . Obesity (BMI 30.0-34.9) 04/30/2018  . Healthcare maintenance  10/19/2016  . Flu vaccine need 04/20/2016  . GERD (gastroesophageal reflux disease) 04/01/2015  . Gout 04/01/2015  . Mixed hyperlipidemia 10/01/2014  . Hypothyroidism 05/21/2014    Rosalyn Gess OTR/l,CLT 06/15/2020, 11:43 AM  [place] [facility] [address] [place], [address] Phone: [phone]   Fax:  [phone]  Name: Douglas Bird MRN: [id number] Date of Birth: [birth year]

## 2020-06-17 ENCOUNTER — Ambulatory Visit: Payer: PRIVATE HEALTH INSURANCE | Admitting: Occupational Therapy

## 2020-06-17 ENCOUNTER — Other Ambulatory Visit: Payer: Self-pay

## 2020-06-17 DIAGNOSIS — M25641 Stiffness of right hand, not elsewhere classified: Secondary | ICD-10-CM

## 2020-06-17 DIAGNOSIS — L905 Scar conditions and fibrosis of skin: Secondary | ICD-10-CM | POA: Diagnosis not present

## 2020-06-17 DIAGNOSIS — M25631 Stiffness of right wrist, not elsewhere classified: Secondary | ICD-10-CM

## 2020-06-17 DIAGNOSIS — M6281 Muscle weakness (generalized): Secondary | ICD-10-CM

## 2020-06-17 DIAGNOSIS — M79641 Pain in right hand: Secondary | ICD-10-CM

## 2020-06-17 NOTE — Therapy (Signed)
[place] [facility] [address] [place], [address] Phone: [phone]   Fax:  [phone]  Occupational Therapy Treatment  Patient Details  Name: Douglas Bird MRN: [id number] Date of Birth: 03-06-1966 Referring Provider (OT): Dr Roland Rack   Encounter Date: 06/17/2020   OT End of Session - 06/17/20 1030    Visit Number 5    Number of Visits 12    Date for OT Re-Evaluation 07/16/20    OT Start Time 1030    OT Stop Time 1108    OT Time Calculation (min) 38 min    Activity Tolerance Patient tolerated treatment well    Behavior During Therapy [facility] for tasks assessed/performed           Past Medical History:  Diagnosis Date  . Diverticulitis 2011  . GERD (gastroesophageal reflux disease)   . Hyperlipidemia   . Hypertension    controlled on meds  . Hypothyroidism   . Thyroid disease     Past Surgical History:  Procedure Laterality Date  . CHOLECYSTECTOMY  June 2013  . COLONOSCOPY WITH PROPOFOL N/A 04/28/2019   Procedure: COLONOSCOPY WITH PROPOFOL;  Surgeon: Lin Landsman, MD;  Location: [place];  Service: Endoscopy;  Laterality: N/A;  priority 4  . HERNIA REPAIR Bilateral    inguinal  . REPAIR EXTENSOR TENDON Right 04/28/2020   Procedure: EXPLORATION OF RIGHT DORSAL HAND LACERATION WITH REPAIR OF INDEX EXTENSOR TENDON LACERATION;  Surgeon: Corky Mull, MD;  Location: [facility];  Service: Orthopedics;  Laterality: Right;  . UMBILICAL HERNIA REPAIR N/A 08/11/2019   Procedure: HERNIA REPAIR UMBILICAL ADULT;  Surgeon: Fredirick Maudlin, MD;  Location: [facility];  Service: General;  Laterality: N/A;  RNFA    There were no vitals filed for this visit.   Subjective Assessment - 06/17/20 1043    Subjective  Last night was hurting little more after I used that knuckle bender- but otherwise no problems    Pertinent History Pt had saw accident on 04/26/20 at [facility] and then Surgery by Dr Roland Rack on 04/28/20 -  Primary repair of right EIP and index EDC tendons.2. Irrigation and debridement with primary repair of right dorsal hand laceration (approximately 10 cm in length). Sutures were taken out on 05/10/20 and kept on dorsal splint - and then follow up again at [facility] 05/24/20 - kept in dorsal forearm splint and refer to OT/hand therapy - but with workers comp -referral to OT did not happend until 06/02/20 - pt now about 5 1/2 wks s/p    Patient Stated Goals Want to get the use of my R hand back to be able to work and take care of the animals and yard /garden at home    Currently in Pain? Yes    Pain Score 2     Pain Location Hand    Pain Orientation Right    Pain Descriptors / Indicators Aching;Tightness;Tender    Pain Type Acute pain;Surgical pain    Pain Onset More than a month ago    Pain Frequency Intermittent              [facility] OT Assessment - 06/17/20 0001      Right Hand AROM   R Index  MCP 0-90 70 Degrees    R Index PIP 0-100 90 Degrees    R Long  MCP 0-90 70 Degrees    R Long PIP 0-100 90 Degrees    R Ring  MCP 0-90 75 Degrees    R Ring PIP 0-100 95 Degrees    R Little  MCP 0-90 85 Degrees    R Little PIP 0-100 90 Degrees            cont to make progress in flexion of R digits - while maintaining extention         OT Treatments/Exercises (OP) - 06/17/20 0001      RUE Fluidotherapy   Number Minutes Fluidotherapy 8 Minutes    RUE Fluidotherapy Location Hand;Wrist    Comments AROM in all planes            Soft tissue mobs done to webspace and MC's of R hand -and joint - prior to ROM  desensitization done this date - massage  textures and tapping over dorsal hand - to do at home  Increase tension on  knuckle bender splint - time same  2 x 2 min - use 2 bands  radial side and 3 ulnar side- and followed by PROM in splint for PIP flexion 10 reps hold 5 sec - less than 1-2/10 pull  Followed by AROM to bar in palm - in splint  20 reps- keep pull under 2/10   Pt can start  weaning out of splints during the day- hour longer every day until next visit - monitor extention and sleep in dorsal block splint   After knuckle bender splint use 2x 2 min - with PROM and AROM in splint - To do Tendon glides -AROM - block - MC flexion , hook fist  Composite fist around 3 cm foam block - 10 reps  - place and hold extention inbetween all 10 reps -  Opposition -- alternate digits- 5 reps each  Thumb PA and RA AROM - 10 reps  3 x day   DOne and review AAROM for wrist flexion , ext over edge of table -10 reps - no pressure thru palm - reinforce with pt  Wrist AROM flexion and extention - open hand and loose fist afterwards 10reps Cont HEP 3 x day -pain or pull under 3/10         OT Education - 06/17/20 1029    Education Details progress and changes HEP    Person(s) Educated Patient    Methods Explanation;Demonstration;Tactile cues;Verbal cues;Handout    Comprehension Verbal cues required;Returned demonstration;Verbalized understanding            OT Short Term Goals - 06/04/20 1656      OT SHORT TERM GOAL #1   Title Pt to be independent in HEP and desentitization to tolerate scar massage and different textures    Baseline 4/10 at rest of scar - increase to 7/10  - very tender for scar massage and sensitive to soft and rougher textures - scar adhere - no hyper extention of MC 2nd and 3rd    Time 3    Period Weeks    Status New    Target Date 06/25/20      OT SHORT TERM GOAL #2   Title Pt to be indepenent with use of  MC block plint and dynamic splints to increase flexion in digits while maintaining extention less than -15 degree lag    Baseline MC's -20 2nd and 3rd - -20 2nd PIP , -10 3rd PIP - flexion MC's 35-60, PIP's 50-85 degrees    Time 3    Period Weeks    Status New    Target Date 06/25/20  OT Long Term Goals - 06/04/20 1701      OT LONG TERM GOAL #1   Title Pt R hand digits flexion increase for pt to touch palm to grip  ADLs objects with pain less than 3/10 and extentio lag less than -15    Baseline extention lag -20 at 2nd [facility] and PIP , -20 at PIP of 3rd - MC flexion MC 35-50 and PIP's 50-85 - pain increase 4-7/10    Time 5    Period Weeks    Status New    Target Date 07/09/20      OT LONG TERM GOAL #2   Title Grip strength increase to more than 50% compare to L to grip and pick up objects more than 5 lbs without symptoms    Baseline NT - 5 1/2 wks s/p - in splint all the time    Time 6    Period Weeks    Status New    Target Date 07/16/20      OT LONG TERM GOAL #3   Title Prehension strength increase to more than 50% compare to L to cut food, open packages, use in farm with animals    Baseline NT - 5 1/2 wks s/p - in splint 100% of time - pain 4-7/10    Time 6    Period Weeks    Status New    Target Date 07/16/20      OT LONG TERM GOAL #4   Title Extention of 2nd and 3rd to be less than -15 lag and able to push buttons and donn gloves    Baseline 5 1/2 wks s/p - extention lag -20 at 2nd [facility] and PIP , 3rd PIP - in splint all time time    Time 6    Period Weeks    Status New    Target Date 07/16/20      OT LONG TERM GOAL #5   Title Function on PRWHE improve more than 30 points    Baseline function score at eval 50/50 - in splint - no using hand in anything    Time 6    Period Weeks    Status New    Target Date 07/16/20                 Plan - 06/17/20 1030    Clinical Impression Statement Pt is 7 1/2 wks s/p Repair of R 2nd digit EIP and EDC - irrigation and debridement - pt show increase flexion in R hand whilie maintaining extention - cont to be tender over scar - pt to cont to work on desentitization and can use hour that splint is off in light activities - but less than 1 lbs and no sustained grip - or pressure- can start weaning hour day out of splints    OT Occupational Profile and History Problem Focused Assessment - Including review of records relating to presenting problem     Occupational performance deficits (Please refer to evaluation for details): ADL's;IADL's;Rest and Sleep;Work;Play;Leisure;Social Participation    Body Structure / Function / Physical Skills ADL;Edema;Dexterity;Decreased knowledge of use of DME;Decreased knowledge of precautions;Flexibility;ROM;UE functional use;Scar mobility;Pain;Strength;IADL;Coordination    Rehab Potential Good    Clinical Decision Making Limited treatment options, no task modification necessary    Comorbidities Affecting Occupational Performance: None    Modification or Assistance to Complete Evaluation  No modification of tasks or assist necessary to complete eval    OT Frequency 2x / week  OT Duration 6 weeks    OT Treatment/Interventions Self-care/ADL training;Contrast Bath;Fluidtherapy;Moist Heat;Paraffin;Therapeutic exercise;Ultrasound;DME and/or AE instruction;Manual Therapy;Passive range of motion;Scar mobilization;Splinting;Patient/family education    Consulted and Agree with Plan of Care Patient           Patient will benefit from skilled therapeutic intervention in order to improve the following deficits and impairments:   Body Structure / Function / Physical Skills: ADL,Edema,Dexterity,Decreased knowledge of use of DME,Decreased knowledge of precautions,Flexibility,ROM,UE functional use,Scar mobility,Pain,Strength,IADL,Coordination       Visit Diagnosis: Stiffness of right hand, not elsewhere classified  Scar condition and fibrosis of skin  Stiffness of right wrist, not elsewhere classified  Pain in right hand  Muscle weakness (generalized)    Problem List Patient Active Problem List   Diagnosis Date Noted  . Toothache 02/20/2020  . Headache 02/20/2020  . Smoking 02/20/2020  . Umbilical hernia, incarcerated   . Back pain 03/27/2019  . Benign hypertension 05/02/2018  . Obesity (BMI 30.0-34.9) 04/30/2018  . Healthcare maintenance 10/19/2016  . Flu vaccine need 04/20/2016  . GERD  (gastroesophageal reflux disease) 04/01/2015  . Gout 04/01/2015  . Mixed hyperlipidemia 10/01/2014  . Hypothyroidism 05/21/2014    Rosalyn Gess OTR/L,CLT 06/17/2020, 11:14 AM  [place] [facility] [address], [place], [address] Phone: [phone]   Fax:  [phone]  Name: Douglas Bird MRN: [id number] Date of Birth: 12/11/1965

## 2020-06-21 ENCOUNTER — Other Ambulatory Visit: Payer: Self-pay

## 2020-06-21 ENCOUNTER — Ambulatory Visit: Payer: PRIVATE HEALTH INSURANCE | Attending: Student | Admitting: Occupational Therapy

## 2020-06-21 DIAGNOSIS — M6281 Muscle weakness (generalized): Secondary | ICD-10-CM | POA: Insufficient documentation

## 2020-06-21 DIAGNOSIS — M79641 Pain in right hand: Secondary | ICD-10-CM | POA: Insufficient documentation

## 2020-06-21 DIAGNOSIS — L905 Scar conditions and fibrosis of skin: Secondary | ICD-10-CM | POA: Diagnosis present

## 2020-06-21 DIAGNOSIS — M25631 Stiffness of right wrist, not elsewhere classified: Secondary | ICD-10-CM | POA: Insufficient documentation

## 2020-06-21 DIAGNOSIS — M25641 Stiffness of right hand, not elsewhere classified: Secondary | ICD-10-CM | POA: Diagnosis present

## 2020-06-21 NOTE — Therapy (Signed)
[place] [facility] [address] [place], [address] Phone: [phone]   Fax:  [phone]  Occupational Therapy Treatment  Patient Details  Name: Douglas Bird MRN: [id number] Date of Birth: February 14, 1966 Referring Provider (OT): Dr Roland Rack   Encounter Date: 06/21/2020   OT End of Session - 06/21/20 0758    Visit Number 6    Number of Visits 12    Date for OT Re-Evaluation 07/16/20    OT Start Time 0731    OT Stop Time 0815    OT Time Calculation (min) 44 min    Activity Tolerance Patient tolerated treatment well    Behavior During Therapy [facility] for tasks assessed/performed           Past Medical History:  Diagnosis Date  . Diverticulitis 2011  . GERD (gastroesophageal reflux disease)   . Hyperlipidemia   . Hypertension    controlled on meds  . Hypothyroidism   . Thyroid disease     Past Surgical History:  Procedure Laterality Date  . CHOLECYSTECTOMY  June 2013  . COLONOSCOPY WITH PROPOFOL N/A 04/28/2019   Procedure: COLONOSCOPY WITH PROPOFOL;  Surgeon: Lin Landsman, MD;  Location: [place];  Service: Endoscopy;  Laterality: N/A;  priority 4  . HERNIA REPAIR Bilateral    inguinal  . REPAIR EXTENSOR TENDON Right 04/28/2020   Procedure: EXPLORATION OF RIGHT DORSAL HAND LACERATION WITH REPAIR OF INDEX EXTENSOR TENDON LACERATION;  Surgeon: Corky Mull, MD;  Location: [facility];  Service: Orthopedics;  Laterality: Right;  . UMBILICAL HERNIA REPAIR N/A 08/11/2019   Procedure: HERNIA REPAIR UMBILICAL ADULT;  Surgeon: Fredirick Maudlin, MD;  Location: [facility];  Service: General;  Laterality: N/A;  RNFA    There were no vitals filed for this visit.   Subjective Assessment - 06/21/20 0743    Subjective  About 4 hrs out of splint and using it in light activiites - but feels good- night time  hands hurts and feels numb    Pertinent History Pt had saw accident on 04/26/20 at [facility] and then Surgery by Dr  Roland Rack on 04/28/20 - Primary repair of right EIP and index EDC tendons.2. Irrigation and debridement with primary repair of right dorsal hand laceration (approximately 10 cm in length). Sutures were taken out on 05/10/20 and kept on dorsal splint - and then follow up again at [facility] 05/24/20 - kept in dorsal forearm splint and refer to OT/hand therapy - but with workers comp -referral to OT did not happend until 06/02/20 - pt now about 5 1/2 wks s/p    Patient Stated Goals Want to get the use of my R hand back to be able to work and take care of the animals and yard /garden at home    Currently in Pain? Yes    Pain Score 2     Pain Location Hand    Pain Orientation Right    Pain Descriptors / Indicators Aching;Tightness    Pain Type Acute pain;Surgical pain              [facility] OT Assessment - 06/21/20 0001      Right Hand AROM   R Ring  MCP 0-90 80 Degrees    R Little  MCP 0-90 90 Degrees          during session - after use of knuckle bender splint and flexion glove          OT Treatments/Exercises (  OP) - 06/21/20 0001      RUE Fluidotherapy   Number Minutes Fluidotherapy 10 Minutes    RUE Fluidotherapy Location Hand;Wrist    Comments AROM in all planes prior to soft tissue - and ROM            Soft tissue mobs done to webspace and MC's of R hand -and joint - prior to ROM  desensitization to cont at home with - massage  textures and tapping over dorsal hand  Same tension on  knuckle bender splint - time same  2 x 2 min - use 2 bands  radial side and 3 ulnar side-  And add this date flexion glove to 2nd and 3rd digit- to use in knuckle bender 2 x 2 min for composite flexion stretch  And then followed by AAROM and AROM in splint for PIP flexion 10 reps hold 5 sec - less than 1-2/10 pull    Pt to cont  weaning out of splints during the day- hour longer every day until next visit - this date at 5 hours out of splint   monitor extention and sleep in dorsal block splint (Review  with pt wearing and to loosen up acewrap  And wear isotoner glove night time   AAROM for wrist flexion , ext over edge of table -10 reps - no pressure thru palm - Review with pt again - and add this date light wrist extention table slides to HEP after PROM over edge of table  Wrist AROM flexion and extention - open hand and loose fistafterwards 10reps Cont HEP3x day -pain or pull under 2/10 And light functional use of R hand in ADL's          OT Education - 06/21/20 0758    Education Details progress and changes HEP    Person(s) Educated Patient    Methods Explanation;Demonstration;Tactile cues;Verbal cues;Handout    Comprehension Verbal cues required;Returned demonstration;Verbalized understanding            OT Short Term Goals - 06/04/20 1656      OT SHORT TERM GOAL #1   Title Pt to be independent in HEP and desentitization to tolerate scar massage and different textures    Baseline 4/10 at rest of scar - increase to 7/10  - very tender for scar massage and sensitive to soft and rougher textures - scar adhere - no hyper extention of MC 2nd and 3rd    Time 3    Period Weeks    Status New    Target Date 06/25/20      OT SHORT TERM GOAL #2   Title Pt to be indepenent with use of  MC block plint and dynamic splints to increase flexion in digits while maintaining extention less than -15 degree lag    Baseline MC's -20 2nd and 3rd - -20 2nd PIP , -10 3rd PIP - flexion MC's 35-60, PIP's 50-85 degrees    Time 3    Period Weeks    Status New    Target Date 06/25/20             OT Long Term Goals - 06/04/20 1701      OT LONG TERM GOAL #1   Title Pt R hand digits flexion increase for pt to touch palm to grip ADLs objects with pain less than 3/10 and extentio lag less than -15    Baseline extention lag -20 at 2nd [facility] and PIP , -20 at PIP of 3rd -  MC flexion MC 35-50 and PIP's 50-85 - pain increase 4-7/10    Time 5    Period Weeks    Status New    Target Date  07/09/20      OT LONG TERM GOAL #2   Title Grip strength increase to more than 50% compare to L to grip and pick up objects more than 5 lbs without symptoms    Baseline NT - 5 1/2 wks s/p - in splint all the time    Time 6    Period Weeks    Status New    Target Date 07/16/20      OT LONG TERM GOAL #3   Title Prehension strength increase to more than 50% compare to L to cut food, open packages, use in farm with animals    Baseline NT - 5 1/2 wks s/p - in splint 100% of time - pain 4-7/10    Time 6    Period Weeks    Status New    Target Date 07/16/20      OT LONG TERM GOAL #4   Title Extention of 2nd and 3rd to be less than -15 lag and able to push buttons and donn gloves    Baseline 5 1/2 wks s/p - extention lag -20 at 2nd [facility] and PIP , 3rd PIP - in splint all time time    Time 6    Period Weeks    Status New    Target Date 07/16/20      OT LONG TERM GOAL #5   Title Function on PRWHE improve more than 30 points    Baseline function score at eval 50/50 - in splint - no using hand in anything    Time 6    Period Weeks    Status New    Target Date 07/16/20                 Plan - 06/21/20 0759    Clinical Impression Statement Pt is about 8 wks s/p R repair of R 2nd digit EIP and EDC - irrigation and debridement- had to review with pt donning and wearing of night splint - not to tight wrap around hand -and cont to wean out of splint during ay - hour longer every day and use in light activities- add this date flexion glove to knuckle bender splint - to increase MC and composite flexion of 2nd and 3rd - keeping pain under 2/10    OT Occupational Profile and History Problem Focused Assessment - Including review of records relating to presenting problem    Occupational performance deficits (Please refer to evaluation for details): ADL's;IADL's;Rest and Sleep;Work;Play;Leisure;Social Participation    Body Structure / Function / Physical Skills ADL;Edema;Dexterity;Decreased  knowledge of use of DME;Decreased knowledge of precautions;Flexibility;ROM;UE functional use;Scar mobility;Pain;Strength;IADL;Coordination    Rehab Potential Good    Clinical Decision Making Limited treatment options, no task modification necessary    Comorbidities Affecting Occupational Performance: None    Modification or Assistance to Complete Evaluation  No modification of tasks or assist necessary to complete eval    OT Frequency 2x / week    OT Duration 6 weeks    OT Treatment/Interventions Self-care/ADL training;Contrast Bath;Fluidtherapy;Moist Heat;Paraffin;Therapeutic exercise;Ultrasound;DME and/or AE instruction;Manual Therapy;Passive range of motion;Scar mobilization;Splinting;Patient/family education    Consulted and Agree with Plan of Care Patient           Patient will benefit from skilled therapeutic intervention in order to improve the following deficits and  impairments:   Body Structure / Function / Physical Skills: ADL,Edema,Dexterity,Decreased knowledge of use of DME,Decreased knowledge of precautions,Flexibility,ROM,UE functional use,Scar mobility,Pain,Strength,IADL,Coordination       Visit Diagnosis: Stiffness of right hand, not elsewhere classified  Scar condition and fibrosis of skin  Stiffness of right wrist, not elsewhere classified  Pain in right hand  Muscle weakness (generalized)    Problem List Patient Active Problem List   Diagnosis Date Noted  . Toothache 02/20/2020  . Headache 02/20/2020  . Smoking 02/20/2020  . Umbilical hernia, incarcerated   . Back pain 03/27/2019  . Benign hypertension 05/02/2018  . Obesity (BMI 30.0-34.9) 04/30/2018  . Healthcare maintenance 10/19/2016  . Flu vaccine need 04/20/2016  . GERD (gastroesophageal reflux disease) 04/01/2015  . Gout 04/01/2015  . Mixed hyperlipidemia 10/01/2014  . Hypothyroidism 05/21/2014    Rosalyn Gess OTR/L,CLT 06/21/2020, 11:28 AM  [place]  [facility] [address] [place], [address] Phone: [phone]   Fax:  [phone]  Name: LEONELL LOBDELL MRN: [id number] Date of Birth: 1965-04-30

## 2020-06-22 ENCOUNTER — Telehealth: Payer: Self-pay | Admitting: Gerontology

## 2020-06-22 NOTE — Telephone Encounter (Signed)
Called pt but was unable to leave message

## 2020-06-23 DIAGNOSIS — M25641 Stiffness of right hand, not elsewhere classified: Secondary | ICD-10-CM | POA: Diagnosis present

## 2020-06-23 DIAGNOSIS — M25631 Stiffness of right wrist, not elsewhere classified: Secondary | ICD-10-CM | POA: Diagnosis present

## 2020-06-23 DIAGNOSIS — L905 Scar conditions and fibrosis of skin: Secondary | ICD-10-CM | POA: Diagnosis present

## 2020-06-23 DIAGNOSIS — M6281 Muscle weakness (generalized): Secondary | ICD-10-CM | POA: Diagnosis present

## 2020-06-23 DIAGNOSIS — M79641 Pain in right hand: Secondary | ICD-10-CM | POA: Diagnosis present

## 2020-06-24 ENCOUNTER — Other Ambulatory Visit: Payer: Self-pay

## 2020-06-24 ENCOUNTER — Ambulatory Visit: Payer: PRIVATE HEALTH INSURANCE | Admitting: Occupational Therapy

## 2020-06-24 DIAGNOSIS — M79641 Pain in right hand: Secondary | ICD-10-CM

## 2020-06-24 DIAGNOSIS — M25641 Stiffness of right hand, not elsewhere classified: Secondary | ICD-10-CM | POA: Diagnosis not present

## 2020-06-24 DIAGNOSIS — L905 Scar conditions and fibrosis of skin: Secondary | ICD-10-CM

## 2020-06-24 DIAGNOSIS — M6281 Muscle weakness (generalized): Secondary | ICD-10-CM

## 2020-06-24 DIAGNOSIS — M25631 Stiffness of right wrist, not elsewhere classified: Secondary | ICD-10-CM

## 2020-06-24 NOTE — Therapy (Signed)
[place] [facility] [address] [place], [address] Phone: [phone]   Fax:  [phone]  Occupational Therapy Treatment  Patient Details  Name: Douglas Bird MRN: [id number] Date of Birth: [birth year] Referring Provider (OT): Dr Roland Rack   Encounter Date: 06/24/2020   OT End of Session - 06/24/20 0855    Visit Number 7    Number of Visits 12    Date for OT Re-Evaluation 07/16/20    OT Start Time 0731    OT Stop Time 0811    OT Time Calculation (min) 40 min    Activity Tolerance Patient tolerated treatment well    Behavior During Therapy [facility] for tasks assessed/performed           Past Medical History:  Diagnosis Date  . Diverticulitis 2011  . GERD (gastroesophageal reflux disease)   . Hyperlipidemia   . Hypertension    controlled on meds  . Hypothyroidism   . Thyroid disease     Past Surgical History:  Procedure Laterality Date  . CHOLECYSTECTOMY  June 2013  . COLONOSCOPY WITH PROPOFOL N/A 04/28/2019   Procedure: COLONOSCOPY WITH PROPOFOL;  Surgeon: Lin Landsman, MD;  Location: [place];  Service: Endoscopy;  Laterality: N/A;  priority 4  . HERNIA REPAIR Bilateral    inguinal  . REPAIR EXTENSOR TENDON Right 04/28/2020   Procedure: EXPLORATION OF RIGHT DORSAL HAND LACERATION WITH REPAIR OF INDEX EXTENSOR TENDON LACERATION;  Surgeon: Corky Mull, MD;  Location: [facility];  Service: Orthopedics;  Laterality: Right;  . UMBILICAL HERNIA REPAIR N/A 08/11/2019   Procedure: HERNIA REPAIR UMBILICAL ADULT;  Surgeon: Fredirick Maudlin, MD;  Location: [facility];  Service: General;  Laterality: N/A;  RNFA    There were no vitals filed for this visit.   Subjective Assessment - 06/24/20 0742    Subjective  Keeping splint off about 5-6 hrs and using my hand more -and rubbing to get sensitivity better- but using it only in light activities    Pertinent History Pt had saw accident on 04/26/20 at [facility] and  then Surgery by Dr Roland Rack on 04/28/20 - Primary repair of right EIP and index EDC tendons.2. Irrigation and debridement with primary repair of right dorsal hand laceration (approximately 10 cm in length). Sutures were taken out on 05/10/20 and kept on dorsal splint - and then follow up again at [facility] 05/24/20 - kept in dorsal forearm splint and refer to OT/hand therapy - but with workers comp -referral to OT did not happend until 06/02/20 - pt now about 5 1/2 wks s/p    Patient Stated Goals Want to get the use of my R hand back to be able to work and take care of the animals and yard /garden at home    Currently in Pain? Yes    Pain Score 1               [facility] OT Assessment - 06/24/20 0001      Right Hand AROM   R Index  MCP 0-90 70 Degrees    R Index PIP 0-100 95 Degrees    R Long  MCP 0-90 75 Degrees    R Long PIP 0-100 95 Degrees    R Ring  MCP 0-90 80 Degrees    R Ring PIP 0-100 100 Degrees    R Little  MCP 0-90 90 Degrees    R Little PIP 0-100 90 Degrees  show progress in digits flexion- while maintain extention - as well as improve in hyper sensitivity over scar  And no issues with night splint           OT Treatments/Exercises (OP) - 06/24/20 0001      RUE Paraffin   Number Minutes Paraffin 8 Minutes    RUE Paraffin Location Hand    Comments composite flexion stretch ,and extention stretch - slide son table              Soft tissue mobs done to webspace and MC's of R hand -and joint - prior to ROM desensitization to cont at home with - massage textures and tapping over dorsal hand  Same tension onknuckle bender splint - time same 2 x 2 min - use2 bandsradial side and 3ulnar side-  And add this date flexion glove to 2nd and 3rd digit- to use in knuckle bender 2 x 2 min for composite flexion stretch  And then followed by AAROM and AROM in splint for PIP flexion 10 reps hold 5 sec- less than 1-2/10 pull    Pt to cont  weaning out of splint - and be  out of it during day in 2-3 days- and night time one will be discharge next week  Monitor extention for extention lag  AAROM for  Composite wrist/digits flexion And table slides for composite extention of wrist and digits 20 reps Add this date 1 lbs weight for wrist in all planes- 12-15 reps 2 x day increase over the next 5 days - to 2nd set and then 3rd set - pain free  12-15 reps Cont HEP3x day for digits stretches and ROM  -pain or pull under 2/10 And light functional use of R hand in ADL's /IADL's        OT Education - 06/24/20 0854    Education Details progress and changes HEP    Person(s) Educated Patient    Methods Explanation;Demonstration;Tactile cues;Verbal cues;Handout    Comprehension Verbal cues required;Returned demonstration;Verbalized understanding            OT Short Term Goals - 06/04/20 1656      OT SHORT TERM GOAL #1   Title Pt to be independent in HEP and desentitization to tolerate scar massage and different textures    Baseline 4/10 at rest of scar - increase to 7/10  - very tender for scar massage and sensitive to soft and rougher textures - scar adhere - no hyper extention of MC 2nd and 3rd    Time 3    Period Weeks    Status New    Target Date 06/25/20      OT SHORT TERM GOAL #2   Title Pt to be indepenent with use of  MC block plint and dynamic splints to increase flexion in digits while maintaining extention less than -15 degree lag    Baseline MC's -20 2nd and 3rd - -20 2nd PIP , -10 3rd PIP - flexion MC's 35-60, PIP's 50-85 degrees    Time 3    Period Weeks    Status New    Target Date 06/25/20             OT Long Term Goals - 06/04/20 1701      OT LONG TERM GOAL #1   Title Pt R hand digits flexion increase for pt to touch palm to grip ADLs objects with pain less than 3/10 and extentio lag less than -15    Baseline  extention lag -20 at 2nd [facility] and PIP , -20 at PIP of 3rd - MC flexion MC 35-50 and PIP's 50-85 - pain increase  4-7/10    Time 5    Period Weeks    Status New    Target Date 07/09/20      OT LONG TERM GOAL #2   Title Grip strength increase to more than 50% compare to L to grip and pick up objects more than 5 lbs without symptoms    Baseline NT - 5 1/2 wks s/p - in splint all the time    Time 6    Period Weeks    Status New    Target Date 07/16/20      OT LONG TERM GOAL #3   Title Prehension strength increase to more than 50% compare to L to cut food, open packages, use in farm with animals    Baseline NT - 5 1/2 wks s/p - in splint 100% of time - pain 4-7/10    Time 6    Period Weeks    Status New    Target Date 07/16/20      OT LONG TERM GOAL #4   Title Extention of 2nd and 3rd to be less than -15 lag and able to push buttons and donn gloves    Baseline 5 1/2 wks s/p - extention lag -20 at 2nd [facility] and PIP , 3rd PIP - in splint all time time    Time 6    Period Weeks    Status New    Target Date 07/16/20      OT LONG TERM GOAL #5   Title Function on PRWHE improve more than 30 points    Baseline function score at eval 50/50 - in splint - no using hand in anything    Time 6    Period Weeks    Status New    Target Date 07/16/20                 Plan - 06/24/20 0855    Clinical Impression Statement Pt is about 8 1/2  wks s/p R repair of R 2nd digit EIP and EDC - irrigation and debridement- pt doing welll maintaining extention - while out of splint now 6 hrs day - cont to show increase flexion in digits - and increase wrist flexion, extention - add strengthening for wrist this date -and increase tension on flexion glove  for 2nd and 3rd digit - to use in combination with knuckle bender splint - to increase MC and composite flexion of 2nd and 3rd - keeping pain under 2/10- and cont to increase use of R hand    OT Occupational Profile and History Problem Focused Assessment - Including review of records relating to presenting problem    Occupational performance deficits (Please refer to  evaluation for details): ADL's;IADL's;Rest and Sleep;Work;Play;Leisure;Social Participation    Body Structure / Function / Physical Skills ADL;Edema;Dexterity;Decreased knowledge of use of DME;Decreased knowledge of precautions;Flexibility;ROM;UE functional use;Scar mobility;Pain;Strength;IADL;Coordination    Rehab Potential Good    Clinical Decision Making Limited treatment options, no task modification necessary    Comorbidities Affecting Occupational Performance: None    Modification or Assistance to Complete Evaluation  No modification of tasks or assist necessary to complete eval    OT Frequency 2x / week    OT Duration 6 weeks    OT Treatment/Interventions Self-care/ADL training;Contrast Bath;Fluidtherapy;Moist Heat;Paraffin;Therapeutic exercise;Ultrasound;DME and/or AE instruction;Manual Therapy;Passive range of motion;Scar mobilization;Splinting;Patient/family education  Consulted and Agree with Plan of Care Patient           Patient will benefit from skilled therapeutic intervention in order to improve the following deficits and impairments:   Body Structure / Function / Physical Skills: ADL,Edema,Dexterity,Decreased knowledge of use of DME,Decreased knowledge of precautions,Flexibility,ROM,UE functional use,Scar mobility,Pain,Strength,IADL,Coordination       Visit Diagnosis: Stiffness of right hand, not elsewhere classified  Scar condition and fibrosis of skin  Stiffness of right wrist, not elsewhere classified  Pain in right hand  Muscle weakness (generalized)    Problem List Patient Active Problem List   Diagnosis Date Noted  . Toothache 02/20/2020  . Headache 02/20/2020  . Smoking 02/20/2020  . Umbilical hernia, incarcerated   . Back pain 03/27/2019  . Benign hypertension 05/02/2018  . Obesity (BMI 30.0-34.9) 04/30/2018  . Healthcare maintenance 10/19/2016  . Flu vaccine need 04/20/2016  . GERD (gastroesophageal reflux disease) 04/01/2015  . Gout  04/01/2015  . Mixed hyperlipidemia 10/01/2014  . Hypothyroidism 05/21/2014    Rosalyn Gess OTR/L,CLT 06/24/2020, 8:58 AM  [place] [facility] [address], [place], [address] Phone: [phone]   Fax:  [phone]  Name: Douglas Bird MRN: [id number] Date of Birth: 09-30-1965

## 2020-06-29 ENCOUNTER — Other Ambulatory Visit: Payer: Self-pay

## 2020-06-29 ENCOUNTER — Ambulatory Visit: Payer: PRIVATE HEALTH INSURANCE | Admitting: Occupational Therapy

## 2020-06-29 DIAGNOSIS — L905 Scar conditions and fibrosis of skin: Secondary | ICD-10-CM

## 2020-06-29 DIAGNOSIS — M25641 Stiffness of right hand, not elsewhere classified: Secondary | ICD-10-CM

## 2020-06-29 DIAGNOSIS — M25631 Stiffness of right wrist, not elsewhere classified: Secondary | ICD-10-CM

## 2020-06-29 DIAGNOSIS — M6281 Muscle weakness (generalized): Secondary | ICD-10-CM

## 2020-06-29 DIAGNOSIS — M79641 Pain in right hand: Secondary | ICD-10-CM

## 2020-06-29 NOTE — Therapy (Signed)
[facility] [address] [place], [address] Phone: [phone]   Fax:  [phone]  Occupational Therapy Treatment  Patient Details  Name: Douglas Bird MRN: [id number] Date of Birth: 25-Jan-1966 Referring Provider (OT): Dr Roland Rack   Encounter Date: 06/29/2020   OT End of Session - 06/29/20 1102    Visit Number 8    Number of Visits 12    Date for OT Re-Evaluation 07/16/20    OT Start Time 6283    OT Stop Time 1118    OT Time Calculation (min) 43 min    Activity Tolerance Patient tolerated treatment well    Behavior During Therapy [facility] for tasks assessed/performed           Past Medical History:  Diagnosis Date  . Diverticulitis 2011  . GERD (gastroesophageal reflux disease)   . Hyperlipidemia   . Hypertension    controlled on meds  . Hypothyroidism   . Thyroid disease     Past Surgical History:  Procedure Laterality Date  . CHOLECYSTECTOMY  June 2013  . COLONOSCOPY WITH PROPOFOL N/A 04/28/2019   Procedure: COLONOSCOPY WITH PROPOFOL;  Surgeon: Lin Landsman, MD;  Location: [place];  Service: Endoscopy;  Laterality: N/A;  priority 4  . HERNIA REPAIR Bilateral    inguinal  . REPAIR EXTENSOR TENDON Right 04/28/2020   Procedure: EXPLORATION OF RIGHT DORSAL HAND LACERATION WITH REPAIR OF INDEX EXTENSOR TENDON LACERATION;  Surgeon: Corky Mull, MD;  Location: [facility];  Service: Orthopedics;  Laterality: Right;  . UMBILICAL HERNIA REPAIR N/A 08/11/2019   Procedure: HERNIA REPAIR UMBILICAL ADULT;  Surgeon: Fredirick Maudlin, MD;  Location: [facility];  Service: General;  Laterality: N/A;  RNFA    There were no vitals filed for this visit.   Subjective Assessment - 06/29/20 1100    Subjective  Using it more- soreness still over that knuckle -with little shock when hitting it- exercises did okay -and most all the day out of splint now    Pertinent History Pt had saw accident on 04/26/20 at  [facility] and then Surgery by Dr Roland Rack on 04/28/20 - Primary repair of right EIP and index EDC tendons.2. Irrigation and debridement with primary repair of right dorsal hand laceration (approximately 10 cm in length). Sutures were taken out on 05/10/20 and kept on dorsal splint - and then follow up again at [facility] 05/24/20 - kept in dorsal forearm splint and refer to OT/hand therapy - but with workers comp -referral to OT did not happend until 06/02/20 - pt now about 5 1/2 wks s/p    Patient Stated Goals Want to get the use of my R hand back to be able to work and take care of the animals and yard /garden at home    Currently in Pain? Yes    Pain Score 2     Pain Location Hand    Pain Orientation Right    Pain Descriptors / Indicators Aching;Tightness    Pain Type Acute pain;Surgical pain    Pain Onset More than a month ago    Pain Frequency Intermittent              [facility] OT Assessment - 06/29/20 0001      AROM   Right Wrist Extension 62 Degrees    Right Wrist Flexion 90 Degrees      Strength   Right Hand Grip (lbs) 25    Right Hand Lateral Pinch  18 lbs    Right Hand 3 Point Pinch 12 lbs    Left Hand Grip (lbs) 78    Left Hand Lateral Pinch 25 lbs    Left Hand 3 Point Pinch 14 lbs      Right Hand AROM   R Index  MCP 0-90 75 Degrees    R Index PIP 0-100 90 Degrees    R Long  MCP 0-90 70 Degrees    R Long PIP 0-100 90 Degrees    R Ring  MCP 0-90 80 Degrees    R Ring PIP 0-100 100 Degrees    R Little  MCP 0-90 90 Degrees    R Little PIP 0-100 90 Degrees           Positive Tinel over 2nd MC- and soreness over dorsal 2nd and 3rd MC and hand - with some edema  Report increase use out of splint during day  Pt to stop wearing splint night time this date  Can cont isotoner glove as needed          OT Treatments/Exercises (OP) - 06/29/20 0001      RUE Fluidotherapy   Number Minutes Fluidotherapy 10 Minutes    RUE Fluidotherapy Location Hand;Wrist    Comments AROM for wrist ,  digits- decrease stiffness, soreness- 2 rotation of ice inbetween              Soft tissue mobs done to webspace and MC's of R hand -and joint - prior to ROM desensitizationto cont at home with- massage textures and tapping over dorsal hand  {t to cont with knuckle bender splint - time same 2 x 2 min - use2 bandsradial side and 3ulnar side- And  flexion glove to 2nd and 3rd digit- to use in knuckle bender 2 x 2 min for composite flexion stretch  And thenfollowed byAAROM and AROMin splint for PIP flexion 10 reps hold 5 sec- less than 1-2/10 pull  Add this date light putty gripping only - pain free and use all digits  2 x day 12 reps pain free or pain under 1-2/10 And then rubber band for 2nd digit extention for MC and PIP - 12 reps 2 x day  Pain free   Cont to Monitor  for extention lag  AAROM for  Composite wrist/digits flexion And table slides for composite extention of wrist and digits 20 reps- review for pt to keep pain free Upgrade pt this date to 3 lbs for wrist in all planes- 12  Reps -  - pain free- but wrist extention 1 lbs only  2 x day  Had no issues in session   Cont HEP3x day for digits stretches and ROM  -pain or pull under2/10 And light functional use of R hand in ADL's/IADL's         OT Education - 06/29/20 1102    Education Details progress and changes HEP    Person(s) Educated Patient    Methods Explanation;Demonstration;Tactile cues;Verbal cues;Handout    Comprehension Verbal cues required;Returned demonstration;Verbalized understanding            OT Short Term Goals - 06/04/20 1656      OT SHORT TERM GOAL #1   Title Pt to be independent in HEP and desentitization to tolerate scar massage and different textures    Baseline 4/10 at rest of scar - increase to 7/10  - very tender for scar massage and sensitive to soft and rougher textures - scar adhere -  no hyper extention of MC 2nd and 3rd    Time 3    Period Weeks     Status New    Target Date 06/25/20      OT SHORT TERM GOAL #2   Title Pt to be indepenent with use of  MC block plint and dynamic splints to increase flexion in digits while maintaining extention less than -15 degree lag    Baseline MC's -20 2nd and 3rd - -20 2nd PIP , -10 3rd PIP - flexion MC's 35-60, PIP's 50-85 degrees    Time 3    Period Weeks    Status New    Target Date 06/25/20             OT Long Term Goals - 06/04/20 1701      OT LONG TERM GOAL #1   Title Pt R hand digits flexion increase for pt to touch palm to grip ADLs objects with pain less than 3/10 and extentio lag less than -15    Baseline extention lag -20 at 2nd [facility] and PIP , -20 at PIP of 3rd - MC flexion MC 35-50 and PIP's 50-85 - pain increase 4-7/10    Time 5    Period Weeks    Status New    Target Date 07/09/20      OT LONG TERM GOAL #2   Title Grip strength increase to more than 50% compare to L to grip and pick up objects more than 5 lbs without symptoms    Baseline NT - 5 1/2 wks s/p - in splint all the time    Time 6    Period Weeks    Status New    Target Date 07/16/20      OT LONG TERM GOAL #3   Title Prehension strength increase to more than 50% compare to L to cut food, open packages, use in farm with animals    Baseline NT - 5 1/2 wks s/p - in splint 100% of time - pain 4-7/10    Time 6    Period Weeks    Status New    Target Date 07/16/20      OT LONG TERM GOAL #4   Title Extention of 2nd and 3rd to be less than -15 lag and able to push buttons and donn gloves    Baseline 5 1/2 wks s/p - extention lag -20 at 2nd [facility] and PIP , 3rd PIP - in splint all time time    Time 6    Period Weeks    Status New    Target Date 07/16/20      OT LONG TERM GOAL #5   Title Function on PRWHE improve more than 30 points    Baseline function score at eval 50/50 - in splint - no using hand in anything    Time 6    Period Weeks    Status New    Target Date 07/16/20                 Plan -  06/29/20 1102    Clinical Impression Statement Pt is about 9 1/2  wks s/p R repair of R 2nd digit EIP and EDC - irrigation and debridement- pt doing welll maintaining extention - while out of splint now whole day- pt to stop wearing this week night time splint  - cont to show increase flexion in digits - and increase wrist flexion, extention -  upgrade this date to putty  light blue for grip- but pain free- and upgrade to 3 lbs weight for wrist and elbow- but not static grip -and use 1 lbs for wrist extention and flexion- and only 2 x day for 12 reps- - rubber band add for 2nd digit MC and PIP extention - 2 x day 12 reps - pain freee- and can use hand little more but less than 1-2 lbs and pain free    OT Occupational Profile and History Problem Focused Assessment - Including review of records relating to presenting problem    Occupational performance deficits (Please refer to evaluation for details): ADL's;IADL's;Rest and Sleep;Work;Play;Leisure;Social Participation    Body Structure / Function / Physical Skills ADL;Edema;Dexterity;Decreased knowledge of use of DME;Decreased knowledge of precautions;Flexibility;ROM;UE functional use;Scar mobility;Pain;Strength;IADL;Coordination    Rehab Potential Good    Clinical Decision Making Limited treatment options, no task modification necessary    Comorbidities Affecting Occupational Performance: None    Modification or Assistance to Complete Evaluation  No modification of tasks or assist necessary to complete eval    OT Frequency 2x / week    OT Duration 6 weeks    OT Treatment/Interventions Self-care/ADL training;Contrast Bath;Fluidtherapy;Moist Heat;Paraffin;Therapeutic exercise;Ultrasound;DME and/or AE instruction;Manual Therapy;Passive range of motion;Scar mobilization;Splinting;Patient/family education    Consulted and Agree with Plan of Care Patient           Patient will benefit from skilled therapeutic intervention in order to improve the following  deficits and impairments:   Body Structure / Function / Physical Skills: ADL,Edema,Dexterity,Decreased knowledge of use of DME,Decreased knowledge of precautions,Flexibility,ROM,UE functional use,Scar mobility,Pain,Strength,IADL,Coordination       Visit Diagnosis: Stiffness of right hand, not elsewhere classified  Scar condition and fibrosis of skin  Stiffness of right wrist, not elsewhere classified  Pain in right hand  Muscle weakness (generalized)    Problem List Patient Active Problem List   Diagnosis Date Noted  . Toothache 02/20/2020  . Headache 02/20/2020  . Smoking 02/20/2020  . Umbilical hernia, incarcerated   . Back pain 03/27/2019  . Benign hypertension 05/02/2018  . Obesity (BMI 30.0-34.9) 04/30/2018  . Healthcare maintenance 10/19/2016  . Flu vaccine need 04/20/2016  . GERD (gastroesophageal reflux disease) 04/01/2015  . Gout 04/01/2015  . Mixed hyperlipidemia 10/01/2014  . Hypothyroidism 05/21/2014    Rosalyn Gess OTR/L,CLT 06/29/2020, 12:02 PM  [facility] [address] [place], [address] Phone: [phone]   Fax:  [phone]  Name: Douglas Bird MRN: [id number] Date of Birth: 16-Jul-1965

## 2020-07-01 ENCOUNTER — Ambulatory Visit: Payer: Self-pay | Admitting: Gerontology

## 2020-07-01 ENCOUNTER — Other Ambulatory Visit: Payer: Self-pay

## 2020-07-01 ENCOUNTER — Ambulatory Visit: Payer: PRIVATE HEALTH INSURANCE | Admitting: Occupational Therapy

## 2020-07-01 VITALS — BP 136/94 | HR 84 | Ht 68.0 in | Wt 217.3 lb

## 2020-07-01 DIAGNOSIS — L905 Scar conditions and fibrosis of skin: Secondary | ICD-10-CM

## 2020-07-01 DIAGNOSIS — M79641 Pain in right hand: Secondary | ICD-10-CM

## 2020-07-01 DIAGNOSIS — K219 Gastro-esophageal reflux disease without esophagitis: Secondary | ICD-10-CM

## 2020-07-01 DIAGNOSIS — M25641 Stiffness of right hand, not elsewhere classified: Secondary | ICD-10-CM | POA: Diagnosis not present

## 2020-07-01 DIAGNOSIS — M25631 Stiffness of right wrist, not elsewhere classified: Secondary | ICD-10-CM

## 2020-07-01 DIAGNOSIS — M6281 Muscle weakness (generalized): Secondary | ICD-10-CM

## 2020-07-01 DIAGNOSIS — I10 Essential (primary) hypertension: Secondary | ICD-10-CM

## 2020-07-01 MED ORDER — LISINOPRIL 20 MG PO TABS
ORAL_TABLET | Freq: Every day | ORAL | 1 refills | Status: DC
  Filled 2020-07-01: qty 90, 90d supply, fill #0
  Filled 2020-12-01: qty 90, 90d supply, fill #1

## 2020-07-01 MED ORDER — HYDROCHLOROTHIAZIDE 12.5 MG PO CAPS
ORAL_CAPSULE | ORAL | 1 refills | Status: DC
  Filled 2020-07-01: qty 90, 90d supply, fill #0
  Filled 2020-10-04: qty 90, 90d supply, fill #1

## 2020-07-01 MED ORDER — OMEPRAZOLE 20 MG PO CPDR
DELAYED_RELEASE_CAPSULE | Freq: Every day | ORAL | 1 refills | Status: DC
  Filled 2020-07-01: qty 90, 90d supply, fill #0
  Filled 2020-11-17: qty 90, 90d supply, fill #1

## 2020-07-01 NOTE — Patient Instructions (Signed)

## 2020-07-01 NOTE — Therapy (Signed)
[facility] [address] [place], [address] Phone: [phone]   Fax:  [phone]  Occupational Therapy Treatment  Patient Details  Name: Douglas Bird MRN: [id number] Date of Birth: 07/07/1965 Referring Provider (OT): Dr Roland Rack   Encounter Date: 07/01/2020   OT End of Session - 07/01/20 1049    Visit Number 9    Number of Visits 12    Date for OT Re-Evaluation 07/16/20    OT Start Time 1034    OT Stop Time 1115    OT Time Calculation (min) 41 min    Activity Tolerance Patient tolerated treatment well    Behavior During Therapy [facility] for tasks assessed/performed           Past Medical History:  Diagnosis Date  . Diverticulitis 2011  . GERD (gastroesophageal reflux disease)   . Hyperlipidemia   . Hypertension    controlled on meds  . Hypothyroidism   . Thyroid disease     Past Surgical History:  Procedure Laterality Date  . CHOLECYSTECTOMY  June 2013  . COLONOSCOPY WITH PROPOFOL N/A 04/28/2019   Procedure: COLONOSCOPY WITH PROPOFOL;  Surgeon: Lin Landsman, MD;  Location: [place];  Service: Endoscopy;  Laterality: N/A;  priority 4  . HERNIA REPAIR Bilateral    inguinal  . REPAIR EXTENSOR TENDON Right 04/28/2020   Procedure: EXPLORATION OF RIGHT DORSAL HAND LACERATION WITH REPAIR OF INDEX EXTENSOR TENDON LACERATION;  Surgeon: Corky Mull, MD;  Location: [facility];  Service: Orthopedics;  Laterality: Right;  . UMBILICAL HERNIA REPAIR N/A 08/11/2019   Procedure: HERNIA REPAIR UMBILICAL ADULT;  Surgeon: Fredirick Maudlin, MD;  Location: [facility];  Service: General;  Laterality: N/A;  RNFA    There were no vitals filed for this visit.   Subjective Assessment - 07/01/20 1044    Subjective  Pain is good- about 1/10 and did not sleep with the splint on - putty did good -and done 3 lbs except for wrist up and down - using it more too -    Pertinent History Pt had saw accident on 04/26/20  at [facility] and then Surgery by Dr Roland Rack on 04/28/20 - Primary repair of right EIP and index EDC tendons.2. Irrigation and debridement with primary repair of right dorsal hand laceration (approximately 10 cm in length). Sutures were taken out on 05/10/20 and kept on dorsal splint - and then follow up again at [facility] 05/24/20 - kept in dorsal forearm splint and refer to OT/hand therapy - but with workers comp -referral to OT did not happend until 06/02/20 - pt now about 5 1/2 wks s/p    Patient Stated Goals Want to get the use of my R hand back to be able to work and take care of the animals and yard /garden at home    Currently in Pain? Yes    Pain Location Hand    Pain Orientation Right    Pain Descriptors / Indicators Aching;Tightness    Pain Type Acute pain;Surgical pain              [facility] OT Assessment - 07/01/20 0001      Strength   Right Hand Grip (lbs) 36    Right Hand Lateral Pinch 19 lbs    Right Hand 3 Point Pinch 13 lbs    Left Hand Grip (lbs) 78    Left Hand Lateral Pinch 25 lbs    Left Hand 3 Point Pinch  14 lbs      Right Hand AROM   R Index  MCP 0-90 75 Degrees    R Index PIP 0-100 90 Degrees    R Long  MCP 0-90 75 Degrees    R Long PIP 0-100 90 Degrees    R Ring  MCP 0-90 80 Degrees    R Ring PIP 0-100 100 Degrees    R Little  MCP 0-90 90 Degrees    R Little PIP 0-100 90 Degrees             Pt arrive with decrease pain and stiffness this am - decrease edema  out of splint day and night time Increase use at home - with light activities    Can cont isotoner glove as needed                 OT Treatments/Exercises (OP) - 07/01/20 0001      RUE Fluidotherapy   Number Minutes Fluidotherapy 10 Minutes    RUE Fluidotherapy Location Hand;Wrist    Comments AROM wrist /hand with ice 2 x 1 min         Coban wrap done = flexion wrap to 2nd and 3rd digit- prior to AROM , place and hold and putty review    Soft tissue mobs done to webspace and MC's of R  hand -and joint - prior to ROM   Pt to cont with knuckle bender splint - time same 2 x 2 min - use2 bandsradial side and 3ulnar side- And  flexion glove to 2nd and 3rd digit- to use in knuckle bender 2 x 2 min for composite flexion stretch  See if need to adjust next session   And thenfollowed byAAROM and AROMin splint for PIP flexion 10 reps hold 5 sec- less than 1-2/10 pull  Light putty gripping  - pain free and use all digits  2 x day 2 sets of 15 reps pain free or pain under 1-2/10 Add lat and 3 point grip- 12 reps - 2 sets  Can increase over weekend to 3rd set if pain free And then rubber band for 2nd digit extention for MC and PIP - 12 reps 2 x day  Pain free   Cont to Monitor  for extention lag  AAROM forCompositewrist/digitsflexion And table slides for composite extention of wrist and digits 20 reps- review for pt to keep pain free Cont with  3 lbs for wrist in all planes- 12  Reps -2 sets  -  - pain free- but wrist extention 1 lbs only 2-3 sets  2 x day  Had no issues in session  Pt doing some elbow and shoulder with 3 lbs   Cont HEP3x dayfor digits stretches and ROM-pain or pull under2/10 And light functional use of R hand in ADL's/IADL'saround 2 lbs to 3 lbs pain free        OT Education - 07/01/20 1049    Education Details progress and changes HEP    Person(s) Educated Patient    Methods Explanation;Demonstration;Tactile cues;Verbal cues;Handout    Comprehension Verbal cues required;Returned demonstration;Verbalized understanding            OT Short Term Goals - 06/04/20 1656      OT SHORT TERM GOAL #1   Title Pt to be independent in HEP and desentitization to tolerate scar massage and different textures    Baseline 4/10 at rest of scar - increase to 7/10  - very tender for scar massage  and sensitive to soft and rougher textures - scar adhere - no hyper extention of MC 2nd and 3rd    Time 3    Period Weeks    Status New     Target Date 06/25/20      OT SHORT TERM GOAL #2   Title Pt to be indepenent with use of  MC block plint and dynamic splints to increase flexion in digits while maintaining extention less than -15 degree lag    Baseline MC's -20 2nd and 3rd - -20 2nd PIP , -10 3rd PIP - flexion MC's 35-60, PIP's 50-85 degrees    Time 3    Period Weeks    Status New    Target Date 06/25/20             OT Long Term Goals - 06/04/20 1701      OT LONG TERM GOAL #1   Title Pt R hand digits flexion increase for pt to touch palm to grip ADLs objects with pain less than 3/10 and extentio lag less than -15    Baseline extention lag -20 at 2nd [facility] and PIP , -20 at PIP of 3rd - MC flexion MC 35-50 and PIP's 50-85 - pain increase 4-7/10    Time 5    Period Weeks    Status New    Target Date 07/09/20      OT LONG TERM GOAL #2   Title Grip strength increase to more than 50% compare to L to grip and pick up objects more than 5 lbs without symptoms    Baseline NT - 5 1/2 wks s/p - in splint all the time    Time 6    Period Weeks    Status New    Target Date 07/16/20      OT LONG TERM GOAL #3   Title Prehension strength increase to more than 50% compare to L to cut food, open packages, use in farm with animals    Baseline NT - 5 1/2 wks s/p - in splint 100% of time - pain 4-7/10    Time 6    Period Weeks    Status New    Target Date 07/16/20      OT LONG TERM GOAL #4   Title Extention of 2nd and 3rd to be less than -15 lag and able to push buttons and donn gloves    Baseline 5 1/2 wks s/p - extention lag -20 at 2nd [facility] and PIP , 3rd PIP - in splint all time time    Time 6    Period Weeks    Status New    Target Date 07/16/20      OT LONG TERM GOAL #5   Title Function on PRWHE improve more than 30 points    Baseline function score at eval 50/50 - in splint - no using hand in anything    Time 6    Period Weeks    Status New    Target Date 07/16/20                 Plan - 07/01/20 1119     Clinical Impression Statement Pt is about 10  wks s/p R repair of R 2nd digit EIP and EDC - irrigation and debridement- pt doing welll maintaining extention - while out of splint now day and night time - decrease edema and pain - even with upgrade of HEP last time to light putty and 3 lbs  for wrist and UE - pt cont to show increase flexion in digits - and increase wrist flexion, extention -  kept putty at light blue for grip-  pain free-  and add lat and 3 point -tolerate 3 lbs weight for wrist and elbow- but use 1 lbs for wrist extention and flexion- and only 2 x day for 2 x  12 reps- - rubber band add for 2nd digit MC and PIP extention last time  - 2 x day 12 reps - pain free- and can use hand little more in activiites around 2 lbs and pain free    OT Occupational Profile and History Problem Focused Assessment - Including review of records relating to presenting problem    Occupational performance deficits (Please refer to evaluation for details): ADL's;IADL's;Rest and Sleep;Work;Play;Leisure;Social Participation    Body Structure / Function / Physical Skills ADL;Edema;Dexterity;Decreased knowledge of use of DME;Decreased knowledge of precautions;Flexibility;ROM;UE functional use;Scar mobility;Pain;Strength;IADL;Coordination    Rehab Potential Good    Clinical Decision Making Limited treatment options, no task modification necessary    Comorbidities Affecting Occupational Performance: None    Modification or Assistance to Complete Evaluation  No modification of tasks or assist necessary to complete eval    OT Frequency 2x / week    OT Duration 6 weeks    OT Treatment/Interventions Self-care/ADL training;Contrast Bath;Fluidtherapy;Moist Heat;Paraffin;Therapeutic exercise;Ultrasound;DME and/or AE instruction;Manual Therapy;Passive range of motion;Scar mobilization;Splinting;Patient/family education    Consulted and Agree with Plan of Care Patient           Patient will benefit from skilled  therapeutic intervention in order to improve the following deficits and impairments:   Body Structure / Function / Physical Skills: ADL,Edema,Dexterity,Decreased knowledge of use of DME,Decreased knowledge of precautions,Flexibility,ROM,UE functional use,Scar mobility,Pain,Strength,IADL,Coordination       Visit Diagnosis: Stiffness of right hand, not elsewhere classified  Scar condition and fibrosis of skin  Stiffness of right wrist, not elsewhere classified  Pain in right hand  Muscle weakness (generalized)    Problem List Patient Active Problem List   Diagnosis Date Noted  . Toothache 02/20/2020  . Headache 02/20/2020  . Smoking 02/20/2020  . Umbilical hernia, incarcerated   . Back pain 03/27/2019  . Benign hypertension 05/02/2018  . Obesity (BMI 30.0-34.9) 04/30/2018  . Healthcare maintenance 10/19/2016  . Flu vaccine need 04/20/2016  . GERD (gastroesophageal reflux disease) 04/01/2015  . Gout 04/01/2015  . Mixed hyperlipidemia 10/01/2014  . Hypothyroidism 05/21/2014    Rosalyn Gess OTR/L,CLT 07/01/2020, 11:23 AM  [facility] [address], [place], [address] Phone: [phone]   Fax:  [phone]  Name: FABIANO GINLEY MRN: [id number] Date of Birth: [birth year]

## 2020-07-01 NOTE — Progress Notes (Signed)
[facility]   Progress Note: General Provider: Wolfgang Phoenix, NP  SUBJECTIVE:   Douglas Bird is a 55 y.o. male who  has a past medical history of Diverticulitis (2011), GERD (gastroesophageal reflux disease), Hyperlipidemia, Hypertension, Hypothyroidism, and Thyroid disease. Patient presents today for hypertension follow up. He states that he has been checking his blood pressure at home once a week using his friend's manual blood pressure monitor; however, he is unsure about the blood pressure readings. He also reports that he didn't take lisinopril in the past four months because he assumed lisinopril was discontinued though he took his lisinopril before leaving the clinic. Blood pressure elevated at this visit: 152/90, and repeat blood pressure was 136/94. He denies chest pain, headache, or shortness of breath. He states that he had a sawblade accident at work on 04/26/20 to the dorsal aspect of the right hand. On 04/28/20, Dr. Roland Bird did a surgical procedure for a right dorsal hand laceration with a laceration of the extensor tendon to the right index finger. His right-hand wound healed entirely, and he sees an occupational therapist twice a week for hand strength. He denies numbness or tingling in the right hand and endorses right hand strength is improving.  He brought covid 19 vaccination card and had [place] on 04/06/20 and 05/13/20; he is due for a booster dose after July 24th, 2022. Overall he is doing well and reports no further complaints. Review of Systems  Constitutional: Negative.   HENT: Negative.   Eyes: Negative.   Respiratory: Negative.   Cardiovascular: Negative.   Gastrointestinal: Negative.   Genitourinary: Negative.   Musculoskeletal: Negative.   Skin: Negative.   Neurological: Negative.   Endo/Heme/Allergies: Negative.   Psychiatric/Behavioral: Negative.      OBJECTIVE: BP (!) 152/90 (BP Location: Left Arm)   Pulse 84   Ht 5\' 8"  (1.727 m)   Wt  217 lb 4.8 oz (98.6 kg)   SpO2 95%   BMI 33.04 kg/m   Wt Readings from Last 3 Encounters:  07/01/20 217 lb 4.8 oz (98.6 kg)  04/28/20 210 lb (95.3 kg)  04/26/20 210 lb (95.3 kg)     Physical Exam Constitutional:      Appearance: Normal appearance.  HENT:     Head: Normocephalic.  Cardiovascular:     Rate and Rhythm: Normal rate and regular rhythm.     Pulses: Normal pulses.     Heart sounds: Normal heart sounds.  Pulmonary:     Effort: Pulmonary effort is normal.     Breath sounds: Normal breath sounds.  Abdominal:     General: Abdomen is flat. Bowel sounds are normal.  Musculoskeletal:        General: Normal range of motion.     Cervical back: Normal range of motion and neck supple.  Skin:    General: Skin is warm and dry.  Neurological:     General: No focal deficit present.     Mental Status: He is alert and oriented to person, place, and time.  Psychiatric:        Mood and Affect: Mood normal.        Behavior: Behavior normal.        Thought Content: Thought content normal.        Judgment: Judgment normal.     ASSESSMENT/PLAN:  1. Chronic GERD No change in treatment. - omeprazole (PRILOSEC) 20 MG capsule; TAKE ONE CAPSULE BY MOUTH EVERY DAY  Dispense: 90 capsule; Refill: 1  2. Benign hypertension  -Blood pressure machine provided and instructed the use of the wrist cuff blood pressure monitor. -Advised to check blood pressure 3 x a week and to bring log with next appointment. -Continue DASH diet and daily exercise as tolerated. -Advise to take all medications including lisinopril daily as we discussed. - hydrochlorothiazide (MICROZIDE) 12.5 MG capsule; TAKE ONE CAPSULE (12.5 MG) BY MOUTH EVERY DAY  Dispense: 90 capsule; Refill: 1 - lisinopril (ZESTRIL) 20 MG tablet; TAKE ONE TABLET BY MOUTH EVERY DAY  Dispense: 90 tablet; Refill: 1   Return in about 3 months (around 09/30/2020), or if symptoms worsen or fail to improve.    The patient was given clear  instructions to go to ER or return to medical center if symptoms do not improve, worsen or new problems develop. The patient verbalized understanding and agreed with plan of care.  Douglas Bird, [place]

## 2020-07-02 ENCOUNTER — Other Ambulatory Visit: Payer: Self-pay

## 2020-07-06 ENCOUNTER — Other Ambulatory Visit: Payer: Self-pay

## 2020-07-06 ENCOUNTER — Ambulatory Visit: Payer: PRIVATE HEALTH INSURANCE | Admitting: Occupational Therapy

## 2020-07-06 DIAGNOSIS — M25641 Stiffness of right hand, not elsewhere classified: Secondary | ICD-10-CM | POA: Diagnosis not present

## 2020-07-06 DIAGNOSIS — M79641 Pain in right hand: Secondary | ICD-10-CM

## 2020-07-06 DIAGNOSIS — L905 Scar conditions and fibrosis of skin: Secondary | ICD-10-CM

## 2020-07-06 DIAGNOSIS — M6281 Muscle weakness (generalized): Secondary | ICD-10-CM

## 2020-07-06 DIAGNOSIS — M25631 Stiffness of right wrist, not elsewhere classified: Secondary | ICD-10-CM

## 2020-07-07 ENCOUNTER — Other Ambulatory Visit: Payer: Self-pay

## 2020-07-07 MED FILL — Sildenafil Citrate Tab 100 MG: ORAL | 30 days supply | Qty: 10 | Fill #0 | Status: AC

## 2020-07-07 MED FILL — Levothyroxine Sodium Tab 150 MCG: ORAL | 90 days supply | Qty: 90 | Fill #0 | Status: AC

## 2020-07-08 ENCOUNTER — Ambulatory Visit: Payer: PRIVATE HEALTH INSURANCE | Admitting: Occupational Therapy

## 2020-07-08 ENCOUNTER — Other Ambulatory Visit: Payer: Self-pay

## 2020-07-09 ENCOUNTER — Encounter: Payer: Self-pay | Admitting: Occupational Therapy

## 2020-07-09 NOTE — Therapy (Signed)
[facility] [address], [place], [address] Phone: [phone]   Fax:  [phone]  Occupational Therapy Treatment  Patient Details  Name: Douglas Bird MRN: [id number] Date of Birth: 02/07/1966 Referring Provider (OT): Dr Roland Rack   Encounter Date: 07/06/2020   OT End of Session - 07/09/20 1935    Visit Number 10    Number of Visits 12    Date for OT Re-Evaluation 07/16/20    OT Start Time 0900    OT Stop Time 0946    OT Time Calculation (min) 46 min    Activity Tolerance Patient tolerated treatment well    Behavior During Therapy [facility] for tasks assessed/performed           Past Medical History:  Diagnosis Date  . Diverticulitis 2011  . GERD (gastroesophageal reflux disease)   . Hyperlipidemia   . Hypertension    controlled on meds  . Hypothyroidism   . Thyroid disease     Past Surgical History:  Procedure Laterality Date  . CHOLECYSTECTOMY  June 2013  . COLONOSCOPY WITH PROPOFOL N/A 04/28/2019   Procedure: COLONOSCOPY WITH PROPOFOL;  Surgeon: Lin Landsman, MD;  Location: [place];  Service: Endoscopy;  Laterality: N/A;  priority 4  . HERNIA REPAIR Bilateral    inguinal  . REPAIR EXTENSOR TENDON Right 04/28/2020   Procedure: EXPLORATION OF RIGHT DORSAL HAND LACERATION WITH REPAIR OF INDEX EXTENSOR TENDON LACERATION;  Surgeon: Corky Mull, MD;  Location: [facility];  Service: Orthopedics;  Laterality: Right;  . UMBILICAL HERNIA REPAIR N/A 08/11/2019   Procedure: HERNIA REPAIR UMBILICAL ADULT;  Surgeon: Fredirick Maudlin, MD;  Location: [facility];  Service: General;  Laterality: N/A;  RNFA    There were no vitals filed for this visit.   Subjective Assessment - 07/09/20 1932    Subjective  Reports 2/10 was aching last night.  Has been doing putty exercises.    Pertinent History Pt had saw accident on 04/26/20 at [facility] and then Surgery by Dr Roland Rack on 04/28/20 - Primary repair of right EIP  and index EDC tendons.2. Irrigation and debridement with primary repair of right dorsal hand laceration (approximately 10 cm in length). Sutures were taken out on 05/10/20 and kept on dorsal splint - and then follow up again at [facility] 05/24/20 - kept in dorsal forearm splint and refer to OT/hand therapy - but with workers comp -referral to OT did not happend until 06/02/20 - pt now about 5 1/2 wks s/p    Patient Stated Goals Want to get the use of my R hand back to be able to work and take care of the animals and yard /garden at home    Currently in Pain? Yes    Pain Score 2     Pain Location Hand    Pain Orientation Right    Pain Descriptors / Indicators Aching    Pain Type Acute pain    Pain Onset More than a month ago    Pain Frequency Intermittent           Performed contrast method with use of fluidotherapy and cold packs alternating: Fluido 3 mins, cold 2 min, repeat 2x and end with 4 mins fluido prior to therapeutic exercise.   Soft tissue mobs performed to webspace and MC's of R hand and joint prior to ROM  Composite fisting manual prolonged stretch.   AAROM and AROM in splint for PIP flexion 10 reps  hold 5 sec - less than 1-2/10 pull  And then rubber band for 2nd digit extension for MC and PIP - 12 reps 2 x day  Pain free AAROM for Composite wrist/digits flexion table slides for composite extension of wrist and digits 20 reps- review for pt to keep pain free Cont with 3 lbs for wrist in all planes 12  Reps -2 sets except wrist extension 1 lbs only 2-3 sets 2 x day       Cont to Monitor for extension lag Pt to cont with knuckle bender splint - time same 2 x 2 min - use 2 bands radial side and 3 ulnar side Flexion glove to 2nd and 3rd digit- to use in knuckle bender 2 x 2 min for composite flexion stretch  Light putty gripping  - pain free and use all digits 3 x day 2 sets of 15 reps pain free or pain under 1-2/10 lat and 3 point grip- 12 reps - 3 sets   Response to tx: Pt 10.5  weeks s/p right repair of 2nd digit EIP and EDC with irrigation and debridement.  Patient continuing to progress with extension, minimal pain less than 2/10.  Performing well with HEP and able to increase to 3 sets of exercises with putty.  Still using 1# for wrist extension exercise.  Patient continues to benefit from skilled OT services to maximize safety and independence in necessary daily tasks.                       OT Education - 07/09/20 1934    Education Details progress and changes HEP    Person(s) Educated Patient    Methods Explanation;Demonstration;Tactile cues;Verbal cues;Handout    Comprehension Verbal cues required;Returned demonstration;Verbalized understanding            OT Short Term Goals - 07/09/20 1954      OT SHORT TERM GOAL #1   Title Pt to be independent in HEP and desentitization to tolerate scar massage and different textures    Baseline 4/10 at rest of scar - increase to 7/10  - very tender for scar massage and sensitive to soft and rougher textures - scar adhere - no hyper extention of MC 2nd and 3rd    Time 3    Period Weeks    Status Achieved    Target Date 06/25/20      OT SHORT TERM GOAL #2   Title Pt to be indepenent with use of  MC block plint and dynamic splints to increase flexion in digits while maintaining extention less than -15 degree lag    Baseline MC's -20 2nd and 3rd - -20 2nd PIP , -10 3rd PIP - flexion MC's 35-60, PIP's 50-85 degrees    Time 3    Period Weeks    Status On-going    Target Date 06/25/20             OT Long Term Goals - 07/09/20 1955      OT LONG TERM GOAL #1   Title Pt R hand digits flexion increase for pt to touch palm to grip ADLs objects with pain less than 3/10 and extentio lag less than -15    Baseline extention lag -20 at 2nd [facility] and PIP , -20 at PIP of 3rd - MC flexion MC 35-50 and PIP's 50-85 - pain increase 4-7/10    Time 5    Period Weeks    Status On-going  Target Date 07/09/20       OT LONG TERM GOAL #2   Title Grip strength increase to more than 50% compare to L to grip and pick up objects more than 5 lbs without symptoms    Baseline NT - 5 1/2 wks s/p - in splint all the time    Time 6    Period Weeks    Status On-going    Target Date 07/16/20      OT LONG TERM GOAL #3   Title Prehension strength increase to more than 50% compare to L to cut food, open packages, use in farm with animals    Baseline NT - 5 1/2 wks s/p - in splint 100% of time - pain 4-7/10    Time 6    Period Weeks    Status On-going    Target Date 07/16/20      OT LONG TERM GOAL #4   Title Extention of 2nd and 3rd to be less than -15 lag and able to push buttons and donn gloves    Baseline 5 1/2 wks s/p - extention lag -20 at 2nd [facility] and PIP , 3rd PIP - in splint all time time    Time 6    Period Weeks    Status On-going    Target Date 07/16/20      OT LONG TERM GOAL #5   Title Function on PRWHE improve more than 30 points    Baseline function score at eval 50/50 - in splint - no using hand in anything    Time 6    Period Weeks    Status On-going    Target Date 07/16/20                 Plan - 07/09/20 1935    Clinical Impression Statement Pt 10.5 weeks s/p right repair of 2nd digit EIP and EDC with irrigation and debridement.  Patient continuing to progress with extension, minimal pain less than 2/10.  Performing well with HEP and able to increase to 3 sets of exercises with putty.  Still using 1# for wrist extension exercise.  Patient continues to benefit from skilled OT services to maximize safety and independence in necessary daily tasks.    OT Occupational Profile and History Problem Focused Assessment - Including review of records relating to presenting problem    Occupational performance deficits (Please refer to evaluation for details): ADL's;IADL's;Rest and Sleep;Work;Play;Leisure;Social Participation    Body Structure / Function / Physical Skills  ADL;Edema;Dexterity;Decreased knowledge of use of DME;Decreased knowledge of precautions;Flexibility;ROM;UE functional use;Scar mobility;Pain;Strength;IADL;Coordination    Rehab Potential Good    Clinical Decision Making Limited treatment options, no task modification necessary    Comorbidities Affecting Occupational Performance: None    Modification or Assistance to Complete Evaluation  No modification of tasks or assist necessary to complete eval    OT Frequency 2x / week    OT Duration 6 weeks    OT Treatment/Interventions Self-care/ADL training;Contrast Bath;Fluidtherapy;Moist Heat;Paraffin;Therapeutic exercise;Ultrasound;DME and/or AE instruction;Manual Therapy;Passive range of motion;Scar mobilization;Splinting;Patient/family education    Consulted and Agree with Plan of Care Patient           Patient will benefit from skilled therapeutic intervention in order to improve the following deficits and impairments:   Body Structure / Function / Physical Skills: ADL,Edema,Dexterity,Decreased knowledge of use of DME,Decreased knowledge of precautions,Flexibility,ROM,UE functional use,Scar mobility,Pain,Strength,IADL,Coordination       Visit Diagnosis: Stiffness of right hand, not elsewhere classified  Scar condition  and fibrosis of skin  Pain in right hand  Stiffness of right wrist, not elsewhere classified  Muscle weakness (generalized)    Problem List Patient Active Problem List   Diagnosis Date Noted  . Toothache 02/20/2020  . Headache 02/20/2020  . Smoking 02/20/2020  . Umbilical hernia, incarcerated   . Back pain 03/27/2019  . Benign hypertension 05/02/2018  . Obesity (BMI 30.0-34.9) 04/30/2018  . Healthcare maintenance 10/19/2016  . Flu vaccine need 04/20/2016  . GERD (gastroesophageal reflux disease) 04/01/2015  . Gout 04/01/2015  . Mixed hyperlipidemia 10/01/2014  . Hypothyroidism 05/21/2014   Douglas Bird, OTR/L, CLT  Douglas Bird 07/09/2020, 7:57 PM  [facility] [address], [place], [address] Phone: [phone]   Fax:  [phone]  Name: Douglas Bird MRN: [id number] Date of Birth: June 21, 1965

## 2020-07-13 ENCOUNTER — Other Ambulatory Visit: Payer: Self-pay

## 2020-07-13 ENCOUNTER — Ambulatory Visit: Payer: PRIVATE HEALTH INSURANCE | Admitting: Occupational Therapy

## 2020-07-13 DIAGNOSIS — M6281 Muscle weakness (generalized): Secondary | ICD-10-CM

## 2020-07-13 DIAGNOSIS — L905 Scar conditions and fibrosis of skin: Secondary | ICD-10-CM

## 2020-07-13 DIAGNOSIS — M25641 Stiffness of right hand, not elsewhere classified: Secondary | ICD-10-CM

## 2020-07-13 DIAGNOSIS — M25631 Stiffness of right wrist, not elsewhere classified: Secondary | ICD-10-CM

## 2020-07-13 DIAGNOSIS — M79641 Pain in right hand: Secondary | ICD-10-CM

## 2020-07-13 NOTE — Therapy (Signed)
[facility] [address], [place], [address] Phone: [phone]   Fax:  [phone]  Occupational Therapy Treatment  Patient Details  Name: Douglas Bird MRN: [id number] Date of Birth: Dec 18, 1965 Referring Provider (OT): Dr Roland Rack   Encounter Date: 07/13/2020   OT End of Session - 07/13/20 1040    Visit Number 11    Number of Visits 12    Date for OT Re-Evaluation 07/16/20    OT Start Time 1019    OT Stop Time 1115    OT Time Calculation (min) 56 min    Activity Tolerance Patient tolerated treatment well    Behavior During Therapy [facility] for tasks assessed/performed           Past Medical History:  Diagnosis Date  . Diverticulitis 2011  . GERD (gastroesophageal reflux disease)   . Hyperlipidemia   . Hypertension    controlled on meds  . Hypothyroidism   . Thyroid disease     Past Surgical History:  Procedure Laterality Date  . CHOLECYSTECTOMY  June 2013  . COLONOSCOPY WITH PROPOFOL N/A 04/28/2019   Procedure: COLONOSCOPY WITH PROPOFOL;  Surgeon: Lin Landsman, MD;  Location: [place];  Service: Endoscopy;  Laterality: N/A;  priority 4  . HERNIA REPAIR Bilateral    inguinal  . REPAIR EXTENSOR TENDON Right 04/28/2020   Procedure: EXPLORATION OF RIGHT DORSAL HAND LACERATION WITH REPAIR OF INDEX EXTENSOR TENDON LACERATION;  Surgeon: Corky Mull, MD;  Location: [facility];  Service: Orthopedics;  Laterality: Right;  . UMBILICAL HERNIA REPAIR N/A 08/11/2019   Procedure: HERNIA REPAIR UMBILICAL ADULT;  Surgeon: Fredirick Maudlin, MD;  Location: [facility];  Service: General;  Laterality: N/A;  RNFA    There were no vitals filed for this visit.   Subjective Assessment - 07/13/20 1021    Subjective  Since Saturday sore at the wrist and was swollen - had to take some pain meds - with 2 lbs weight - using it more - like moving light hammer - 20 lbs    Pertinent History Pt had saw accident on  04/26/20 at [facility] and then Surgery by Dr Roland Rack on 04/28/20 - Primary repair of right EIP and index EDC tendons.2. Irrigation and debridement with primary repair of right dorsal hand laceration (approximately 10 cm in length). Sutures were taken out on 05/10/20 and kept on dorsal splint - and then follow up again at [facility] 05/24/20 - kept in dorsal forearm splint and refer to OT/hand therapy - but with workers comp -referral to OT did not happend until 06/02/20 - pt now about 5 1/2 wks s/p    Patient Stated Goals Want to get the use of my R hand back to be able to work and take care of the animals and yard /garden at home    Currently in Pain? Yes    Pain Score 3     Pain Location Wrist    Pain Orientation Right    Pain Descriptors / Indicators Aching;Sore    Pain Type Acute pain    Pain Onset More than a month ago    Pain Frequency Intermittent              [facility] OT Assessment - 07/13/20 0001      Strength   Right Hand Grip (lbs) 45    Right Hand Lateral Pinch 21 lbs    Right Hand 3 Point Pinch 14 lbs  Left Hand Grip (lbs) 78    Left Hand Lateral Pinch 22 lbs      Right Hand AROM   R Index  MCP 0-90 70 Degrees    R Index PIP 0-100 90 Degrees    R Long  MCP 0-90 70 Degrees    R Long PIP 0-100 90 Degrees    R Ring  MCP 0-90 75 Degrees    R Ring PIP 0-100 100 Degrees    R Little  MCP 0-90 90 Degrees    R Little PIP 0-100 90 Degrees           Cont to make progress in grip and prehension -but MC flexion still decrease  Had increase soreness since Weekend - review with pt again use of hand at home and HEP    Pt able to push and pull door - and pick up and carry 5 lbs without pain - but with time - pain increase at dorsal wrist  Cut food - but difficulty with pressure of 2nd digit on knife and gripping it  Pushing up on chair - some pain - 40% on R , L 60%      OT Treatments/Exercises (OP) - 07/13/20 0001      RUE Fluidotherapy   Number Minutes Fluidotherapy 10 Minutes    RUE  Fluidotherapy Location Hand;Wrist    Comments AROM for wrist and tendon glides - ice 2x 2 min inbetween             Soft tissue mobs done to webspace and MC's of R hand -and joint - prior to ROM Cont having some tenderness and Tinel over dorsal hand   Pt to hold off on knuckle bender and flexion glove this week -and ed and done PROM - hold stretch for MC flexion 3x 1 min  And then add composite flexion to digits 3 x 1 min  Upgrade to teal putty for gripping - 12 reps and 3 point pinch 12 reps   Can use 16 oz hammer for RD< UD , sup/pro- 15 reps - pain free- and hold 1/2 way   3 lbs for elbow and shoulder -12 reps - but can wear if needed Benik wrist wrap   cont with table slides for composite extention of wrist and digits 20 reps Hold off on 1 lbs weight for wrist  cont to increase use of R hand around house - but keep pain under 2/10      OT Education - 07/13/20 1040    Education Details progress and changes HEP    Person(s) Educated Patient    Methods Explanation;Demonstration;Tactile cues;Verbal cues;Handout    Comprehension Verbal cues required;Returned demonstration;Verbalized understanding            OT Short Term Goals - 07/09/20 1954      OT SHORT TERM GOAL #1   Title Pt to be independent in HEP and desentitization to tolerate scar massage and different textures    Baseline 4/10 at rest of scar - increase to 7/10  - very tender for scar massage and sensitive to soft and rougher textures - scar adhere - no hyper extention of MC 2nd and 3rd    Time 3    Period Weeks    Status Achieved    Target Date 06/25/20      OT SHORT TERM GOAL #2   Title Pt to be indepenent with use of  MC block plint and dynamic splints to increase flexion in digits  while maintaining extention less than -15 degree lag    Baseline MC's -20 2nd and 3rd - -20 2nd PIP , -10 3rd PIP - flexion MC's 35-60, PIP's 50-85 degrees    Time 3    Period Weeks    Status On-going    Target Date  06/25/20             OT Long Term Goals - 07/09/20 1955      OT LONG TERM GOAL #1   Title Pt R hand digits flexion increase for pt to touch palm to grip ADLs objects with pain less than 3/10 and extentio lag less than -15    Baseline extention lag -20 at 2nd [facility] and PIP , -20 at PIP of 3rd - MC flexion MC 35-50 and PIP's 50-85 - pain increase 4-7/10    Time 5    Period Weeks    Status On-going    Target Date 07/09/20      OT LONG TERM GOAL #2   Title Grip strength increase to more than 50% compare to L to grip and pick up objects more than 5 lbs without symptoms    Baseline NT - 5 1/2 wks s/p - in splint all the time    Time 6    Period Weeks    Status On-going    Target Date 07/16/20      OT LONG TERM GOAL #3   Title Prehension strength increase to more than 50% compare to L to cut food, open packages, use in farm with animals    Baseline NT - 5 1/2 wks s/p - in splint 100% of time - pain 4-7/10    Time 6    Period Weeks    Status On-going    Target Date 07/16/20      OT LONG TERM GOAL #4   Title Extention of 2nd and 3rd to be less than -15 lag and able to push buttons and donn gloves    Baseline 5 1/2 wks s/p - extention lag -20 at 2nd [facility] and PIP , 3rd PIP - in splint all time time    Time 6    Period Weeks    Status On-going    Target Date 07/16/20      OT LONG TERM GOAL #5   Title Function on PRWHE improve more than 30 points    Baseline function score at eval 50/50 - in splint - no using hand in anything    Time 6    Period Weeks    Status On-going    Target Date 07/16/20                 Plan - 07/13/20 1041    Clinical Impression Statement Pt 11 weeks s/p right repair of 2nd digit EIP and EDC with irrigation and debridement.  Patient show increase grip and 3 point grip- but increase dorsal wrist pain this date - did try and see if he can pick up and hammer or use staple gun - wrist in all plans 4+/5 - pt to hold off on wrist extention -and focus on MC  flexion and composite flexion - and increase resistance for putty for grip and 3 point - but keep pain under 2/10 - can use 3 lbs weight for elbow and shoulder - and 16oz hammer for sup/pro, RD, UD but HOLD middle of shaft - can use wrist neoprene wrap - increase functional use at home - but keep pain  under 2/10  Patient continues to benefit from skilled OT services to maximize safety and independence in necessary daily tasks.    OT Occupational Profile and History Problem Focused Assessment - Including review of records relating to presenting problem    Occupational performance deficits (Please refer to evaluation for details): ADL's;IADL's;Rest and Sleep;Work;Play;Leisure;Social Participation    Body Structure / Function / Physical Skills ADL;Edema;Dexterity;Decreased knowledge of use of DME;Decreased knowledge of precautions;Flexibility;ROM;UE functional use;Scar mobility;Pain;Strength;IADL;Coordination    Rehab Potential Good    Clinical Decision Making Limited treatment options, no task modification necessary    Comorbidities Affecting Occupational Performance: None    Modification or Assistance to Complete Evaluation  No modification of tasks or assist necessary to complete eval    OT Frequency 2x / week    OT Duration 6 weeks    OT Treatment/Interventions Self-care/ADL training;Contrast Bath;Fluidtherapy;Moist Heat;Paraffin;Therapeutic exercise;Ultrasound;DME and/or AE instruction;Manual Therapy;Passive range of motion;Scar mobilization;Splinting;Patient/family education    Consulted and Agree with Plan of Care Patient           Patient will benefit from skilled therapeutic intervention in order to improve the following deficits and impairments:   Body Structure / Function / Physical Skills: ADL,Edema,Dexterity,Decreased knowledge of use of DME,Decreased knowledge of precautions,Flexibility,ROM,UE functional use,Scar mobility,Pain,Strength,IADL,Coordination       Visit Diagnosis: Scar  condition and fibrosis of skin  Pain in right hand  Stiffness of right wrist, not elsewhere classified  Muscle weakness (generalized)  Stiffness of right hand, not elsewhere classified    Problem List Patient Active Problem List   Diagnosis Date Noted  . Toothache 02/20/2020  . Headache 02/20/2020  . Smoking 02/20/2020  . Umbilical hernia, incarcerated   . Back pain 03/27/2019  . Benign hypertension 05/02/2018  . Obesity (BMI 30.0-34.9) 04/30/2018  . Healthcare maintenance 10/19/2016  . Flu vaccine need 04/20/2016  . GERD (gastroesophageal reflux disease) 04/01/2015  . Gout 04/01/2015  . Mixed hyperlipidemia 10/01/2014  . Hypothyroidism 05/21/2014    Rosalyn Gess OTR/L,CLT 07/13/2020, 12:26 PM  [facility] [address], [place], [address] Phone: [phone]   Fax:  [phone]  Name: Douglas Bird MRN: [id number] Date of Birth: 16-Oct-1965

## 2020-07-13 NOTE — Therapy (Deleted)
[facility] [address], [place], [address] Phone: [phone]   Fax:  [phone]  July 13, 2020    No Recipients  Occupational Therapy Discharge Summary   Patient: Douglas Bird MRN: [id number] Date of Birth: 1965-09-28  Diagnosis: Scar condition and fibrosis of skin  Pain in right hand  Stiffness of right wrist, not elsewhere classified  Muscle weakness (generalized)  Stiffness of right hand, not elsewhere classified  Referring Provider (OT): Dr Roland Rack   The above patient had been seen in Occupational Therapy *** times of *** treatments scheduled with *** no shows and *** cancellations.  The treatment consisted of *** The patient is: {improved/worse/unchanged:[id number]}  Subjective: ***  Discharge Findings: ***  Functional Status at Discharge: ***  {ZJIRC:[id number]}   Plan - 07/13/20 1041    Clinical Impression Statement Pt 11 weeks s/p right repair of 2nd digit EIP and EDC with irrigation and debridement.  Patient show increase grip and 3 point grip- but increase dorsal wrist pain this date - did try and see if he can pick up and hammer or use staple gun - wrist in all plans 4+/5 - pt to hold off on wrist extention -and focus on MC flexion and composite flexion - and increase resistance for putty for grip and 3 point - but keep pain under 2/10 - can use 3 lbs weight for elbow and shoulder - and 16oz hammer for sup/pro, RD, UD but HOLD middle of shaft - can use wrist neoprene wrap - increase functional use at home - but keep pain under 2/10  Patient continues to benefit from skilled OT services to maximize safety and independence in necessary daily tasks.    OT Occupational Profile and History Problem Focused Assessment - Including review of records relating to presenting problem    Occupational performance deficits (Please refer to evaluation for details): ADL's;IADL's;Rest and  Sleep;Work;Play;Leisure;Social Participation    Body Structure / Function / Physical Skills ADL;Edema;Dexterity;Decreased knowledge of use of DME;Decreased knowledge of precautions;Flexibility;ROM;UE functional use;Scar mobility;Pain;Strength;IADL;Coordination    Rehab Potential Good    Clinical Decision Making Limited treatment options, no task modification necessary    Comorbidities Affecting Occupational Performance: None    Modification or Assistance to Complete Evaluation  No modification of tasks or assist necessary to complete eval    OT Frequency 2x / week    OT Duration 6 weeks    OT Treatment/Interventions Self-care/ADL training;Contrast Bath;Fluidtherapy;Moist Heat;Paraffin;Therapeutic exercise;Ultrasound;DME and/or AE instruction;Manual Therapy;Passive range of motion;Scar mobilization;Splinting;Patient/family education    Consulted and Agree with Plan of Care Patient           Sincerely,  Rosalyn Gess, OT  CC No Recipients  [facility] [address] [place], [address] Phone: [phone]   Fax:  [phone]  Patient: Douglas Bird MRN: [id number] Date of Birth: May 11, 1965

## 2020-07-15 ENCOUNTER — Other Ambulatory Visit: Payer: Self-pay

## 2020-07-15 ENCOUNTER — Ambulatory Visit: Payer: PRIVATE HEALTH INSURANCE | Attending: Student | Admitting: Occupational Therapy

## 2020-07-15 DIAGNOSIS — M25631 Stiffness of right wrist, not elsewhere classified: Secondary | ICD-10-CM | POA: Insufficient documentation

## 2020-07-15 DIAGNOSIS — L905 Scar conditions and fibrosis of skin: Secondary | ICD-10-CM | POA: Insufficient documentation

## 2020-07-15 DIAGNOSIS — M25641 Stiffness of right hand, not elsewhere classified: Secondary | ICD-10-CM | POA: Insufficient documentation

## 2020-07-15 DIAGNOSIS — M79641 Pain in right hand: Secondary | ICD-10-CM | POA: Insufficient documentation

## 2020-07-15 DIAGNOSIS — M6281 Muscle weakness (generalized): Secondary | ICD-10-CM | POA: Insufficient documentation

## 2020-07-15 NOTE — Therapy (Signed)
[facility] [address], [place], [address] Phone: [phone]   Fax:  [phone]  Occupational Therapy Treatment  Patient Details  Name: Douglas Bird MRN: [id number] Date of Birth: [birth year] Referring Provider (OT): Dr Roland Rack   Encounter Date: 07/15/2020   OT End of Session - 07/15/20 0857    Visit Number 12    Number of Visits 12    Date for OT Re-Evaluation 07/16/20    OT Start Time 0900    OT Stop Time 0941    OT Time Calculation (min) 41 min    Activity Tolerance Patient tolerated treatment well    Behavior During Therapy [facility] for tasks assessed/performed           Past Medical History:  Diagnosis Date  . Diverticulitis 2011  . GERD (gastroesophageal reflux disease)   . Hyperlipidemia   . Hypertension    controlled on meds  . Hypothyroidism   . Thyroid disease     Past Surgical History:  Procedure Laterality Date  . CHOLECYSTECTOMY  June 2013  . COLONOSCOPY WITH PROPOFOL N/A 04/28/2019   Procedure: COLONOSCOPY WITH PROPOFOL;  Surgeon: Lin Landsman, MD;  Location: [place];  Service: Endoscopy;  Laterality: N/A;  priority 4  . HERNIA REPAIR Bilateral    inguinal  . REPAIR EXTENSOR TENDON Right 04/28/2020   Procedure: EXPLORATION OF RIGHT DORSAL HAND LACERATION WITH REPAIR OF INDEX EXTENSOR TENDON LACERATION;  Surgeon: Corky Mull, MD;  Location: [facility];  Service: Orthopedics;  Laterality: Right;  . UMBILICAL HERNIA REPAIR N/A 08/11/2019   Procedure: HERNIA REPAIR UMBILICAL ADULT;  Surgeon: Fredirick Maudlin, MD;  Location: [facility];  Service: General;  Laterality: N/A;  RNFA    There were no vitals filed for this visit.   Subjective Assessment - 07/15/20 0856    Subjective  Pain is better- using my hand more with opening doors, brushing animals - hammer exercise - - stil cannot pull rope on weed eater    Pertinent History Pt had saw accident on 04/26/20 at [facility] and  then Surgery by Dr Roland Rack on 04/28/20 - Primary repair of right EIP and index EDC tendons.2. Irrigation and debridement with primary repair of right dorsal hand laceration (approximately 10 cm in length). Sutures were taken out on 05/10/20 and kept on dorsal splint - and then follow up again at [facility] 05/24/20 - kept in dorsal forearm splint and refer to OT/hand therapy - but with workers comp -referral to OT did not happend until 06/02/20 - pt now about 5 1/2 wks s/p    Patient Stated Goals Want to get the use of my R hand back to be able to work and take care of the animals and yard /garden at home    Currently in Pain? Yes    Pain Score 1     Pain Location Hand    Pain Orientation Right    Pain Descriptors / Indicators Aching    Pain Type Acute pain    Pain Onset More than a month ago    Pain Frequency Intermittent           MC 2nd and PIP - each 90 degrees PROM in session  Pain - or stretch 2/10             OT Treatments/Exercises (OP) - 07/15/20 0001      RUE Fluidotherapy   Number Minutes Fluidotherapy 10 Minutes    RUE  Fluidotherapy Location Hand;Wrist    Comments AROM wrist and diigts - ice inbetween 2 x 2 min            Soft tissue mobs done to webspace and MC's of R hand -and joint - prior to ROM Cont having some tenderness and Tinel over dorsal hand   Pt to hold off on knuckle bender and flexion glove this week -and ed and done PROM - hold stretch for MC flexion 3x 1 min  Done in clinic And then add composite flexion to digits 3 x 1 min  Upgrade to teal putty for gripping - 12 reps and 3 point pinch 12 reps  2 sets this date - no increase pain 3rd set over weekend if pain free   Can use 16 oz hammer for RD< UD , sup/pro- 15 reps - pain free- and hold 1/2 way  2nd set this date - then increase 3 sets over weekend  3 lbs for elbow and shoulder -12 reps - but can wear if needed Benik wrist wrap   cont with table slides for composite extention of wrist and  digits 20 reps Hold off on 1 lbs weight for wrist ext cont to increase use of R hand around house - but keep pain under 2/10        OT Education - 07/15/20 0857    Education Details progress and changes HEP    Person(s) Educated Patient    Methods Explanation;Demonstration;Tactile cues;Verbal cues;Handout    Comprehension Verbal cues required;Returned demonstration;Verbalized understanding            OT Short Term Goals - 07/09/20 1954      OT SHORT TERM GOAL #1   Title Pt to be independent in HEP and desentitization to tolerate scar massage and different textures    Baseline 4/10 at rest of scar - increase to 7/10  - very tender for scar massage and sensitive to soft and rougher textures - scar adhere - no hyper extention of MC 2nd and 3rd    Time 3    Period Weeks    Status Achieved    Target Date 06/25/20      OT SHORT TERM GOAL #2   Title Pt to be indepenent with use of  MC block plint and dynamic splints to increase flexion in digits while maintaining extention less than -15 degree lag    Baseline MC's -20 2nd and 3rd - -20 2nd PIP , -10 3rd PIP - flexion MC's 35-60, PIP's 50-85 degrees    Time 3    Period Weeks    Status On-going    Target Date 06/25/20             OT Long Term Goals - 07/09/20 1955      OT LONG TERM GOAL #1   Title Pt R hand digits flexion increase for pt to touch palm to grip ADLs objects with pain less than 3/10 and extentio lag less than -15    Baseline extention lag -20 at 2nd [facility] and PIP , -20 at PIP of 3rd - MC flexion MC 35-50 and PIP's 50-85 - pain increase 4-7/10    Time 5    Period Weeks    Status On-going    Target Date 07/09/20      OT LONG TERM GOAL #2   Title Grip strength increase to more than 50% compare to L to grip and pick up objects more than 5 lbs without symptoms  Baseline NT - 5 1/2 wks s/p - in splint all the time    Time 6    Period Weeks    Status On-going    Target Date 07/16/20      OT LONG TERM GOAL #3    Title Prehension strength increase to more than 50% compare to L to cut food, open packages, use in farm with animals    Baseline NT - 5 1/2 wks s/p - in splint 100% of time - pain 4-7/10    Time 6    Period Weeks    Status On-going    Target Date 07/16/20      OT LONG TERM GOAL #4   Title Extention of 2nd and 3rd to be less than -15 lag and able to push buttons and donn gloves    Baseline 5 1/2 wks s/p - extention lag -20 at 2nd [facility] and PIP , 3rd PIP - in splint all time time    Time 6    Period Weeks    Status On-going    Target Date 07/16/20      OT LONG TERM GOAL #5   Title Function on PRWHE improve more than 30 points    Baseline function score at eval 50/50 - in splint - no using hand in anything    Time 6    Period Weeks    Status On-going    Target Date 07/16/20                 Plan - 07/15/20 0857    Clinical Impression Statement Pt 11 1/2  weeks s/p right repair of 2nd digit EIP and EDC with irrigation and debridement.  Wrist pain better this date able to push up from chair without pain -focus on MC flexion and composite flexion this date in session and pt to do at home - and increase resistance for putty for grip and 3 point and can increas sets the next few days  - but keep pain under 2/10 - can use 3 lbs weight for elbow and shoulder - and 16oz hammer for sup/pro, RD, UD but HOLD middle of shaft  - able to increase to 2nd set this date -and over weekend 3rd set if no pain - can use wrist neoprene wrap - increase functional use at home - but keep pain under 2/10  Patient continues to benefit from skilled OT services to maximize safety and independence in necessary daily tasks.    OT Occupational Profile and History Problem Focused Assessment - Including review of records relating to presenting problem    Occupational performance deficits (Please refer to evaluation for details): ADL's;IADL's;Rest and Sleep;Work;Play;Leisure;Social Participation    Body Structure /  Function / Physical Skills ADL;Edema;Dexterity;Decreased knowledge of use of DME;Decreased knowledge of precautions;Flexibility;ROM;UE functional use;Scar mobility;Pain;Strength;IADL;Coordination    Rehab Potential Good    Clinical Decision Making Limited treatment options, no task modification necessary    Comorbidities Affecting Occupational Performance: None    Modification or Assistance to Complete Evaluation  No modification of tasks or assist necessary to complete eval    OT Frequency 2x / week    OT Duration 6 weeks    OT Treatment/Interventions Self-care/ADL training;Contrast Bath;Fluidtherapy;Moist Heat;Paraffin;Therapeutic exercise;Ultrasound;DME and/or AE instruction;Manual Therapy;Passive range of motion;Scar mobilization;Splinting;Patient/family education    Consulted and Agree with Plan of Care Patient           Patient will benefit from skilled therapeutic intervention in order to improve the following deficits  and impairments:   Body Structure / Function / Physical Skills: ADL,Edema,Dexterity,Decreased knowledge of use of DME,Decreased knowledge of precautions,Flexibility,ROM,UE functional use,Scar mobility,Pain,Strength,IADL,Coordination       Visit Diagnosis: Scar condition and fibrosis of skin  Pain in right hand  Stiffness of right wrist, not elsewhere classified  Muscle weakness (generalized)  Stiffness of right hand, not elsewhere classified    Problem List Patient Active Problem List   Diagnosis Date Noted  . Toothache 02/20/2020  . Headache 02/20/2020  . Smoking 02/20/2020  . Umbilical hernia, incarcerated   . Back pain 03/27/2019  . Benign hypertension 05/02/2018  . Obesity (BMI 30.0-34.9) 04/30/2018  . Healthcare maintenance 10/19/2016  . Flu vaccine need 04/20/2016  . GERD (gastroesophageal reflux disease) 04/01/2015  . Gout 04/01/2015  . Mixed hyperlipidemia 10/01/2014  . Hypothyroidism 05/21/2014    Rosalyn Gess OTR/L,CLT 07/15/2020,  9:44 AM  [facility] [address] [place], [address] Phone: [phone]   Fax:  [phone]  Name: DAISY MCNEEL MRN: [id number] Date of Birth: January 25, 1966

## 2020-07-19 ENCOUNTER — Ambulatory Visit: Payer: PRIVATE HEALTH INSURANCE | Attending: Student | Admitting: Occupational Therapy

## 2020-07-19 ENCOUNTER — Other Ambulatory Visit: Payer: Self-pay

## 2020-07-19 DIAGNOSIS — M6281 Muscle weakness (generalized): Secondary | ICD-10-CM | POA: Insufficient documentation

## 2020-07-19 DIAGNOSIS — L905 Scar conditions and fibrosis of skin: Secondary | ICD-10-CM | POA: Diagnosis present

## 2020-07-19 DIAGNOSIS — M25631 Stiffness of right wrist, not elsewhere classified: Secondary | ICD-10-CM | POA: Diagnosis present

## 2020-07-19 DIAGNOSIS — M25641 Stiffness of right hand, not elsewhere classified: Secondary | ICD-10-CM | POA: Insufficient documentation

## 2020-07-19 DIAGNOSIS — M79641 Pain in right hand: Secondary | ICD-10-CM | POA: Insufficient documentation

## 2020-07-19 NOTE — Therapy (Signed)
[facility] [address] [place], [address] Phone: [phone]   Fax:  [phone]  Occupational Therapy Treatment  Patient Details  Name: Douglas Bird MRN: [id number] Date of Birth: 10-18-1965 Referring Provider (OT): Dr Roland Rack   Encounter Date: 07/19/2020   OT End of Session - 07/19/20 0817    Visit Number 13    Number of Visits 25    Date for OT Re-Evaluation 08/30/20    OT Start Time 0815    OT Stop Time 0900    OT Time Calculation (min) 45 min    Activity Tolerance Patient tolerated treatment well    Behavior During Therapy [facility] for tasks assessed/performed           Past Medical History:  Diagnosis Date  . Diverticulitis 2011  . GERD (gastroesophageal reflux disease)   . Hyperlipidemia   . Hypertension    controlled on meds  . Hypothyroidism   . Thyroid disease     Past Surgical History:  Procedure Laterality Date  . CHOLECYSTECTOMY  June 2013  . COLONOSCOPY WITH PROPOFOL N/A 04/28/2019   Procedure: COLONOSCOPY WITH PROPOFOL;  Surgeon: Lin Landsman, MD;  Location: [facility];  Service: Endoscopy;  Laterality: N/A;  priority 4  . HERNIA REPAIR Bilateral    inguinal  . REPAIR EXTENSOR TENDON Right 04/28/2020   Procedure: EXPLORATION OF RIGHT DORSAL HAND LACERATION WITH REPAIR OF INDEX EXTENSOR TENDON LACERATION;  Surgeon: Corky Mull, MD;  Location: [facility];  Service: Orthopedics;  Laterality: Right;  . UMBILICAL HERNIA REPAIR N/A 08/11/2019   Procedure: HERNIA REPAIR UMBILICAL ADULT;  Surgeon: Fredirick Maudlin, MD;  Location: [facility];  Service: General;  Laterality: N/A;  RNFA    There were no vitals filed for this visit.   Subjective Assessment - 07/19/20 0825    Subjective  I did something Saturday- pull the handle of the door and my fingers slipped and the top of hand just burning since then - and hold off on exercises    Pertinent History Pt had saw accident on  04/26/20 at [facility] and then Surgery by Dr Roland Rack on 04/28/20 - Primary repair of right EIP and index EDC tendons.2. Irrigation and debridement with primary repair of right dorsal hand laceration (approximately 10 cm in length). Sutures were taken out on 05/10/20 and kept on dorsal splint - and then follow up again at [facility] 05/24/20 - kept in dorsal forearm splint and refer to OT/hand therapy - but with workers comp -referral to OT did not happend until 06/02/20 - pt now about 5 1/2 wks s/p    Patient Stated Goals Want to get the use of my R hand back to be able to work and take care of the animals and yard /garden at home    Currently in Pain? Yes    Pain Score 3     Pain Location Hand    Pain Orientation Right    Pain Descriptors / Indicators Burning;Tender    Pain Type Acute pain    Pain Onset More than a month ago    Pain Frequency Intermittent              [facility] OT Assessment - 07/19/20 0001      AROM   Right Wrist Extension 60 Degrees    Right Wrist Flexion 90 Degrees      Strength   Right Hand Grip (lbs) 45    Right Hand  Lateral Pinch 21 lbs    Right Hand 3 Point Pinch 14 lbs    Left Hand Grip (lbs) 78    Left Hand Lateral Pinch 22 lbs    Left Hand 3 Point Pinch 19 lbs                    OT Treatments/Exercises (OP) - 07/19/20 0001      RUE Fluidotherapy   Number Minutes Fluidotherapy 10 Minutes    RUE Fluidotherapy Location Hand;Wrist    Comments AROM wrist and hand ice 2x 2 min decrease pain            decrease pain during fluido with ice   pain decrease to 1-2 /10  Cont having some tenderness and Tinel over dorsal hand   Pt to hold off on knuckle bender and flexion glove this week -and ed and do PROM - hold stretch for MC flexion 3x 1 min  Done in clinic And then add composite flexion to digits 3 x 1 min  But keep pain under 2/10 - pain increase still this date after done something this weekend that increased his pain   cont teal putty for gripping - 12 reps  and 3 point pinch 12 reps Can do 2 sets pain free -and will assess if can do 2 sets or increase to  3rd set later this week   16 oz hammer for RD,UD , sup/pro- 15 reps - pain free- and hold 1/2 way  2nd set   4 lbs for elbow and shoulder -12 reps- 2 sets  - but can wear if needed Benik wrist wrap  cont withtable slides for composite extention of wrist and digits 20 reps Hold off on1 lbs weight for wristext cont to increase use of R hand around house - but keep pain under 2/10         OT Education - 07/19/20 0829    Education Details progress and changes HEP    Person(s) Educated Patient    Methods Explanation;Demonstration;Tactile cues;Verbal cues;Handout    Comprehension Verbal cues required;Returned demonstration;Verbalized understanding            OT Short Term Goals - 07/19/20 1217      OT SHORT TERM GOAL #1   Title Pt to be independent in HEP and desentitization to tolerate scar massage and different textures    Baseline 4/10 at rest of scar - increase to 7/10  - very tender for scar massage and sensitive to soft and rougher textures - scar adhere - no hyper extention of MC 2nd and 3rd- but positive Tinel    Time 3    Period Weeks    Status On-going    Target Date 08/09/20      OT SHORT TERM GOAL #2   Title Pt to be indepenent with use of  MC block plint and dynamic splints to increase flexion in digits while maintaining extention less than -15 degree lag    Status Achieved             OT Long Term Goals - 07/19/20 1218      OT LONG TERM GOAL #1   Title Pt R hand digits flexion increase for pt to touch palm to grip ADLs objects with pain less than 3/10 and extentio lag less than -15    Baseline extention lag -20 at 2nd [facility] and PIP , -20 at PIP of 3rd - MC flexion MC 35-50 and PIP's 50-85 -  pain increase 4-7/10 NOW - pt still decrease in 2nd and 3rd composite fist- but can grip objects    Time 4    Period Weeks    Status On-going    Target Date  08/16/20      OT LONG TERM GOAL #2   Title Grip strength increase to more than 50% compare to L to grip and pick up objects more than 5 lbs without symptoms    Baseline Grip in R 45, L 78 lbs, can carry 4 lbs - but increase pull with 5 lbs over dorsal hand    Time 6    Period Weeks    Status On-going    Target Date 08/30/20      OT LONG TERM GOAL #3   Title Prehension strength increase to more than 50% compare to L to cut food, open packages, use in farm with animals    Baseline Lat grip 21lbs R , L 22lbs - 3 point grip R 14 lbs L 19 lbs    Status Achieved      OT LONG TERM GOAL #4   Title Extention of 2nd and 3rd to be less than -15 lag and able to push buttons and donn gloves    Status Achieved      OT LONG TERM GOAL #5   Title Function on PRWHE improve more than 30 points    Baseline function score at eval 50/50 -  increase use but still hard using tools, gripping or holding animals, Will assess next time    Time 6    Period Weeks    Status On-going    Target Date 08/30/20                 Plan - 07/19/20 5573    Clinical Impression Statement Pt 12  weeks s/p right repair of 2nd digit EIP and EDC with irrigation and debridement.  Wrist pain better -able to do hammer HEP, 4 lbs weight for upper body and push up from chair without pain -pain decrease over dorsal hand during fluido and able to tolerate MC flexion - but not composite PROM flexion like last week - pt to gradually increase pressure for composite fist - cont with med teal putty for grip and 3 point  -able to tolerate in session  - but keep pain under 2/10 - pt to increase this week to 2nd set and will assess if can increase to 3rd set later this week- pt cont have what appear nerve pain over dorsal hand where surgery was - pt can cont to use wrist neoprene wrap as needed - increase functional use at home - but keep pain under 2/10  Patient continues to benefit from skilled OT services to maximize safety and  independence in necessary daily tasks.    OT Occupational Profile and History Problem Focused Assessment - Including review of records relating to presenting problem    Occupational performance deficits (Please refer to evaluation for details): ADL's;IADL's;Rest and Sleep;Work;Play;Leisure;Social Participation    Body Structure / Function / Physical Skills ADL;Edema;Dexterity;Decreased knowledge of use of DME;Decreased knowledge of precautions;Flexibility;ROM;UE functional use;Scar mobility;Pain;Strength;IADL;Coordination    Rehab Potential Good    Clinical Decision Making Limited treatment options, no task modification necessary    Comorbidities Affecting Occupational Performance: None    Modification or Assistance to Complete Evaluation  No modification of tasks or assist necessary to complete eval    OT Frequency 2x / week    OT Duration 6  weeks    OT Treatment/Interventions Self-care/ADL training;Contrast Bath;Fluidtherapy;Moist Heat;Paraffin;Therapeutic exercise;Ultrasound;DME and/or AE instruction;Manual Therapy;Passive range of motion;Scar mobilization;Splinting;Patient/family education    Consulted and Agree with Plan of Care Patient           Patient will benefit from skilled therapeutic intervention in order to improve the following deficits and impairments:   Body Structure / Function / Physical Skills: ADL,Edema,Dexterity,Decreased knowledge of use of DME,Decreased knowledge of precautions,Flexibility,ROM,UE functional use,Scar mobility,Pain,Strength,IADL,Coordination       Visit Diagnosis: Scar condition and fibrosis of skin  Pain in right hand  Stiffness of right wrist, not elsewhere classified  Muscle weakness (generalized)  Stiffness of right hand, not elsewhere classified    Problem List Patient Active Problem List   Diagnosis Date Noted  . Toothache 02/20/2020  . Headache 02/20/2020  . Smoking 02/20/2020  . Umbilical hernia, incarcerated   . Back pain  03/27/2019  . Benign hypertension 05/02/2018  . Obesity (BMI 30.0-34.9) 04/30/2018  . Healthcare maintenance 10/19/2016  . Flu vaccine need 04/20/2016  . GERD (gastroesophageal reflux disease) 04/01/2015  . Gout 04/01/2015  . Mixed hyperlipidemia 10/01/2014  . Hypothyroidism 05/21/2014    Rosalyn Gess OTR/L,CLT 07/19/2020, 12:22 PM  [facility] [address] [place], [address] Phone: [phone]   Fax:  [phone]  Name: Douglas Bird MRN: [id number] Date of Birth: 08/16/1965

## 2020-07-22 ENCOUNTER — Ambulatory Visit: Payer: PRIVATE HEALTH INSURANCE | Admitting: Occupational Therapy

## 2020-07-22 ENCOUNTER — Other Ambulatory Visit: Payer: Self-pay

## 2020-07-22 DIAGNOSIS — M25631 Stiffness of right wrist, not elsewhere classified: Secondary | ICD-10-CM

## 2020-07-22 DIAGNOSIS — L905 Scar conditions and fibrosis of skin: Secondary | ICD-10-CM | POA: Diagnosis not present

## 2020-07-22 DIAGNOSIS — M6281 Muscle weakness (generalized): Secondary | ICD-10-CM

## 2020-07-22 DIAGNOSIS — M79641 Pain in right hand: Secondary | ICD-10-CM

## 2020-07-22 DIAGNOSIS — M25641 Stiffness of right hand, not elsewhere classified: Secondary | ICD-10-CM

## 2020-07-22 NOTE — Therapy (Signed)
[facility] [address] [place], [address] Phone: [phone]   Fax:  [phone]  Occupational Therapy Treatment  Patient Details  Name: Douglas Bird MRN: [id number] Date of Birth: 1965/04/08 Referring Provider (OT): Dr Joice Lofts   Encounter Date: 07/22/2020   OT End of Session - 07/22/20 1229    Visit Number 14    Number of Visits 25    Date for OT Re-Evaluation 08/30/20    OT Start Time 1230    OT Stop Time 1316    OT Time Calculation (min) 46 min    Activity Tolerance Patient tolerated treatment well    Behavior During Therapy [facility] for tasks assessed/performed           Past Medical History:  Diagnosis Date  . Diverticulitis 2011  . GERD (gastroesophageal reflux disease)   . Hyperlipidemia   . Hypertension    controlled on meds  . Hypothyroidism   . Thyroid disease     Past Surgical History:  Procedure Laterality Date  . CHOLECYSTECTOMY  June 2013  . COLONOSCOPY WITH PROPOFOL N/A 04/28/2019   Procedure: COLONOSCOPY WITH PROPOFOL;  Surgeon: Toney Reil, MD;  Location: [facility];  Service: Endoscopy;  Laterality: N/A;  priority 4  . HERNIA REPAIR Bilateral    inguinal  . REPAIR EXTENSOR TENDON Right 04/28/2020   Procedure: EXPLORATION OF RIGHT DORSAL HAND LACERATION WITH REPAIR OF INDEX EXTENSOR TENDON LACERATION;  Surgeon: Christena Flake, MD;  Location: [facility];  Service: Orthopedics;  Laterality: Right;  . UMBILICAL HERNIA REPAIR N/A 08/11/2019   Procedure: HERNIA REPAIR UMBILICAL ADULT;  Surgeon: Duanne Guess, MD;  Location: [facility];  Service: General;  Laterality: N/A;  RNFA    There were no vitals filed for this visit.   Subjective Assessment - 07/22/20 1228    Subjective  I using my hand more - tried to clean the barn yesterday and got in about 20 min - but pain was to much - tried to do the weedeat but had pain with the vibration    Pertinent History Pt had saw  accident on 04/26/20 at [facility] and then Surgery by Dr Joice Lofts on 04/28/20 - Primary repair of right EIP and index EDC tendons.2. Irrigation and debridement with primary repair of right dorsal hand laceration (approximately 10 cm in length). Sutures were taken out on 05/10/20 and kept on dorsal splint - and then follow up again at [facility] 05/24/20 - kept in dorsal forearm splint and refer to OT/hand therapy - but with workers comp -referral to OT did not happend until 06/02/20 - pt now about 5 1/2 wks s/p    Patient Stated Goals Want to get the use of my R hand back to be able to work and take care of the animals and yard /garden at home    Currently in Pain? Yes    Pain Score 5     Pain Location Hand    Pain Orientation Right    Pain Descriptors / Indicators Tender;Tightness;Burning    Pain Type Surgical pain    Pain Onset More than a month ago    Pain Frequency Intermittent              [facility] OT Assessment - 07/22/20 0001      Strength   Right Hand Grip (lbs) 55    Right Hand Lateral Pinch 21 lbs    Right Hand 3 Point Pinch 15 lbs  Left Hand Grip (lbs) 78    Left Hand Lateral Pinch 22 lbs    Left Hand 3 Point Pinch 19 lbs      Right Hand AROM   R Index  MCP 0-90 80 Degrees    R Index PIP 0-100 90 Degrees    R Long  MCP 0-90 75 Degrees    R Long PIP 0-100 95 Degrees    R Ring  MCP 0-90 80 Degrees    R Ring PIP 0-100 100 Degrees    R Little  MCP 0-90 90 Degrees    R Little PIP 0-100 95 Degrees               Pt R hand AROM in digist flexion - able touch palm and maintain extention -  Wrist AROM WFL -and strength 5/5  Grip increase greatly as well as 3point and lat grip - see flow sheet          OT Treatments/Exercises (OP) - 07/22/20 0001      RUE Fluidotherapy   Number Minutes Fluidotherapy 10 Minutes    RUE Fluidotherapy Location Hand;Wrist    Comments AROM for wrist and digits decrease pain and stiffness          coban scar massage over dorsal scar - tenderness  mostly over 3rd MC - but proximal and distal better-  Tender, burning pain  Increase with gripping activities, vibrastion and tapping As well as repetitive gripping and functional use pain increase    composite flexion to digits  cont teal putty for gripping - 12 reps and 3 point pinch 12 reps Gripping 3 sets - 2  X day   16 oz hammer for RD,UD , sup/pro- 15 reps - pain free- and hold 1/2 way  3 sets - 2 x day   5 lbs for elbow and shoulder -12 reps- 2 sets  - but can wear if needed Benik wrist wrap if needed  cont withtable slides for composite extention of wrist and digits 20 reps  cont to increase use of R hand around house - but keep pain under 2/10 Discuss nerve pain with surgeon tomorrow  Recommend for pt to get Voltaren ointment to use over dorsal hand /3rd [facility]          OT Education - 07/22/20 2228    Education Details progress and changes HEP for desentitization    Person(s) Educated Patient    Methods Explanation;Demonstration;Tactile cues;Verbal cues;Handout            OT Short Term Goals - 07/19/20 1217      OT SHORT TERM GOAL #1   Title Pt to be independent in HEP and desentitization to tolerate scar massage and different textures    Baseline 4/10 at rest of scar - increase to 7/10  - very tender for scar massage and sensitive to soft and rougher textures - scar adhere - no hyper extention of MC 2nd and 3rd- but positive Tinel    Time 3    Period Weeks    Status On-going    Target Date 08/09/20      OT SHORT TERM GOAL #2   Title Pt to be indepenent with use of  MC block plint and dynamic splints to increase flexion in digits while maintaining extention less than -15 degree lag    Status Achieved             OT Long Term Goals - 07/19/20 1218  OT LONG TERM GOAL #1   Title Pt R hand digits flexion increase for pt to touch palm to grip ADLs objects with pain less than 3/10 and extentio lag less than -15    Baseline extention lag -20 at  2nd [facility] and PIP , -20 at PIP of 3rd - MC flexion MC 35-50 and PIP's 50-85 - pain increase 4-7/10 NOW - pt still decrease in 2nd and 3rd composite fist- but can grip objects    Time 4    Period Weeks    Status On-going    Target Date 08/16/20      OT LONG TERM GOAL #2   Title Grip strength increase to more than 50% compare to L to grip and pick up objects more than 5 lbs without symptoms    Baseline Grip in R 45, L 78 lbs, can carry 4 lbs - but increase pull with 5 lbs over dorsal hand    Time 6    Period Weeks    Status On-going    Target Date 08/30/20      OT LONG TERM GOAL #3   Title Prehension strength increase to more than 50% compare to L to cut food, open packages, use in farm with animals    Baseline Lat grip 21lbs R , L 22lbs - 3 point grip R 14 lbs L 19 lbs    Status Achieved      OT LONG TERM GOAL #4   Title Extention of 2nd and 3rd to be less than -15 lag and able to push buttons and donn gloves    Status Achieved      OT LONG TERM GOAL #5   Title Function on PRWHE improve more than 30 points    Baseline function score at eval 50/50 -  increase use but still hard using tools, gripping or holding animals, Will assess next time    Time 6    Period Weeks    Status On-going    Target Date 08/30/20                 Plan - 07/22/20 1229    Clinical Impression Statement Pt 12  weeks s/p right repair of 2nd digit EIP and EDC with irrigation and debridement.  Pt made great progress in AROM in wrist and digits flexion while maintaining extention - cna touch palm - not tight fist - but able to grip adn grasp objects- grip increase this past 3 wks from 25 to 55 lbs - 3 point and lat grip close to WNL - but pt show some increase nerve pain over 3rd MC and dorsal hand with gripping , force , vibration - burning pain with increase tightness. Tenderness with deep pressure, tapping , massage and vibrastion. Pt can push and pull doors open , grip hammer , push up from chair -and carry  about 6 lbs - but increase pain with wide grip more than 4 lbs, repetive force and motion of 3rd MC - and tenderenss- pt to see DR Poggi tomorrow. Pt limited by pain with sensary brance of the radial nerve over dorsal and 3rd MC on R hand - pt cont have what appear nerve pain over dorsal hand where surgery was - pt can cont to use wrist neoprene wrap as needed - increase functional use at home - but keep pain under 2/10  Patient continues to benefit from skilled OT services to maximize safety and independence in necessary daily tasks.  OT Occupational Profile and History Problem Focused Assessment - Including review of records relating to presenting problem    Occupational performance deficits (Please refer to evaluation for details): ADL's;IADL's;Rest and Sleep;Work;Play;Leisure;Social Participation    Body Structure / Function / Physical Skills ADL;Edema;Dexterity;Decreased knowledge of use of DME;Decreased knowledge of precautions;Flexibility;ROM;UE functional use;Scar mobility;Pain;Strength;IADL;Coordination    Rehab Potential Good    Clinical Decision Making Limited treatment options, no task modification necessary    Comorbidities Affecting Occupational Performance: None    Modification or Assistance to Complete Evaluation  No modification of tasks or assist necessary to complete eval    OT Frequency 2x / week    OT Duration 6 weeks    OT Treatment/Interventions Self-care/ADL training;Contrast Bath;Fluidtherapy;Moist Heat;Paraffin;Therapeutic exercise;Ultrasound;DME and/or AE instruction;Manual Therapy;Passive range of motion;Scar mobilization;Splinting;Patient/family education    Consulted and Agree with Plan of Care Patient           Patient will benefit from skilled therapeutic intervention in order to improve the following deficits and impairments:   Body Structure / Function / Physical Skills: ADL,Edema,Dexterity,Decreased knowledge of use of DME,Decreased knowledge of  precautions,Flexibility,ROM,UE functional use,Scar mobility,Pain,Strength,IADL,Coordination       Visit Diagnosis: Scar condition and fibrosis of skin  Pain in right hand  Stiffness of right wrist, not elsewhere classified  Muscle weakness (generalized)  Stiffness of right hand, not elsewhere classified    Problem List Patient Active Problem List   Diagnosis Date Noted  . Toothache 02/20/2020  . Headache 02/20/2020  . Smoking 02/20/2020  . Umbilical hernia, incarcerated   . Back pain 03/27/2019  . Benign hypertension 05/02/2018  . Obesity (BMI 30.0-34.9) 04/30/2018  . Healthcare maintenance 10/19/2016  . Flu vaccine need 04/20/2016  . GERD (gastroesophageal reflux disease) 04/01/2015  . Gout 04/01/2015  . Mixed hyperlipidemia 10/01/2014  . Hypothyroidism 05/21/2014    Rosalyn Gess OTR/L,CLT 07/22/2020, 10:36 PM  [facility] [address], [place], [address] Phone: [phone]   Fax:  [phone]  Name: AMBROSIO REUTER MRN: [id number] Date of Birth: Jul 22, 1965

## 2020-07-22 NOTE — Addendum Note (Signed)
Addended by: Rosalyn Gess on: 07/22/2020 10:53 PM   Modules accepted: Orders

## 2020-07-27 ENCOUNTER — Other Ambulatory Visit: Payer: Self-pay

## 2020-07-27 ENCOUNTER — Ambulatory Visit: Payer: PRIVATE HEALTH INSURANCE | Admitting: Occupational Therapy

## 2020-07-27 DIAGNOSIS — M25631 Stiffness of right wrist, not elsewhere classified: Secondary | ICD-10-CM

## 2020-07-27 DIAGNOSIS — L905 Scar conditions and fibrosis of skin: Secondary | ICD-10-CM | POA: Diagnosis not present

## 2020-07-27 DIAGNOSIS — M25641 Stiffness of right hand, not elsewhere classified: Secondary | ICD-10-CM

## 2020-07-27 DIAGNOSIS — M6281 Muscle weakness (generalized): Secondary | ICD-10-CM

## 2020-07-27 DIAGNOSIS — M79641 Pain in right hand: Secondary | ICD-10-CM
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
## 2020-07-29 ENCOUNTER — Other Ambulatory Visit: Payer: Self-pay

## 2020-07-29 ENCOUNTER — Ambulatory Visit: Payer: PRIVATE HEALTH INSURANCE | Admitting: Occupational Therapy

## 2020-07-29 DIAGNOSIS — L905 Scar conditions and fibrosis of skin: Secondary | ICD-10-CM

## 2020-07-29 DIAGNOSIS — M25641 Stiffness of right hand, not elsewhere classified: Secondary | ICD-10-CM

## 2020-07-29 DIAGNOSIS — M79641 Pain in right hand: Secondary | ICD-10-CM

## 2020-07-29 DIAGNOSIS — M6281 Muscle weakness (generalized): Secondary | ICD-10-CM

## 2020-07-29 DIAGNOSIS — M25631 Stiffness of right wrist, not elsewhere classified: Secondary | ICD-10-CM
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
## 2020-08-02 ENCOUNTER — Other Ambulatory Visit: Payer: Self-pay

## 2020-08-02 ENCOUNTER — Ambulatory Visit: Payer: PRIVATE HEALTH INSURANCE | Attending: Student | Admitting: Occupational Therapy

## 2020-08-02 DIAGNOSIS — L905 Scar conditions and fibrosis of skin: Secondary | ICD-10-CM | POA: Insufficient documentation

## 2020-08-02 DIAGNOSIS — M6281 Muscle weakness (generalized): Secondary | ICD-10-CM | POA: Insufficient documentation

## 2020-08-02 DIAGNOSIS — M25641 Stiffness of right hand, not elsewhere classified: Secondary | ICD-10-CM | POA: Insufficient documentation

## 2020-08-02 DIAGNOSIS — M25631 Stiffness of right wrist, not elsewhere classified: Secondary | ICD-10-CM

## 2020-08-02 DIAGNOSIS — M79641 Pain in right hand: Secondary | ICD-10-CM | POA: Insufficient documentation

## 2020-08-02 NOTE — Therapy (Signed)
[place] [facility] [address] [place], [address] Phone: [phone]   Fax:  [phone]  Occupational Therapy Treatment  Patient Details  Name: Douglas Bird MRN: [id number] Date of Birth: 1966-01-19 Referring Provider (OT): Dr Roland Rack   Encounter Date: 08/02/2020   OT End of Session - 08/02/20 1800    Visit Number 17    Number of Visits 25    Date for OT Re-Evaluation 08/30/20    OT Start Time 1351    OT Stop Time 1435    OT Time Calculation (min) 44 min    Activity Tolerance Patient tolerated treatment well    Behavior During Therapy [facility] for tasks assessed/performed           Past Medical History:  Diagnosis Date  . Diverticulitis 2011  . GERD (gastroesophageal reflux disease)   . Hyperlipidemia   . Hypertension    controlled on meds  . Hypothyroidism   . Thyroid disease     Past Surgical History:  Procedure Laterality Date  . CHOLECYSTECTOMY  June 2013  . COLONOSCOPY WITH PROPOFOL N/A 04/28/2019   Procedure: COLONOSCOPY WITH PROPOFOL;  Surgeon: Lin Landsman, MD;  Location: [facility];  Service: Endoscopy;  Laterality: N/A;  priority 4  . HERNIA REPAIR Bilateral    inguinal  . REPAIR EXTENSOR TENDON Right 04/28/2020   Procedure: EXPLORATION OF RIGHT DORSAL HAND LACERATION WITH REPAIR OF INDEX EXTENSOR TENDON LACERATION;  Surgeon: Corky Mull, MD;  Location: [facility];  Service: Orthopedics;  Laterality: Right;  . UMBILICAL HERNIA REPAIR N/A 08/11/2019   Procedure: HERNIA REPAIR UMBILICAL ADULT;  Surgeon: Fredirick Maudlin, MD;  Location: [facility];  Service: General;  Laterality: N/A;  RNFA    There were no vitals filed for this visit.   Subjective Assessment - 08/02/20 1758    Subjective  I was doing very good - using my hand at home and with animals about 75% - but then I tried to help my son on Sat with deck and used crowbar -and pain just got bad after trying to do 3-5 boards-  don't under stand - done gripper , putty    Pertinent History Pt had saw accident on 04/26/20 at [facility] and then Surgery by Dr Roland Rack on 04/28/20 - Primary repair of right EIP and index EDC tendons.2. Irrigation and debridement with primary repair of right dorsal hand laceration (approximately 10 cm in length). Sutures were taken out on 05/10/20 and kept on dorsal splint - and then follow up again at [facility] 05/24/20 - kept in dorsal forearm splint and refer to OT/hand therapy - but with workers comp -referral to OT did not happend until 06/02/20 - pt now about 5 1/2 wks s/p    Patient Stated Goals Want to get the use of my R hand back to be able to work and take care of the animals and yard /garden at home    Currently in Pain? Yes    Pain Score --   0-3/10 in session with flexion stretch of 2nd and 3rd digit   Pain Location Hand    Pain Orientation Right    Pain Descriptors / Indicators Aching;Tightness;Tender;Burning    Pain Type Acute pain;Surgical pain    Pain Onset More than a month ago    Pain Frequency Intermittent              [facility] OT Assessment - 08/02/20 0001  Strength   Right Hand Grip (lbs) 60               Per Dr Nicholaus Bloom note from 2 wks ago-recommended that he continue with occupational therapy and his home exercise regimen. 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 We discussed applying Voltaren gel and applying Lidoderm patches to the hand as well. He may progress in his activities as symptoms permit, but is to avoid offending activities. He may continue his present medication regimen as needed for discomfort. All of the patient's questions and concerns were answered. He can call any time with further concerns. He will follow up in 1 month for re-evaluation. He is to remain out of work during  this time.  Pt report  had increase pain trying to help son with deck -and using crowbar to pull up 3-5 boards- but had to much pain  But otherwise around the house and animals , car washing - pain better- using hand about 75% Vibration bother him  Pain increase to 4/10  But Voltaren ointment helping and pain patch at night time Also tinel and tenderness   present over 3rd MC -        OT Treatments/Exercises (OP) - 08/02/20 0001      RUE Paraffin   Number Minutes Paraffin 8 Minutes    RUE Paraffin Location Hand    Comments prior to PROM for flexion            Focus this date scar mobs and MC spreads an webspace    Review and done with pt composite flexion of 2nd and 3rd digit- pt still decrease 10 degrees for composite fist Pt needed min A - favoring into PIP more flexion than MC   weight bearing composite flexion stretch done with pt on towel -and pain increase to 4/10 - but decrease with table slides for composite digits and wrist extention   done 3 x 10 reps for flexion , extention 15 reps   Pt to cont with Hand gripper setup - 3 x12 reps- at 15lbs to cont with  Cont Teal putty 3 point pinch 3 x12 repsinto ball of putty   Tan rubber band done 3rd digit extention - - 3 x 12 reps  Upgrade to blue TB for scapula retraction , shoulder ext, horizontal ABD , and elbow extention - 15 reps  1-2  X day pain free  Simulate crowbar with Blue band and long tool - had increase pain - Pt to work on composite fist - appear composite fist - in combination with force for pull and push - maintaining tight grip increase nerve pain    cont to increase use of R hand around house - but keep pain under 2/10 Cont to useVoltaren ointment over dorsal hand /3rd MCor lidocaine pain patch per Dr Roland Rack       OT Education - 08/02/20 1800    Education Details progress and changes HEP for desentitization    Person(s) Educated Patient    Methods  Explanation;Demonstration;Tactile cues;Verbal cues;Handout    Comprehension Verbal cues required;Returned demonstration;Verbalized understanding            OT Short Term Goals - 07/19/20 1217      OT SHORT TERM GOAL #1   Title Pt to be independent in HEP and  desentitization to tolerate scar massage and different textures    Baseline 4/10 at rest of scar - increase to 7/10  - very tender for scar massage and sensitive to soft and rougher textures - scar adhere - no hyper extention of MC 2nd and 3rd- but positive Tinel    Time 3    Period Weeks    Status On-going    Target Date 08/09/20      OT SHORT TERM GOAL #2   Title Pt to be indepenent with use of  MC block plint and dynamic splints to increase flexion in digits while maintaining extention less than -15 degree lag    Status Achieved             OT Long Term Goals - 07/19/20 1218      OT LONG TERM GOAL #1   Title Pt R hand digits flexion increase for pt to touch palm to grip ADLs objects with pain less than 3/10 and extentio lag less than -15    Baseline extention lag -20 at 2nd [facility] and PIP , -20 at PIP of 3rd - MC flexion MC 35-50 and PIP's 50-85 - pain increase 4-7/10 NOW - pt still decrease in 2nd and 3rd composite fist- but can grip objects    Time 4    Period Weeks    Status On-going    Target Date 08/16/20      OT LONG TERM GOAL #2   Title Grip strength increase to more than 50% compare to L to grip and pick up objects more than 5 lbs without symptoms    Baseline Grip in R 45, L 78 lbs, can carry 4 lbs - but increase pull with 5 lbs over dorsal hand    Time 6    Period Weeks    Status On-going    Target Date 08/30/20      OT LONG TERM GOAL #3   Title Prehension strength increase to more than 50% compare to L to cut food, open packages, use in farm with animals    Baseline Lat grip 21lbs R , L 22lbs - 3 point grip R 14 lbs L 19 lbs    Status Achieved      OT LONG TERM GOAL #4   Title Extention of 2nd and 3rd to  be less than -15 lag and able to push buttons and donn gloves    Status Achieved      OT LONG TERM GOAL #5   Title Function on PRWHE improve more than 30 points    Baseline function score at eval 50/50 -  increase use but still hard using tools, gripping or holding animals, Will assess next time    Time 6    Period Weeks    Status On-going    Target Date 08/30/20                 Plan - 08/02/20 1801    Clinical Impression Statement Pt 13  weeks s/p right repair of 2nd digit EIP and EDC with irrigation and debridement.  Pt made great progress in AROM in wrist and digits flexion while maintaining extention - can touch palm - not able to make tight fist yet- flexion of 2nd and 3rd MC 70-75 degrees and composite decrease - pt grip increase to 60 lbs this past week - but appear increase nerve pain with grip in combination with effort , push or pull - focus this date on composite flexion  stretch - pt reported increase nerve pain with attempt to help son this past weekend fixing deck - still increase nerve pain over 3rd MC and dorsal hand with gripping with force , vibration - burning pain with increase tightness.Pain decrease when pt do composite extention weight bearing - table slides for digits and wrist extention -as well as heat  - with composite flexion of digits and simulating crowbar use - pain increase to 4 tbut then decrease to 1/10 again . Pt can push and pull doors open , grip hammer , push up from chair/wall pushupswith out pain  -and carry about 9 lbs. Pt limited by pain with sensary brance of the radial nerve over dorsal and 3rd MC on R hand - ? neuroma- pt to use Voltaren and or pain patch since last week  - pt can cont to use wrist neoprene wrap as needed -but cont to  increase functional use at home - but keep pain under 2/10  Patient continues to benefit from skilled OT services to maximize safety and independence in necessary daily tasks.    OT Occupational Profile and History  Problem Focused Assessment - Including review of records relating to presenting problem    Occupational performance deficits (Please refer to evaluation for details): ADL's;IADL's;Rest and Sleep;Work;Play;Leisure;Social Participation    Body Structure / Function / Physical Skills ADL;Edema;Dexterity;Decreased knowledge of use of DME;Decreased knowledge of precautions;Flexibility;ROM;UE functional use;Scar mobility;Pain;Strength;IADL;Coordination    Rehab Potential Good    Clinical Decision Making Limited treatment options, no task modification necessary    Comorbidities Affecting Occupational Performance: None    Modification or Assistance to Complete Evaluation  No modification of tasks or assist necessary to complete eval    OT Duration 6 weeks    OT Treatment/Interventions Self-care/ADL training;Contrast Bath;Fluidtherapy;Moist Heat;Paraffin;Therapeutic exercise;Ultrasound;DME and/or AE instruction;Manual Therapy;Passive range of motion;Scar mobilization;Splinting;Patient/family education    Consulted and Agree with Plan of Care Patient           Patient will benefit from skilled therapeutic intervention in order to improve the following deficits and impairments:   Body Structure / Function / Physical Skills: ADL,Edema,Dexterity,Decreased knowledge of use of DME,Decreased knowledge of precautions,Flexibility,ROM,UE functional use,Scar mobility,Pain,Strength,IADL,Coordination       Visit Diagnosis: Pain in right hand  Scar condition and fibrosis of skin  Stiffness of right wrist, not elsewhere classified  Muscle weakness (generalized)  Stiffness of right hand, not elsewhere classified    Problem List Patient Active Problem List   Diagnosis Date Noted  . Toothache 02/20/2020  . Headache 02/20/2020  . Smoking 02/20/2020  . Umbilical hernia, incarcerated   . Back pain 03/27/2019  . Benign hypertension 05/02/2018  . Obesity (BMI 30.0-34.9) 04/30/2018  . Healthcare  maintenance 10/19/2016  . Flu vaccine need 04/20/2016  . GERD (gastroesophageal reflux disease) 04/01/2015  . Gout 04/01/2015  . Mixed hyperlipidemia 10/01/2014  . Hypothyroidism 05/21/2014    Rosalyn Gess OTR/L,CLT 08/02/2020, 6:11 PM  [place] [facility] [address], [place], [address] Phone: [phone]   Fax:  [phone]  Name: EMETERIO BALKE MRN: [id number] Date of Birth: [birth year]

## 2020-08-05 ENCOUNTER — Other Ambulatory Visit: Payer: Self-pay

## 2020-08-05 ENCOUNTER — Ambulatory Visit: Payer: PRIVATE HEALTH INSURANCE | Admitting: Occupational Therapy

## 2020-08-05 DIAGNOSIS — L905 Scar conditions and fibrosis of skin: Secondary | ICD-10-CM | POA: Diagnosis not present

## 2020-08-05 DIAGNOSIS — M25631 Stiffness of right wrist, not elsewhere classified: Secondary | ICD-10-CM

## 2020-08-05 DIAGNOSIS — M6281 Muscle weakness (generalized): Secondary | ICD-10-CM

## 2020-08-05 DIAGNOSIS — M79641 Pain in right hand: Secondary | ICD-10-CM

## 2020-08-05 DIAGNOSIS — M25641 Stiffness of right hand, not elsewhere classified: Secondary | ICD-10-CM

## 2020-08-05 NOTE — Therapy (Signed)
[place] [facility] [address], [place], [address] Phone: [phone]   Fax:  [phone]  Occupational Therapy Treatment  Patient Details  Name: Douglas Bird MRN: [id number] Date of Birth: January 17, 1966 Referring Provider (OT): Dr Roland Rack   Encounter Date: 08/05/2020   OT End of Session - 08/05/20 0915    Visit Number 18    Number of Visits 25    Date for OT Re-Evaluation 08/30/20    OT Start Time 0901    OT Stop Time 0947    OT Time Calculation (min) 46 min    Activity Tolerance Patient tolerated treatment well    Behavior During Therapy [facility] for tasks assessed/performed           Past Medical History:  Diagnosis Date  . Diverticulitis 2011  . GERD (gastroesophageal reflux disease)   . Hyperlipidemia   . Hypertension    controlled on meds  . Hypothyroidism   . Thyroid disease     Past Surgical History:  Procedure Laterality Date  . CHOLECYSTECTOMY  June 2013  . COLONOSCOPY WITH PROPOFOL N/A 04/28/2019   Procedure: COLONOSCOPY WITH PROPOFOL;  Surgeon: Lin Landsman, MD;  Location: [facility];  Service: Endoscopy;  Laterality: N/A;  priority 4  . HERNIA REPAIR Bilateral    inguinal  . REPAIR EXTENSOR TENDON Right 04/28/2020   Procedure: EXPLORATION OF RIGHT DORSAL HAND LACERATION WITH REPAIR OF INDEX EXTENSOR TENDON LACERATION;  Surgeon: Corky Mull, MD;  Location: [facility];  Service: Orthopedics;  Laterality: Right;  . UMBILICAL HERNIA REPAIR N/A 08/11/2019   Procedure: HERNIA REPAIR UMBILICAL ADULT;  Surgeon: Fredirick Maudlin, MD;  Location: [facility];  Service: General;  Laterality: N/A;  RNFA    There were no vitals filed for this visit.   Subjective Assessment - 08/05/20 0914    Subjective  Doing everything you tell me - no pain -and since last time good- did try and crank my lawnmower and used the electric drill - but still some pain over the knuckle    Pertinent History Pt had saw  accident on 04/26/20 at [facility] and then Surgery by Dr Roland Rack on 04/28/20 - Primary repair of right EIP and index EDC tendons.2. Irrigation and debridement with primary repair of right dorsal hand laceration (approximately 10 cm in length). Sutures were taken out on 05/10/20 and kept on dorsal splint - and then follow up again at [facility] 05/24/20 - kept in dorsal forearm splint and refer to OT/hand therapy - but with workers comp -referral to OT did not happend until 06/02/20 - pt now about 5 1/2 wks s/p    Patient Stated Goals Want to get the use of my R hand back to be able to work and take care of the animals and yard /garden at home    Currently in Pain? No/denies              [facility] OT Assessment - 08/05/20 0001      Right Hand AROM   R Index  MCP 0-90 75 Degrees    R Index PIP 0-100 95 Degrees    R Long  MCP 0-90 80 Degrees    R Long PIP 0-100 95 Degrees                    OT Treatments/Exercises (OP) - 08/05/20 0001      RUE Paraffin   Number Minutes Paraffin 8 Minutes  RUE Paraffin Location Hand    Comments Prior PROM -           Focus this date scar mobs and MC spreads an webspace- using xtractor and mini massager with hand in fist - scar on stretch And work on scar mobs with hand in fist     Review and done with pt composite flexion of 2nd and 3rd digit- pt still decrease 10 degrees for composite fist Pt needed min A - favoring into PIP more flexion than MC   weight bearing composite flexion stretch done with pt on towel -and pain increase to 3/10 - but decrease with table slides for composite digits and wrist extention   done 3 x 10 reps for flexion , extention 15 reps   Pt to cont with Hand gripper setup - 3 x12 reps- at 15lbsto cont with Cont Teal putty 3 point pinch 3 x12 repsinto ball of putty  Tanrubber band done 3rd digit extention - - 3 x 12 reps   blue TB for scapula retraction , shoulder ext, horizontal ABD , and elbow extention - 15 reps  1-2  X day pain free  Simulate crowbar with Blue band and hammer - had increase pain - Also done cylinder tool with grip and wrist extention - 120 sec at 6 lbs- pain icnrease to 3/10  Pt to work on composite fist - appear composite fist - in combination with force for pull and push - maintaining tight grip increase nerve pain    cont to increase use of R hand around house - simulated farm activities, and work  - and assess how long it takes for pain to start Cont to useVoltaren ointment over dorsal hand /3rd MCor lidocaine pain patch per Dr Roland Rack cica scar pad for night time over scar         OT Education - 08/05/20 0915    Education Details progress and changes HEP for desentitization    Person(s) Educated Patient    Methods Explanation;Demonstration;Tactile cues;Verbal cues;Handout    Comprehension Verbal cues required;Returned demonstration;Verbalized understanding            OT Short Term Goals - 07/19/20 1217      OT SHORT TERM GOAL #1   Title Pt to be independent in HEP and desentitization to tolerate scar massage and different textures    Baseline 4/10 at rest of scar - increase to 7/10  - very tender for scar massage and sensitive to soft and rougher textures - scar adhere - no hyper extention of MC 2nd and 3rd- but positive Tinel    Time 3    Period Weeks    Status On-going    Target Date 08/09/20      OT SHORT TERM GOAL #2   Title Pt to be indepenent with use of  MC block plint and dynamic splints to increase flexion in digits while maintaining extention less than -15 degree lag    Status Achieved             OT Long Term Goals - 07/19/20 1218      OT LONG TERM GOAL #1   Title Pt R hand digits flexion increase for pt to touch palm to grip ADLs objects with pain less than 3/10 and extentio lag less than -15    Baseline extention lag -20 at 2nd [facility] and PIP , -20 at PIP of 3rd - MC flexion MC 35-50 and PIP's 50-85 - pain increase 4-7/10 NOW -  pt still decrease  in 2nd and 3rd composite fist- but can grip objects    Time 4    Period Weeks    Status On-going    Target Date 08/16/20      OT LONG TERM GOAL #2   Title Grip strength increase to more than 50% compare to L to grip and pick up objects more than 5 lbs without symptoms    Baseline Grip in R 45, L 78 lbs, can carry 4 lbs - but increase pull with 5 lbs over dorsal hand    Time 6    Period Weeks    Status On-going    Target Date 08/30/20      OT LONG TERM GOAL #3   Title Prehension strength increase to more than 50% compare to L to cut food, open packages, use in farm with animals    Baseline Lat grip 21lbs R , L 22lbs - 3 point grip R 14 lbs L 19 lbs    Status Achieved      OT LONG TERM GOAL #4   Title Extention of 2nd and 3rd to be less than -15 lag and able to push buttons and donn gloves    Status Achieved      OT LONG TERM GOAL #5   Title Function on PRWHE improve more than 30 points    Baseline function score at eval 50/50 -  increase use but still hard using tools, gripping or holding animals, Will assess next time    Time 6    Period Weeks    Status On-going    Target Date 08/30/20                 Plan - 08/05/20 0915    Clinical Impression Statement Pt 13  1/2 weeks s/p right repair of 2nd digit EIP and EDC with irrigation and debridement.  Pt made great progress in AROM in wrist and digits flexion while maintaining extention - can touch palm - not able to make tight fist yet- flexion of 2nd and 3rd MC 70-75 degrees and composite decrease - pt grip increase to 60 lbs this past week - but appear increase nerve pain with tight grip in combination with effort , push or pull - focus this date on composite flexion stretch - pt reported increase nerve pain with attempt to help son this past weekend fixing deck - still increase nerve pain over 3rd MC and dorsal hand with gripping with force , vibration - burning pain with increase tightness.Pain decrease when pt do composite  extention weight bearing - table slides for digits and wrist extention -as well as heat  . Pt can push and pull doors open , grip hammer , push up from chair/wall pushupswith out pain  -and carry about 9 lbs. Pt limited by pain with sensary brance of the radial nerve over dorsal and 3rd MC on R hand - ? neuroma- pt to use Voltaren and or pain patch since last week  - pt can cont to use wrist neoprene wrap as needed -but cont to  increase functional use at home - but keep pain under 2/10  Patient continues to benefit from skilled OT services to maximize safety and independence in necessary daily tasks.    OT Occupational Profile and History Problem Focused Assessment - Including review of records relating to presenting problem    Occupational performance deficits (Please refer to evaluation for details): ADL's;IADL's;Rest and Sleep;Work;Play;Leisure;Social Participation  Body Structure / Function / Physical Skills ADL;Edema;Dexterity;Decreased knowledge of use of DME;Decreased knowledge of precautions;Flexibility;ROM;UE functional use;Scar mobility;Pain;Strength;IADL;Coordination    Rehab Potential Good    Clinical Decision Making Limited treatment options, no task modification necessary    Comorbidities Affecting Occupational Performance: None    Modification or Assistance to Complete Evaluation  No modification of tasks or assist necessary to complete eval    OT Frequency 2x / week    OT Duration 6 weeks    OT Treatment/Interventions Self-care/ADL training;Contrast Bath;Fluidtherapy;Moist Heat;Paraffin;Therapeutic exercise;Ultrasound;DME and/or AE instruction;Manual Therapy;Passive range of motion;Scar mobilization;Splinting;Patient/family education    Consulted and Agree with Plan of Care Patient           Patient will benefit from skilled therapeutic intervention in order to improve the following deficits and impairments:   Body Structure / Function / Physical Skills:  ADL,Edema,Dexterity,Decreased knowledge of use of DME,Decreased knowledge of precautions,Flexibility,ROM,UE functional use,Scar mobility,Pain,Strength,IADL,Coordination       Visit Diagnosis: Pain in right hand  Scar condition and fibrosis of skin  Stiffness of right wrist, not elsewhere classified  Muscle weakness (generalized)  Stiffness of right hand, not elsewhere classified    Problem List Patient Active Problem List   Diagnosis Date Noted  . Toothache 02/20/2020  . Headache 02/20/2020  . Smoking 02/20/2020  . Umbilical hernia, incarcerated   . Back pain 03/27/2019  . Benign hypertension 05/02/2018  . Obesity (BMI 30.0-34.9) 04/30/2018  . Healthcare maintenance 10/19/2016  . Flu vaccine need 04/20/2016  . GERD (gastroesophageal reflux disease) 04/01/2015  . Gout 04/01/2015  . Mixed hyperlipidemia 10/01/2014  . Hypothyroidism 05/21/2014    Rosalyn Gess OTR/L,CLT 08/05/2020, 1:09 PM  [place] [facility] [address] [place], [address] Phone: [phone]   Fax:  [phone]  Name: Douglas Bird MRN: [id number] Date of Birth: 06-05-1965

## 2020-08-09 ENCOUNTER — Other Ambulatory Visit: Payer: Self-pay

## 2020-08-09 ENCOUNTER — Ambulatory Visit: Payer: PRIVATE HEALTH INSURANCE | Admitting: Occupational Therapy

## 2020-08-09 DIAGNOSIS — M6281 Muscle weakness (generalized): Secondary | ICD-10-CM

## 2020-08-09 DIAGNOSIS — L905 Scar conditions and fibrosis of skin: Secondary | ICD-10-CM

## 2020-08-09 DIAGNOSIS — M25641 Stiffness of right hand, not elsewhere classified: Secondary | ICD-10-CM

## 2020-08-09 DIAGNOSIS — M79641 Pain in right hand: Secondary | ICD-10-CM

## 2020-08-09 DIAGNOSIS — M25631 Stiffness of right wrist, not elsewhere classified: Secondary | ICD-10-CM

## 2020-08-09 NOTE — Therapy (Signed)
[name] [address] [place], [address] Phone: [phone]   Fax:  [phone]  Occupational Therapy Treatment  Patient Details  Name: Douglas Bird MRN: [id number] Date of Birth: 1965/10/30 Referring Provider (OT): Dr Roland Rack   Encounter Date: 08/09/2020   OT End of Session - 08/09/20 1010    Visit Number 19    Number of Visits 25    Date for OT Re-Evaluation 08/30/20    OT Start Time 0948    OT Stop Time 1030    OT Time Calculation (min) 42 min    Activity Tolerance Patient tolerated treatment well    Behavior During Therapy [facility] for tasks assessed/performed           Past Medical History:  Diagnosis Date  . Diverticulitis 2011  . GERD (gastroesophageal reflux disease)   . Hyperlipidemia   . Hypertension    controlled on meds  . Hypothyroidism   . Thyroid disease     Past Surgical History:  Procedure Laterality Date  . CHOLECYSTECTOMY  June 2013  . COLONOSCOPY WITH PROPOFOL N/A 04/28/2019   Procedure: COLONOSCOPY WITH PROPOFOL;  Surgeon: Lin Landsman, MD;  Location: [facility];  Service: Endoscopy;  Laterality: N/A;  priority 4  . HERNIA REPAIR Bilateral    inguinal  . REPAIR EXTENSOR TENDON Right 04/28/2020   Procedure: EXPLORATION OF RIGHT DORSAL HAND LACERATION WITH REPAIR OF INDEX EXTENSOR TENDON LACERATION;  Surgeon: Corky Mull, MD;  Location: [facility];  Service: Orthopedics;  Laterality: Right;  . UMBILICAL HERNIA REPAIR N/A 08/11/2019   Procedure: HERNIA REPAIR UMBILICAL ADULT;  Surgeon: Fredirick Maudlin, MD;  Location: [facility];  Service: General;  Laterality: N/A;  RNFA    There were no vitals filed for this visit.   Subjective Assessment - 08/09/20 1008    Subjective  I tried to do some chainsaw, nail gun , pick up plywood- and lawnmower and weed eater -and to start it -could not do it longer than 5 min - then pain shoots in my hand that I need to stop     Pertinent History Pt had saw accident on 04/26/20 at [facility] and then Surgery by Dr Roland Rack on 04/28/20 - Primary repair of right EIP and index EDC tendons.2. Irrigation and debridement with primary repair of right dorsal hand laceration (approximately 10 cm in length). Sutures were taken out on 05/10/20 and kept on dorsal splint - and then follow up again at [facility] 05/24/20 - kept in dorsal forearm splint and refer to OT/hand therapy - but with workers comp -referral to OT did not happend until 06/02/20 - pt now about 5 1/2 wks s/p    Patient Stated Goals Want to get the use of my R hand back to be able to work and take care of the animals and yard /garden at home    Currently in Pain? Yes    Pain Score 3     Pain Location Hand    Pain Orientation Right    Pain Descriptors / Indicators Aching;Burning;Tightness              [facility] OT Assessment - 08/09/20 0001      Strength   Right Hand Grip (lbs) 57    Right Hand Lateral Pinch 22 lbs    Right Hand 3 Point Pinch 19 lbs    Left Hand Grip (lbs) 95    Left Hand Lateral Pinch 22 lbs  Left Hand 3 Point Pinch 19 lbs      Right Hand AROM   R Index  MCP 0-90 80 Degrees    R Index PIP 0-100 95 Degrees    R Long  MCP 0-90 80 Degrees    R Long PIP 0-100 95 Degrees                   OT Treatments/Exercises (OP) - 08/09/20 0001      RUE Paraffin   Number Minutes Paraffin 8 Minutes    RUE Paraffin Location Hand    Comments Prior to PROM and soft tissue            Review and done with pt composite flexion of 2nd and 3rd digit- Pt needed min A - favoring into PIP more flexion than MC in past weight bearing composite flexion stretch done with pt on towel -and pain increase to 3/10 - but decrease with table slides for composite digits and wrist extention  done 3 x 10 reps for flexion , extention 15 reps  Pt to cont with Hand gripper setup -but increase to 20 lbs- 10 reps and then sustained grip 10 sec - start 5 reps increase gradually this  week  Upgrade to green med firm putty for grip , 3 point and lat grip- 12-15 reps  2 x day   Tanrubber band done 3rd digit extention - - 3 x 12 reps   increase to black TBfor scapula retraction , shoulder ext, horizontal ABD , and elbow extention - 15 reps  And simulate starting his lawnmower, chain saw- and band between 2nd and 3rd digits  1-2 X day pain free   cont to increase use of R hand around house - simulated farm activities, and work  - and assess how long it takes for pain to start Cont to useVoltaren ointment over dorsal hand /3rd MCor lidocaine pain patch per Dr Joice Lofts cica scar pad for night time over scar          OT Education - 08/09/20 1010    Education Details progress and changes HEP for desentitization    Person(s) Educated Patient    Methods Explanation;Demonstration;Tactile cues;Verbal cues;Handout    Comprehension Verbal cues required;Returned demonstration;Verbalized understanding            OT Short Term Goals - 07/19/20 1217      OT SHORT TERM GOAL #1   Title Pt to be independent in HEP and desentitization to tolerate scar massage and different textures    Baseline 4/10 at rest of scar - increase to 7/10  - very tender for scar massage and sensitive to soft and rougher textures - scar adhere - no hyper extention of MC 2nd and 3rd- but positive Tinel    Time 3    Period Weeks    Status On-going    Target Date 08/09/20      OT SHORT TERM GOAL #2   Title Pt to be indepenent with use of  MC block plint and dynamic splints to increase flexion in digits while maintaining extention less than -15 degree lag    Status Achieved             OT Long Term Goals - 07/19/20 1218      OT LONG TERM GOAL #1   Title Pt R hand digits flexion increase for pt to touch palm to grip ADLs objects with pain less than 3/10 and extentio lag less than -15  Baseline extention lag -20 at 2nd [facility] and PIP , -20 at PIP of 3rd - MC flexion MC 35-50 and PIP's  50-85 - pain increase 4-7/10 NOW - pt still decrease in 2nd and 3rd composite fist- but can grip objects    Time 4    Period Weeks    Status On-going    Target Date 08/16/20      OT LONG TERM GOAL #2   Title Grip strength increase to more than 50% compare to L to grip and pick up objects more than 5 lbs without symptoms    Baseline Grip in R 45, L 78 lbs, can carry 4 lbs - but increase pull with 5 lbs over dorsal hand    Time 6    Period Weeks    Status On-going    Target Date 08/30/20      OT LONG TERM GOAL #3   Title Prehension strength increase to more than 50% compare to L to cut food, open packages, use in farm with animals    Baseline Lat grip 21lbs R , L 22lbs - 3 point grip R 14 lbs L 19 lbs    Status Achieved      OT LONG TERM GOAL #4   Title Extention of 2nd and 3rd to be less than -15 lag and able to push buttons and donn gloves    Status Achieved      OT LONG TERM GOAL #5   Title Function on PRWHE improve more than 30 points    Baseline function score at eval 50/50 -  increase use but still hard using tools, gripping or holding animals, Will assess next time    Time 6    Period Weeks    Status On-going    Target Date 08/30/20                 Plan - 08/09/20 1010    Clinical Impression Statement Pt 14 weeks s/p right repair of 2nd digit EIP and EDC with irrigation and debridement.  Pt made great progress in AROM in wrist and digits flexion while maintaining extention - can touch palm - not able to make tight fist yet- flexion of 2nd and 3rd MC 80 degrees and composite decrease - pt grip increase to 60 lbs this past week - but appear increase nerve pain with tight grip in combination with effort , push or pull, impact, vibration, or lifting like plywood- focus this date on composite flexion stretch - still increase nerve pain over 3rd MC and dorsal hand with gripping with force , vibration - burning pain with increase tightness.Pain decrease when pt do composite  extention weight bearing - table slides for digits and wrist extention -as well as heat  . Pt can push and pull doors open , grip hammer , push up from chair/wall pushupswith out pain  -and carry about 9 lbs. Pt limited by pain with sensary brance of the radial nerve over dorsal and 3rd MC on R hand - ? neuroma- pt to use Voltaren and or pain patch since lsat 2 wks  - pt can cont to use wrist neoprene wrap as needed -but cont to  increase functional use at home, grip strength, push up  - but keep pain under 2/10  Patient continues to benefit from skilled OT services to maximize safety and independence in necessary daily tasks.    OT Occupational Profile and History Problem Focused Assessment - Including review of records relating  to presenting problem    Occupational performance deficits (Please refer to evaluation for details): ADL's;IADL's;Rest and Sleep;Work;Play;Leisure;Social Participation    Body Structure / Function / Physical Skills ADL;Edema;Dexterity;Decreased knowledge of use of DME;Decreased knowledge of precautions;Flexibility;ROM;UE functional use;Scar mobility;Pain;Strength;IADL;Coordination    Rehab Potential Good    Clinical Decision Making Limited treatment options, no task modification necessary    Comorbidities Affecting Occupational Performance: None    Modification or Assistance to Complete Evaluation  No modification of tasks or assist necessary to complete eval    OT Frequency 2x / week    OT Duration 6 weeks    OT Treatment/Interventions Self-care/ADL training;Contrast Bath;Fluidtherapy;Moist Heat;Paraffin;Therapeutic exercise;Ultrasound;DME and/or AE instruction;Manual Therapy;Passive range of motion;Scar mobilization;Splinting;Patient/family education    Consulted and Agree with Plan of Care Patient           Patient will benefit from skilled therapeutic intervention in order to improve the following deficits and impairments:   Body Structure / Function / Physical Skills:  ADL,Edema,Dexterity,Decreased knowledge of use of DME,Decreased knowledge of precautions,Flexibility,ROM,UE functional use,Scar mobility,Pain,Strength,IADL,Coordination       Visit Diagnosis: Pain in right hand  Scar condition and fibrosis of skin  Stiffness of right wrist, not elsewhere classified  Muscle weakness (generalized)  Stiffness of right hand, not elsewhere classified    Problem List Patient Active Problem List   Diagnosis Date Noted  . Toothache 02/20/2020  . Headache 02/20/2020  . Smoking 02/20/2020  . Umbilical hernia, incarcerated   . Back pain 03/27/2019  . Benign hypertension 05/02/2018  . Obesity (BMI 30.0-34.9) 04/30/2018  . Healthcare maintenance 10/19/2016  . Flu vaccine need 04/20/2016  . GERD (gastroesophageal reflux disease) 04/01/2015  . Gout 04/01/2015  . Mixed hyperlipidemia 10/01/2014  . Hypothyroidism 05/21/2014    Rosalyn Gess OTR/L,CLT 08/09/2020, 12:17 PM  [facility] [address] [place], [address] Phone: [phone]   Fax:  [phone]  Name: Douglas Bird MRN: [id number] Date of Birth: [birth year]

## 2020-08-12 ENCOUNTER — Ambulatory Visit: Payer: PRIVATE HEALTH INSURANCE | Admitting: Occupational Therapy

## 2020-08-18 ENCOUNTER — Other Ambulatory Visit: Payer: Self-pay

## 2020-08-18 ENCOUNTER — Ambulatory Visit: Payer: PRIVATE HEALTH INSURANCE | Attending: Student | Admitting: Occupational Therapy

## 2020-08-18 DIAGNOSIS — M25631 Stiffness of right wrist, not elsewhere classified: Secondary | ICD-10-CM | POA: Diagnosis present

## 2020-08-18 DIAGNOSIS — M25641 Stiffness of right hand, not elsewhere classified: Secondary | ICD-10-CM | POA: Insufficient documentation

## 2020-08-18 DIAGNOSIS — M79641 Pain in right hand: Secondary | ICD-10-CM | POA: Diagnosis not present

## 2020-08-18 DIAGNOSIS — L905 Scar conditions and fibrosis of skin: Secondary | ICD-10-CM | POA: Diagnosis present

## 2020-08-18 NOTE — Therapy (Signed)
[facility] [address], [place], [address] Phone: [phone]   Fax:  [phone]  Occupational Therapy Treatment  Patient Details  Name: Douglas Bird MRN: [id number] Date of Birth: [birth year] Referring Provider (OT): Dr Roland Rack   Encounter Date: 08/18/2020   OT End of Session - 08/18/20 1354    Visit Number 20    Number of Visits 25    Date for OT Re-Evaluation 08/30/20    OT Start Time 1300    OT Stop Time 1345    OT Time Calculation (min) 45 min    Activity Tolerance Patient tolerated treatment well    Behavior During Therapy [facility] for tasks assessed/performed           Past Medical History:  Diagnosis Date  . Diverticulitis 2011  . GERD (gastroesophageal reflux disease)   . Hyperlipidemia   . Hypertension    controlled on meds  . Hypothyroidism   . Thyroid disease     Past Surgical History:  Procedure Laterality Date  . CHOLECYSTECTOMY  June 2013  . COLONOSCOPY WITH PROPOFOL N/A 04/28/2019   Procedure: COLONOSCOPY WITH PROPOFOL;  Surgeon: Lin Landsman, MD;  Location: [place];  Service: Endoscopy;  Laterality: N/A;  priority 4  . HERNIA REPAIR Bilateral    inguinal  . REPAIR EXTENSOR TENDON Right 04/28/2020   Procedure: EXPLORATION OF RIGHT DORSAL HAND LACERATION WITH REPAIR OF INDEX EXTENSOR TENDON LACERATION;  Surgeon: Corky Mull, MD;  Location: [facility];  Service: Orthopedics;  Laterality: Right;  . UMBILICAL HERNIA REPAIR N/A 08/11/2019   Procedure: HERNIA REPAIR UMBILICAL ADULT;  Surgeon: Fredirick Maudlin, MD;  Location: [facility];  Service: General;  Laterality: N/A;  RNFA    There were no vitals filed for this visit.   Subjective Assessment - 08/18/20 1310    Subjective  I done okay but then yesterday I tried to help neighbour to cut some boards for deck - and the vibration from saw could only do 3- and the pain I am not getting better- I am just so frustrated -  there is things I want to do - but cannot    Pertinent History Pt had saw accident on 04/26/20 at [facility] and then Surgery by Dr Roland Rack on 04/28/20 - Primary repair of right EIP and index EDC tendons.2. Irrigation and debridement with primary repair of right dorsal hand laceration (approximately 10 cm in length). Sutures were taken out on 05/10/20 and kept on dorsal splint - and then follow up again at [facility] 05/24/20 - kept in dorsal forearm splint and refer to OT/hand therapy - but with workers comp -referral to OT did not happend until 06/02/20 - pt now about 5 1/2 wks s/p    Patient Stated Goals Want to get the use of my R hand back to be able to work and take care of the animals and yard /garden at home    Pain Score 3     Pain Location Hand    Pain Orientation Right    Pain Descriptors / Indicators Burning    Pain Type Acute pain    Pain Onset More than a month ago    Pain Frequency Intermittent              [facility] OT Assessment - 08/18/20 0001      AROM   Right Wrist Extension 60 Degrees    Right Wrist Flexion 90 Degrees  Right Wrist Radial Deviation 25 Degrees    Right Wrist Ulnar Deviation 35 Degrees      Strength   Right Hand Grip (lbs) 57    Right Hand Lateral Pinch 22 lbs    Right Hand 3 Point Pinch 18 lbs    Left Hand Grip (lbs) 95    Left Hand Lateral Pinch 22 lbs    Left Hand 3 Point Pinch 19 lbs      Right Hand AROM   R Index  MCP 0-90 75 Degrees    R Index PIP 0-100 90 Degrees    R Long  MCP 0-90 75 Degrees    R Long PIP 0-100 100 Degrees    R Ring PIP 0-100 75 Degrees    R Ring DIP 0-70 90 Degrees    R Little PIP 0-100 85 Degrees    R Little DIP 0-70 80 Degrees           Pt arrive with increase pain since yesterday trying to use saw - anything with grip and impact, push , pull, or force and vibration cause increase pain over dorsal hand ad 3rd MC - today since yesterday 3/10          OT Treatments/Exercises (OP) - 08/18/20 0001      RUE Paraffin   Number  Minutes Paraffin 10 Minutes    RUE Paraffin Location Hand    Comments to decrease pain          done paraffin with 2 cycles of ice inbetween - pain decrease to 1/10  But still positive Tinel and tenderness- pt to use pain patch from MD tonight and Voltaren   And in 24 to 48 hrs if pain better can cont with      composite flexion of 2nd and 3rd digit- weight bearing composite flexion stretch done with pt on towel -and pain increase to3/10 - but decrease with table slides for composite digits and wrist extention  done 3 x 10 reps for flexion , extention 15 reps   Hand gripper setup -but increase to 20 lbs- 10 reps and then sustained grip 10 sec - start 5 reps increase gradually this week   green med firm putty for grip , 3 point and lat grip- 12-15 reps  2 x day    black TBfor scapula retraction , shoulder ext, horizontal ABD , and elbow extention - 15 reps  And simulate starting his lawnmower, chain saw- and band between 2nd and 3rd digits  1-2 X day pain free   cont to increase use of R hand around house- simulated farm activities, and work - and assess how long it takes for pain to start Cont to useVoltaren ointment over dorsal hand /3rd MCor lidocaine pain patch per Dr Roland Rack cica scar pad for night time over scar       OT Education - 08/18/20 1354    Education Details progress and changes HEP for desentitization    Person(s) Educated Patient    Methods Explanation;Demonstration;Tactile cues;Verbal cues;Handout    Comprehension Verbal cues required;Returned demonstration;Verbalized understanding            OT Short Term Goals - 07/19/20 1217      OT SHORT TERM GOAL #1   Title Pt to be independent in HEP and desentitization to tolerate scar massage and different textures    Baseline 4/10 at rest of scar - increase to 7/10  - very tender for scar massage and sensitive to soft  and rougher textures - scar adhere - no hyper extention of MC 2nd and 3rd-  but positive Tinel    Time 3    Period Weeks    Status On-going    Target Date 08/09/20      OT SHORT TERM GOAL #2   Title Pt to be indepenent with use of  MC block plint and dynamic splints to increase flexion in digits while maintaining extention less than -15 degree lag    Status Achieved             OT Long Term Goals - 07/19/20 1218      OT LONG TERM GOAL #1   Title Pt R hand digits flexion increase for pt to touch palm to grip ADLs objects with pain less than 3/10 and extentio lag less than -15    Baseline extention lag -20 at 2nd [facility] and PIP , -20 at PIP of 3rd - MC flexion MC 35-50 and PIP's 50-85 - pain increase 4-7/10 NOW - pt still decrease in 2nd and 3rd composite fist- but can grip objects    Time 4    Period Weeks    Status On-going    Target Date 08/16/20      OT LONG TERM GOAL #2   Title Grip strength increase to more than 50% compare to L to grip and pick up objects more than 5 lbs without symptoms    Baseline Grip in R 45, L 78 lbs, can carry 4 lbs - but increase pull with 5 lbs over dorsal hand    Time 6    Period Weeks    Status On-going    Target Date 08/30/20      OT LONG TERM GOAL #3   Title Prehension strength increase to more than 50% compare to L to cut food, open packages, use in farm with animals    Baseline Lat grip 21lbs R , L 22lbs - 3 point grip R 14 lbs L 19 lbs    Status Achieved      OT LONG TERM GOAL #4   Title Extention of 2nd and 3rd to be less than -15 lag and able to push buttons and donn gloves    Status Achieved      OT LONG TERM GOAL #5   Title Function on PRWHE improve more than 30 points    Baseline function score at eval 50/50 -  increase use but still hard using tools, gripping or holding animals, Will assess next time    Time 6    Period Weeks    Status On-going    Target Date 08/30/20                 Plan - 08/18/20 1354    Clinical Impression Statement Pt 15 weeks s/p right repair of 2nd digit EIP and EDC  with irrigation and debridement.  Pt cont to have nerve pain with anything with vibration, grip with push , pull, or force or inpact - pain increase over dorsal hand between 2nd and 3rd MC - tender, burning , tight pain - constant - increase to 3/10 - Pt AROM and strength  in R wrist and digits flexion WFL - not composite tigh fist if pain - pt grip increase to about 60 lbs  and L is in 90's -prehension same as L hand and WNL  - Pain decrease when pt do composite extention weight bearing - table slides for digits and wrist extention -  as well as heat  . Pt can push and pull doors open , grip hammer , push up from chair/wall pushups without pain  -and carry about 10 lbs. Pt limited by pain with sensary brance of the radial nerve over dorsal hand  and  3rd MC on R hand - ? neuroma- pt to use Voltaren and or pain patch since seen the Dr last time  - pt can cont to use wrist neoprene wrap as needed -but cont to  increase functional use at home, grip strength, push up  - but keep pain under 2/10  Patient continues to benefit from skilled OT services to maximize safety and independence in necessary daily tasks.    OT Occupational Profile and History Problem Focused Assessment - Including review of records relating to presenting problem    Occupational performance deficits (Please refer to evaluation for details): ADL's;IADL's;Rest and Sleep;Work;Play;Leisure;Social Participation    Body Structure / Function / Physical Skills ADL;Edema;Dexterity;Decreased knowledge of use of DME;Decreased knowledge of precautions;Flexibility;ROM;UE functional use;Scar mobility;Pain;Strength;IADL;Coordination    Rehab Potential Good    Clinical Decision Making Limited treatment options, no task modification necessary    Comorbidities Affecting Occupational Performance: None    Modification or Assistance to Complete Evaluation  No modification of tasks or assist necessary to complete eval    OT Frequency 2x / week    OT Duration 6  weeks    OT Treatment/Interventions Self-care/ADL training;Contrast Bath;Fluidtherapy;Moist Heat;Paraffin;Therapeutic exercise;Ultrasound;DME and/or AE instruction;Manual Therapy;Passive range of motion;Scar mobilization;Splinting;Patient/family education    Consulted and Agree with Plan of Care Patient           Patient will benefit from skilled therapeutic intervention in order to improve the following deficits and impairments:   Body Structure / Function / Physical Skills: ADL,Edema,Dexterity,Decreased knowledge of use of DME,Decreased knowledge of precautions,Flexibility,ROM,UE functional use,Scar mobility,Pain,Strength,IADL,Coordination       Visit Diagnosis: Pain in right hand  Scar condition and fibrosis of skin  Stiffness of right wrist, not elsewhere classified  Stiffness of right hand, not elsewhere classified    Problem List Patient Active Problem List   Diagnosis Date Noted  . Toothache 02/20/2020  . Headache 02/20/2020  . Smoking 02/20/2020  . Umbilical hernia, incarcerated   . Back pain 03/27/2019  . Benign hypertension 05/02/2018  . Obesity (BMI 30.0-34.9) 04/30/2018  . Healthcare maintenance 10/19/2016  . Flu vaccine need 04/20/2016  . GERD (gastroesophageal reflux disease) 04/01/2015  . Gout 04/01/2015  . Mixed hyperlipidemia 10/01/2014  . Hypothyroidism 05/21/2014    Rosalyn Gess OTR/L,CLT 08/18/2020, 2:43 PM  [facility] [address] [place], [address] Phone: [phone]   Fax:  [phone]  Name: STEDMAN SUMMERVILLE MRN: [id number] Date of Birth: 1965-09-11

## 2020-08-26 ENCOUNTER — Ambulatory Visit: Payer: PRIVATE HEALTH INSURANCE | Attending: Student | Admitting: Occupational Therapy

## 2020-08-26 ENCOUNTER — Other Ambulatory Visit: Payer: Self-pay

## 2020-08-26 DIAGNOSIS — M25631 Stiffness of right wrist, not elsewhere classified: Secondary | ICD-10-CM | POA: Diagnosis present

## 2020-08-26 DIAGNOSIS — L905 Scar conditions and fibrosis of skin: Secondary | ICD-10-CM | POA: Diagnosis present

## 2020-08-26 DIAGNOSIS — M6281 Muscle weakness (generalized): Secondary | ICD-10-CM | POA: Diagnosis present

## 2020-08-26 DIAGNOSIS — M79641 Pain in right hand: Secondary | ICD-10-CM | POA: Diagnosis not present

## 2020-08-26 DIAGNOSIS — M25641 Stiffness of right hand, not elsewhere classified: Secondary | ICD-10-CM

## 2020-08-26 NOTE — Therapy (Signed)
[facility] [address] [place], [address] Phone: [phone]   Fax:  [phone]  Occupational Therapy Treatment  Patient Details  Name: Douglas Bird MRN: [id number] Date of Birth: 1965-10-14 Referring Provider (OT): Dr Roland Rack   Encounter Date: 08/26/2020   OT End of Session - 08/26/20 0732     Visit Number 21    Number of Visits 25    Date for OT Re-Evaluation 08/30/20    OT Start Time 0732    OT Stop Time 0801    OT Time Calculation (min) 29 min    Activity Tolerance Patient tolerated treatment well    Behavior During Therapy [facility] for tasks assessed/performed             Past Medical History:  Diagnosis Date   Diverticulitis 2011   GERD (gastroesophageal reflux disease)    Hyperlipidemia    Hypertension    controlled on meds   Hypothyroidism    Thyroid disease     Past Surgical History:  Procedure Laterality Date   CHOLECYSTECTOMY  June 2013   COLONOSCOPY WITH PROPOFOL N/A 04/28/2019   Procedure: COLONOSCOPY WITH PROPOFOL;  Surgeon: Lin Landsman, MD;  Location: [facility];  Service: Endoscopy;  Laterality: N/A;  priority 4   HERNIA REPAIR Bilateral    inguinal   REPAIR EXTENSOR TENDON Right 04/28/2020   Procedure: EXPLORATION OF RIGHT DORSAL HAND LACERATION WITH REPAIR OF INDEX EXTENSOR TENDON LACERATION;  Surgeon: Corky Mull, MD;  Location: [facility];  Service: Orthopedics;  Laterality: Right;   UMBILICAL HERNIA REPAIR N/A 08/11/2019   Procedure: HERNIA REPAIR UMBILICAL ADULT;  Surgeon: Fredirick Maudlin, MD;  Location: [facility];  Service: General;  Laterality: N/A;  RNFA    There were no vitals filed for this visit.   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 Irrigation and debridement with primary repair of right dorsal hand laceration (approximately 10 cm in length). Sutures were taken out on 05/10/20 and kept on dorsal splint - and then follow up again at [facility] 05/24/20 - kept in dorsal forearm splint and refer to OT/hand therapy - but with workers comp -referral to OT did not happend until 06/02/20 - pt now about 5 1/2 wks s/p    Patient Stated Goals Want to get the use of my R hand back to be able to work and take care of the animals and yard /garden at home    Currently in Pain? Yes    Pain Score 1     Pain Location Hand    Pain Orientation Right    Pain Descriptors / Indicators Burning    Pain Type Surgical pain    Pain Onset More than a month ago    Pain Frequency Intermittent                [facility] OT Assessment - 08/26/20  0001       AROM   Right Wrist Extension 60 Degrees    Right Wrist Flexion 90 Degrees    Right Wrist Radial Deviation 24 Degrees    Right Wrist Ulnar Deviation 40 Degrees      Strength   Right Hand Grip (lbs) 61    Right Hand Lateral Pinch 22 lbs    Right Hand 3 Point Pinch 18 lbs    Left Hand Grip (lbs) 95    Left Hand Lateral Pinch 28 lbs    Left Hand 3 Point Pinch 18 lbs      Right Hand AROM   R Index  MCP 0-90 80 Degrees    R Index PIP 0-100 95 Degrees    R Long  MCP 0-90 75 Degrees    R Long PIP 0-100 100 Degrees    R Ring PIP 0-100 75 Degrees    R Ring DIP 0-70 100 Degrees    R Little PIP 0-100 80 Degrees    R Little DIP 0-70 85 Degrees             Pt report cont pain over dorsal hand just proximal and distal to 2nd and 3rd MC - hyper sensitive and pain with soft , rougher texturs- tapping - making fist and  with combination of wrist flexion - during use  Any force, push, pull, repetition, vibration with grip - increase pain Pt able to carry and lift 15 lbs - did notice he collapse into some wrist flexion Pt to focus on 5 lbs weight for wrist and forearm HEP - 12 reps -pain free - 2 x day  R wrist and digits AROM about same as L  Pushup up - WNL  Strength in wrist 5-/5 Pt to see surgeon tomorrow - possible shot- will touch base with surgeon tomorrow -and await new instructions or orders                   OT Education - 08/26/20 0731     Education Details progress and changes HEP for desentitization    Person(s) Educated Patient    Methods Explanation;Demonstration;Tactile cues;Verbal cues;Handout    Comprehension Verbal cues required;Returned demonstration;Verbalized understanding              OT Short Term Goals - 07/19/20 1217       OT SHORT TERM GOAL #1   Title Pt to be independent in HEP and desentitization to tolerate scar massage and different textures    Baseline 4/10 at rest of scar - increase to 7/10  - very tender for scar massage and sensitive to soft and rougher textures - scar adhere - no hyper extention of MC 2nd and 3rd- but positive Tinel    Time 3    Period Weeks    Status On-going    Target Date 08/09/20      OT SHORT TERM GOAL #2   Title Pt to be indepenent with use of  MC block plint and dynamic splints to increase flexion in digits while maintaining extention less than -15 degree lag    Status Achieved               OT Long Term Goals - 07/19/20 1218       OT LONG TERM GOAL #1   Title Pt R hand digits flexion increase for pt to touch palm to grip ADLs objects with pain less than 3/10 and extentio lag less than -15  Baseline extention lag -20 at 2nd [facility] and PIP , -20 at PIP of 3rd - MC flexion MC 35-50 and PIP's 50-85 - pain increase 4-7/10 NOW - pt still decrease in 2nd and 3rd composite fist- but can grip objects    Time 4    Period  Weeks    Status On-going    Target Date 08/16/20      OT LONG TERM GOAL #2   Title Grip strength increase to more than 50% compare to L to grip and pick up objects more than 5 lbs without symptoms    Baseline Grip in R 45, L 78 lbs, can carry 4 lbs - but increase pull with 5 lbs over dorsal hand    Time 6    Period Weeks    Status On-going    Target Date 08/30/20      OT LONG TERM GOAL #3   Title Prehension strength increase to more than 50% compare to L to cut food, open packages, use in farm with animals    Baseline Lat grip 21lbs R , L 22lbs - 3 point grip R 14 lbs L 19 lbs    Status Achieved      OT LONG TERM GOAL #4   Title Extention of 2nd and 3rd to be less than -15 lag and able to push buttons and donn gloves    Status Achieved      OT LONG TERM GOAL #5   Title Function on PRWHE improve more than 30 points    Baseline function score at eval 50/50 -  increase use but still hard using tools, gripping or holding animals, Will assess next time    Time 6    Period Weeks    Status On-going    Target Date 08/30/20                   Plan - 08/26/20 0732     Clinical Impression Statement Pt 16 weeks s/p right repair of 2nd digit EIP and EDC with irrigation and debridement. Pt AROM in R hand and wrist about same as L - but mostly report tightness with making fist and flexion of wrist. Extention of digits and wrist do not bother him - or weight bearing. Cont to be limited by nerve pain over 2nd and 3rd MC - hyper sensitive to any textures, touch and tapping. Pt able to use hand in all ADL's but anything with grip and force , vibration, repetition or pounding bothers him. 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 Pt to see Dr Roland Rack tomorrow -will touch base with surgeon if possilbe prior to appt.  Limited by pain with sensary brance of the radial nerve over dorsal  2nd and 3rd MC and proximal  phalanges - ? neuroma- pt to used Voltaren and or pain patch since seen the Dr last time. Await new recommendations or instruction from surgeon.    OT Occupational Profile and History Problem Focused Assessment - Including review of records relating to presenting problem    Occupational performance deficits (Please refer to evaluation for details): ADL's;IADL's;Rest and Sleep;Work;Play;Leisure;Social Participation    Body Structure / Function / Physical Skills ADL;Edema;Dexterity;Decreased knowledge of use of DME;Decreased knowledge of precautions;Flexibility;ROM;UE functional use;Scar mobility;Pain;Strength;IADL;Coordination    Rehab Potential Good    Clinical Decision Making Limited treatment options, no task modification necessary    Comorbidities  Affecting Occupational Performance: None    Modification or Assistance to Complete Evaluation  No modification of tasks or assist necessary to complete eval    OT Frequency 2x / week    OT Duration 6 weeks    OT Treatment/Interventions Self-care/ADL training;Contrast Bath;Fluidtherapy;Moist Heat;Paraffin;Therapeutic exercise;Ultrasound;DME and/or AE instruction;Manual Therapy;Passive range of motion;Scar mobilization;Splinting;Patient/family education    Consulted and Agree with Plan of Care Patient             Patient will benefit from skilled therapeutic intervention in order to improve the following deficits and impairments:   Body Structure / Function / Physical Skills: ADL, Edema, Dexterity, Decreased knowledge of use of DME, Decreased knowledge of precautions, Flexibility, ROM, UE functional use, Scar mobility, Pain, Strength, IADL, Coordination       Visit Diagnosis: Pain in right hand  Scar condition and fibrosis of skin  Stiffness of right wrist, not elsewhere classified  Stiffness of right hand, not elsewhere classified  Muscle weakness (generalized)    Problem List Patient Active Problem List   Diagnosis Date Noted    Toothache 02/20/2020   Headache 02/20/2020   Smoking 16/57/9038   Umbilical hernia, incarcerated    Back pain 03/27/2019   Benign hypertension 05/02/2018   Obesity (BMI 30.0-34.9) 04/30/2018   Healthcare maintenance 10/19/2016   Flu vaccine need 04/20/2016   GERD (gastroesophageal reflux disease) 04/01/2015   Gout 04/01/2015   Mixed hyperlipidemia 10/01/2014   Hypothyroidism 05/21/2014    Rosalyn Gess OTR/L,CLT 08/26/2020, 8:13 AM  [facility] [address] [place], [address] Phone: [phone]   Fax:  [phone]  Name: Douglas Bird MRN: [id number] Date of Birth: [birth year]

## 2020-08-30 ENCOUNTER — Ambulatory Visit: Payer: PRIVATE HEALTH INSURANCE | Admitting: Occupational Therapy

## 2020-09-02 ENCOUNTER — Other Ambulatory Visit: Payer: Self-pay

## 2020-09-02 ENCOUNTER — Ambulatory Visit: Payer: PRIVATE HEALTH INSURANCE | Admitting: Occupational Therapy

## 2020-09-02 DIAGNOSIS — L905 Scar conditions and fibrosis of skin: Secondary | ICD-10-CM

## 2020-09-02 DIAGNOSIS — M25641 Stiffness of right hand, not elsewhere classified: Secondary | ICD-10-CM

## 2020-09-02 DIAGNOSIS — M25631 Stiffness of right wrist, not elsewhere classified: Secondary | ICD-10-CM

## 2020-09-02 DIAGNOSIS — M6281 Muscle weakness (generalized): Secondary | ICD-10-CM

## 2020-09-02 DIAGNOSIS — M79641 Pain in right hand: Secondary | ICD-10-CM

## 2020-09-02 NOTE — Therapy (Signed)
[facility] [address] [place], [address] Phone: [phone]   Fax:  [phone]  Occupational Therapy Treatment  Patient Details  Name: Douglas Bird MRN: [id number] Date of Birth: 04-22-1965 Referring Provider (OT): Dr Roland Rack   Encounter Date: 09/02/2020   OT End of Session - 09/02/20 1029     Visit Number 22    Number of Visits 30    Date for OT Re-Evaluation 09/30/20    OT Start Time 0900    OT Stop Time 0940    OT Time Calculation (min) 40 min    Activity Tolerance Patient tolerated treatment well    Behavior During Therapy [facility] for tasks assessed/performed             Past Medical History:  Diagnosis Date   Diverticulitis 2011   GERD (gastroesophageal reflux disease)    Hyperlipidemia    Hypertension    controlled on meds   Hypothyroidism    Thyroid disease     Past Surgical History:  Procedure Laterality Date   CHOLECYSTECTOMY  June 2013   COLONOSCOPY WITH PROPOFOL N/A 04/28/2019   Procedure: COLONOSCOPY WITH PROPOFOL;  Surgeon: Lin Landsman, MD;  Location: [facility];  Service: Endoscopy;  Laterality: N/A;  priority 4   HERNIA REPAIR Bilateral    inguinal   REPAIR EXTENSOR TENDON Right 04/28/2020   Procedure: EXPLORATION OF RIGHT DORSAL HAND LACERATION WITH REPAIR OF INDEX EXTENSOR TENDON LACERATION;  Surgeon: Corky Mull, MD;  Location: [facility];  Service: Orthopedics;  Laterality: Right;   UMBILICAL HERNIA REPAIR N/A 08/11/2019   Procedure: HERNIA REPAIR UMBILICAL ADULT;  Surgeon: Fredirick Maudlin, MD;  Location: [facility];  Service: General;  Laterality: N/A;  RNFA    There were no vitals filed for this visit.   Subjective Assessment - 09/02/20 1027     Subjective  I had the steroid shot Friday and DR Poggi told me to just rest it and don't do my exericses or force things until I see you today - so my hand feels stlill numb and stiff - because I did not do any  exercises    Pertinent History Pt had saw accident on 04/26/20 at [facility] and then Surgery by Dr Roland Rack on 04/28/20 - Primary repair of right EIP and index EDC tendons.2. Irrigation and debridement with primary repair of right dorsal hand laceration (approximately 10 cm in length). Sutures were taken out on 05/10/20 and kept on dorsal splint - and then follow up again at [facility] 05/24/20 - kept in dorsal forearm splint and refer to OT/hand therapy - but with workers comp -referral to OT did not happend until 06/02/20 - pt now about 5 1/2 wks s/p    Patient Stated Goals Want to get the use of my R hand back to be able to work and take care of the animals and yard /garden at home    Currently in Pain? Yes    Pain Score 2     Pain Location Hand    Pain Orientation Right    Pain Descriptors / Indicators Tightness;Numbness    Pain Onset More than a month ago    Pain Frequency Constant                [facility] OT Assessment - 09/02/20 0001       AROM   Right Wrist Extension 52 Degrees    Right Wrist Flexion 90 Degrees  Strength   Right Hand Grip (lbs) 62    Right Hand Lateral Pinch 23 lbs    Right Hand 3 Point Pinch 18 lbs    Left Hand Grip (lbs) 95    Left Hand Lateral Pinch 28 lbs    Left Hand 3 Point Pinch 18 lbs      Right Hand AROM   R Index  MCP 0-90 80 Degrees    R Index PIP 0-100 95 Degrees    R Long  MCP 0-90 75 Degrees    R Long PIP 0-100 95 Degrees    R Ring PIP 0-100 80 Degrees    R Ring DIP 0-70 100 Degrees    R Little PIP 0-100 80 Degrees    R Little DIP 0-70 85 Degrees             Pt return after having steroid shot for possible neuroma over 3rd MC -  Pt did not do any AROM and exercises or forceful use since last Friday  Pt to start back with tendon glides - 10 reps each with  composite flexion of 2nd and 3rd digit And then followed by table slides for composite digits and wrist extention 15 reps    Hand gripper 20 lbs- 10 reps and then sustained grip 10 sec - start  5 reps increase gradually this week  [employer] putty for grip  2-15 reps 2 x day             cont to increase use of R hand around house - simulated farm activities, and work  - and assess how long it takes for pain to start Cont to use Voltaren ointment over dorsal hand /3rd MC or lidocaine pain patch per Dr Roland Rack          OT Treatments/Exercises (OP) - 09/02/20 0001       RUE Fluidotherapy   Number Minutes Fluidotherapy 10 Minutes    RUE Fluidotherapy Location Hand;Wrist    Comments AROM for digits and wrist in all planes                    OT Education - 09/02/20 1028     Education Details changes HEP    Person(s) Educated Patient    Methods Explanation;Demonstration;Tactile cues;Verbal cues;Handout    Comprehension Verbal cues required;Returned demonstration;Verbalized understanding              OT Short Term Goals - 09/02/20 1113       OT SHORT TERM GOAL #1   Baseline 4/10 at rest of scar - increase to 7/10  - very tender for scar massage and sensitive to soft and rougher textures - scar adhere - no hyper extention of MC 2nd and 3rd- but positive Tinel    Time 4    Period Weeks    Status On-going    Target Date 09/30/20      OT SHORT TERM GOAL #2   Title Pt to be indepenent with use of  MC block plint and dynamic splints to increase flexion in digits while maintaining extention less than -15 degree lag    Status Achieved               OT Long Term Goals - 09/02/20 1114       OT LONG TERM GOAL #1   Title Pt R hand digits flexion increase for pt to touch palm to grip ADLs objects with pain less than 3/10 and  extentio lag less than -15    Baseline extention lag -20 at 2nd [facility] and PIP , -20 at PIP of 3rd - MC flexion MC 35-50 and PIP's 50-85 - pain increase 3/10 NOW -gripping tools and use them in force , hammering and vibration    Time 4    Period Weeks    Status On-going    Target Date 09/30/20      OT LONG TERM GOAL #2   Title  Grip strength increase to more than 50% compare to L to grip and pick up objects more than 5 lbs without symptoms    Baseline Grip in R 62, L 78 lbs, can carry 10 lbs    Status Achieved      OT LONG TERM GOAL #3   Title Prehension strength increase to more than 50% compare to L to cut food, open packages, use in farm with animals    Baseline Lat grip 23lbs R , L 28lbs - 3 point grip R 18 lbs L 19 lbs    Status Achieved      OT LONG TERM GOAL #4   Title Extention of 2nd and 3rd to be less than -15 lag and able to push buttons and donn gloves    Status Achieved      OT LONG TERM GOAL #5   Baseline function score at eval 50/50 -  increase use but still hard using tools, gripping or holding animals - hammering ,vibration, force    Time 4    Period Weeks    Status On-going    Target Date 09/30/20                   Plan - 09/02/20 1030     Clinical Impression Statement Pt 16 weeks s/p right repair of 2nd digit EIP and EDC with irrigation and debridement. Pt AROM in R hand and wrist about same as L -grip and prehension about same as last week- but mostly report tightness with making fist and flexion of wrist. And numbness.  Extention of digits and wrist do not bother him - or weight bearing. 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 Pt next appt with DR Poggi 8th of July    OT Occupational Profile and History Problem Focused Assessment - Including review of records relating to presenting problem    Occupational performance deficits (Please refer to evaluation for details): ADL's;IADL's;Rest and Sleep;Work;Play;Leisure;Social Participation    Body Structure / Function / Physical Skills ADL;Edema;Dexterity;Decreased  knowledge of use of DME;Decreased knowledge of precautions;Flexibility;ROM;UE functional use;Scar mobility;Pain;Strength;IADL;Coordination    Rehab Potential Good    Clinical Decision Making Limited treatment options, no task modification necessary    Comorbidities Affecting Occupational Performance: None    Modification or Assistance to Complete Evaluation  No modification of tasks or assist necessary to complete eval    OT Frequency 1x / week   2 x wk fi needed   OT Duration 4 weeks    OT Treatment/Interventions Self-care/ADL training;Contrast Bath;Fluidtherapy;Moist Heat;Paraffin;Therapeutic exercise;Ultrasound;DME and/or AE instruction;Manual Therapy;Passive range of motion;Scar mobilization;Splinting;Patient/family education    Consulted and Agree with Plan of Care Patient  Patient will benefit from skilled therapeutic intervention in order to improve the following deficits and impairments:   Body Structure / Function / Physical Skills: ADL, Edema, Dexterity, Decreased knowledge of use of DME, Decreased knowledge of precautions, Flexibility, ROM, UE functional use, Scar mobility, Pain, Strength, IADL, Coordination       Visit Diagnosis: Pain in right hand - Plan: Ot plan of care cert/re-cert  Scar condition and fibrosis of skin - Plan: Ot plan of care cert/re-cert  Stiffness of right wrist, not elsewhere classified - Plan: Ot plan of care cert/re-cert  Stiffness of right hand, not elsewhere classified - Plan: Ot plan of care cert/re-cert  Muscle weakness (generalized) - Plan: Ot plan of care cert/re-cert    Problem List Patient Active Problem List   Diagnosis Date Noted   Toothache 02/20/2020   Headache 02/20/2020   Smoking 47/18/5501   Umbilical hernia, incarcerated    Back pain 03/27/2019   Benign hypertension 05/02/2018   Obesity (BMI 30.0-34.9) 04/30/2018   Healthcare maintenance 10/19/2016   Flu vaccine need 04/20/2016   GERD (gastroesophageal  reflux disease) 04/01/2015   Gout 04/01/2015   Mixed hyperlipidemia 10/01/2014   Hypothyroidism 05/21/2014    Rosalyn Gess OTR/L,CLT 09/02/2020, 4:15 PM  [facility] [address], [place], [address] Phone: [phone]   Fax:  [phone]  Name: Douglas Bird MRN: [id number] Date of Birth: 02/07/1966

## 2020-09-08 ENCOUNTER — Ambulatory Visit: Payer: PRIVATE HEALTH INSURANCE | Admitting: Occupational Therapy

## 2020-09-08 DIAGNOSIS — M79641 Pain in right hand: Secondary | ICD-10-CM | POA: Diagnosis not present

## 2020-09-08 DIAGNOSIS — M25631 Stiffness of right wrist, not elsewhere classified: Secondary | ICD-10-CM

## 2020-09-08 DIAGNOSIS — L905 Scar conditions and fibrosis of skin: Secondary | ICD-10-CM

## 2020-09-08 DIAGNOSIS — M6281 Muscle weakness (generalized): Secondary | ICD-10-CM

## 2020-09-08 DIAGNOSIS — M25641 Stiffness of right hand, not elsewhere classified: Secondary | ICD-10-CM

## 2020-09-08 NOTE — Therapy (Signed)
[place] [facility] [address], [place], [address] Phone: [phone]   Fax:  [phone]  Occupational Therapy Treatment  Patient Details  Name: Douglas Bird MRN: [id number] Date of Birth: 12/11/1965 Referring Provider (OT): Dr Roland Rack   Encounter Date: 09/08/2020   OT End of Session - 09/08/20 1653     Visit Number 23    Number of Visits 30    Date for OT Re-Evaluation 09/30/20    OT Start Time 1636    OT Stop Time 3235    OT Time Calculation (min) 18 min    Activity Tolerance Patient tolerated treatment well    Behavior During Therapy [facility] for tasks assessed/performed             Past Medical History:  Diagnosis Date   Diverticulitis 2011   GERD (gastroesophageal reflux disease)    Hyperlipidemia    Hypertension    controlled on meds   Hypothyroidism    Thyroid disease     Past Surgical History:  Procedure Laterality Date   CHOLECYSTECTOMY  June 2013   COLONOSCOPY WITH PROPOFOL N/A 04/28/2019   Procedure: COLONOSCOPY WITH PROPOFOL;  Surgeon: Lin Landsman, MD;  Location: [place];  Service: Endoscopy;  Laterality: N/A;  priority 4   HERNIA REPAIR Bilateral    inguinal   REPAIR EXTENSOR TENDON Right 04/28/2020   Procedure: EXPLORATION OF RIGHT DORSAL HAND LACERATION WITH REPAIR OF INDEX EXTENSOR TENDON LACERATION;  Surgeon: Corky Mull, MD;  Location: [facility];  Service: Orthopedics;  Laterality: Right;   UMBILICAL HERNIA REPAIR N/A 08/11/2019   Procedure: HERNIA REPAIR UMBILICAL ADULT;  Surgeon: Fredirick Maudlin, MD;  Location: [facility];  Service: General;  Laterality: N/A;  RNFA    There were no vitals filed for this visit.   Subjective Assessment - 09/08/20 1644     Subjective  My whole hand goes numb at night time - but otherwise numbnes only over the 2nd and 3rd knuckle -and I am dong all my exercises like you told me  and my appt with Dr Roland Rack moved to late July because of  vacation    Pertinent History Pt had saw accident on 04/26/20 at [facility] and then Surgery by Dr Roland Rack on 04/28/20 - Primary repair of right EIP and index EDC tendons.2. Irrigation and debridement with primary repair of right dorsal hand laceration (approximately 10 cm in length). Sutures were taken out on 05/10/20 and kept on dorsal splint - and then follow up again at [facility] 05/24/20 - kept in dorsal forearm splint and refer to OT/hand therapy - but with workers comp -referral to OT did not happend until 06/02/20 - pt now about 5 1/2 wks s/p    Patient Stated Goals Want to get the use of my R hand back to be able to work and take care of the animals and yard /garden at home    Currently in Pain? No/denies            cont with tinel over dorsal 2nd and 3rd MC - pain with composite flexion increase from 1-8/10  Numbness over dorsal 2nd and 3rd  And then report numbness in hand at night time     [facility] OT Assessment - 09/08/20 0001       AROM   Right Wrist Extension 60 Degrees    Right Wrist Flexion 90 Degrees      Strength   Right Hand Grip (lbs)  72    Right Hand Lateral Pinch 25 lbs    Right Hand 3 Point Pinch 20 lbs    Left Hand Grip (lbs) 95    Left Hand Lateral Pinch 28 lbs    Left Hand 3 Point Pinch 18 lbs      Right Hand AROM   R Index  MCP 0-90 80 Degrees    R Index PIP 0-100 100 Degrees    R Long  MCP 0-90 80 Degrees    R Long PIP 0-100 100 Degrees    R Ring PIP 0-100 80 Degrees    R Ring DIP 0-70 100 Degrees    R Little PIP 0-100 80 Degrees    R Little DIP 0-70 90 Degrees            Pt made good progress again in flexion of MC's , increase grip by 10 lbs and prehension increase  As well wrist extention  Had steroid shot for possible neuroma over 3rd MC 2 wks ago and held off on HEP few days after that But since last week cont with changed HEP   Pt to cont with same HEP for 2 wks  tendon glides - 10 reps each with  composite flexion of 2nd and 3rd digit And then followed by  table slides for composite digits and wrist extention 15 reps    Hand gripper 20 lbs- 10 reps and then sustained grip 10 sec - start 5 reps increase gradually this week  [employer] putty for grip  2-15 reps 2 x day             cont to increase use of R hand around house - simulated farm activities, and work  - and assess how long it takes for pain to start Cont to use Voltaren ointment over dorsal hand /3rd MC or lidocaine pain patch per Dr Roland Rack  Follow up in 2 wks                    OT Education - 09/08/20 1653     Education Details progress and  HEP    Person(s) Educated Patient    Methods Explanation;Demonstration;Tactile cues;Verbal cues;Handout    Comprehension Verbal cues required;Returned demonstration;Verbalized understanding              OT Short Term Goals - 09/02/20 1113       OT SHORT TERM GOAL #1   Baseline 4/10 at rest of scar - increase to 7/10  - very tender for scar massage and sensitive to soft and rougher textures - scar adhere - no hyper extention of MC 2nd and 3rd- but positive Tinel    Time 4    Period Weeks    Status On-going    Target Date 09/30/20      OT SHORT TERM GOAL #2   Title Pt to be indepenent with use of  MC block plint and dynamic splints to increase flexion in digits while maintaining extention less than -15 degree lag    Status Achieved               OT Long Term Goals - 09/02/20 1114       OT LONG TERM GOAL #1   Title Pt R hand digits flexion increase for pt to touch palm to grip ADLs objects with pain less than 3/10 and extentio lag less than -15    Baseline extention lag -20 at 2nd [facility] and PIP , -  20 at PIP of 3rd - MC flexion MC 35-50 and PIP's 50-85 - pain increase 3/10 NOW -gripping tools and use them in force , hammering and vibration    Time 4    Period Weeks    Status On-going    Target Date 09/30/20      OT LONG TERM GOAL #2   Title Grip strength increase to more than 50% compare to L to grip  and pick up objects more than 5 lbs without symptoms    Baseline Grip in R 62, L 78 lbs, can carry 10 lbs    Status Achieved      OT LONG TERM GOAL #3   Title Prehension strength increase to more than 50% compare to L to cut food, open packages, use in farm with animals    Baseline Lat grip 23lbs R , L 28lbs - 3 point grip R 18 lbs L 19 lbs    Status Achieved      OT LONG TERM GOAL #4   Title Extention of 2nd and 3rd to be less than -15 lag and able to push buttons and donn gloves    Status Achieved      OT LONG TERM GOAL #5   Baseline function score at eval 50/50 -  increase use but still hard using tools, gripping or holding animals - hammering ,vibration, force    Time 4    Period Weeks    Status On-going    Target Date 09/30/20                   Plan - 09/08/20 1655     Clinical Impression Statement Pt 17 weeks s/p right repair of 2nd digit EIP and EDC with irrigation and debridement. Pt AROM in R hand and wrist about same as L -grip and prehension strength improve since last week and wrist extention with changes to HEP - cont with  report tightness with making fist and flexion of wrist. And numbness over 2nd and 3rd [facility] 's .  Extention of digits and wrist do not bother him - or weight bearing. 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 Pt next appt with Dr Roland Rack  changed to end of July - pt to follow up in 2 wks    OT Occupational Profile and History Problem Focused Assessment - Including review of records relating to presenting problem    Occupational performance deficits (Please refer to evaluation for details): ADL's;IADL's;Rest and Sleep;Work;Play;Leisure;Social Participation    Body Structure / Function / Physical Skills ADL;Edema;Dexterity;Decreased knowledge of use of  DME;Decreased knowledge of precautions;Flexibility;ROM;UE functional use;Scar mobility;Pain;Strength;IADL;Coordination    Rehab Potential Good    Clinical Decision Making Limited treatment options, no task modification necessary    Comorbidities Affecting Occupational Performance: None    Modification or Assistance to Complete Evaluation  No modification of tasks or assist necessary to complete eval    OT Frequency Biweekly    OT Duration 4 weeks    OT Treatment/Interventions Self-care/ADL training;Contrast Bath;Fluidtherapy;Moist Heat;Paraffin;Therapeutic exercise;Ultrasound;DME and/or AE instruction;Manual Therapy;Passive range of motion;Scar mobilization;Splinting;Patient/family education    Consulted and Agree with Plan of Care Patient             Patient will benefit from skilled  therapeutic intervention in order to improve the following deficits and impairments:   Body Structure / Function / Physical Skills: ADL, Edema, Dexterity, Decreased knowledge of use of DME, Decreased knowledge of precautions, Flexibility, ROM, UE functional use, Scar mobility, Pain, Strength, IADL, Coordination       Visit Diagnosis: Pain in right hand  Scar condition and fibrosis of skin  Stiffness of right wrist, not elsewhere classified  Stiffness of right hand, not elsewhere classified  Muscle weakness (generalized)    Problem List Patient Active Problem List   Diagnosis Date Noted   Toothache 02/20/2020   Headache 02/20/2020   Smoking 40/98/1191   Umbilical hernia, incarcerated    Back pain 03/27/2019   Benign hypertension 05/02/2018   Obesity (BMI 30.0-34.9) 04/30/2018   Healthcare maintenance 10/19/2016   Flu vaccine need 04/20/2016   GERD (gastroesophageal reflux disease) 04/01/2015   Gout 04/01/2015   Mixed hyperlipidemia 10/01/2014   Hypothyroidism 05/21/2014    Rosalyn Gess OTR/L,CLT 09/08/2020, 4:59 PM  [place] [facility] [address] [place], [address] Phone: [phone]   Fax:  [phone]  Name: Douglas Bird MRN: [id number] Date of Birth: 02-17-1966

## 2020-09-15 ENCOUNTER — Other Ambulatory Visit: Payer: Self-pay

## 2020-09-17 LAB — COMPREHENSIVE METABOLIC PANEL
ALT: 25 IU/L (ref 0–44)
AST: 27 IU/L (ref 0–40)
Albumin/Globulin Ratio: 1.8 (ref 1.2–2.2)
Albumin: 4.5 g/dL (ref 3.8–4.9)
Alkaline Phosphatase: 95 IU/L (ref 44–121)
BUN/Creatinine Ratio: 17 (ref 9–20)
BUN: 15 mg/dL (ref 6–24)
Bilirubin Total: 0.4 mg/dL (ref 0.0–1.2)
CO2: 23 mmol/L (ref 20–29)
Calcium: 9.7 mg/dL (ref 8.7–10.2)
Chloride: 102 mmol/L (ref 96–106)
Creatinine, Ser: 0.88 mg/dL (ref 0.76–1.27)
Globulin, Total: 2.5 g/dL (ref 1.5–4.5)
Glucose: 85 mg/dL (ref 65–99)
Potassium: 4.2 mmol/L (ref 3.5–5.2)
Sodium: 138 mmol/L (ref 134–144)
Total Protein: 7 g/dL (ref 6.0–8.5)
eGFR: 102 mL/min/{1.73_m2} (ref >59)

## 2020-09-17 LAB — LIPID PANEL WITH LDL/HDL RATIO
Cholesterol, Total: 238 mg/dL — ABNORMAL HIGH (ref 100–199)
HDL: 40 mg/dL (ref >39)
LDL Chol Calc (NIH): 129 mg/dL — ABNORMAL HIGH (ref 0–99)
LDL/HDL Ratio: 3.2 ratio (ref 0.0–3.6)
Triglycerides: 385 mg/dL — ABNORMAL HIGH (ref 0–149)
VLDL Cholesterol Cal: 69 mg/dL — ABNORMAL HIGH (ref 5–40)

## 2020-09-17 LAB — TSH: TSH: 4.43 u[IU]/mL (ref 0.450–4.500)

## 2020-09-22 ENCOUNTER — Ambulatory Visit: Payer: PRIVATE HEALTH INSURANCE | Attending: Student | Admitting: Occupational Therapy

## 2020-09-22 ENCOUNTER — Telehealth: Payer: Self-pay | Admitting: Pharmacist

## 2020-09-22 ENCOUNTER — Other Ambulatory Visit: Payer: Self-pay

## 2020-09-22 DIAGNOSIS — M25631 Stiffness of right wrist, not elsewhere classified: Secondary | ICD-10-CM | POA: Insufficient documentation

## 2020-09-22 DIAGNOSIS — M25641 Stiffness of right hand, not elsewhere classified: Secondary | ICD-10-CM | POA: Insufficient documentation

## 2020-09-22 DIAGNOSIS — M79641 Pain in right hand: Secondary | ICD-10-CM | POA: Insufficient documentation

## 2020-09-22 DIAGNOSIS — M6281 Muscle weakness (generalized): Secondary | ICD-10-CM | POA: Insufficient documentation

## 2020-09-22 DIAGNOSIS — L905 Scar conditions and fibrosis of skin: Secondary | ICD-10-CM | POA: Insufficient documentation

## 2020-09-22 NOTE — Therapy (Signed)
[place] [facility] [address] [place], [address] Phone: [phone]   Fax:  [phone]  Occupational Therapy Treatment  Patient Details  Name: Douglas Bird MRN: [id number] Date of Birth: 1965-08-11 Referring Provider (OT): Dr Roland Rack   Encounter Date: 09/22/2020   OT End of Session - 09/22/20 1546     Visit Number 24    Number of Visits 30    Date for OT Re-Evaluation 09/30/20    OT Start Time 1400    OT Stop Time 1433    OT Time Calculation (min) 33 min    Activity Tolerance Patient tolerated treatment well    Behavior During Therapy [facility] for tasks assessed/performed             Past Medical History:  Diagnosis Date   Diverticulitis 2011   GERD (gastroesophageal reflux disease)    Hyperlipidemia    Hypertension    controlled on meds   Hypothyroidism    Thyroid disease     Past Surgical History:  Procedure Laterality Date   CHOLECYSTECTOMY  June 2013   COLONOSCOPY WITH PROPOFOL N/A 04/28/2019   Procedure: COLONOSCOPY WITH PROPOFOL;  Surgeon: Lin Landsman, MD;  Location: [place];  Service: Endoscopy;  Laterality: N/A;  priority 4   HERNIA REPAIR Bilateral    inguinal   REPAIR EXTENSOR TENDON Right 04/28/2020   Procedure: EXPLORATION OF RIGHT DORSAL HAND LACERATION WITH REPAIR OF INDEX EXTENSOR TENDON LACERATION;  Surgeon: Corky Mull, MD;  Location: [facility];  Service: Orthopedics;  Laterality: Right;   UMBILICAL HERNIA REPAIR N/A 08/11/2019   Procedure: HERNIA REPAIR UMBILICAL ADULT;  Surgeon: Fredirick Maudlin, MD;  Location: [facility];  Service: General;  Laterality: N/A;  RNFA    There were no vitals filed for this visit.   Subjective Assessment - 09/22/20 1542     Subjective  My nerve still not happy- I do all the exercises to keep it loose and keep my strength in arm , wrist  and hand - but anything vibration , pulling , pushing cause pain - pain can increase at night to  5/10  - burning and numb feeling at night time-during day incease to 3/10 and tight when trying to make tight fist- seeing Dr Roland Rack end of month    Pertinent History Pt had saw accident on 04/26/20 at [facility] and then Surgery by Dr Roland Rack on 04/28/20 - Primary repair of right EIP and index EDC tendons.2. Irrigation and debridement with primary repair of right dorsal hand laceration (approximately 10 cm in length). Sutures were taken out on 05/10/20 and kept on dorsal splint - and then follow up again at [facility] 05/24/20 - kept in dorsal forearm splint and refer to OT/hand therapy - but with workers comp -referral to OT did not happend until 06/02/20 - pt now about 5 1/2 wks s/p    Patient Stated Goals Want to get the use of my R hand back to be able to work and take care of the animals and yard /garden at home    Currently in Pain? Yes    Pain Score 3     Pain Location Hand    Pain Orientation Right    Pain Descriptors / Indicators Tightness;Burning;Numbness    Pain Type Surgical pain    Pain Onset More than a month ago                [facility] OT Assessment -  09/22/20 0001       AROM   Right Wrist Extension 65 Degrees    Right Wrist Flexion 90 Degrees      Strength   Right Hand Grip (lbs) 71    Right Hand Lateral Pinch 26 lbs    Right Hand 3 Point Pinch 18 lbs    Left Hand Grip (lbs) 95    Left Hand Lateral Pinch 28 lbs    Left Hand 3 Point Pinch 18 lbs      Right Hand AROM   R Index  MCP 0-90 80 Degrees    R Index PIP 0-100 100 Degrees    R Long  MCP 0-90 80 Degrees    R Long PIP 0-100 100 Degrees    R Ring PIP 0-100 85 Degrees    R Ring DIP 0-70 100 Degrees    R Little PIP 0-100 90 Degrees    R Little DIP 0-70 90 Degrees            Measurements taken - see flow sheet     Pt  maintaining his progress in flexion of MC's , I Grip and prehension maintain And wrist extention increase to 65  Wrist , elbow and shoulder strength WNL on R  Had steroid shot for possible neuroma over 3rd  MC with last ortho visit   Pt to cont with same HEP tendon glides - 10 reps each with  composite flexion of 2nd and 3rd digit And then followed by table slides for composite digits and wrist extention 15 reps    Hand gripper 20 lbs- 10 reps and then sustained grip 10 sec - start 5 reps increase gradually this week  [employer] putty for grip  2-15 reps 2 x day             cont to use R hand around house - simulated farm activities, and work  - and assess how long it takes for pain to start Cont to use Voltaren ointment over dorsal hand /3rd MC or lidocaine pain patch per Dr Roland Rack  Appt with Dr Roland Rack end of month  Pt going to be out of town                  OT Education - 09/22/20 1546     Education Details progress and  HEP    Person(s) Educated Patient    Methods Explanation;Demonstration;Tactile cues;Verbal cues;Handout    Comprehension Verbal cues required;Returned demonstration;Verbalized understanding              OT Short Term Goals - 09/02/20 1113       OT SHORT TERM GOAL #1   Baseline 4/10 at rest of scar - increase to 7/10  - very tender for scar massage and sensitive to soft and rougher textures - scar adhere - no hyper extention of MC 2nd and 3rd- but positive Tinel    Time 4    Period Weeks    Status On-going    Target Date 09/30/20      OT SHORT TERM GOAL #2   Title Pt to be indepenent with use of  MC block plint and dynamic splints to increase flexion in digits while maintaining extention less than -15 degree lag    Status Achieved               OT Long Term Goals - 09/02/20 1114       OT LONG TERM GOAL #1  Title Pt R hand digits flexion increase for pt to touch palm to grip ADLs objects with pain less than 3/10 and extentio lag less than -15    Baseline extention lag -20 at 2nd [facility] and PIP , -20 at PIP of 3rd - MC flexion MC 35-50 and PIP's 50-85 - pain increase 3/10 NOW -gripping tools and use them in force , hammering and  vibration    Time 4    Period Weeks    Status On-going    Target Date 09/30/20      OT LONG TERM GOAL #2   Title Grip strength increase to more than 50% compare to L to grip and pick up objects more than 5 lbs without symptoms    Baseline Grip in R 62, L 78 lbs, can carry 10 lbs    Status Achieved      OT LONG TERM GOAL #3   Title Prehension strength increase to more than 50% compare to L to cut food, open packages, use in farm with animals    Baseline Lat grip 23lbs R , L 28lbs - 3 point grip R 18 lbs L 19 lbs    Status Achieved      OT LONG TERM GOAL #4   Title Extention of 2nd and 3rd to be less than -15 lag and able to push buttons and donn gloves    Status Achieved      OT LONG TERM GOAL #5   Baseline function score at eval 50/50 -  increase use but still hard using tools, gripping or holding animals - hammering ,vibration, force    Time 4    Period Weeks    Status On-going    Target Date 09/30/20                   Plan - 09/22/20 1547     Clinical Impression Statement Pt is about 5 months  s/p right repair of 2nd digit EIP and EDC with irrigation and debridement. Pt AROM in R hand and wrist about same as L -grip and prehension strength improve since  3-4 wks, wrist extention too with changes to HEP - cont with  reports of tightness with making fist and flexion of wrist. And burning pain, numbness and tightness over 2nd and 3rd [facility] 's .  Extention of digits and wrist do not bother him - or weight bearing. Pt had with last visit with ortho steriod shot on dorsal hand at [facility] [facility] -  Pt cont with pain with PROM for digits flexion and wrist -and tightness / numbness - Pt cont to have burning, numb ,tight pain 3-5/10 with use at home gripping, using hand with vibration, pushing and pulling. Pt next appt with Dr Roland Rack  changed to end of July.    OT Occupational Profile and History Problem Focused Assessment - Including review of records relating to presenting problem     Occupational performance deficits (Please refer to evaluation for details): ADL's;IADL's;Rest and Sleep;Work;Play;Leisure;Social Participation    Body Structure / Function / Physical Skills ADL;Edema;Dexterity;Decreased knowledge of use of DME;Decreased knowledge of precautions;Flexibility;ROM;UE functional use;Scar mobility;Pain;Strength;IADL;Coordination    Rehab Potential Good    Clinical Decision Making Limited treatment options, no task modification necessary    Comorbidities Affecting Occupational Performance: None    Modification or Assistance to Complete Evaluation  No modification of tasks or assist necessary to complete eval    OT Frequency Biweekly    OT Duration 4 weeks  OT Treatment/Interventions Self-care/ADL training;Contrast Bath;Fluidtherapy;Moist Heat;Paraffin;Therapeutic exercise;Ultrasound;DME and/or AE instruction;Manual Therapy;Passive range of motion;Scar mobilization;Splinting;Patient/family education    Consulted and Agree with Plan of Care Patient             Patient will benefit from skilled therapeutic intervention in order to improve the following deficits and impairments:   Body Structure / Function / Physical Skills: ADL, Edema, Dexterity, Decreased knowledge of use of DME, Decreased knowledge of precautions, Flexibility, ROM, UE functional use, Scar mobility, Pain, Strength, IADL, Coordination       Visit Diagnosis: Pain in right hand  Scar condition and fibrosis of skin  Stiffness of right wrist, not elsewhere classified  Stiffness of right hand, not elsewhere classified  Muscle weakness (generalized)    Problem List Patient Active Problem List   Diagnosis Date Noted   Toothache 02/20/2020   Headache 02/20/2020   Smoking 24/82/5003   Umbilical hernia, incarcerated    Back pain 03/27/2019   Benign hypertension 05/02/2018   Obesity (BMI 30.0-34.9) 04/30/2018   Healthcare maintenance 10/19/2016   Flu vaccine need 04/20/2016   GERD  (gastroesophageal reflux disease) 04/01/2015   Gout 04/01/2015   Mixed hyperlipidemia 10/01/2014   Hypothyroidism 05/21/2014    Rosalyn Gess OTR/L,CLT 09/22/2020, 4:15 PM  [place] [facility] [address], [place], [address] Phone: [phone]   Fax:  [phone]  Name: LEKENDRICK ALPERN MRN: [id number] Date of Birth: 1965-06-14

## 2020-09-22 NOTE — Telephone Encounter (Signed)
Patient approved for medication assistance at [facility] until 06/18/21, as long as eligibility criteria continues to be met.   Vonda [facility] Administrative Assistant 

## 2020-09-23 ENCOUNTER — Other Ambulatory Visit: Payer: Self-pay

## 2020-09-24 ENCOUNTER — Other Ambulatory Visit: Payer: Self-pay

## 2020-09-30 ENCOUNTER — Other Ambulatory Visit: Payer: Self-pay

## 2020-09-30 ENCOUNTER — Ambulatory Visit: Payer: Self-pay | Admitting: Gerontology

## 2020-09-30 VITALS — BP 149/91 | HR 72 | Ht 68.0 in | Wt 216.0 lb

## 2020-09-30 DIAGNOSIS — Z0000 Encounter for general adult medical examination without abnormal findings: Secondary | ICD-10-CM

## 2020-09-30 DIAGNOSIS — I10 Essential (primary) hypertension: Secondary | ICD-10-CM

## 2020-09-30 DIAGNOSIS — E782 Mixed hyperlipidemia: Secondary | ICD-10-CM

## 2020-09-30 DIAGNOSIS — E039 Hypothyroidism, unspecified: Secondary | ICD-10-CM

## 2020-09-30 MED ORDER — LEVOTHYROXINE SODIUM 150 MCG PO TABS
ORAL_TABLET | ORAL | 1 refills | Status: DC
  Filled 2020-09-30: qty 60, 60d supply, fill #0
  Filled 2020-10-22: qty 60, 60d supply, fill #1
  Filled 2020-10-26: qty 30, 30d supply, fill #1

## 2020-09-30 NOTE — Patient Instructions (Signed)
Heart-Healthy Eating Plan Heart-healthy meal planning includes: Eating less unhealthy fats. Eating more healthy fats. Making other changes in your diet. Talk with your doctor or a diet specialist (dietitian) to create an eating plan that is right for you. What is my plan? Your doctor may recommend an eating plan that includes: Total fat: ______% or less of total calories a day. Saturated fat: ______% or less of total calories a day. Cholesterol: less than _________mg a day. What are tips for following this plan? Cooking Avoid frying your food. Try to bake, boil, grill, or broil it instead. You can also reduce fat by: Removing the skin from poultry. Removing all visible fats from meats. Steaming vegetables in water or broth. Meal planning  At meals, divide your plate into four equal parts: Fill one-half of your plate with vegetables and green salads. Fill one-fourth of your plate with whole grains. Fill one-fourth of your plate with lean protein foods. Eat 4-5 servings of vegetables per day. A serving of vegetables is: 1 cup of raw or cooked vegetables. 2 cups of raw leafy greens. Eat 4-5 servings of fruit per day. A serving of fruit is: 1 medium whole fruit.  cup of dried fruit.  cup of fresh, frozen, or canned fruit.  cup of 100% fruit juice. Eat more foods that have soluble fiber. These are apples, broccoli, carrots, beans, peas, and barley. Try to get 20-30 g of fiber per day. Eat 4-5 servings of nuts, legumes, and seeds per week: 1 serving of dried beans or legumes equals  cup after being cooked. 1 serving of nuts is  cup. 1 serving of seeds equals 1 tablespoon.  General information Eat more home-cooked food. Eat less restaurant, buffet, and fast food. Limit or avoid alcohol. Limit foods that are high in starch and sugar. Avoid fried foods. Lose weight if you are overweight. Keep track of how much salt (sodium) you eat. This is important if you have high blood  pressure. Ask your doctor to tell you more about this. Try to add vegetarian meals each week. Fats Choose healthy fats. These include olive oil and canola oil, flaxseeds, walnuts, almonds, and seeds. Eat more omega-3 fats. These include salmon, mackerel, sardines, tuna, flaxseed oil, and ground flaxseeds. Try to eat fish at least 2 times each week. Check food labels. Avoid foods with trans fats or high amounts of saturated fat. Limit saturated fats. These are often found in animal products, such as meats, butter, and cream. These are also found in plant foods, such as palm oil, palm kernel oil, and coconut oil. Avoid foods with partially hydrogenated oils in them. These have trans fats. Examples are stick margarine, some tub margarines, cookies, crackers, and other baked goods. What foods can I eat? Fruits All fresh, canned (in natural juice), or frozen fruits. Vegetables Fresh or frozen vegetables (raw, steamed, roasted, or grilled). Green salads. Grains Most grains. Choose whole wheat and whole grains most of the time. Rice andpasta, including brown rice and pastas made with whole wheat. Meats and other proteins Lean, well-trimmed beef, veal, pork, and lamb. Chicken and turkey without skin. All fish and shellfish. Wild duck, rabbit, pheasant, and venison. Egg whites or low-cholesterol egg substitutes. Dried beans, peas, lentils, and tofu. Seedsand most nuts. Dairy Low-fat or nonfat cheeses, including ricotta and mozzarella. Skim or 1% milk that is liquid, powdered, or evaporated. Buttermilk that is made with low-fatmilk. Nonfat or low-fat yogurt. Fats and oils Non-hydrogenated (trans-free) margarines. Vegetable oils, including soybean, sesame,   sunflower, olive, peanut, safflower, corn, canola, and cottonseed. Salad dressings or mayonnaisemade with a vegetable oil. Beverages Mineral water. Coffee and tea. Diet carbonated beverages. Sweets and desserts Sherbet, gelatin, and fruit ice. Small  amounts of dark chocolate. Limit all sweets and desserts. Seasonings and condiments All seasonings and condiments. The items listed above may not be a complete list of foods and drinks you can eat. Contact a dietitian for more options. What foods should I avoid? Fruits Canned fruit in heavy syrup. Fruit in cream or butter sauce. Fried fruit. Limitcoconut. Vegetables Vegetables cooked in cheese, cream, or butter sauce. Fried vegetables. Grains Breads that are made with saturated or trans fats, oils, or whole milk. Croissants. Sweet rolls. Donuts. High-fat crackers,such as cheese crackers. Meats and other proteins Fatty meats, such as hot dogs, ribs, sausage, bacon, rib-eye roast or steak. High-fat deli meats, such as salami and bologna. Caviar. Domestic duck andgoose. Organ meats, such as liver. Dairy Cream, sour cream, cream cheese, and creamed cottage cheese. Whole-milk cheeses. Whole or 2% milk that is liquid, evaporated, or condensed. Whole buttermilk. Cream sauce or high-fat cheese sauce. Yogurt that is made fromwhole milk. Fats and oils Meat fat, or shortening. Cocoa butter, hydrogenated oils, palm oil, coconut oil, palm kernel oil. Solid fats and shortenings, including bacon fat, salt pork, lard, and butter. Nondairy cream substitutes. Salad dressings with cheeseor sour cream. Beverages Regular sodas and juice drinks with added sugar. Sweets and desserts Frosting. Pudding. Cookies. Cakes. Pies. Milk chocolate or white chocolate.Buttered syrups. Full-fat ice cream or ice cream drinks. The items listed above may not be a complete list of foods and drinks to avoid. Contact a dietitian for more information. Summary Heart-healthy meal planning includes eating less unhealthy fats, eating more healthy fats, and making other changes in your diet. Eat a balanced diet. This includes fruits and vegetables, low-fat or nonfat dairy, lean protein, nuts and legumes, whole grains, and heart-healthy  oils and fats. This information is not intended to replace advice given to you by your health care provider. Make sure you discuss any questions you have with your healthcare provider. Document Revised: 05/10/2017 Document Reviewed: 04/13/2017 Elsevier Patient Education  [place] [employer]. [email or web address]">  DASH Eating Plan DASH stands for Dietary Approaches to Stop Hypertension. The DASH eating plan is a healthy eating plan that has been shown to: Reduce high blood pressure (hypertension). Reduce your risk for type 2 diabetes, heart disease, and stroke. Help with weight loss. What are tips for following this plan? Reading food labels Check food labels for the amount of salt (sodium) per serving. Choose foods with less than 5 percent of the Daily Value of sodium. Generally, foods with less than 300 milligrams (mg) of sodium per serving fit into this eating plan. To find whole grains, look for the word "whole" as the first word in the ingredient list. Shopping Buy products labeled as "low-sodium" or "no salt added." Buy fresh foods. Avoid canned foods and pre-made or frozen meals. Cooking Avoid adding salt when cooking. Use salt-free seasonings or herbs instead of table salt or sea salt. Check with your health care provider or pharmacist before using salt substitutes. Do not fry foods. Cook foods using healthy methods such as baking, boiling, grilling, roasting, and broiling instead. Cook with heart-healthy oils, such as olive, canola, avocado, soybean, or sunflower oil. Meal planning  Eat a balanced diet that includes: 4 or more servings of fruits and 4 or more servings of vegetables each day. Try to fill  one-half of your plate with fruits and vegetables. 6-8 servings of whole grains each day. Less than 6 oz (170 g) of lean meat, poultry, or fish each day. A 3-oz (85-g) serving of meat is about the same size as a deck of cards. One egg  equals 1 oz (28 g). 2-3 servings of low-fat dairy each day. One serving is 1 cup (237 mL). 1 serving of nuts, seeds, or beans 5 times each week. 2-3 servings of heart-healthy fats. Healthy fats called omega-3 fatty acids are found in foods such as walnuts, flaxseeds, fortified milks, and eggs. These fats are also found in cold-water fish, such as sardines, salmon, and mackerel. Limit how much you eat of: Canned or prepackaged foods. Food that is high in trans fat, such as some fried foods. Food that is high in saturated fat, such as fatty meat. Desserts and other sweets, sugary drinks, and other foods with added sugar. Full-fat dairy products. Do not salt foods before eating. Do not eat more than 4 egg yolks a week. Try to eat at least 2 vegetarian meals a week. Eat more home-cooked food and less restaurant, buffet, and fast food.  Lifestyle When eating at a restaurant, ask that your food be prepared with less salt or no salt, if possible. If you drink alcohol: Limit how much you use to: 0-1 drink a day for women who are not pregnant. 0-2 drinks a day for men. Be aware of how much alcohol is in your drink. In the [place] one drink equals one 12 oz bottle of beer (355 mL), one 5 oz glass of wine (148 mL), or one 1 oz glass of hard liquor (44 mL). General information Avoid eating more than 2,300 mg of salt a day. If you have hypertension, you may need to reduce your sodium intake to 1,500 mg a day. Work with your health care provider to maintain a healthy body weight or to lose weight. Ask what an ideal weight is for you. Get at least 30 minutes of exercise that causes your heart to beat faster (aerobic exercise) most days of the week. Activities may include walking, swimming, or biking. Work with your health care provider or dietitian to adjust your eating plan to your individual calorie needs. What foods should I eat? Fruits All fresh, dried, or frozen fruit. Canned fruit in natural juice  (without addedsugar). Vegetables Fresh or frozen vegetables (raw, steamed, roasted, or grilled). Low-sodium or reduced-sodium tomato and vegetable juice. Low-sodium or reduced-sodium tomatosauce and tomato paste. Low-sodium or reduced-sodium canned vegetables. Grains Whole-grain or whole-wheat bread. Whole-grain or whole-wheat pasta. Brown rice. [place]. Bulgur. Whole-grain and low-sodium cereals. Pita bread.Low-fat, low-sodium crackers. Whole-wheat flour tortillas. Meats and other proteins Skinless chicken or [place]. Ground chicken or [place]. Pork with fat trimmed off. Fish and seafood. Egg whites. Dried beans, peas, or lentils. Unsalted nuts, nut butters, and seeds. Unsalted canned beans. Lean cuts of beef with fat trimmed off. Low-sodium, lean precooked or cured meat, such as sausages or meatloaves. Dairy Low-fat (1%) or fat-free (skim) milk. Reduced-fat, low-fat, or fat-free cheeses. Nonfat, low-sodium ricotta or cottage cheese. Low-fat or nonfatyogurt. Low-fat, low-sodium cheese. Fats and oils Soft margarine without trans fats. Vegetable oil. Reduced-fat, low-fat, or light mayonnaise and salad dressings (reduced-sodium). Canola, safflower, olive, avocado, soybean, andsunflower oils. Avocado. Seasonings and condiments Herbs. Spices. Seasoning mixes without salt. Other foods Unsalted popcorn and pretzels. Fat-free sweets. The items listed above may not be a complete list of foods and beverages you  can eat. Contact a dietitian for more information. What foods should I avoid? Fruits Canned fruit in a light or heavy syrup. Fried fruit. Fruit in cream or buttersauce. Vegetables Creamed or fried vegetables. Vegetables in a cheese sauce. Regular canned vegetables (not low-sodium or reduced-sodium). Regular canned tomato sauce and paste (not low-sodium or reduced-sodium). Regular tomato and vegetable juice(not low-sodium or reduced-sodium). Angie Fava. Olives. Grains Baked goods made with fat,  such as croissants, muffins, or some breads. Drypasta or rice meal packs. Meats and other proteins Fatty cuts of meat. Ribs. Fried meat. Berniece Salines. Bologna, salami, and other precooked or cured meats, such as sausages or meat loaves. Fat from the back of a pig (fatback). Bratwurst. Salted nuts and seeds. Canned beans with added salt. Canned orsmoked fish. Whole eggs or egg yolks. Chicken or [place] with skin. Dairy Whole or 2% milk, cream, and half-and-half. Whole or full-fat cream cheese. Whole-fat or sweetened yogurt. Full-fat cheese. Nondairy creamers. Whippedtoppings. Processed cheese and cheese spreads. Fats and oils Butter. Stick margarine. Lard. Shortening. Ghee. Bacon fat. Tropical oils, suchas coconut, palm kernel, or palm oil. Seasonings and condiments Onion salt, garlic salt, seasoned salt, table salt, and sea salt. Worcestershire sauce. Tartar sauce. Barbecue sauce. Teriyaki sauce. Soy sauce, including reduced-sodium. Steak sauce. Canned and packaged gravies. Fish sauce. Oyster sauce. Cocktail sauce. Store-bought horseradish. Ketchup. Mustard. Meat flavorings and tenderizers. Bouillon cubes. Hot sauces. Pre-made or packaged marinades. Pre-made or packaged taco seasonings. Relishes. Regular saladdressings. Other foods Salted popcorn and pretzels. The items listed above may not be a complete list of foods and beverages you should avoid. Contact a dietitian for more information. Where to find more information National Heart, Lung, and Blood Institute: https://wilson-eaton.com/ American Heart Association: www.heart.org Academy of Nutrition and Dietetics: www.eatright.[place]: www.kidney.org Summary The DASH eating plan is a healthy eating plan that has been shown to reduce high blood pressure (hypertension). It may also reduce your risk for type 2 diabetes, heart disease, and stroke. When on the DASH eating plan, aim to eat more fresh fruits and vegetables, whole grains, lean  proteins, low-fat dairy, and heart-healthy fats. With the DASH eating plan, you should limit salt (sodium) intake to 2,300 mg a day. If you have hypertension, you may need to reduce your sodium intake to 1,500 mg a day. Work with your health care provider or dietitian to adjust your eating plan to your individual calorie needs. This information is not intended to replace advice given to you by your health care provider. Make sure you discuss any questions you have with your healthcare provider. Document Revised: 02/07/2019 Document Reviewed: 02/07/2019 Elsevier Patient Education  2022 [employer].

## 2020-09-30 NOTE — Progress Notes (Signed)
Established Patient Office Visit  Subjective:  Patient ID: Douglas Bird, male    DOB: 1965/07/21  Age: 55 y.o. MRN: [id number]  CC:  Chief Complaint  Patient presents with   Follow-up    Brought his blood pressure log, states he quit smoking 7 months ago.    HPI Douglas Bird is a 55 year old male who has history of diverticulitis, GERD, hyperlipidemia, hypertension, hypothyroidism, presents for routine follow-up visit , lab review and medication refill. He checks his blood pressure twice weekly and his SBP 110-134 and DBP ranges between 68-81. He desnies chest pain, palpitation, dizziness, vision changes. He quit smoking 8 months ago. He adheres to DASH diet and exercises daily as tolerated. His Total cholesterol done on 09/15/20 increased from 209 to 238 mg/dl, Triglyceride increased from 147 mg/dl to 385 mg/dl, and LDL decreased from 142 to 129 mg/dl. His TSH was normal at 4.430. He hit his plantar section under right great toe on a rock while swimming on Saturday, states that he performs dressing changes and it's 85% healed.  He states that he is compliant with his medications, denies side effects, and continues to make healthy lifestyle changes.  Overall he states that he is doing well, and offers no further complaints.    Past Medical History:  Diagnosis Date   Diverticulitis 2011   GERD (gastroesophageal reflux disease)    Hyperlipidemia    Hypertension    controlled on meds   Hypothyroidism    Thyroid disease     Past Surgical History:  Procedure Laterality Date   CHOLECYSTECTOMY  June 2013   COLONOSCOPY WITH PROPOFOL N/A 04/28/2019   Procedure: COLONOSCOPY WITH PROPOFOL;  Surgeon: Lin Landsman, MD;  Location: [facility];  Service: Endoscopy;  Laterality: N/A;  priority 4   HERNIA REPAIR Bilateral    inguinal   REPAIR EXTENSOR TENDON Right 04/28/2020   Procedure: EXPLORATION OF RIGHT DORSAL HAND LACERATION WITH REPAIR OF INDEX EXTENSOR TENDON LACERATION;   Surgeon: Corky Mull, MD;  Location: [facility];  Service: Orthopedics;  Laterality: Right;   UMBILICAL HERNIA REPAIR N/A 08/11/2019   Procedure: HERNIA REPAIR UMBILICAL ADULT;  Surgeon: Fredirick Maudlin, MD;  Location: [facility];  Service: General;  Laterality: N/A;  RNFA    Family History  Problem Relation Age of Onset   Stroke Father    Gout Father    Aneurysm Father        2006   Thyroid disease Father    Epilepsy Father    Thyroid disease Sister    Diabetes Paternal Grandfather    Cancer Mother     Social History   Socioeconomic History   Marital status: Significant Other    Spouse name: Not on file   Number of children: Not on file   Years of education: Not on file   Highest education level: Not on file  Occupational History   Not on file  Tobacco Use   Smoking status: Former    Packs/day: 0.25    Years: 15.00    Pack years: 3.75    Types: Cigarettes    Quit date: 02/17/2020    Years since quitting: 0.6   Smokeless tobacco: Former   Tobacco comments:    quit 8 weeks ago  Vaping Use   Vaping Use: Never used  Substance and Sexual Activity   Alcohol use: Yes    Alcohol/week: 12.0 standard drinks    Types: 12 Cans of beer per week  Comment: 12 a week "if that"   Drug use: No   Sexual activity: Not Currently  Other Topics Concern   Not on file  Social History Narrative   Not on file   Social Determinants of Health   Financial Resource Strain: Not on file  Food Insecurity: Not on file  Transportation Needs: Not on file  Physical Activity: Unknown   Days of Exercise per Week: 7 days   Minutes of Exercise per Session: Not on file  Stress: Not on file  Social Connections: Not on file  Intimate Partner Violence: Not on file    Outpatient Medications Prior to Visit  Medication Sig Dispense Refill   hydrochlorothiazide (MICROZIDE) 12.5 MG capsule TAKE ONE CAPSULE (12.5 MG) BY MOUTH EVERY DAY 90 capsule 1   lisinopril (ZESTRIL) 20 MG tablet TAKE ONE  TABLET BY MOUTH EVERY DAY 90 tablet 1   omeprazole (PRILOSEC) 20 MG capsule TAKE ONE CAPSULE BY MOUTH EVERY DAY 90 capsule 1   sildenafil (VIAGRA) 100 MG tablet TAKE ONE TABLET BY MOUTH DAILY AS NEEDED FOR ERECTILE DYSFUNCTION 10 tablet 1   simvastatin (ZOCOR) 20 MG tablet TAKE ONE TABLET BY MOUTH EVERY DAY 90 tablet 1   ibuprofen (ADVIL) 800 MG tablet TAKE ONE TABLET BY MOUTH EVERY 8 HOURS AS NEEDED 30 tablet 0   levothyroxine (SYNTHROID) 150 MCG tablet TAKE ONE TABLET BY MOUTH EVERY DAY BEFORE BREAKFAST 90 tablet 1   Aspirin-Caffeine (BC FAST PAIN RELIEF) 845-65 MG PACK Take 1 packet by mouth daily as needed (pain). (Patient not taking: No sig reported)     cephALEXin (KEFLEX) 500 MG capsule Take 1 capsule (500 mg total) by mouth 4 (four) times daily. (Patient not taking: No sig reported) 28 capsule 0   oxyCODONE-acetaminophen (PERCOCET/ROXICET) 5-325 MG tablet Take 1 tablet by mouth every 6 (six) hours as needed for severe pain. (Patient not taking: Reported on 09/30/2020) 20 tablet 0   No facility-administered medications prior to visit.    No Known Allergies  ROS Review of Systems  Constitutional: Negative.   Eyes: Negative.   Respiratory: Negative.    Cardiovascular: Negative.   Endocrine: Negative.   Skin:  Negative for wound.  Neurological: Negative.   Psychiatric/Behavioral: Negative.       Objective:    Physical Exam HENT:     Head: Normocephalic and atraumatic.  Eyes:     Extraocular Movements: Extraocular movements intact.     Pupils: Pupils are equal, round, and reactive to light.  Cardiovascular:     Rate and Rhythm: Normal rate and regular rhythm.     Pulses: Normal pulses.     Heart sounds: Normal heart sounds.  Pulmonary:     Effort: Pulmonary effort is normal.     Breath sounds: Normal breath sounds.  Neurological:     General: No focal deficit present.     Mental Status: He is alert and oriented to person, place, and time. Mental status is at baseline.   Psychiatric:        Mood and Affect: Mood normal.        Behavior: Behavior normal.        Thought Content: Thought content normal.        Judgment: Judgment normal.    BP (!) 149/91   Pulse 72   Ht '5\' 8"'  (1.727 m)   Wt 216 lb (98 kg)   SpO2 96%   BMI 32.84 kg/m  Wt Readings from Last 3 Encounters:  09/30/20  216 lb (98 kg)  09/15/20 216 lb 9.6 oz (98.2 kg)  07/01/20 217 lb 4.8 oz (98.6 kg)   Encouraged weight loss  Health Maintenance Due  Topic Date Due   PNEUMOCOCCAL POLYSACCHARIDE VACCINE AGE 41-64 HIGH RISK  Never done   COVID-19 Vaccine (1) Never done   Pneumococcal Vaccine 9-43 Years old (1 - PCV) Never done   FOOT EXAM  Never done   OPHTHALMOLOGY EXAM  Never done   HIV Screening  Never done   Hepatitis C Screening  Never done   Zoster Vaccines- Shingrix (1 of 2) Never done   HEMOGLOBIN A1C  04/24/2020    There are no preventive care reminders to display for this patient.  Lab Results  Component Value Date   TSH 4.430 09/15/2020   Lab Results  Component Value Date   WBC 6.0 02/05/2020   HGB 14.8 02/05/2020   HCT 42.6 02/05/2020   MCV 92 02/05/2020   PLT 203 02/05/2020   Lab Results  Component Value Date   NA 138 09/15/2020   K 4.2 09/15/2020   CO2 23 09/15/2020   GLUCOSE 85 09/15/2020   BUN 15 09/15/2020   CREATININE 0.88 09/15/2020   BILITOT 0.4 09/15/2020   ALKPHOS 95 09/15/2020   AST 27 09/15/2020   ALT 25 09/15/2020   PROT 7.0 09/15/2020   ALBUMIN 4.5 09/15/2020   CALCIUM 9.7 09/15/2020   ANIONGAP 12 03/10/2017   EGFR 102 09/15/2020   Lab Results  Component Value Date   CHOL 238 (H) 09/15/2020   Lab Results  Component Value Date   HDL 40 09/15/2020   Lab Results  Component Value Date   LDLCALC 129 (H) 09/15/2020   Lab Results  Component Value Date   TRIG 385 (H) 09/15/2020   Lab Results  Component Value Date   CHOLHDL 5.0 10/23/2019   Lab Results  Component Value Date   HGBA1C 5.2 10/23/2019      Assessment & Plan:     1. Hypothyroidism, unspecified type -His TSH was euthyroid, and he will continue on current dose. - levothyroxine (SYNTHROID) 150 MCG tablet; TAKE ONE TABLET BY MOUTH EVERY DAY BEFORE BREAKFAST  Dispense: 90 tablet; Refill: 1  2. Benign hypertension -His blood pressure is not under control, his goal should be less than 140/90.  He will continue on current medication, DASH diet and exercise as tolerated.  3. Health care maintenance -Routine labs will be checked. - CBC w/Diff; Future - HgB A1c; Future  4. Mixed hyperlipidemia -The 10-year ASCVD risk score Mikey Bussing DC Jr., et al., 2013) is: 38.7%   Values used to calculate the score:     Age: 71 years     Sex: Male     Is Non-Hispanic African American: No     Diabetic: Yes     Tobacco smoker: Yes     Systolic Blood Pressure: 767 mmHg     Is BP treated: Yes     HDL Cholesterol: 40 mg/dL     Total Cholesterol: 238 mg/dL -His ASCVD risk was 38.7%, he will continue on the same medication, will recheck lipid panel and if he is ASCVD is still elevated we will change to Lipitor.  He was advised to continue on low-fat/cholesterol diet and exercise as tolerated. - Lipid panel; Future      Follow-up: Return in about 16 weeks (around 01/20/2021), or if symptoms worsen or fail to improve.    Shanan Fitzpatrick Jerold Coombe, NP

## 2020-10-01 ENCOUNTER — Other Ambulatory Visit: Payer: Self-pay

## 2020-10-04 ENCOUNTER — Other Ambulatory Visit: Payer: Self-pay

## 2020-10-22 ENCOUNTER — Other Ambulatory Visit: Payer: Self-pay

## 2020-10-25 ENCOUNTER — Other Ambulatory Visit: Payer: Self-pay | Admitting: Surgery

## 2020-10-26 ENCOUNTER — Other Ambulatory Visit: Payer: Self-pay

## 2020-11-02 ENCOUNTER — Encounter
Admission: RE | Admit: 2020-11-02 | Discharge: 2020-11-02 | Disposition: A | Discharge status: Home / Self Care | Payer: Self-pay | Source: Ambulatory Visit | Attending: Surgery | Admitting: Surgery

## 2020-11-02 ENCOUNTER — Other Ambulatory Visit: Payer: Self-pay

## 2020-11-02 NOTE — Patient Instructions (Addendum)
Your procedure is scheduled on: 11/10/2020 Report to the Registration Desk on the 1st floor of the [facility]. To find out your arrival time, please call [phone] between 1PM - 3PM on:11/09/2020  REMEMBER: Instructions that are not followed completely may result in serious medical risk, up to and including death; or upon the discretion of your surgeon and anesthesiologist your surgery may need to be rescheduled.  Do not eat food after midnight the night before surgery.  No gum chewing, lozengers or hard candies.  You may however, drink CLEAR liquids up to 2 hours before you are scheduled to arrive for your surgery. Do not drink anything within 2 hours of your scheduled arrival time.  Clear liquids include: - water  - apple juice without pulp - gatorade (not RED, PURPLE, OR BLUE) - black coffee or tea (Do NOT add milk or creamers to the coffee or tea) Do NOT drink anything that is not on this list.    In addition, your doctor has ordered for you to drink the provided  Ensure Pre-Surgery Clear Carbohydrate Drink   Drinking this carbohydrate drink up to two hours before surgery helps to reduce insulin resistance and improve patient outcomes. Please complete drinking 2 hours prior to scheduled arrival time.  TAKE THESE MEDICATIONS THE MORNING OF SURGERY WITH A SIP OF WATER:  prilosec(take one the night before and one on the morning of surgery - helps to prevent nausea after surgery.) 2.   levothyroxine 3.   simvastatin    One week prior to surgery: Stop Anti-inflammatories (NSAIDS) such as Advil, Aleve, Ibuprofen, Motrin, Naproxen, Naprosyn and Aspirin based products such as Excedrin, Goodys Powder, BC Powder. Stop ANY OVER THE COUNTER supplements until after surgery. You may however, continue to take Tylenol if needed for pain up until the day of surgery.  No Alcohol for 24 hours before or after surgery.  No Smoking including e-cigarettes for 24 hours prior to surgery.  No  chewable tobacco products for at least 6 hours prior to surgery.  No nicotine patches on the day of surgery.  Do not use any "recreational" drugs for at least a week prior to your surgery.  Please be advised that the combination of cocaine and anesthesia may have negative outcomes, up to and including death. If you test positive for cocaine, your surgery will be cancelled.  On the morning of surgery brush your teeth with toothpaste and water, you may rinse your mouth with mouthwash if you wish. Do not swallow any toothpaste or mouthwash.  Do not wear jewelry,.  Do not wear lotions, powders, or perfumes.   Do not shave body from the neck down 48 hours prior to surgery just in case you cut yourself which could leave a site for infection.  Also, freshly shaved skin may become irritated if using the CHG soap.  Contact lenses, hearing aids and dentures may not be worn into surgery.  Do not bring valuables to the hospital. [facility] is not responsible for any missing/lost belongings or valuables.   Use CHG Soap or wipes as directed on instruction sheet.-provided for you   Notify your doctor if there is any change in your medical condition (cold, fever, infection).  Wear comfortable clothing (specific to your surgery type) to the hospital.  After surgery, you can help prevent lung complications by doing breathing exercises.  Take deep breaths and cough every 1-2 hours. Your doctor may order a device called an Incentive Spirometer to help you take  deep breaths.  If you are being admitted to the hospital overnight, leave your suitcase in the car. After surgery it may be brought to your room.  If you are being discharged the day of surgery, you will not be allowed to drive home. You will need a responsible adult (18 years or older) to drive you home and stay with you that night.   If you are taking public transportation, you will need to have a responsible adult (18 years or older) with  you. Please confirm with your physician that it is acceptable to use public transportation.   Please call the [facility] at [phone] if you have any questions about these instructions.  Surgery Visitation Policy:  Patients undergoing a surgery or procedure may have one family member or support person with them as long as that person is not COVID-19 positive or experiencing its symptoms.  That person may remain in the waiting area during the procedure.  Inpatient Visitation:    Visiting hours are 7 a.m. to 8 p.m. Inpatients will be allowed two visitors daily. The visitors may change each day during the patient's stay. No visitors under the age of 36. Any visitor under the age of 25 must be accompanied by an adult. The visitor must pass COVID-19 screenings, use hand sanitizer when entering and exiting the patient's room and wear a mask at all times, including in the patient's room. Patients must also wear a mask when staff or their visitor are in the room. Masking is required regardless of vaccination status.

## 2020-11-03 ENCOUNTER — Encounter
Admission: RE | Admit: 2020-11-03 | Discharge: 2020-11-03 | Disposition: A | Discharge status: Home / Self Care | Payer: PRIVATE HEALTH INSURANCE | Source: Ambulatory Visit | Attending: Surgery | Admitting: Surgery

## 2020-11-03 DIAGNOSIS — Z01818 Encounter for other preprocedural examination: Secondary | ICD-10-CM | POA: Diagnosis present

## 2020-11-03 LAB — BASIC METABOLIC PANEL
Anion gap: 12 (ref 5–15)
BUN: 20 mg/dL (ref 6–20)
CO2: 25 mmol/L (ref 22–32)
Calcium: 9 mg/dL (ref 8.9–10.3)
Chloride: 102 mmol/L (ref 98–111)
Creatinine, Ser: 0.91 mg/dL (ref 0.61–1.24)
GFR, Estimated: >60 mL/min (ref >60)
Glucose, Bld: 122 mg/dL — ABNORMAL HIGH (ref 70–99)
Potassium: 3.5 mmol/L (ref 3.5–5.1)
Sodium: 139 mmol/L (ref 135–145)

## 2020-11-03 LAB — CBC
HCT: 42.6 % (ref 39.0–52.0)
Hemoglobin: 14.4 g/dL (ref 13.0–17.0)
MCH: 31.8 pg (ref 26.0–34.0)
MCHC: 33.8 g/dL (ref 30.0–36.0)
MCV: 94 fL (ref 80.0–100.0)
Platelets: 200 10*3/uL (ref 150–400)
RBC: 4.53 MIL/uL (ref 4.22–5.81)
RDW: 13.1 % (ref 11.5–15.5)
WBC: 6.1 10*3/uL (ref 4.0–10.5)
nRBC: 0 % (ref 0.0–0.2)

## 2020-11-10 ENCOUNTER — Ambulatory Visit: Payer: PRIVATE HEALTH INSURANCE | Admitting: Urgent Care

## 2020-11-10 ENCOUNTER — Encounter
Admission: RE | Disposition: A | Discharge status: Home / Self Care | Payer: Self-pay | Source: Home / Self Care | Attending: Surgery

## 2020-11-10 ENCOUNTER — Ambulatory Visit
Admission: RE | Admit: 2020-11-10 | Discharge: 2020-11-10 | Disposition: A | Discharge status: Home / Self Care | Payer: PRIVATE HEALTH INSURANCE | Attending: Surgery | Admitting: Surgery

## 2020-11-10 ENCOUNTER — Other Ambulatory Visit: Payer: Self-pay

## 2020-11-10 ENCOUNTER — Encounter: Payer: Self-pay | Admitting: Surgery

## 2020-11-10 DIAGNOSIS — Z79899 Other long term (current) drug therapy: Secondary | ICD-10-CM | POA: Insufficient documentation

## 2020-11-10 DIAGNOSIS — D3612 Benign neoplasm of peripheral nerves and autonomic nervous system, upper limb, including shoulder: Secondary | ICD-10-CM | POA: Diagnosis present

## 2020-11-10 DIAGNOSIS — Z8249 Family history of ischemic heart disease and other diseases of the circulatory system: Secondary | ICD-10-CM | POA: Insufficient documentation

## 2020-11-10 DIAGNOSIS — Z791 Long term (current) use of non-steroidal anti-inflammatories (NSAID): Secondary | ICD-10-CM | POA: Insufficient documentation

## 2020-11-10 DIAGNOSIS — Z823 Family history of stroke: Secondary | ICD-10-CM | POA: Insufficient documentation

## 2020-11-10 DIAGNOSIS — Z8349 Family history of other endocrine, nutritional and metabolic diseases: Secondary | ICD-10-CM | POA: Diagnosis not present

## 2020-11-10 DIAGNOSIS — F17210 Nicotine dependence, cigarettes, uncomplicated: Secondary | ICD-10-CM | POA: Diagnosis not present

## 2020-11-10 HISTORY — PX: RESECTION OF HAND NEUROMA: SHX6071

## 2020-11-10 SURGERY — EXCISION, NEUROMA, HAND
Anesthesia: General | Site: Hand | Laterality: Right

## 2020-11-10 MED ORDER — ONDANSETRON HCL 4 MG/2ML IJ SOLN
INTRAMUSCULAR | Status: DC | PRN
  Administered 2020-11-10: 4 mg via INTRAVENOUS

## 2020-11-10 MED ORDER — METOCLOPRAMIDE HCL 10 MG PO TABS: 5.0000 mg | ORAL_TABLET | Freq: Three times a day (TID) | ORAL | Status: DC | PRN

## 2020-11-10 MED ORDER — PROPOFOL 10 MG/ML IV BOLUS
INTRAVENOUS | Status: AC
  Filled 2020-11-10: qty 20

## 2020-11-10 MED ORDER — CHLORHEXIDINE GLUCONATE 0.12 % MT SOLN
OROMUCOSAL | Status: AC
  Administered 2020-11-10: 15 mL via OROMUCOSAL
  Filled 2020-11-10: qty 15

## 2020-11-10 MED ORDER — CEFAZOLIN SODIUM-DEXTROSE 2-4 GM/100ML-% IV SOLN
INTRAVENOUS | Status: AC
  Filled 2020-11-10: qty 100

## 2020-11-10 MED ORDER — ONDANSETRON HCL 4 MG/2ML IJ SOLN
INTRAMUSCULAR | Status: AC
  Filled 2020-11-10: qty 2

## 2020-11-10 MED ORDER — 0.9 % SODIUM CHLORIDE (POUR BTL) OPTIME
TOPICAL | Status: DC | PRN
  Administered 2020-11-10: 500 mL

## 2020-11-10 MED ORDER — DEXAMETHASONE SODIUM PHOSPHATE 10 MG/ML IJ SOLN
INTRAMUSCULAR | Status: DC | PRN
  Administered 2020-11-10: 5 mg via INTRAVENOUS

## 2020-11-10 MED ORDER — FENTANYL CITRATE (PF) 100 MCG/2ML IJ SOLN: 25.0000 ug | INTRAMUSCULAR | Status: DC | PRN

## 2020-11-10 MED ORDER — ACETAMINOPHEN 10 MG/ML IV SOLN
INTRAVENOUS | Status: AC
  Filled 2020-11-10: qty 100

## 2020-11-10 MED ORDER — CEFAZOLIN SODIUM-DEXTROSE 2-4 GM/100ML-% IV SOLN
2.0000 g | INTRAVENOUS | Status: AC
  Administered 2020-11-10: 2 g via INTRAVENOUS

## 2020-11-10 MED ORDER — NEOMYCIN-POLYMYXIN B GU 40-200000 IR SOLN
Status: DC | PRN
  Administered 2020-11-10: 2 mL

## 2020-11-10 MED ORDER — PHENYLEPHRINE HCL (PRESSORS) 10 MG/ML IV SOLN
INTRAVENOUS | Status: AC
  Filled 2020-11-10: qty 1

## 2020-11-10 MED ORDER — PROMETHAZINE HCL 25 MG/ML IJ SOLN
INTRAMUSCULAR | Status: AC
  Filled 2020-11-10: qty 1

## 2020-11-10 MED ORDER — MIDAZOLAM HCL 2 MG/2ML IJ SOLN
INTRAMUSCULAR | Status: AC
  Filled 2020-11-10: qty 2

## 2020-11-10 MED ORDER — LACTATED RINGERS IV SOLN
INTRAVENOUS | Status: DC
Start: 2020-11-10 — End: 2020-11-10

## 2020-11-10 MED ORDER — FENTANYL CITRATE (PF) 100 MCG/2ML IJ SOLN
INTRAMUSCULAR | Status: AC
  Filled 2020-11-10: qty 2

## 2020-11-10 MED ORDER — PROMETHAZINE HCL 25 MG/ML IJ SOLN
6.2500 mg | INTRAMUSCULAR | Status: DC | PRN
  Administered 2020-11-10: 6.25 mg via INTRAVENOUS

## 2020-11-10 MED ORDER — BUPIVACAINE HCL (PF) 0.5 % IJ SOLN
INTRAMUSCULAR | Status: AC
  Filled 2020-11-10: qty 120

## 2020-11-10 MED ORDER — HYDROCODONE-ACETAMINOPHEN 5-325 MG PO TABS: 1.0000 | ORAL_TABLET | Freq: Four times a day (QID) | ORAL | 0 refills | Status: DC | PRN

## 2020-11-10 MED ORDER — FENTANYL CITRATE (PF) 100 MCG/2ML IJ SOLN
INTRAMUSCULAR | Status: DC | PRN
  Administered 2020-11-10 (×2): 25 ug via INTRAVENOUS

## 2020-11-10 MED ORDER — HYDROCODONE-ACETAMINOPHEN 5-325 MG PO TABS: 1.0000 | ORAL_TABLET | ORAL | Status: DC | PRN

## 2020-11-10 MED ORDER — MIDAZOLAM HCL 2 MG/2ML IJ SOLN
INTRAMUSCULAR | Status: DC | PRN
  Administered 2020-11-10: 2 mg via INTRAVENOUS

## 2020-11-10 MED ORDER — NEOMYCIN-POLYMYXIN B GU 40-200000 IR SOLN
Status: AC
  Filled 2020-11-10: qty 2

## 2020-11-10 MED ORDER — BUPIVACAINE-EPINEPHRINE (PF) 0.25% -1:200000 IJ SOLN
INTRAMUSCULAR | Status: AC
  Filled 2020-11-10: qty 60

## 2020-11-10 MED ORDER — TRIAMCINOLONE ACETONIDE 40 MG/ML IJ SUSP
INTRAMUSCULAR | Status: AC
  Filled 2020-11-10: qty 2

## 2020-11-10 MED ORDER — POTASSIUM CHLORIDE IN NACL 20-0.9 MEQ/L-% IV SOLN
INTRAVENOUS | Status: DC
  Filled 2020-11-10 (×3): qty 1000

## 2020-11-10 MED ORDER — SODIUM CHLORIDE FLUSH 0.9 % IV SOLN
INTRAVENOUS | Status: AC
  Filled 2020-11-10: qty 10

## 2020-11-10 MED ORDER — CHLORHEXIDINE GLUCONATE 0.12 % MT SOLN: 15.0000 mL | Freq: Once | OROMUCOSAL | Status: AC

## 2020-11-10 MED ORDER — ORAL CARE MOUTH RINSE: 15.0000 mL | Freq: Once | OROMUCOSAL | Status: AC

## 2020-11-10 MED ORDER — ONDANSETRON HCL 4 MG PO TABS: 4.0000 mg | ORAL_TABLET | Freq: Four times a day (QID) | ORAL | Status: DC | PRN

## 2020-11-10 MED ORDER — ACETAMINOPHEN 10 MG/ML IV SOLN
INTRAVENOUS | Status: DC | PRN
Start: 2020-11-10 — End: 2020-11-10
  Administered 2020-11-10: 1000 mg via INTRAVENOUS

## 2020-11-10 MED ORDER — ONDANSETRON HCL 4 MG/2ML IJ SOLN: 4.0000 mg | Freq: Four times a day (QID) | INTRAMUSCULAR | Status: DC | PRN

## 2020-11-10 MED ORDER — METOCLOPRAMIDE HCL 5 MG/ML IJ SOLN: 5.0000 mg | Freq: Three times a day (TID) | INTRAMUSCULAR | Status: DC | PRN

## 2020-11-10 MED ORDER — BUPIVACAINE HCL (PF) 0.5 % IJ SOLN
INTRAMUSCULAR | Status: DC | PRN
  Administered 2020-11-10: 5 mL

## 2020-11-10 MED ORDER — PROPOFOL 10 MG/ML IV BOLUS
INTRAVENOUS | Status: DC | PRN
  Administered 2020-11-10: 200 mg via INTRAVENOUS

## 2020-11-10 SURGICAL SUPPLY — 35 items
APL PRP STRL LF DISP 70% ISPRP (MISCELLANEOUS) ×1
BNDG CMPR STD VLCR NS LF 5.8X4 (GAUZE/BANDAGES/DRESSINGS) ×1
BNDG ELASTIC 4X5.8 VLCR NS LF (GAUZE/BANDAGES/DRESSINGS) ×2 IMPLANT
BNDG ESMARK 4X12 TAN STRL LF (GAUZE/BANDAGES/DRESSINGS) ×2 IMPLANT
CANISTER SUCT 1200ML W/VALVE (MISCELLANEOUS) IMPLANT
CHLORAPREP W/TINT 26 (MISCELLANEOUS) ×2 IMPLANT
CORD BIP STRL DISP 12FT (MISCELLANEOUS) ×2 IMPLANT
CUFF TOURN SGL QUICK 24 (TOURNIQUET CUFF)
CUFF TRNQT CYL 24X4X16.5-23 (TOURNIQUET CUFF) IMPLANT
DRAPE U-SHAPE 47X51 STRL (DRAPES) ×2 IMPLANT
ELECT REM PT RETURN 9FT ADLT (ELECTROSURGICAL)
ELECTRODE REM PT RTRN 9FT ADLT (ELECTROSURGICAL) IMPLANT
FORCEPS JEWEL BIP 4-3/4 STR (INSTRUMENTS) ×2 IMPLANT
GAUZE 4X4 16PLY ~~LOC~~+RFID DBL (SPONGE) ×2 IMPLANT
GAUZE SPONGE 4X4 12PLY STRL (GAUZE/BANDAGES/DRESSINGS) ×2 IMPLANT
GAUZE XEROFORM 1X8 LF (GAUZE/BANDAGES/DRESSINGS) ×2 IMPLANT
GLOVE SRG 8 PF TXTR STRL LF DI (GLOVE) ×1 IMPLANT
GLOVE SURG ENC MOIS LTX SZ8 (GLOVE) ×4 IMPLANT
GLOVE SURG UNDER POLY LF SZ8 (GLOVE) ×2
GOWN STRL REUS W/ TWL LRG LVL3 (GOWN DISPOSABLE) ×2 IMPLANT
GOWN STRL REUS W/TWL LRG LVL3 (GOWN DISPOSABLE) ×4
KIT TURNOVER KIT A (KITS) ×2 IMPLANT
LABEL OR SOLS (LABEL) ×2 IMPLANT
MANIFOLD NEPTUNE II (INSTRUMENTS) ×2 IMPLANT
NEEDLE FILTER BLUNT 18X 1/2SAF (NEEDLE) ×1
NEEDLE FILTER BLUNT 18X1 1/2 (NEEDLE) ×1 IMPLANT
NEEDLE HYPO 25X1 1.5 SAFETY (NEEDLE) ×2 IMPLANT
NS IRRIG 500ML POUR BTL (IV SOLUTION) ×2 IMPLANT
PACK EXTREMITY ARMC (MISCELLANEOUS) ×2 IMPLANT
PENCIL ELECTRO HAND CTR (MISCELLANEOUS) ×2 IMPLANT
SUT PROLENE 4 0 PS 2 18 (SUTURE) ×2 IMPLANT
SUT VIC AB 4-0 RB1 27 (SUTURE) ×2
SUT VIC AB 4-0 RB1 27X BRD (SUTURE) ×1 IMPLANT
SYR 10ML LL (SYRINGE) ×2 IMPLANT
WATER STERILE IRR 500ML POUR (IV SOLUTION) ×2 IMPLANT

## 2020-11-10 NOTE — Transfer of Care (Signed)
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 (Right: Hand)  Patient Location: PACU  Anesthesia Type:General  Level of Consciousness: awake  Airway & Oxygen Therapy: Patient connected to face mask oxygen  Post-op Assessment: Report given to RN  Post vital signs: stable  Last Vitals:  Vitals Value Taken Time  BP 136/97 11/10/20 0830  Temp 36.1 C 11/10/20 0830  Pulse 66 11/10/20 0831  Resp 16 11/10/20 0831  SpO2 100 % 11/10/20 0831  Vitals shown include unvalidated device data.  Last Pain:  Vitals:   11/10/20 0617  TempSrc: Temporal  PainSc: 0-No pain         Complications: No notable events documented.

## 2020-11-10 NOTE — Anesthesia Procedure Notes (Signed)
Procedure Name: LMA Insertion Date/Time: 11/10/2020 7:32 AM Performed by: Jonna Clark, CRNA Pre-anesthesia Checklist: Patient identified, Patient being monitored, Timeout performed, Emergency Drugs available and Suction available Patient Re-evaluated:Patient Re-evaluated prior to induction Oxygen Delivery Method: Circle system utilized Preoxygenation: Pre-oxygenation with 100% oxygen Induction Type: IV induction Ventilation: Mask ventilation without difficulty LMA: LMA inserted LMA Size: 4.0 Tube type: Oral Number of attempts: 1 Placement Confirmation: positive ETCO2 and breath sounds checked- equal and bilateral Tube secured with: Tape Dental Injury: Teeth and Oropharynx as per pre-operative assessment  Comments: Inserted by Theresa Mulligan SRNA

## 2020-11-10 NOTE — Anesthesia Postprocedure Evaluation (Addendum)
Anesthesia Post Note  Patient: Douglas Bird  Procedure(s) Performed: 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. (Right: Hand)  Patient location during evaluation: PACU Anesthesia Type: General Level of consciousness: awake and alert Pain management: pain level controlled Vital Signs Assessment: post-procedure vital signs reviewed and stable Respiratory status: spontaneous breathing, nonlabored ventilation, respiratory function stable and patient connected to nasal cannula oxygen Cardiovascular status: blood pressure returned to baseline and stable Postop Assessment: no apparent nausea or vomiting Anesthetic complications: no   No notable events documented.   Last Vitals:  Vitals:   11/10/20 0617 11/10/20 0830  BP: 124/86 (!) 136/97  Pulse: 61 69  Resp: 18 15  Temp: (!) 35.8 C (!) 36.1 C  SpO2: 97% 100%    Last Pain:  Vitals:   11/10/20 0617  TempSrc: Temporal  PainSc: 0-No pain                 Lanora Manis

## 2020-11-10 NOTE — H&P (Signed)
History of Present Illness:  Douglas Bird is a 55 y.o. male who presents for follow-up now nearly 6 months status post primary repairs of the right EIP and index EDC tendons with irrigation and debridement and primary repair of a right dorsal hand laceration. Overall, the patient notes little change in his symptoms as compared to his previous visit 6 weeks ago. He continues to note moderate-severe pain over the dorsal aspect of the hand in the area of the laceration, especially if he bumps it accidentally. At rest, he only notes mild discomfort at 2/10. He has been taking ibuprofen as necessary and applying ice or ice alternating with heat with limited benefit. He notes that the injection into this area at his last visit helped for several days. However, once it wore off, it was "back to the same". He had continue to attend occupational therapy until several weeks ago when the therapist said that she had done all she could for him. He continues to perform range of motion and strengthening exercises on his own at home. He has been trying to do as much as he can around the house but still has some limitations due to the discomfort on the dorsal aspect of his hand. He denies any reinjury to the hand, and denies any fevers or chills. He remains out of work at this time.  Current Outpatient Medications:  diclofenac (VOLTAREN) 1 % topical gel Apply 2 g topically continuously as needed   hydroCHLOROthiazide (HYDRODIURIL) 25 MG tablet Take 12.5 mg by mouth once daily   ibuprofen (MOTRIN) 800 MG tablet Take 1 tablet (800 mg total) by mouth 3 (three) times daily as needed 90 tablet 1   levothyroxine (SYNTHROID) 150 MCG tablet Take 1 tablet by mouth once daily   lisinopriL (ZESTRIL) 20 MG tablet Take 20 mg by mouth once daily   sildenafiL (VIAGRA) 100 MG tablet Take 1 tablet by mouth once daily as needed   simvastatin (ZOCOR) 20 MG tablet Take 1 tablet by mouth once daily   traMADoL (ULTRAM) 50 mg tablet Take 1  tablet (50 mg total) by mouth every 6 (six) hours as needed for Pain 30 tablet 0   Allergies: No Known Allergies  Past Medical History:   Diverticulitis 2011   GERD (gastroesophageal reflux disease)   Hyperlipemia   Hypertension   Hypothyroidism   Thyroid disease   Past Surgical History:   Primary repair of right EIP and index EDC tendons, Irrigation and debridement with primary repair of right dorsal hand laceration (approximately 10 cm in length). Right 04/28/2020 (Dr. Roland Rack)   CHOLECYSTECTOMY 08/2011   COLONOSCOPY 04/28/2019 (surgeon: Sherri Sear)   [place] HERNIA REPAIR [id number] (Surgeon: Fredirick Maudlin)   Family History:   Stroke Father   Gout Father   Aneurysm Father   Social History:   Socioeconomic History:   Marital status: Single  Tobacco Use   Smoking status: Current Every Day Smoker  Years: 15.00  Types: Cigarettes   Smokeless tobacco: Former Systems developer  Substance and Sexual Activity   Alcohol use: Yes  Alcohol/week: 12.0 standard drinks  Types: 12 Cans of beer per week   Drug use: Never   Sexual activity: Not Currently   Review of Systems:  A comprehensive 14 point ROS was performed, reviewed, and the pertinent orthopaedic findings are documented in the HPI.  Physical Exam: Vitals:  10/15/20 0931  BP: (!) 138/92  Weight: 100.2 kg (220 lb 12.8 oz)  Height: 172.7 cm ('5\' 8"'$ )  PainSc: 2  PainLoc: Hand   General/Constitutional: The patient appears to be well-nourished, well-developed, and in no acute distress. Neuro/Psych: Normal mood and affect, oriented to person, place and time.  Eyes: Non-icteric. Pupils are equal, round, and reactive to light, and exhibit synchronous movement. ENT: Unremarkable. Lymphatic: No palpable adenopathy. Respiratory: Lungs clear to auscultation, Normal chest excursion, No wheezes and Non-labored breathing Cardiovascular: Regular rate and rhythm. No murmurs. and No edema, swelling or tenderness,  except as noted in detailed exam. Integumentary: No impressive skin lesions present, except as noted in detailed exam. Musculoskeletal: Unremarkable, except as noted in detailed exam.  Right hand exam: On inspection, his dorsal hand laceration again is well-healed and without evidence for infection. No swelling, erythema, ecchymosis, abrasions, or other skin abnormalities are identified. There is persistent moderate-severe focal tenderness to palpation between the second and third metacarpals just lateral to the laceration, but no other areas of tenderness around the wrist or hand are noted. Actively, his index MCP joint can be extended to within 5 degrees of neutral extension, and can be flexed to 80 degrees.  He exhibits 4-4+/5 strength with resisted long finger extension.  The long finger MCP joint can be flexed from 0 to 80 degrees, while the PIP joint can be flexed from 0 to 95 degrees.  He is now able to almost make a full fist as the tip of his index finger lacks 8 mm from achieving the proximal palmar crease while the long fingertip lacks 5 mm from achieving the proximal palmar crease.  He is neurovascularly intact to all digits, other than the area of subjectively decreased sensation over the dorsum of his index and long fingers near the second webspace.  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  Plan: The treatment options were discussed with the patient. In addition, patient educational materials were provided regarding the diagnosis and treatment options. The patient is quite frustrated by his residual symptoms and functional limitations caused by the presumed traumatic neuroma on the dorsal aspect of his hand. Therefore, I have recommended a surgical procedure, specifically an excision of the traumatic neuroma with repositioning of the tip of the nerve into the adjacent interosseous muscle. The procedure was discussed with the patient, as were the  potential risks (including bleeding, infection, nerve and/or blood vessel injury, persistent or recurrent pain, residual stiffness/weakness, need for further surgery, blood clots, strokes, heart attacks and/or arhythmias, pneumonia, etc.) and benefits. The patient states his understanding and wishes to proceed. All of the patient's questions and concerns were answered. He can call any time with further concerns. He will follow up post-surgery, routine.   H&P reviewed and patient re-examined. No changes.

## 2020-11-10 NOTE — Anesthesia Preprocedure Evaluation (Signed)
Anesthesia Evaluation  Patient identified by MRN, date of birth, ID band Patient awake    Reviewed: Allergy & Precautions, NPO status , Patient's Chart, lab work & pertinent test results  History of Anesthesia Complications Negative for: history of anesthetic complications  Airway Mallampati: III  TM Distance: >3 FB Neck ROM: Full    Dental  (+) Teeth Intact, Poor Dentition, Dental Advidsory Given   Pulmonary neg shortness of breath, neg sleep apnea, neg COPD, neg recent URI, Current SmokerPatient did not abstain from smoking., former smoker,    Pulmonary exam normal breath sounds clear to auscultation       Cardiovascular Exercise Tolerance: Good METShypertension, (-) angina(-) CAD and (-) Past MI (-) dysrhythmias  Rhythm:Regular Rate:Normal - Systolic murmurs    Neuro/Psych  Headaches, negative psych ROS   GI/Hepatic GERD  Medicated,(+)     (-) substance abuse  ,   Endo/Other  neg diabetesHypothyroidism   Renal/GU negative Renal ROS     Musculoskeletal   Abdominal   Peds  Hematology   Anesthesia Other Findings Past Medical History: 2011: Diverticulitis No date: GERD (gastroesophageal reflux disease) No date: Hyperlipidemia No date: Hypertension     Comment:  controlled on meds No date: Hypothyroidism No date: Thyroid disease  Reproductive/Obstetrics                             Anesthesia Physical  Anesthesia Plan  ASA: 2  Anesthesia Plan: General   Post-op Pain Management:    Induction: Intravenous  PONV Risk Score and Plan: 2 and Ondansetron, Dexamethasone and Midazolam  Airway Management Planned: LMA  Additional Equipment: None  Intra-op Plan:   Post-operative Plan: Extubation in OR  Informed Consent: I have reviewed the patients History and Physical, chart, labs and discussed the procedure including the risks, benefits and alternatives for the proposed anesthesia  with the patient or authorized representative who has indicated his/her understanding and acceptance.     Dental advisory given  Plan Discussed with: CRNA and Surgeon  Anesthesia Plan Comments: (Discussed risks of anesthesia with patient, including PONV, sore throat, lip/dental damage. Rare risks discussed as well, such as cardiorespiratory and neurological sequelae. Patient understands. Patient counseled on benefits of smoking cessation, and increased perioperative risks associated with continued smoking. )        Anesthesia Quick Evaluation

## 2020-11-10 NOTE — Discharge Instructions (Addendum)
Orthopedic discharge instructions: Keep dressing dry and intact. Keep hand elevated above heart level. May shower after dressing removed on postop day 4 (Sunday). Cover sutures with Band-Aids after drying off. Apply ice to affected area frequently. Take ibuprofen 600-800 mg TID with meals for 7-10 days, then as necessary. Take ES Tylenol or pain medication as prescribed when needed.  Return for follow-up in 10-14 days or as scheduled.   AMBULATORY SURGERY  DISCHARGE INSTRUCTIONS   The drugs that you were given will stay in your system until tomorrow so for the next 24 hours you should not:  Drive an automobile Make any legal decisions Drink any alcoholic beverage   You may resume regular meals tomorrow.  Today it is better to start with liquids and gradually work up to solid foods.  You may eat anything you prefer, but it is better to start with liquids, then soup and crackers, and gradually work up to solid foods.   Please notify your doctor immediately if you have any unusual bleeding, trouble breathing, redness and pain at the surgery site, drainage, fever, or pain not relieved by medication.    Additional Instructions:        Please contact your physician with any problems or Same Day Surgery at [phone], Monday through Friday 6 am to 4 pm, or [place] at [facility] number at [phone].

## 2020-11-10 NOTE — Op Note (Signed)
11/10/2020  8:46 AM  Patient:   Douglas Bird  Pre-Op Diagnosis:   Painful traumatic neuroma status post dorsal right hand laceration.  Post-Op Diagnosis:   Same  Procedure:   Wound exploration with excision of traumatic neuroma, dorsum right hand.  Surgeon:   Pascal Lux, MD  Assistant:   None  Anesthesia:   General LMA  Findings:   As above.  Complications:   None  Fluids:   400 cc crystalloid  EBL:   0 cc  UOP:   None  TT:   31 minutes at 250 mmHg  Drains:   None  Closure:   4-0 Prolene interrupted sutures  Brief Clinical Note:   The patient is a 55 year old male who is now 6 months status post a wound exploration and repair of several extensor tendon lacerations following a traumatic laceration to the dorsal aspect of his right hand.  Despite extensive physical therapy, medications, activity modification, injections, etc., he continues to have severe focal pain to even light touch over the dorsal aspect of his hand.  His history and examination consistent with a traumatic neuroma.  He presents at this time for debridement of the traumatic neuroma.  Procedure:   The patient was brought into the operating room and lain in the supine position.  After adequate general laryngeal mask anesthesia was obtained, the patient's right hand and upper extremity were prepped with ChloraPrep solution before being draped sterilely.  Preoperative antibiotics were administered.  A timeout was performed to verify the appropriate surgical site before the limb was exsanguinated with an Esmarch and the tourniquet inflated to 250 mmHg.  At approximately 4 cm incision was made over the dorsal aspect of the hand centered over the area of maximal tenderness, utilizing the previous incision.  His incision was carried down through the dermis to expose the subcutaneous tissues.  Careful subcutaneous dissection was carried out to identify the dorsal neurovascular structures.  A branch of the  superficial radial nerve was identified that appeared to extend into the scar tissue.  This was released and dissected proximally, then sewn into the interosseous muscle between the index and long metacarpals utilizing a 5-0 Vicryl suture.  The wound was copiously irrigated with sterile saline solution before being closed using 4-0 Prolene interrupted sutures.  A total of 5 cc of 0.5% plain Sensorcaine was injected in and around the incision to help with postoperative analgesia before a sterile bulky dressing was applied to the wound.  The patient was then awakened, extubated, and returned to the recovery room in satisfactory condition after tolerating the procedure well.

## 2020-11-11 LAB — SURGICAL PATHOLOGY

## 2020-11-11 NOTE — Addendum Note (Signed)
Addendum  created 11/11/20 0743 by Martha Clan, MD   Clinical Note Signed

## 2020-11-17 ENCOUNTER — Other Ambulatory Visit: Payer: Self-pay

## 2020-12-01 ENCOUNTER — Other Ambulatory Visit: Payer: Self-pay

## 2020-12-21 ENCOUNTER — Other Ambulatory Visit: Payer: Self-pay

## 2020-12-21 MED FILL — Simvastatin Tab 20 MG: ORAL | 90 days supply | Qty: 90 | Fill #0 | Status: AC

## 2020-12-27 ENCOUNTER — Encounter: Payer: Self-pay | Admitting: General Surgery

## 2021-01-07 ENCOUNTER — Other Ambulatory Visit: Payer: Self-pay

## 2021-01-07 ENCOUNTER — Ambulatory Visit: Payer: PRIVATE HEALTH INSURANCE | Attending: Surgery | Admitting: Occupational Therapy

## 2021-01-07 ENCOUNTER — Encounter: Payer: Self-pay | Admitting: Occupational Therapy

## 2021-01-07 DIAGNOSIS — M25631 Stiffness of right wrist, not elsewhere classified: Secondary | ICD-10-CM | POA: Diagnosis present

## 2021-01-07 DIAGNOSIS — M79641 Pain in right hand: Secondary | ICD-10-CM | POA: Diagnosis not present

## 2021-01-07 DIAGNOSIS — M25641 Stiffness of right hand, not elsewhere classified: Secondary | ICD-10-CM | POA: Diagnosis present

## 2021-01-07 DIAGNOSIS — L905 Scar conditions and fibrosis of skin: Secondary | ICD-10-CM | POA: Insufficient documentation

## 2021-01-07 DIAGNOSIS — M6281 Muscle weakness (generalized): Secondary | ICD-10-CM | POA: Diagnosis present

## 2021-01-07 NOTE — Therapy (Signed)
[facility] [address], [place], [address] Phone: [phone]   Fax:  [phone]  Occupational Therapy Evaluation  Patient Details  Name: Douglas Bird MRN: [id number] Date of Birth: 05-01-1965 Referring Provider (OT): DR poggi   Encounter Date: 01/07/2021   OT End of Session - 01/07/21 0954     Visit Number 1    Number of Visits 10    Date for OT Re-Evaluation 03/18/21    OT Start Time 0958    OT Stop Time 1046    OT Time Calculation (min) 48 min    Activity Tolerance Patient tolerated treatment well    Behavior During Therapy [facility] for tasks assessed/performed             Past Medical History:  Diagnosis Date   Diverticulitis 2011   GERD (gastroesophageal reflux disease)    Hyperlipidemia    Hypertension    controlled on meds   Hypothyroidism    Thyroid disease     Past Surgical History:  Procedure Laterality Date   CHOLECYSTECTOMY  June 2013   COLONOSCOPY WITH PROPOFOL N/A 04/28/2019   Procedure: COLONOSCOPY WITH PROPOFOL;  Surgeon: Lin Landsman, MD;  Location: [facility];  Service: Endoscopy;  Laterality: N/A;  priority 4   HERNIA REPAIR Bilateral    inguinal   REPAIR EXTENSOR TENDON Right 04/28/2020   Procedure: EXPLORATION OF RIGHT DORSAL HAND LACERATION WITH REPAIR OF INDEX EXTENSOR TENDON LACERATION;  Surgeon: Corky Mull, MD;  Location: [facility];  Service: Orthopedics;  Laterality: Right;   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   Subjective Assessment - 01/07/21 0951      Subjective  I worked it since this nerve surgery and strength and motion good - but pain still same with using any tools, and vibration as well as picking up hail bales - could only do 8 the other days- And I have a lot more to do - sons had to help me    Pertinent History Pt had saw accident on 04/26/20 at [facility] and then Surgery by Dr Roland Rack on 04/28/20 - Primary repair of right EIP and index EDC tendons.2. Irrigation and debridement with primary repair of right dorsal hand laceration (approximately 10 cm in length). Pt was seen for OT and cont to had nerve pain -11/10/20 had traumatic Neuroma excision - and refer to OT now for work hardening - pt is 8 1/2 wks s/p    Patient Stated Goals Want to get the use of my R hand back to be able to work and take care of the animals and yard /garden at home    Currently in Pain? Yes    Pain Score 5    8 wiht touch and repetitive use or vibration   Pain Location Hand    Pain Orientation Posterior;Right    Pain Descriptors / Indicators Tender;Tightness;Burning    Pain Type Surgical pain;Neuropathic pain    Pain Onset More than a month ago    Pain Frequency Intermittent  [facility] OT Assessment - 01/07/21 0001       Assessment   Medical Diagnosis R  hand traumatic neuroma excision    Referring Provider (OT) DR poggi    Onset Date/Surgical Date 11/10/20    Hand Dominance Right    Prior Therapy earlier this year for Ext tendon repair of R hand 2nd and 3rd digits      Home  Environment   Lives With Significant other      Prior Function   Vocation Full time employment    Leisure Architect, farm -animals, yard , garden , fishing      AROM   Right Wrist Extension 50 Degrees    Right Wrist Flexion 90 Degrees      Strength   Right Hand Grip (lbs) 74    Right Hand Lateral Pinch 28 lbs    Right Hand 3 Point Pinch 18 lbs    Left Hand Grip (lbs) 85    Left Hand Lateral Pinch 29 lbs    Left Hand 3 Point Pinch 16 lbs      Right Hand  AROM   R Index  MCP 0-90 80 Degrees    R Index PIP 0-100 90 Degrees    R Long  MCP 0-90 70 Degrees   -10   R Long PIP 0-100 95 Degrees   -15   R Ring PIP 0-100 80 Degrees    R Ring DIP 0-70 100 Degrees    R Little PIP 0-100 90 Degrees    R Little DIP 0-70 90 Degrees              1-2 x day  Heat Volar wrist and forearm massage CT and MC spreads Rolling over roller volar hand and forearm Done taping 2 star over dorsal scar - kinesiotaping - 100 % - not able to tolerate 3  Replace it during day  AROM only for tendon glides Prayer stretch after massage for composite extention  Radial N glide - but stop when feeling pull at last step - 5  reps only on this on                 OT Education - 01/07/21 1258     Education Details findings of eval and HEP    Person(s) Educated Patient    Methods Explanation;Demonstration;Tactile cues;Verbal cues;Handout    Comprehension Verbal cues required;Returned demonstration;Verbalized understanding              OT Short Term Goals - 01/07/21 1305       OT SHORT TERM GOAL #1   Title Pt to be independent in HEP and desentitization to tolerate scar massage and different textures    Baseline pain and tenderness to scar cont to be 5-8/10 - scar massage and textures- vibration tools or repetitive gripping and release    Time 6    Period Weeks    Status New    Target Date 02/18/21               OT Long Term Goals - 01/07/21 1306       OT LONG TERM GOAL #1   Title Pt R hand digits flexion increase for pt to touch palm to grip and release IADLs objects with pain less than 2-3/10    Baseline composite flexion and extention decrease compare to few months ago prior to last surgery - pain 5-8/10 with  gripping tools/ release when using force , hammering and vibration  Time 10    Period Weeks    Status New    Target Date 03/18/21                   Plan - 01/07/21 0955     Clinical Impression Statement Pt  is about 8 months  s/p right repair of 2nd digit EIP and EDC with irrigation and debridement.  Pt was seen earlier this year but had increase pain from traumatic neuroma of digital Radial N - that he had excition done on 11/10/20 - pt this date refer back to OT for rehab - did not return back to work yet. Pt this date at eval 8 1/2 wks s/p - show AROM in wrist and digits WFL -but composite flexion and exention are decrease compare to last session prior to surgery - some scar adhesion at end range - and pt stil very tender over incision and nerve for touch, textures and more pain/discomfort with release of fist or pinch than flexion. Pt report pain still 5-8/10 with any vibration or repetitive gripping of R hand in activiites - pt grip and prehension strength increase compare to prior to last surgery- pt cont to be limited by pain in IADL's and he needs to be able to do full duty going back to work .    OT Occupational Profile and History Problem Focused Assessment - Including review of records relating to presenting problem    Occupational performance deficits (Please refer to evaluation for details): ADL's;IADL's;Rest and Sleep;Work;Play;Leisure;Social Participation    Body Structure / Function / Physical Skills ADL;Edema;Dexterity;Decreased knowledge of use of DME;Decreased knowledge of precautions;Flexibility;ROM;UE functional use;Scar mobility;Pain;Strength;IADL;Coordination    Rehab Potential Good    Clinical Decision Making Limited treatment options, no task modification necessary    Comorbidities Affecting Occupational Performance: None    Modification or Assistance to Complete Evaluation  No modification of tasks or assist necessary to complete eval    OT Frequency 1x / week    OT Duration --   10 wks   OT Treatment/Interventions Self-care/ADL training;Contrast Bath;Fluidtherapy;Moist Heat;Paraffin;Therapeutic exercise;Ultrasound;DME and/or AE instruction;Manual Therapy;Passive range of motion;Scar  mobilization;Splinting;Patient/family education    Consulted and Agree with Plan of Care Patient             Patient will benefit from skilled therapeutic intervention in order to improve the following deficits and impairments:   Body Structure / Function / Physical Skills: ADL, Edema, Dexterity, Decreased knowledge of use of DME, Decreased knowledge of precautions, Flexibility, ROM, UE functional use, Scar mobility, Pain, Strength, IADL, Coordination       Visit Diagnosis: Pain in right hand - Plan: Ot plan of care cert/re-cert  Scar condition and fibrosis of skin - Plan: Ot plan of care cert/re-cert  Stiffness of right wrist, not elsewhere classified - Plan: Ot plan of care cert/re-cert  Stiffness of right hand, not elsewhere classified - Plan: Ot plan of care cert/re-cert  Muscle weakness (generalized) - Plan: Ot plan of care cert/re-cert    Problem List Patient Active Problem List   Diagnosis Date Noted   Toothache 02/20/2020   Headache 02/20/2020   Smoking 09/62/8366   Umbilical hernia, incarcerated    Back pain 03/27/2019   Benign hypertension 05/02/2018   Obesity (BMI 30.0-34.9) 04/30/2018   Healthcare maintenance 10/19/2016   Flu vaccine need 04/20/2016   GERD (gastroesophageal reflux disease) 04/01/2015   Gout 04/01/2015   Mixed hyperlipidemia 10/01/2014   Hypothyroidism 05/21/2014    Rosalyn Gess, OTR/L,CLT 01/07/2021,  1:10 PM  [facility] [address], [place], [address] Phone: [phone]   Fax:  [phone]  Name: ARTIST BLOOM MRN: [id number] Date of Birth: [birth year]

## 2021-01-13 ENCOUNTER — Other Ambulatory Visit: Payer: Self-pay

## 2021-01-13 ENCOUNTER — Ambulatory Visit: Payer: PRIVATE HEALTH INSURANCE | Admitting: Occupational Therapy

## 2021-01-13 DIAGNOSIS — M79641 Pain in right hand: Secondary | ICD-10-CM

## 2021-01-13 DIAGNOSIS — Z0000 Encounter for general adult medical examination without abnormal findings: Secondary | ICD-10-CM

## 2021-01-13 DIAGNOSIS — M6281 Muscle weakness (generalized): Secondary | ICD-10-CM

## 2021-01-13 DIAGNOSIS — E782 Mixed hyperlipidemia: Secondary | ICD-10-CM

## 2021-01-13 DIAGNOSIS — L905 Scar conditions and fibrosis of skin: Secondary | ICD-10-CM

## 2021-01-13 DIAGNOSIS — M25641 Stiffness of right hand, not elsewhere classified: Secondary | ICD-10-CM

## 2021-01-13 DIAGNOSIS — M25631 Stiffness of right wrist, not elsewhere classified: Secondary | ICD-10-CM

## 2021-01-13 NOTE — Therapy (Signed)
[facility] [address] [place], [address] Phone: [phone]   Fax:  [phone]  Occupational Therapy Treatment  Patient Details  Name: Douglas Bird MRN: [id number] Date of Birth: [birth year] Referring Provider (OT): DR poggi   Encounter Date: 01/13/2021   OT End of Session - 01/13/21 1932     Visit Number 2    Number of Visits 10    Date for OT Re-Evaluation 03/18/21    OT Start Time 3976    OT Stop Time 1402    OT Time Calculation (min) 47 min    Activity Tolerance Patient tolerated treatment well    Behavior During Therapy [facility] for tasks assessed/performed             Past Medical History:  Diagnosis Date   Diverticulitis 2011   GERD (gastroesophageal reflux disease)    Hyperlipidemia    Hypertension    controlled on meds   Hypothyroidism    Thyroid disease     Past Surgical History:  Procedure Laterality Date   CHOLECYSTECTOMY  June 2013   COLONOSCOPY WITH PROPOFOL N/A 04/28/2019   Procedure: COLONOSCOPY WITH PROPOFOL;  Surgeon: Lin Landsman, MD;  Location: [place];  Service: Endoscopy;  Laterality: N/A;  priority 4   HERNIA REPAIR Bilateral    inguinal   REPAIR EXTENSOR TENDON Right 04/28/2020   Procedure: EXPLORATION OF RIGHT DORSAL HAND LACERATION WITH REPAIR OF INDEX EXTENSOR TENDON LACERATION;  Surgeon: Corky Mull, MD;  Location: [facility];  Service: Orthopedics;  Laterality: Right;   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   Subjective Assessment - 01/13/21 1929      Subjective  I done everything you told me- yesterday I tried to use the big chain saw and tried to start it and it hurt that pull and jerky pull - 8/10 - but I could do the little one for while with no increase pain - also done some light hammering the other day on pole - for about 30 min - but other wise could not hold my goat, pick up hale bails    Pertinent History Pt had saw accident on 04/26/20 at [facility] and then Surgery by Dr Roland Rack on 04/28/20 - Primary repair of right EIP and index EDC tendons.2. Irrigation and debridement with primary repair of right dorsal hand laceration (approximately 10 cm in length). Pt was seen for OT and cont to had nerve pain -11/10/20 had traumatic Neuroma excision - and refer to OT now for work hardening - pt is 8 1/2 wks s/p    Patient Stated Goals Want to get the use of my R hand back to be able to work and take care of the animals and yard /garden at home    Currently in Pain? Yes    Pain Score 2     Pain Location Hand    Pain Orientation Right    Pain Descriptors / Indicators Tightness;Aching    Pain Type Surgical pain    Pain Onset More than a  month ago    Pain Frequency Intermittent                [facility] OT Assessment - 01/13/21 0001       AROM   Right Wrist Extension 60 Degrees    Right Wrist Flexion 90 Degrees      Strength   Right Hand Grip (lbs) 76    Right Hand Lateral Pinch 29 lbs    Right Hand 3 Point Pinch 22 lbs    Left Hand Grip (lbs) 92    Left Hand Lateral Pinch 28 lbs    Left Hand 3 Point Pinch 20 lbs      Right Hand AROM   R Index  MCP 0-90 80 Degrees    R Index PIP 0-100 95 Degrees    R Long  MCP 0-90 80 Degrees   -10   R Long PIP 0-100 100 Degrees   -10   R Ring PIP 0-100 80 Degrees    R Ring DIP 0-70 100 Degrees    R Little PIP 0-100 90 Degrees    R Little DIP 0-70 95 Degrees              Wrist ext , digits extention improved  Flexion of digits increase And grip and prehension  Area over nerve and incision  smaller now that is tender         OT Treatments/Exercises (OP) - 01/13/21 0001       Moist Heat Therapy   Number Minutes Moist Heat 6 Minutes    Moist Heat Location Hand   open hand, 2 min , hand infist 2 min alternate prior to stretches               1-2 x day  Heat - hand open and in fist  Prior to flexion of digits - tendon glides -and wrist flexion  Then combine the 2 but stop when feeling pull of 1/10  Followed by radial N glide- 5 reps - 3 x day - stop at 1/10 - open hand and close fist   Volar wrist and forearm massage CT and MC spreads Rolling over roller volar hand and forearm Done taping 2 star over dorsal scar - kinesiotaping - 100 %  again this date and can do daily at home   Prayer stretch after massage for composite extention  And then table slides - 12 reps - pain free             OT Education - 01/13/21 1932     Education Details progress and changes to HEP    Person(s) Educated Patient    Methods Explanation;Demonstration;Tactile cues;Verbal cues;Handout    Comprehension Verbal cues required;Returned demonstration;Verbalized understanding              OT Short Term Goals - 01/07/21 1305       OT SHORT TERM GOAL #1   Title Pt to be independent in HEP and desentitization to tolerate scar massage and different textures    Baseline pain and tenderness to scar cont to be 5-8/10 - scar massage and textures- vibration tools or repetitive gripping and release    Time 6    Period Weeks    Status New    Target Date 02/18/21               OT Long Term Goals - 01/07/21 1306       OT LONG TERM GOAL #1   Title Pt  R hand digits flexion increase for pt to touch palm to grip and release IADLs objects with pain less than 2-3/10    Baseline composite flexion and extention decrease compare to few months ago prior to last surgery - pain 5-8/10 with  gripping tools/ release when using force , hammering and vibration    Time 10    Period  Weeks    Status New    Target Date 03/18/21                   Plan - 01/13/21 1932     Clinical Impression Statement Pt is about 8 months  s/p right repair of 2nd digit EIP and EDC with irrigation and debridement.  Pt was seen earlier this year but had increase pain from traumatic neuroma of digital Radial N - that he had excition done on 11/10/20 - at eval pt 8 1/2 wks s/p  showed AROM in wrist and digits WFL -but composite flexion and exention are decrease compare to last session prior to surgery - some scar adhesion at end range - and pt stil very tender over incision and nerve for touch, textures and more pain/discomfort with release of fist or pinch than flexion. Pt report pain still 5-8/10 with any vibration or repetitive gripping of R hand in activiites - pt grip and prehension strength increase compare to prior to last surgery-  AND NOW pt return 9 1/2 wks s/p showed progress in tenderenss over scar smaller , composite flexion of digits , and wrist /digits extention improve- grip and prehension increase - Radial N glide pt to cont same but this date pt to focus on heat , composite fist , then wrist flexion -and then composite - add to radial N glide - 3 x day only 5 reps -and stop at 1/10 pull - pt do appear to be able  to do lighter but longer ability to do light chain saw and hammering - compare to prior to 2nd surgery- cont to do taping instead of scar massage - pt cont to be limited by pain in IADL's and he needs to be able to do full duty going back to work .    OT Occupational Profile and History Problem Focused Assessment - Including review of records relating to presenting problem    Occupational performance deficits (Please refer to evaluation for details): ADL's;IADL's;Rest and Sleep;Work;Play;Leisure;Social Participation    Body Structure / Function / Physical Skills ADL;Edema;Dexterity;Decreased knowledge of use of DME;Decreased knowledge of precautions;Flexibility;ROM;UE  functional use;Scar mobility;Pain;Strength;IADL;Coordination    Rehab Potential Good    Clinical Decision Making Limited treatment options, no task modification necessary    Comorbidities Affecting Occupational Performance: None    Modification or Assistance to Complete Evaluation  No modification of tasks or assist necessary to complete eval    OT Frequency 1x / week    OT Duration --   9 wks   OT Treatment/Interventions Self-care/ADL training;Contrast Bath;Fluidtherapy;Moist Heat;Paraffin;Therapeutic exercise;Ultrasound;DME and/or AE instruction;Manual Therapy;Passive range of motion;Scar mobilization;Splinting;Patient/family education    Consulted and Agree with Plan of Care Patient             Patient will benefit from skilled therapeutic intervention in order to improve the following deficits and impairments:   Body Structure / Function / Physical Skills: ADL, Edema, Dexterity, Decreased knowledge of use of DME, Decreased knowledge of precautions, Flexibility, ROM, UE functional use, Scar mobility, Pain, Strength, IADL, Coordination       Visit Diagnosis: Pain in  right hand  Scar condition and fibrosis of skin  Stiffness of right wrist, not elsewhere classified  Stiffness of right hand, not elsewhere classified  Muscle weakness (generalized)    Problem List Patient Active Problem List   Diagnosis Date Noted   Toothache 02/20/2020   Headache 02/20/2020   Smoking 33/17/4099   Umbilical hernia, incarcerated    Back pain 03/27/2019   Benign hypertension 05/02/2018   Obesity (BMI 30.0-34.9) 04/30/2018   Healthcare maintenance 10/19/2016   Flu vaccine need 04/20/2016   GERD (gastroesophageal reflux disease) 04/01/2015   Gout 04/01/2015   Mixed hyperlipidemia 10/01/2014   Hypothyroidism 05/21/2014    Rosalyn Gess, OTR/L,CLT 01/13/2021, 7:39 PM  [facility] [address], [place],  [address] Phone: [phone]   Fax:  [phone]  Name: Douglas Bird MRN: [id number] Date of Birth: [birth year]

## 2021-01-14 ENCOUNTER — Other Ambulatory Visit: Payer: Self-pay | Admitting: Gerontology

## 2021-01-14 ENCOUNTER — Other Ambulatory Visit: Payer: Self-pay

## 2021-01-14 DIAGNOSIS — I10 Essential (primary) hypertension: Secondary | ICD-10-CM

## 2021-01-14 LAB — HEMOGLOBIN A1C
Est. average glucose Bld gHb Est-mCnc: 105 mg/dL
Hgb A1c MFr Bld: 5.3 % (ref 4.8–5.6)

## 2021-01-14 LAB — CBC WITH DIFFERENTIAL/PLATELET
Basophils Absolute: 0.1 10*3/uL (ref 0.0–0.2)
Basos: 1 %
EOS (ABSOLUTE): 0.2 10*3/uL (ref 0.0–0.4)
Eos: 3 %
Hematocrit: 44.7 % (ref 37.5–51.0)
Hemoglobin: 15.1 g/dL (ref 13.0–17.7)
Immature Grans (Abs): 0 10*3/uL (ref 0.0–0.1)
Immature Granulocytes: 0 %
Lymphocytes Absolute: 1.9 10*3/uL (ref 0.7–3.1)
Lymphs: 29 %
MCH: 31 pg (ref 26.6–33.0)
MCHC: 33.8 g/dL (ref 31.5–35.7)
MCV: 92 fL (ref 79–97)
Monocytes Absolute: 0.7 10*3/uL (ref 0.1–0.9)
Monocytes: 11 %
Neutrophils Absolute: 3.6 10*3/uL (ref 1.4–7.0)
Neutrophils: 56 %
Platelets: 213 10*3/uL (ref 150–450)
RBC: 4.87 x10E6/uL (ref 4.14–5.80)
RDW: 12.7 % (ref 11.6–15.4)
WBC: 6.4 10*3/uL (ref 3.4–10.8)

## 2021-01-14 LAB — LIPID PANEL
Chol/HDL Ratio: 5.6 ratio — ABNORMAL HIGH (ref 0.0–5.0)
Cholesterol, Total: 207 mg/dL — ABNORMAL HIGH (ref 100–199)
HDL: 37 mg/dL — ABNORMAL LOW (ref >39)
LDL Chol Calc (NIH): 131 mg/dL — ABNORMAL HIGH (ref 0–99)
Triglycerides: 219 mg/dL — ABNORMAL HIGH (ref 0–149)
VLDL Cholesterol Cal: 39 mg/dL (ref 5–40)

## 2021-01-18 ENCOUNTER — Other Ambulatory Visit: Payer: Self-pay

## 2021-01-18 MED FILL — Hydrochlorothiazide Cap 12.5 MG: ORAL | 90 days supply | Qty: 90 | Fill #0 | Status: AC

## 2021-01-20 ENCOUNTER — Ambulatory Visit: Payer: PRIVATE HEALTH INSURANCE | Attending: Surgery | Admitting: Occupational Therapy

## 2021-01-20 ENCOUNTER — Ambulatory Visit: Payer: Self-pay

## 2021-01-20 DIAGNOSIS — M6281 Muscle weakness (generalized): Secondary | ICD-10-CM | POA: Insufficient documentation

## 2021-01-20 DIAGNOSIS — M79641 Pain in right hand: Secondary | ICD-10-CM | POA: Insufficient documentation

## 2021-01-20 DIAGNOSIS — L905 Scar conditions and fibrosis of skin: Secondary | ICD-10-CM | POA: Diagnosis present

## 2021-01-20 DIAGNOSIS — M25631 Stiffness of right wrist, not elsewhere classified: Secondary | ICD-10-CM | POA: Insufficient documentation

## 2021-01-20 DIAGNOSIS — M25641 Stiffness of right hand, not elsewhere classified: Secondary | ICD-10-CM | POA: Diagnosis present

## 2021-01-20 NOTE — Therapy (Signed)
[facility] [address] [place], [address] Phone: [phone]   Fax:  [phone]  Occupational Therapy Treatment  Patient Details  Name: Douglas Bird MRN: [id number] Date of Birth: 1965-06-10 Referring Provider (OT): DR poggi   Encounter Date: 01/20/2021   OT End of Session - 01/20/21 1512     Visit Number 3    Number of Visits 10    Date for OT Re-Evaluation 03/18/21    OT Start Time 4709    OT Stop Time 1437    OT Time Calculation (min) 34 min    Activity Tolerance Patient tolerated treatment well    Behavior During Therapy [facility] for tasks assessed/performed             Past Medical History:  Diagnosis Date   Diverticulitis 2011   GERD (gastroesophageal reflux disease)    Hyperlipidemia    Hypertension    controlled on meds   Hypothyroidism    Thyroid disease     Past Surgical History:  Procedure Laterality Date   CHOLECYSTECTOMY  June 2013   COLONOSCOPY WITH PROPOFOL N/A 04/28/2019   Procedure: COLONOSCOPY WITH PROPOFOL;  Surgeon: Lin Landsman, MD;  Location: [place] [facility];  Service: Endoscopy;  Laterality: N/A;  priority 4   HERNIA REPAIR Bilateral    inguinal   REPAIR EXTENSOR TENDON Right 04/28/2020   Procedure: EXPLORATION OF RIGHT DORSAL HAND LACERATION WITH REPAIR OF INDEX EXTENSOR TENDON LACERATION;  Surgeon: Corky Mull, MD;  Location: [facility];  Service: Orthopedics;  Laterality: Right;   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   Subjective Assessment - 01/20/21 1458      Subjective  I helped put up fences yesterday -and I try to hit pole with tbar in -and with the 2nd or 3rd it hurt sooo bad - I heard a pop , and immediate pain, swelling and change in color in my top of hand and finger - iced/ heat it last night , and pain pills I too    Pertinent History Pt had saw accident on 04/26/20 at [facility] and then Surgery by Dr Roland Rack on 04/28/20 - Primary repair of right EIP and index EDC tendons.2. Irrigation and debridement with primary repair of right dorsal hand laceration (approximately 10 cm in length). Pt was seen for OT and cont to had nerve pain -11/10/20 had traumatic Neuroma excision - and refer to OT now for work hardening - pt is 8 1/2 wks s/p    Patient Stated Goals Want to get the use of my R hand back to be able to work and take care of the animals and yard /garden at home    Currently in Pain? Yes    Pain Score 2     Pain Location Hand    Pain Orientation Right    Pain Descriptors / Indicators Tender;Aching;Tightness    Pain Type Surgical pain;Neuropathic pain                   Pt arrive with pain  over dorsal hand at 2nd and 3rd MC's- 2/10  Decrease to 1/10 after contrast and show increase composite flexion at wrist and digits -but decrease flexion at 3rd PIP and MC this date coming in and increase edema after reports of injury to it yesterday hitting post or pole in ground with tbar Tender area andTinel over dorsal 2nd and 3rd MC  increase again and area - as well as pain with Radial N glide increase this date - to more than 3-4/10  Compare to last week - worse AND new onset since yesterday of Numbness in webspace  of 2nd and 3rd         OT Treatments/Exercises (OP) - 01/20/21 0001       RUE Contrast Bath   Time 8 minutes    Comments decrease pain             Pt to hold off and do modalities to decrease pain and only AROM for fisting and prayer stretch /table slides for composite extention         OT Education - 01/20/21  1511     Education Details increase symptoms and changes    Person(s) Educated Patient    Methods Explanation;Demonstration;Tactile cues;Verbal cues;Handout    Comprehension Verbal cues required;Returned demonstration;Verbalized understanding              OT Short Term Goals - 01/07/21 1305       OT SHORT TERM GOAL #1   Title Pt to be independent in HEP and desentitization to tolerate scar massage and different textures    Baseline pain and tenderness to scar cont to be 5-8/10 - scar massage and textures- vibration tools or repetitive gripping and release    Time 6    Period Weeks    Status New    Target Date 02/18/21               OT Long Term Goals - 01/07/21 1306       OT LONG TERM GOAL #1   Title Pt R hand digits flexion increase for pt to touch palm to grip and release IADLs objects with pain less than 2-3/10    Baseline composite flexion and extention decrease compare to few months ago prior to last surgery - pain 5-8/10 with  gripping tools/ release when using force , hammering and vibration    Time 10    Period Weeks    Status New    Target Date 03/18/21                   Plan - 01/20/21 1512     Clinical Impression Statement Pt is about 8 months  s/p right repair of 2nd digit EIP and EDC with irrigation and debridement.  Pt was seen earlier this year but had increase pain from traumatic neuroma of digital Radial N - that he had excition done on 11/10/20 - at eval pt 8 1/2 wks s/p  showed AROM in wrist and digits WFL -but composite flexion and exention are decrease compare to last session prior to surgery - some scar adhesion at end range - and pt  was stil very tender over incision and nerve for touch, textures and more pain/discomfort with release of fist or pinch than flexion. Pt report pain  was l 5-8/10 with any vibration or repetitive gripping of R hand in activiites - pt grip and prehension strength increase compare to prior to last surgery-   THEN  LAST  WEEK he showed progress in tenderenss over scar,  smaller area , composite flexion of digits , and wrist /digits extention improved- grip and prehension increased - and able to do light hammering and small chain saw some- Had pt work on light composite Radial N glide- with great success - done some taping over scar for scar mobs the last 2 wks  - BUT THIS DATE pt arrive with increase pain , swelling and tendernes/Tinel over dorsal 2nd MC and hand- pt report he hit pole in the ground with tbar yesterday -and after 2nd or 3rd had increase pain , swelling  -heard pop- and had last night deep pain - done pain medications- this date increase tenderness, pain , increase pain with Radial N glide - and swelling - pt also wiht some numbness repors in webspace of 2nd and 3rd- will reach out to DR Poggi - ? nerve rupture or injury -pt to do modalities to decrease pain , do AROM only - and nothing forcefull - pt cont to be limited by pain in IADL's and he needs to be able to do full duty going back to work .    OT Occupational Profile and History Problem Focused Assessment - Including review of records relating to presenting problem    Occupational performance deficits (Please refer to evaluation for details): ADL's;IADL's;Rest and Sleep;Work;Play;Leisure;Social Participation    Body Structure / Function / Physical Skills ADL;Edema;Dexterity;Decreased knowledge of use of DME;Decreased knowledge of precautions;Flexibility;ROM;UE functional use;Scar mobility;Pain;Strength;IADL;Coordination    Rehab Potential Good    Clinical Decision Making Limited treatment options, no task modification necessary    Comorbidities Affecting Occupational Performance: None    Modification or Assistance to Complete Evaluation  No modification of tasks or assist necessary to complete eval    OT Frequency 1x / week    OT Duration 8 weeks    OT Treatment/Interventions Self-care/ADL training;Contrast Bath;Fluidtherapy;Moist  Heat;Paraffin;Therapeutic exercise;Ultrasound;DME and/or AE instruction;Manual Therapy;Passive range of motion;Scar mobilization;Splinting;Patient/family education    Consulted and Agree with Plan of Care Patient             Patient will benefit from skilled therapeutic intervention in order to improve the following deficits and impairments:   Body Structure / Function / Physical Skills: ADL, Edema, Dexterity, Decreased knowledge of use of DME, Decreased knowledge of precautions, Flexibility, ROM, UE functional use, Scar mobility, Pain, Strength, IADL, Coordination       Visit Diagnosis: Pain in right hand  Scar condition and fibrosis of skin  Stiffness of right wrist, not elsewhere classified  Stiffness of right hand, not elsewhere classified  Muscle weakness (generalized)    Problem List Patient Active Problem List   Diagnosis Date Noted   Toothache 02/20/2020   Headache 02/20/2020   Smoking 44/03/270   Umbilical hernia, incarcerated    Back pain 03/27/2019   Benign hypertension 05/02/2018   Obesity (BMI 30.0-34.9) 04/30/2018   Healthcare maintenance 10/19/2016   Flu vaccine need 04/20/2016   GERD (gastroesophageal reflux disease) 04/01/2015   Gout 04/01/2015   Mixed hyperlipidemia 10/01/2014   Hypothyroidism 05/21/2014    Rosalyn Gess, OTR/L,CLT 01/20/2021, 3:23 PM  [facility] [address], [place], [address] Phone: [phone]   Fax:  [phone]  Name: Douglas Bird MRN: [id number] Date of Birth: 1965/04/03

## 2021-01-27 ENCOUNTER — Encounter: Payer: Self-pay | Admitting: Obstetrics and Gynecology

## 2021-01-27 ENCOUNTER — Ambulatory Visit: Payer: PRIVATE HEALTH INSURANCE | Attending: Surgery | Admitting: Occupational Therapy

## 2021-01-27 ENCOUNTER — Ambulatory Visit: Payer: Self-pay | Admitting: Obstetrics and Gynecology

## 2021-01-27 ENCOUNTER — Other Ambulatory Visit: Payer: Self-pay

## 2021-01-27 DIAGNOSIS — M25641 Stiffness of right hand, not elsewhere classified: Secondary | ICD-10-CM | POA: Diagnosis present

## 2021-01-27 DIAGNOSIS — M25631 Stiffness of right wrist, not elsewhere classified: Secondary | ICD-10-CM | POA: Insufficient documentation

## 2021-01-27 DIAGNOSIS — L905 Scar conditions and fibrosis of skin: Secondary | ICD-10-CM | POA: Insufficient documentation

## 2021-01-27 DIAGNOSIS — M6281 Muscle weakness (generalized): Secondary | ICD-10-CM | POA: Diagnosis present

## 2021-01-27 DIAGNOSIS — M79641 Pain in right hand: Secondary | ICD-10-CM | POA: Insufficient documentation

## 2021-01-27 DIAGNOSIS — K219 Gastro-esophageal reflux disease without esophagitis: Secondary | ICD-10-CM

## 2021-01-27 DIAGNOSIS — E039 Hypothyroidism, unspecified: Secondary | ICD-10-CM

## 2021-01-27 DIAGNOSIS — E782 Mixed hyperlipidemia: Secondary | ICD-10-CM

## 2021-01-27 DIAGNOSIS — I10 Essential (primary) hypertension: Secondary | ICD-10-CM

## 2021-01-27 MED ORDER — LEVOTHYROXINE SODIUM 150 MCG PO TABS
ORAL_TABLET | ORAL | 0 refills | Status: DC
  Filled 2021-01-27: qty 90, 90d supply, fill #0

## 2021-01-27 MED ORDER — LISINOPRIL 20 MG PO TABS
ORAL_TABLET | Freq: Every day | ORAL | 0 refills | Status: DC
  Filled 2021-01-27: qty 30, fill #0
  Filled ????-??-??: fill #0

## 2021-01-27 MED ORDER — SIMVASTATIN 20 MG PO TABS
ORAL_TABLET | Freq: Every day | ORAL | 1 refills | Status: DC
  Filled 2021-01-27: qty 90, fill #0

## 2021-01-27 MED ORDER — OMEPRAZOLE 20 MG PO CPDR
DELAYED_RELEASE_CAPSULE | Freq: Every day | ORAL | 1 refills | Status: DC
  Filled 2021-01-27: qty 90, fill #0
  Filled 2021-02-17: qty 90, 90d supply, fill #0

## 2021-01-27 NOTE — Progress Notes (Signed)
[place]   Established Patient Office Visit  Subjective:  Patient ID: Douglas Bird, male    DOB: August 27, 1965  Age: 55 y.o. MRN: [id number]  CC:  Chief Complaint  Patient presents with   Follow-up    Review labs from  2 weeks ago.   Medication Refill    Simvastatin,Omeprazole, Lisinopril, HCTZ, Levothyroxine    HPI Douglas Bird is a 55 year old male who has history of diverticulitis, GERD, hyperlipidemia, hypertension, hypothyroidism, presents for routine follow-up visit , lab review and medication refill. He is no longer checking his blood pressure at home but feels it's a little higher tonight due to white coat HTN. He desnies chest pain, palpitation, dizziness, vision changes. He quit smoking ~1 yr ago. He adheres to DASH diet and exercises daily as tolerated. He states that he is compliant with his medications, denies side effects, and continues to make healthy lifestyle changes.  Overall he states that he is doing well, and offers no further complaints. Recent labs WNL except lipids which are improved. No recent TSH/free T4.   Past Medical History:  Diagnosis Date   Diverticulitis 2011   GERD (gastroesophageal reflux disease)    Hyperlipidemia    Hypertension    controlled on meds   Hypothyroidism    Thyroid disease     Past Surgical History:  Procedure Laterality Date   CHOLECYSTECTOMY  June 2013   COLONOSCOPY WITH PROPOFOL N/A 04/28/2019   Procedure: COLONOSCOPY WITH PROPOFOL;  Surgeon: Lin Landsman, MD;  Location: [facility];  Service: Endoscopy;  Laterality: N/A;  priority 4   HERNIA REPAIR Bilateral    inguinal   REPAIR EXTENSOR TENDON Right 04/28/2020   Procedure: EXPLORATION OF RIGHT DORSAL HAND LACERATION WITH REPAIR OF INDEX EXTENSOR TENDON LACERATION;  Surgeon: Corky Mull, MD;  Location: [facility];  Service: Orthopedics;  Laterality: Right;   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   Procedure: EXCISION OF A  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 HAND.;  Surgeon: Corky Mull, MD;  Location: [facility];  Service: Orthopedics;  Laterality: Right;   UMBILICAL HERNIA REPAIR N/A 08/11/2019   Procedure: HERNIA REPAIR UMBILICAL ADULT;  Surgeon: Fredirick Maudlin, MD;  Location: [facility];  Service: General;  Laterality: N/A;  RNFA    Family History  Problem Relation Age of Onset   Stroke Father    Gout Father    Aneurysm Father        2006   Thyroid disease Father    Epilepsy Father    Thyroid disease Sister    Diabetes Paternal Grandfather    Cancer Mother     Social History   Socioeconomic History   Marital status: Significant Other    Spouse name: Not on file   Number of children: Not on file   Years of education: Not on file   Highest education level: Not on file  Occupational History   Not on file  Tobacco Use   Smoking status: Former    Packs/day: 0.25    Years: 15.00    Pack years: 3.75    Types: Cigarettes    Quit date: 02/17/2020    Years since quitting: 0.9   Smokeless tobacco: Former   Tobacco comments:    quit 8 weeks ago  Vaping Use   Vaping Use: Never used  Substance and Sexual Activity   Alcohol use: Yes    Alcohol/week: 12.0 standard drinks  Types: 12 Cans of beer per week    Comment: 12 a week "if that"   Drug use: No   Sexual activity: Not Currently  Other Topics Concern   Not on file  Social History Narrative   [place] )Girlfriend   Social Determinants of Health   Financial Resource Strain: Not on file  Food Insecurity: Not on file  Transportation Needs: Not on file  Physical Activity: Unknown   Days of Exercise per Week: 7 days   Minutes of Exercise per Session: Not on file  Stress: Not on file  Social Connections: Not on file  Intimate Partner Violence: Not on file    Outpatient Medications Prior to Visit  Medication Sig Dispense Refill   hydrochlorothiazide (MICROZIDE) 12.5 MG capsule TAKE ONE CAPSULE (12.5 MG) BY MOUTH  EVERY DAY 90 capsule 1   HYDROcodone-acetaminophen (NORCO) 5-325 MG tablet Take 1-2 tablets by mouth every 6 (six) hours as needed for moderate pain or severe pain. MAXIMUM TOTAL ACETAMINOPHEN DOSE IS 4000 MG PER DAY 20 tablet 0   ibuprofen (ADVIL) 800 MG tablet Take 800 mg by mouth every 8 (eight) hours as needed for moderate pain.     sildenafil (VIAGRA) 100 MG tablet TAKE ONE TABLET BY MOUTH DAILY AS NEEDED FOR ERECTILE DYSFUNCTION 10 tablet 1   levothyroxine (SYNTHROID) 150 MCG tablet TAKE ONE TABLET BY MOUTH EVERY DAY BEFORE BREAKFAST 90 tablet 1   lisinopril (ZESTRIL) 20 MG tablet TAKE ONE TABLET BY MOUTH EVERY DAY 90 tablet 1   omeprazole (PRILOSEC) 20 MG capsule TAKE ONE CAPSULE BY MOUTH EVERY DAY 90 capsule 1   simvastatin (ZOCOR) 20 MG tablet TAKE ONE TABLET BY MOUTH EVERY DAY 90 tablet 1   No facility-administered medications prior to visit.    Not on File  ROS Review of Systems  Constitutional: Negative.   Eyes: Negative.   Respiratory: Negative.    Cardiovascular: Negative.   Endocrine: Negative.   Skin:  Negative for wound.  Neurological: Negative.   Psychiatric/Behavioral: Negative.       Objective:    Physical Exam HENT:     Head: Normocephalic and atraumatic.  Eyes:     Extraocular Movements: Extraocular movements intact.     Pupils: Pupils are equal, round, and reactive to light.  Cardiovascular:     Rate and Rhythm: Normal rate and regular rhythm.     Pulses: Normal pulses.     Heart sounds: Normal heart sounds.  Pulmonary:     Effort: Pulmonary effort is normal.     Breath sounds: Normal breath sounds.  Neurological:     General: No focal deficit present.     Mental Status: He is alert and oriented to person, place, and time. Mental status is at baseline.  Psychiatric:        Mood and Affect: Mood normal.        Behavior: Behavior normal.        Thought Content: Thought content normal.        Judgment: Judgment normal.    BP (!) 144/95 (BP  Location: Left Arm, Patient Position: Sitting, Cuff Size: Normal)   Pulse 82   Temp 98.1 F (36.7 C)   Ht 5\' 8"  (1.727 m)   Wt 226 lb (102.5 kg)   SpO2 96%   BMI 34.36 kg/m  Wt Readings from Last 3 Encounters:  01/27/21 226 lb (102.5 kg)  11/10/20 220 lb (99.8 kg)  11/02/20 216 lb (98 kg)  Encouraged weight loss  Health Maintenance Due  Topic Date Due   COVID-19 Vaccine (1) Never done   Pneumococcal Vaccine 41-67 Years old (1 - PCV) Never done   FOOT EXAM  Never done   OPHTHALMOLOGY EXAM  Never done   HIV Screening  Never done   Hepatitis C Screening  Never done   Zoster Vaccines- Shingrix (1 of 2) Never done   INFLUENZA VACCINE  10/18/2020    There are no preventive care reminders to display for this patient.     Lab Results  Component Value Date   CHOL 207 (H) 01/13/2021   Lab Results  Component Value Date   HDL 37 (L) 01/13/2021   Lab Results  Component Value Date   LDLCALC 131 (H) 01/13/2021   Lab Results  Component Value Date   TRIG 219 (H) 01/13/2021   Lab Results  Component Value Date   CHOLHDL 5.6 (H) 01/13/2021   Lab Results  Component Value Date   HGBA1C 5.3 01/13/2021      Assessment & Plan:   Mixed hyperlipidemia - Plan: simvastatin (ZOCOR) 20 MG tablet; lipids improved. Rx RF for 6 months.  Chronic GERD - Plan: omeprazole (PRILOSEC) 20 MG capsule; Rx RF for 6 months. Doing well  Benign hypertension - Plan: lisinopril (ZESTRIL) 20 MG tablet; BP elevated tonight. Resume BP check 1-2 times wkly and RTO in 1 mo for BP recheck/bring home BPs  Hypothyroidism, unspecified type - Plan: levothyroxine (SYNTHROID) 150 MCG tablet, TSH + free T4; recheck labs at f/u BP appt. Rx RF for now  Meds ordered this encounter  Medications   simvastatin (ZOCOR) 20 MG tablet    Sig: TAKE ONE TABLET BY MOUTH EVERY DAY    Dispense:  90 tablet    Refill:  1    Order Specific Question:   Supervising Provider    Answer:   Gae Dry [[id number]]    omeprazole (PRILOSEC) 20 MG capsule    Sig: TAKE ONE CAPSULE BY MOUTH EVERY DAY    Dispense:  90 capsule    Refill:  1    Order Specific Question:   Supervising Provider    Answer:   Gae Dry [[id number]]   lisinopril (ZESTRIL) 20 MG tablet    Sig: TAKE ONE TABLET BY MOUTH EVERY DAY    Dispense:  30 tablet    Refill:  0    Order Specific Question:   Supervising Provider    Answer:   Gae Dry [[id number]]   levothyroxine (SYNTHROID) 150 MCG tablet    Sig: TAKE ONE TABLET BY MOUTH EVERY DAY BEFORE BREAKFAST    Dispense:  90 tablet    Refill:  0    Order Specific Question:   Supervising Provider    Answer:   Gae Dry [[id number]]     Follow-up: Return in about 4 weeks (around 02/24/2021) for BP chck/TSH labs.    Aikam Vinje, PA-C

## 2021-01-27 NOTE — Therapy (Signed)
[place] [facility] [address], [place], [address] Phone: [phone]   Fax:  [phone]  Occupational Therapy Treatment  Patient Details  Name: Douglas Bird MRN: [id number] Date of Birth: [birth year] Referring Provider (OT): DR poggi   Encounter Date: 01/27/2021   OT End of Session - 01/27/21 1156     Visit Number 4    Number of Visits 10    Date for OT Re-Evaluation 03/18/21    OT Start Time 0820    OT Stop Time 0900    OT Time Calculation (min) 40 min    Activity Tolerance Patient tolerated treatment well    Behavior During Therapy [facility] for tasks assessed/performed             Past Medical History:  Diagnosis Date   Diverticulitis 2011   GERD (gastroesophageal reflux disease)    Hyperlipidemia    Hypertension    controlled on meds   Hypothyroidism    Thyroid disease     Past Surgical History:  Procedure Laterality Date   CHOLECYSTECTOMY  June 2013   COLONOSCOPY WITH PROPOFOL N/A 04/28/2019   Procedure: COLONOSCOPY WITH PROPOFOL;  Surgeon: Lin Landsman, MD;  Location: [place];  Service: Endoscopy;  Laterality: N/A;  priority 4   HERNIA REPAIR Bilateral    inguinal   REPAIR EXTENSOR TENDON Right 04/28/2020   Procedure: EXPLORATION OF RIGHT DORSAL HAND LACERATION WITH REPAIR OF INDEX EXTENSOR TENDON LACERATION;  Surgeon: Corky Mull, MD;  Location: [facility];  Service: Orthopedics;  Laterality: Right;   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   Subjective Assessment - 01/27/21 1154      Subjective  I done like you told me after last time only contrast, voltaren ointment , motion - no gripping , lifting , pushing or pulling - no putty or weight or pushing thru my palm- swelling and pain better- but nerve tender over on back of hand -and numbness in the webspace between my index and middle finger    Pertinent History Pt had saw accident on 04/26/20 at [facility] and then Surgery by Dr Roland Rack on 04/28/20 - Primary repair of right EIP and index EDC tendons.2. Irrigation and debridement with primary repair of right dorsal hand laceration (approximately 10 cm in length). Pt was seen for OT and cont to had nerve pain -11/10/20 had traumatic Neuroma excision - and refer to OT now for work hardening - pt is 8 1/2 wks s/p    Patient Stated Goals Want to get the use of my R hand back to be able to work and take care of the animals and yard /garden at home    Currently in Pain? Yes   1 coming in and increase to 4/10 with simulating hammering               [facility] OT Assessment - 01/27/21 0001       AROM   Right Wrist Extension 60 Degrees   70 after heat   Right Wrist Flexion 80 Degrees  Strength   Right Hand Grip (lbs) 74   pain 2/10   Right Hand Lateral Pinch 30 lbs    Right Hand 3 Point Pinch 21 lbs   pain 2/10   Left Hand Grip (lbs) 85    Left Hand Lateral Pinch 27 lbs    Left Hand 3 Point Pinch 18 lbs      Right Hand AROM   R Index  MCP 0-90 75 Degrees    R Index PIP 0-100 95 Degrees    R Long  MCP 0-90 75 Degrees   -10   R Long PIP 0-100 95 Degrees   -10   R Ring PIP 0-100 75 Degrees    R Ring DIP 0-70 100 Degrees    R Little PIP 0-100 85 Degrees    R Little DIP 0-70 90 Degrees             Pt cont to show decrease wrist extention , and composite flexion - mostly MC's - but after fluido increase AROM to composite fist and wrist ext 70 - pain free Slight pull only  Tinel positive and hyper sensitive to textures over dorsal 2nd and 3rd MC's coming in - but decrease  after fluido  Radial N glide 1-2 /10 after Fluido -         OT Treatments/Exercises (OP) - 01/27/21 0001       LUE Fluidotherapy   Number Minutes Fluidotherapy 8 Minutes    LUE Fluidotherapy Location Hand;Wrist    Comments AROM for wrist flexion, ext and tendon glides - decrease Tinel after heat             Table slides done this date  And then simulated some activities using large wrench - no issues doing 6 lbs and 120 sec  And pliers - pulling and twisting- 1/10  Hammering into towel 60 sec - pain increase to 4/10 - 16oz hammer   HEP  1-2 x day  Heat - hand open and in fist  Prior to flexion of digits - tendon glides -and wrist flexion  Then combine the 2 but stop when feeling pull of 1/10  Followed by radial N glide- 5 reps - 3 x day - stop at 1/10 - open hand and close fist    Prayer stretch after massage for composite extention  And then table slides - 12 reps - pain free  And use hand in activities gradually -but not vibration or hammering         OT Education - 01/27/21 1156     Education Details changes in HEP    Person(s) Educated Patient    Methods Explanation;Demonstration;Tactile cues;Verbal cues;Handout    Comprehension Verbal cues required;Returned demonstration;Verbalized understanding              OT Short Term Goals - 01/07/21 1305       OT SHORT TERM GOAL #1   Title Pt to be independent in HEP and desentitization to tolerate scar massage and different textures    Baseline pain and tenderness to scar cont to be 5-8/10 - scar massage and textures- vibration tools or repetitive gripping and release    Time 6    Period Weeks    Status New    Target Date 02/18/21               OT Long Term Goals - 01/07/21 1306       OT LONG TERM GOAL #1   Title Pt  R hand digits flexion increase for pt to touch palm to grip and release IADLs objects with pain less than 2-3/10    Baseline composite flexion and extention decrease compare to few  months ago prior to last surgery - pain 5-8/10 with  gripping tools/ release when using force , hammering and vibration    Time 10    Period Weeks    Status New    Target Date 03/18/21                   Plan - 01/27/21 1157     Clinical Impression Statement Pt is about 8 months  s/p right repair of 2nd digit EIP and EDC with irrigation and debridement.  Pt was seen earlier this year but had increase pain from traumatic neuroma of digital Radial N - that he had excition done on 11/10/20 - at eval pt 8 1/2 wks s/p  showed AROM in wrist and digits WFL -but composite flexion and exention are decrease compare to last session prior to surgery - some scar adhesion at end range - and pt  was stil very tender over incision and nerve for touch, textures and more pain/discomfort with release of fist or pinch than flexion. Pt report pain  was l 5-8/10 with any vibration or repetitive gripping of R hand in activiites - pt grip and prehension strength increase compare to prior to last surgery-   2 WEEKS AGO he showed progress in tenderenss over scar,  smaller area , composite flexion of digits , and wrist /digits extention improved- grip and prehension increased - and able to do light hammering and small chain saw some- Had pt work on light composite Radial N glide- with great success - done some taping over scar for scar mobs the last 2 wks  - BUT LAST WEEK pt arrive with increase pain , swelling and tendernes/Tinel over dorsal 2nd MC and hand- pt report he hit pole in the ground with tbar yesterday -and after 2nd or 3rd had increase pain , swelling  -heard pop- and had last night deep pain - done pain medications-  increase tenderness, pain , increase pain with Radial N glide - and swelling - pt also wiht some numbness repors in webspace of 2nd and 3rd-  THIS OT did talked to Dr  Roland Rack - per pt did not talk to DR Poggi- THIS date pt with less pain at rest and AROM - but positive Tinel over 2nd and 3rd MC  and  hyper sensitivy to textures- cont numbness in webspace of 2nd and 3rd - simulate this date using tools- screwdriver and plyers were okay -but hammering into towel - pain increase to 4/10 -  Pt to c- ont HEP for heat , AROM and stretches - and use hand but no vibration, hammering   pt cont to be limited by nerve pain in IADL's and he needs to be able to do full duty going back to work .    OT Occupational Profile and History Problem Focused Assessment - Including review of records relating to presenting problem    Occupational performance deficits (Please refer to evaluation for details): ADL's;IADL's;Rest and Sleep;Work;Play;Leisure;Social Participation    Body Structure / Function / Physical Skills ADL;Edema;Dexterity;Decreased knowledge of use of DME;Decreased knowledge of precautions;Flexibility;ROM;UE functional use;Scar mobility;Pain;Strength;IADL;Coordination    Rehab Potential Good    Clinical Decision Making Limited treatment options, no task modification necessary    Comorbidities Affecting Occupational Performance: None  Modification or Assistance to Complete Evaluation  No modification of tasks or assist necessary to complete eval    OT Frequency 1x / week    OT Duration 8 weeks    OT Treatment/Interventions Self-care/ADL training;Contrast Bath;Fluidtherapy;Moist Heat;Paraffin;Therapeutic exercise;Ultrasound;DME and/or AE instruction;Manual Therapy;Passive range of motion;Scar mobilization;Splinting;Patient/family education    Consulted and Agree with Plan of Care Patient             Patient will benefit from skilled therapeutic intervention in order to improve the following deficits and impairments:   Body Structure / Function / Physical Skills: ADL, Edema, Dexterity, Decreased knowledge of use of DME, Decreased knowledge of precautions, Flexibility, ROM, UE functional use, Scar mobility, Pain, Strength, IADL, Coordination       Visit Diagnosis: Pain in right hand  Scar  condition and fibrosis of skin  Stiffness of right wrist, not elsewhere classified  Stiffness of right hand, not elsewhere classified  Muscle weakness (generalized)    Problem List Patient Active Problem List   Diagnosis Date Noted   Toothache 02/20/2020   Headache 02/20/2020   Smoking 26/71/2458   Umbilical hernia, incarcerated    Back pain 03/27/2019   Benign hypertension 05/02/2018   Obesity (BMI 30.0-34.9) 04/30/2018   Healthcare maintenance 10/19/2016   Flu vaccine need 04/20/2016   GERD (gastroesophageal reflux disease) 04/01/2015   Gout 04/01/2015   Mixed hyperlipidemia 10/01/2014   Hypothyroidism 05/21/2014    Rosalyn Gess, OTR/L,CLT 01/27/2021, 12:04 PM  [place] [facility] [address] [place], [address] Phone: [phone]   Fax:  [phone]  Name: Douglas Bird MRN: [id number] Date of Birth: [birth year]

## 2021-01-28 ENCOUNTER — Other Ambulatory Visit: Payer: Self-pay

## 2021-01-29 DIAGNOSIS — M25641 Stiffness of right hand, not elsewhere classified: Secondary | ICD-10-CM | POA: Diagnosis present

## 2021-01-29 DIAGNOSIS — M25631 Stiffness of right wrist, not elsewhere classified: Secondary | ICD-10-CM | POA: Diagnosis present

## 2021-01-29 DIAGNOSIS — M79641 Pain in right hand: Secondary | ICD-10-CM | POA: Diagnosis present

## 2021-01-29 DIAGNOSIS — L905 Scar conditions and fibrosis of skin: Secondary | ICD-10-CM | POA: Diagnosis present

## 2021-01-29 DIAGNOSIS — M6281 Muscle weakness (generalized): Secondary | ICD-10-CM | POA: Diagnosis present

## 2021-01-31 ENCOUNTER — Ambulatory Visit: Payer: Self-pay

## 2021-01-31 ENCOUNTER — Other Ambulatory Visit: Payer: Self-pay

## 2021-01-31 DIAGNOSIS — Z79899 Other long term (current) drug therapy: Secondary | ICD-10-CM

## 2021-01-31 NOTE — Progress Notes (Signed)
Medication Management Clinic Visit Note  Patient: RESHAWN OSTLUND MRN: [id number] Date of Birth: [birth year] PCP: Patient, No Pcp Per (Inactive)   Selinda Orion 55 y.o. male presents for a yearly MTM visit today. Patient was identified with two patient identifiers (name and DOB)   There were no vitals taken for this visit.  Patient Information   Past Medical History:  Diagnosis Date   Diverticulitis 2011   GERD (gastroesophageal reflux disease)    Hyperlipidemia    Hypertension    controlled on meds   Hypothyroidism    Thyroid disease       Past Surgical History:  Procedure Laterality Date   CHOLECYSTECTOMY  June 2013   COLONOSCOPY WITH PROPOFOL N/A 04/28/2019   Procedure: COLONOSCOPY WITH PROPOFOL;  Surgeon: Lin Landsman, MD;  Location: [place];  Service: Endoscopy;  Laterality: N/A;  priority 4   HERNIA REPAIR Bilateral    inguinal   REPAIR EXTENSOR TENDON Right 04/28/2020   Procedure: EXPLORATION OF RIGHT DORSAL HAND LACERATION WITH REPAIR OF INDEX EXTENSOR TENDON LACERATION;  Surgeon: Corky Mull, MD;  Location: [facility];  Service: Orthopedics;  Laterality: Right;   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   Procedure: 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.;  Surgeon: Corky Mull, MD;  Location: [facility];  Service: Orthopedics;  Laterality: Right;   UMBILICAL HERNIA REPAIR N/A 08/11/2019   Procedure: HERNIA REPAIR UMBILICAL ADULT;  Surgeon: Fredirick Maudlin, MD;  Location: [facility];  Service: General;  Laterality: N/A;  RNFA     Family History  Problem Relation Age of Onset   Stroke Father    Gout Father    Aneurysm Father        2006   Thyroid disease Father    Epilepsy Father    Thyroid disease Sister    Diabetes Paternal Grandfather    Cancer Mother     New Diagnoses (since last visit):   Family Support: Good  Lifestyle Diet: Breakfast:usually skips, breakfast bars  Lunch:turkey, fish, salad, chicken  tenders  Dinner:steaks, salad, baked potato  Drinks:coffee (rarely), water            Social History   Substance and Sexual Activity  Alcohol Use Yes   Alcohol/week: 12.0 standard drinks   Types: 12 Cans of beer per week   Comment: 12 a week "if that"      Social History   Tobacco Use  Smoking Status Former   Packs/day: 0.25   Years: 15.00   Pack years: 3.75   Types: Cigarettes   Quit date: 02/17/2020   Years since quitting: 0.9  Smokeless Tobacco Former  Tobacco Comments   quit 8 weeks ago      Health Maintenance  Topic Date Due   COVID-19 Vaccine (1) Never done   Pneumococcal Vaccine 41-25 Years old (1 - PCV) Never done   FOOT EXAM  Never done   OPHTHALMOLOGY EXAM  Never done   HIV Screening  Never done   Hepatitis C Screening  Never done   Zoster Vaccines- Shingrix (1 of 2) Never done   INFLUENZA VACCINE  10/18/2020   HEMOGLOBIN A1C  07/14/2021   COLONOSCOPY (Pts 45-13yrs Insurance coverage will need to be confirmed)  04/27/2029   TETANUS/TDAP  04/26/2030   HPV VACCINES  Aged Out   Health Maintenance/Date Completed  Last ED visit: 11/10/2020 Last Visit to PCP: 01/27/2021 Next Visit to PCP: Dec 2022 Specialist Visit: 02/07/2021  Dental Exam: ~1 year ago  Eye Exam: 5 years ago  Prostate Exam: ~3 years ago  Pelvic/PAP Exam: N/A Mammogram: N/A DEXA: N/A Colonoscopy: ~3 year ago Flu Vaccine: not received  Pneumonia Vaccine: not received  COVID-19 Vaccine: received 3 Shingrix Vaccine: not received    Outpatient Encounter Medications as of 01/31/2021  Medication Sig   hydrochlorothiazide (MICROZIDE) 12.5 MG capsule TAKE ONE CAPSULE (12.5 MG) BY MOUTH EVERY DAY   HYDROcodone-acetaminophen (NORCO) 5-325 MG tablet Take 1-2 tablets by mouth every 6 (six) hours as needed for moderate pain or severe pain. MAXIMUM TOTAL ACETAMINOPHEN DOSE IS 4000 MG PER DAY   ibuprofen (ADVIL) 800 MG tablet Take 800 mg by mouth every 8 (eight) hours as needed for moderate  pain.   levothyroxine (SYNTHROID) 150 MCG tablet TAKE ONE TABLET BY MOUTH EVERY DAY BEFORE BREAKFAST   lisinopril (ZESTRIL) 20 MG tablet TAKE ONE TABLET BY MOUTH ONCE EVERY DAY.   omeprazole (PRILOSEC) 20 MG capsule TAKE ONE CAPSULE BY MOUTH ONCE EVERY DAY.   sildenafil (VIAGRA) 100 MG tablet TAKE ONE TABLET BY MOUTH DAILY AS NEEDED FOR ERECTILE DYSFUNCTION   simvastatin (ZOCOR) 20 MG tablet TAKE ONE TABLET BY MOUTH ONCE EVERY DAY.   No facility-administered encounter medications on file as of 01/31/2021.    Assessment and Plan: Medication adherence: patient reports good adherence to medications with no missed doses. Patient is able to recall name, indication, directions, and strength appropriately.  HTN --Currently receiving hydrochlorothiazide and lisinopril and reports no issues  --Blood pressure 144/95 at last appointment at [facility], and patient was instructed to continue to monitor blood pressure at home --Reports checking blood pressure twice weekly, and will record values to report to primary care at next follow-up appointment  HLD --Currently receiving simvastatin, and reports no issues  --Last LDL 131 on 01/13/2021 --Encouraged patient to continue making dietary changes  GERD --Currently receiving omeprazole and reports no issues  Hypothyroidism  --Currently receiving levothyroxine and reports no issues  Pain --Currently receiving as needed hydrocodone-acetaminophen and as needed ibuprofen for hand injury --Patient reports medications do help with pain, and reports rare use of the opioid  --Patient as follow-up appointments scheduled with physical therapy and surgeon    Patient was congratulated on smoking cessation and dietary changes. Encouraged patient to continue making dietary changes and daily exercise as tolerated. Provided education on importance of regular dental and vision exams. Patient was agreeable to all suggestions. Plan to return to clinic in 1 year for annual MTM.    RTC: 1 year  Narda Rutherford, PharmD Pharmacy Resident  01/31/2021 10:45 AM

## 2021-02-03 ENCOUNTER — Ambulatory Visit: Payer: PRIVATE HEALTH INSURANCE | Admitting: Occupational Therapy

## 2021-02-03 DIAGNOSIS — M25631 Stiffness of right wrist, not elsewhere classified: Secondary | ICD-10-CM

## 2021-02-03 DIAGNOSIS — M79641 Pain in right hand: Secondary | ICD-10-CM

## 2021-02-03 DIAGNOSIS — L905 Scar conditions and fibrosis of skin: Secondary | ICD-10-CM

## 2021-02-03 DIAGNOSIS — M25641 Stiffness of right hand, not elsewhere classified: Secondary | ICD-10-CM

## 2021-02-03 NOTE — Therapy (Signed)
[facility] [address], [place], [address] Phone: [phone]   Fax:  [phone]  Occupational Therapy Treatment  Patient Details  Name: Douglas Bird MRN: [id number] Date of Birth: September 24, 1965 Referring Provider (OT): DR poggi   Encounter Date: 02/03/2021   OT End of Session - 02/03/21 1447     Visit Number 5    Number of Visits 10    Date for OT Re-Evaluation 03/18/21    OT Start Time 1400    OT Stop Time 1428    OT Time Calculation (min) 28 min    Activity Tolerance Patient tolerated treatment well    Behavior During Therapy [facility] for tasks assessed/performed             Past Medical History:  Diagnosis Date   Diverticulitis 2011   GERD (gastroesophageal reflux disease)    Hyperlipidemia    Hypertension    controlled on meds   Hypothyroidism    Thyroid disease     Past Surgical History:  Procedure Laterality Date   CHOLECYSTECTOMY  June 2013   COLONOSCOPY WITH PROPOFOL N/A 04/28/2019   Procedure: COLONOSCOPY WITH PROPOFOL;  Surgeon: Lin Landsman, MD;  Location: [facility];  Service: Endoscopy;  Laterality: N/A;  priority 4   HERNIA REPAIR Bilateral    inguinal   REPAIR EXTENSOR TENDON Right 04/28/2020   Procedure: EXPLORATION OF RIGHT DORSAL HAND LACERATION WITH REPAIR OF INDEX EXTENSOR TENDON LACERATION;  Surgeon: Corky Mull, MD;  Location: [facility];  Service: Orthopedics;  Laterality: Right;   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   Subjective Assessment - 02/03/21 1445      Subjective  It is still numb inbetween these 2 fingers and tender- if I touch or rubb or tap - done just the exercises you told me - no hard stuff around the house - appt with DR Poggi Monday    Pertinent History Pt had saw accident on 04/26/20 at [facility] and then Surgery by Dr Roland Rack on 04/28/20 - Primary repair of right EIP and index EDC tendons.2. Irrigation and debridement with primary repair of right dorsal hand laceration (approximately 10 cm in length). Pt was seen for OT and cont to had nerve pain -11/10/20 had traumatic Neuroma excision - and refer to OT now for work hardening - pt is 8 1/2 wks s/p    Patient Stated Goals Want to get the use of my R hand back to be able to work and take care of the animals and yard /garden at home    Currently in Pain? Yes    Pain Score 1     Pain Location Hand    Pain Orientation Right    Pain Descriptors / Indicators Tender;Aching;Tightness    Pain Type Surgical pain    Pain Onset More than a month ago                [facility] OT Assessment - 02/03/21 0001       AROM   Right Wrist Extension  60 Degrees   70 after fluido   Right Wrist Flexion 90 Degrees      Strength   Right Hand Grip (lbs) 82   1/10   Right Hand Lateral Pinch 30 lbs    Right Hand 3 Point Pinch 22 lbs   1/10 pain   Left Hand Grip (lbs) 90    Left Hand Lateral Pinch 27 lbs    Left Hand 3 Point Pinch 17 lbs      Right Hand AROM   R Index  MCP 0-90 75 Degrees   80 after fluido   R Index PIP 0-100 90 Degrees    R Long  MCP 0-90 75 Degrees   80 after fluido   R Long PIP 0-100 95 Degrees    R Ring PIP 0-100 75 Degrees   80 after fluido   R Ring DIP 0-70 95 Degrees    R Little PIP 0-100 80 Degrees    R Little DIP 0-70 90 Degrees                Pt cont to show some increase edema over dorsal 2nd and 3rd MC -after hitting post in ground 2-3 wks ago  decrease composite flexion - mostly MC's - but after fluido increase AROM to composite fist and wrist ext 70 - pain  free Slight pull only  Tinel positive and hyper sensitive to textures over dorsal 2nd and 3rd MC's - larger area since incident 2-3 wks ago - was only 1 cm area   Numbness in webspace of 2nd and 3rd since incident at home 2-3 wks ago Radial N glide 1-2 /10                       OT Treatments/Exercises (OP) - 02/03/21 0001       LUE Fluidotherapy   Number Minutes Fluidotherapy 8 Minutes    LUE Fluidotherapy Location Hand;Wrist    Comments AROM for wrist and digits                HEP review again with pt  1-2 x day  Heat - hand open and in fist  Prior to flexion of digits - tendon glides -and wrist flexion  Then combine the 2 but stop when feeling pull of 1/10  Followed by radial N glide- 5 reps - 3 x day - stop at 1/10 - open hand and close fist    Prayer stretch after massage for composite extention  And then table slides - 12 reps - pain free  And use hand in activities gradually -but no vibration or hammering  Follow up with surgeon           OT Education - 02/03/21 1447     Education Details changes in HEP    Person(s) Educated Patient    Methods Explanation;Demonstration;Tactile cues;Verbal cues;Handout    Comprehension Verbal cues required;Returned demonstration;Verbalized understanding              OT Short Term Goals - 01/07/21 1305       OT SHORT TERM GOAL #1   Title Pt to be independent in HEP and desentitization to tolerate scar massage and different textures    Baseline pain and tenderness to scar cont to be 5-8/10 - scar massage and textures- vibration tools or repetitive gripping and release    Time 6    Period Weeks    Status New    Target Date 02/18/21  OT Long Term Goals - 01/07/21 1306       OT LONG TERM GOAL #1   Title Pt R hand digits flexion increase for pt to touch palm to grip and release IADLs objects with pain less than 2-3/10    Baseline composite flexion and extention decrease compare to few  months ago prior to last surgery - pain 5-8/10 with  gripping tools/ release when using force , hammering and vibration    Time 10    Period Weeks    Status New    Target Date 03/18/21                   Plan - 02/03/21 1528     Clinical Impression Statement Pt is about 8 months  s/p right repair of 2nd digit EIP and EDC with irrigation and debridement.  Pt was seen earlier this year but had increase pain from traumatic neuroma of digital Radial N - that he had excition done on 11/10/20 - at eval pt 8 1/2 wks s/p  showed AROM in wrist and digits WFL -but composite flexion and exention were decrease compare to last session prior to surgery - some scar adhesion at end range - and pt  was stil very tender over incision and nerve for touch, textures and more pain/discomfort with release of fist or pinch than flexion. Pt report pain  was  5-8/10 with any vibration or repetitive gripping of R hand in activiites - pt grip and prehension strength increase compare to prior to last surgery-   3 WEEKS AGO he showed progress in tenderenss over scar,  smaller area , composite flexion of digits , and wrist /digits extention improved- grip and prehension increased - and able to do light hammering and small chain saw some- Had pt work on light composite Radial N glide- with great success - done some taping over scar for scar mobs the last 2 wks  - BUT 2  WEEKS AGO pt arrive with increase pain , swelling and tendernes/Tinel over dorsal 2nd MC and hand- pt report he hit pole in the ground with tbar yesterday -and after 2nd or 3rd had increase pain , swelling  -heard pop- and had last night deep pain - done pain medications-  increase tenderness, pain , increase pain with Radial N glide - and swelling - pt also wiht some numbness repors in webspace of 2nd and 3rd-  Did talked to  Dr  Roland Rack - per pt  he has appt next Monday with Dr Roland Rack- THIS DATE- pt with grip and prehension strenght WNL for age, pain with AROM 1/10  - decrease MC flexion composite at [facility] but after fluido have AROM WNL - BUT positive Tinel over 2nd and 3rd MC  and hyper sensitivy to textures larger area now since 3 wks ago and NEW onset of numbness in webspace of 2nd and 3rd since incident - Vibration, hammering , impact cont to bother him severely -   pt cont to be limited by nerve pain in IADL's and he needs to be able to do full duty going back to work .    OT Occupational Profile and History Problem Focused Assessment - Including review of records relating to presenting problem    Occupational performance deficits (Please refer to evaluation for details): ADL's;IADL's;Rest and Sleep;Work;Play;Leisure;Social Participation    Body Structure / Function / Physical Skills ADL;Edema;Dexterity;Decreased knowledge of use of DME;Decreased knowledge of precautions;Flexibility;ROM;UE functional use;Scar mobility;Pain;Strength;IADL;Coordination    Rehab  Potential Fair    Clinical Decision Making Limited treatment options, no task modification necessary    Comorbidities Affecting Occupational Performance: None    Modification or Assistance to Complete Evaluation  No modification of tasks or assist necessary to complete eval    OT Treatment/Interventions Self-care/ADL training;Contrast Bath;Fluidtherapy;Moist Heat;Paraffin;Therapeutic exercise;Ultrasound;DME and/or AE instruction;Manual Therapy;Passive range of motion;Scar mobilization;Splinting;Patient/family education    Consulted and Agree with Plan of Care Patient             Patient will benefit from skilled therapeutic intervention in order to improve the following deficits and impairments:   Body Structure / Function / Physical Skills: ADL, Edema, Dexterity, Decreased knowledge of use of DME, Decreased knowledge of precautions, Flexibility, ROM, UE functional use, Scar mobility, Pain, Strength, IADL, Coordination       Visit Diagnosis: Pain in right hand  Scar condition and fibrosis of  skin  Stiffness of right wrist, not elsewhere classified  Stiffness of right hand, not elsewhere classified    Problem List Patient Active Problem List   Diagnosis Date Noted   Toothache 02/20/2020   Headache 02/20/2020   Smoking 04/30/1550   Umbilical hernia, incarcerated    Back pain 03/27/2019   Benign hypertension 05/02/2018   Obesity (BMI 30.0-34.9) 04/30/2018   Healthcare maintenance 10/19/2016   Flu vaccine need 04/20/2016   GERD (gastroesophageal reflux disease) 04/01/2015   Gout 04/01/2015   Mixed hyperlipidemia 10/01/2014   Hypothyroidism 05/21/2014    Rosalyn Gess, OTR/L,CLT 02/03/2021, 4:16 PM  [facility] [address] [place], [address] Phone: [phone]   Fax:  [phone]  Name: Douglas Bird MRN: [id number] Date of Birth: May 20, 1965

## 2021-02-04 ENCOUNTER — Encounter: Payer: Self-pay | Admitting: Occupational Therapy

## 2021-02-14 ENCOUNTER — Other Ambulatory Visit: Payer: Self-pay | Admitting: Gerontology

## 2021-02-14 ENCOUNTER — Other Ambulatory Visit: Payer: Self-pay

## 2021-02-17 ENCOUNTER — Other Ambulatory Visit: Payer: Self-pay | Admitting: Obstetrics and Gynecology

## 2021-02-17 ENCOUNTER — Other Ambulatory Visit: Payer: Self-pay

## 2021-02-17 DIAGNOSIS — E039 Hypothyroidism, unspecified: Secondary | ICD-10-CM

## 2021-02-18 ENCOUNTER — Other Ambulatory Visit: Payer: Self-pay

## 2021-02-23 ENCOUNTER — Other Ambulatory Visit: Payer: Self-pay

## 2021-02-24 ENCOUNTER — Other Ambulatory Visit: Payer: Self-pay

## 2021-02-24 DIAGNOSIS — E039 Hypothyroidism, unspecified: Secondary | ICD-10-CM

## 2021-02-25 LAB — TSH+FREE T4
Free T4: 1.13 ng/dL (ref 0.82–1.77)
TSH: 9.13 u[IU]/mL — ABNORMAL HIGH (ref 0.450–4.500)

## 2021-03-03 ENCOUNTER — Other Ambulatory Visit: Payer: Self-pay

## 2021-03-03 ENCOUNTER — Ambulatory Visit: Payer: Self-pay | Admitting: Gerontology

## 2021-03-03 VITALS — BP 152/99 | HR 84 | Temp 97.4°F | Resp 16 | Ht 68.0 in | Wt 229.1 lb

## 2021-03-03 DIAGNOSIS — E039 Hypothyroidism, unspecified: Secondary | ICD-10-CM

## 2021-03-03 DIAGNOSIS — E782 Mixed hyperlipidemia: Secondary | ICD-10-CM

## 2021-03-03 DIAGNOSIS — N529 Male erectile dysfunction, unspecified: Secondary | ICD-10-CM

## 2021-03-03 DIAGNOSIS — I10 Essential (primary) hypertension: Secondary | ICD-10-CM

## 2021-03-03 MED ORDER — ATORVASTATIN CALCIUM 40 MG PO TABS
40.0000 mg | ORAL_TABLET | Freq: Every day | ORAL | 0 refills | Status: DC
Start: 2021-03-03 — End: 2021-03-23
  Filled 2021-03-03: qty 90, 90d supply, fill #0

## 2021-03-03 MED ORDER — LISINOPRIL 20 MG PO TABS
ORAL_TABLET | Freq: Every day | ORAL | 1 refills | Status: DC
  Filled 2021-03-03: qty 90, 90d supply, fill #0

## 2021-03-03 MED ORDER — LEVOTHYROXINE SODIUM 150 MCG PO TABS
ORAL_TABLET | ORAL | 0 refills | Status: DC
  Filled 2021-03-03: qty 90, fill #0
  Filled 2021-04-29: qty 90, 90d supply, fill #0

## 2021-03-03 MED FILL — Sildenafil Citrate Tab 100 MG: ORAL | 30 days supply | Qty: 10 | Fill #0 | Status: AC

## 2021-03-03 NOTE — Patient Instructions (Signed)
Heart-Healthy Eating Plan Heart-healthy meal planning includes: Eating less unhealthy fats. Eating more healthy fats. Making other changes in your diet. Talk with your doctor or a diet specialist (dietitian) to create an eating plan that is right for you. What is my plan? Your doctor may recommend an eating plan that includes: Total fat: ______% or less of total calories a day. Saturated fat: ______% or less of total calories a day. Cholesterol: less than _________mg a day. What are tips for following this plan? Cooking Avoid frying your food. Try to bake, boil, grill, or broil it instead. You can also reduce fat by: Removing the skin from poultry. Removing all visible fats from meats. Steaming vegetables in water or broth. Meal planning  At meals, divide your plate into four equal parts: Fill one-half of your plate with vegetables and green salads. Fill one-fourth of your plate with whole grains. Fill one-fourth of your plate with lean protein foods. Eat 4-5 servings of vegetables per day. A serving of vegetables is: 1 cup of raw or cooked vegetables. 2 cups of raw leafy greens. Eat 4-5 servings of fruit per day. A serving of fruit is: 1 medium whole fruit.  cup of dried fruit.  cup of fresh, frozen, or canned fruit.  cup of 100% fruit juice. Eat more foods that have soluble fiber. These are apples, broccoli, carrots, beans, peas, and barley. Try to get 20-30 g of fiber per day. Eat 4-5 servings of nuts, legumes, and seeds per week: 1 serving of dried beans or legumes equals  cup after being cooked. 1 serving of nuts is  cup. 1 serving of seeds equals 1 tablespoon. General information Eat more home-cooked food. Eat less restaurant, buffet, and fast food. Limit or avoid alcohol. Limit foods that are high in starch and sugar. Avoid fried foods. Lose weight if you are overweight. Keep track of how much salt (sodium) you eat. This is important if you have high blood  pressure. Ask your doctor to tell you more about this. Try to add vegetarian meals each week. Fats Choose healthy fats. These include olive oil and canola oil, flaxseeds, walnuts, almonds, and seeds. Eat more omega-3 fats. These include salmon, mackerel, sardines, tuna, flaxseed oil, and ground flaxseeds. Try to eat fish at least 2 times each week. Check food labels. Avoid foods with trans fats or high amounts of saturated fat. Limit saturated fats. These are often found in animal products, such as meats, butter, and cream. These are also found in plant foods, such as palm oil, palm kernel oil, and coconut oil. Avoid foods with partially hydrogenated oils in them. These have trans fats. Examples are stick margarine, some tub margarines, cookies, crackers, and other baked goods. What foods can I eat? Fruits All fresh, canned (in natural juice), or frozen fruits. Vegetables Fresh or frozen vegetables (raw, steamed, roasted, or grilled). Green salads. Grains Most grains. Choose whole wheat and whole grains most of the time. Rice and pasta, including brown rice and pastas made with whole wheat. Meats and other proteins Lean, well-trimmed beef, veal, pork, and lamb. Chicken and [place] without skin. All fish and shellfish. Wild duck, rabbit, pheasant, and venison. Egg whites or low-cholesterol egg substitutes. Dried beans, peas, lentils, and tofu. Seeds and most nuts. Dairy Low-fat or nonfat cheeses, including ricotta and mozzarella. Skim or 1% milk that is liquid, powdered, or evaporated. Buttermilk that is made with low-fat milk. Nonfat or low-fat yogurt. Fats and oils Non-hydrogenated (trans-free) margarines. Vegetable oils, including  soybean, sesame, sunflower, olive, peanut, safflower, corn, canola, and cottonseed. Salad dressings or mayonnaise made with a vegetable oil. Beverages Mineral water. Coffee and tea. Diet carbonated beverages. Sweets and desserts Sherbet, gelatin, and fruit ice.  Small amounts of dark chocolate. Limit all sweets and desserts. Seasonings and condiments All seasonings and condiments. The items listed above may not be a complete list of foods and drinks you can eat. Contact a dietitian for more options. What foods should I avoid? Fruits Canned fruit in heavy syrup. Fruit in cream or butter sauce. Fried fruit. Limit coconut. Vegetables Vegetables cooked in cheese, cream, or butter sauce. Fried vegetables. Grains Breads that are made with saturated or trans fats, oils, or whole milk. Croissants. Sweet rolls. Donuts. High-fat crackers, such as cheese crackers. Meats and other proteins Fatty meats, such as hot dogs, ribs, sausage, bacon, rib-eye roast or steak. High-fat deli meats, such as salami and bologna. Caviar. Domestic duck and goose. Organ meats, such as liver. Dairy Cream, sour cream, cream cheese, and creamed cottage cheese. Whole-milk cheeses. Whole or 2% milk that is liquid, evaporated, or condensed. Whole buttermilk. Cream sauce or high-fat cheese sauce. Yogurt that is made from whole milk. Fats and oils Meat fat, or shortening. Cocoa butter, hydrogenated oils, palm oil, coconut oil, palm kernel oil. Solid fats and shortenings, including bacon fat, salt pork, lard, and butter. Nondairy cream substitutes. Salad dressings with cheese or sour cream. Beverages Regular sodas and juice drinks with added sugar. Sweets and desserts Frosting. Pudding. Cookies. Cakes. Pies. Milk chocolate or white chocolate. Buttered syrups. Full-fat ice cream or ice cream drinks. The items listed above may not be a complete list of foods and drinks to avoid. Contact a dietitian for more information. Summary Heart-healthy meal planning includes eating less unhealthy fats, eating more healthy fats, and making other changes in your diet. Eat a balanced diet. This includes fruits and vegetables, low-fat or nonfat dairy, lean protein, nuts and legumes, whole grains, and  heart-healthy oils and fats. This information is not intended to replace advice given to you by your health care provider. Make sure you discuss any questions you have with your health care provider. Document Revised: 07/15/2020 Document Reviewed: 07/15/2020 Elsevier Patient Education  2022 [place] Eating Plan DASH stands for Dietary Approaches to Stop Hypertension. The DASH eating plan is a healthy eating plan that has been shown to: Reduce high blood pressure (hypertension). Reduce your risk for type 2 diabetes, heart disease, and stroke. Help with weight loss. What are tips for following this plan? Reading food labels Check food labels for the amount of salt (sodium) per serving. Choose foods with less than 5 percent of the Daily Value of sodium. Generally, foods with less than 300 milligrams (mg) of sodium per serving fit into this eating plan. To find whole grains, look for the word "whole" as the first word in the ingredient list. Shopping Buy products labeled as "low-sodium" or "no salt added." Buy fresh foods. Avoid canned foods and pre-made or frozen meals. Cooking Avoid adding salt when cooking. Use salt-free seasonings or herbs instead of table salt or sea salt. Check with your health care provider or pharmacist before using salt substitutes. Do not fry foods. Cook foods using healthy methods such as baking, boiling, grilling, roasting, and broiling instead. Cook with heart-healthy oils, such as olive, canola, avocado, soybean, or sunflower oil. Meal planning  Eat a balanced diet that includes: 4 or more servings of fruits and 4 or more  servings of vegetables each day. Try to fill one-half of your plate with fruits and vegetables. 6-8 servings of whole grains each day. Less than 6 oz (170 g) of lean meat, poultry, or fish each day. A 3-oz (85-g) serving of meat is about the same size as a deck of cards. One egg equals 1 oz (28 g). 2-3 servings of low-fat dairy each  day. One serving is 1 cup (237 mL). 1 serving of nuts, seeds, or beans 5 times each week. 2-3 servings of heart-healthy fats. Healthy fats called omega-3 fatty acids are found in foods such as walnuts, flaxseeds, fortified milks, and eggs. These fats are also found in cold-water fish, such as sardines, salmon, and mackerel. Limit how much you eat of: Canned or prepackaged foods. Food that is high in trans fat, such as some fried foods. Food that is high in saturated fat, such as fatty meat. Desserts and other sweets, sugary drinks, and other foods with added sugar. Full-fat dairy products. Do not salt foods before eating. Do not eat more than 4 egg yolks a week. Try to eat at least 2 vegetarian meals a week. Eat more home-cooked food and less restaurant, buffet, and fast food. Lifestyle When eating at a restaurant, ask that your food be prepared with less salt or no salt, if possible. If you drink alcohol: Limit how much you use to: 0-1 drink a day for women who are not pregnant. 0-2 drinks a day for men. Be aware of how much alcohol is in your drink. In the [place] one drink equals one 12 oz bottle of beer (355 mL), one 5 oz glass of wine (148 mL), or one 1 oz glass of hard liquor (44 mL). General information Avoid eating more than 2,300 mg of salt a day. If you have hypertension, you may need to reduce your sodium intake to 1,500 mg a day. Work with your health care provider to maintain a healthy body weight or to lose weight. Ask what an ideal weight is for you. Get at least 30 minutes of exercise that causes your heart to beat faster (aerobic exercise) most days of the week. Activities may include walking, swimming, or biking. Work with your health care provider or dietitian to adjust your eating plan to your individual calorie needs. What foods should I eat? Fruits All fresh, dried, or frozen fruit. Canned fruit in natural juice (without added sugar). Vegetables Fresh or frozen  vegetables (raw, steamed, roasted, or grilled). Low-sodium or reduced-sodium tomato and vegetable juice. Low-sodium or reduced-sodium tomato sauce and tomato paste. Low-sodium or reduced-sodium canned vegetables. Grains Whole-grain or whole-wheat bread. Whole-grain or whole-wheat pasta. Brown rice. Douglas Bird. Bulgur. Whole-grain and low-sodium cereals. Pita bread. Low-fat, low-sodium crackers. Whole-wheat flour tortillas. Meats and other proteins Skinless chicken or [place]. Ground chicken or [place]. Pork with fat trimmed off. Fish and seafood. Egg whites. Dried beans, peas, or lentils. Unsalted nuts, nut butters, and seeds. Unsalted canned beans. Lean cuts of beef with fat trimmed off. Low-sodium, lean precooked or cured meat, such as sausages or meat loaves. Dairy Low-fat (1%) or fat-free (skim) milk. Reduced-fat, low-fat, or fat-free cheeses. Nonfat, low-sodium ricotta or cottage cheese. Low-fat or nonfat yogurt. Low-fat, low-sodium cheese. Fats and oils Soft margarine without trans fats. Vegetable oil. Reduced-fat, low-fat, or light mayonnaise and salad dressings (reduced-sodium). Canola, safflower, olive, avocado, soybean, and sunflower oils. Avocado. Seasonings and condiments Herbs. Spices. Seasoning mixes without salt. Other foods Unsalted popcorn and pretzels. Fat-free sweets. The items  listed above may not be a complete list of foods and beverages you can eat. Contact a dietitian for more information. What foods should I avoid? Fruits Canned fruit in a light or heavy syrup. Fried fruit. Fruit in cream or butter sauce. Vegetables Creamed or fried vegetables. Vegetables in a cheese sauce. Regular canned vegetables (not low-sodium or reduced-sodium). Regular canned tomato sauce and paste (not low-sodium or reduced-sodium). Regular tomato and vegetable juice (not low-sodium or reduced-sodium). Douglas Bird. Olives. Grains Baked goods made with fat, such as croissants, muffins, or some  breads. Dry pasta or rice meal packs. Meats and other proteins Fatty cuts of meat. Ribs. Fried meat. Douglas Bird. Bologna, salami, and other precooked or cured meats, such as sausages or meat loaves. Fat from the back of a pig (fatback). Bratwurst. Salted nuts and seeds. Canned beans with added salt. Canned or smoked fish. Whole eggs or egg yolks. Chicken or [place] with skin. Dairy Whole or 2% milk, cream, and half-and-half. Whole or full-fat cream cheese. Whole-fat or sweetened yogurt. Full-fat cheese. Nondairy creamers. Whipped toppings. Processed cheese and cheese spreads. Fats and oils Butter. Stick margarine. Lard. Shortening. Ghee. Bacon fat. Tropical oils, such as coconut, palm kernel, or palm oil. Seasonings and condiments Onion salt, garlic salt, seasoned salt, table salt, and sea salt. Worcestershire sauce. Tartar sauce. Barbecue sauce. Teriyaki sauce. Soy sauce, including reduced-sodium. Steak sauce. Canned and packaged gravies. Fish sauce. Oyster sauce. Cocktail sauce. Store-bought horseradish. Ketchup. Mustard. Meat flavorings and tenderizers. Bouillon cubes. Hot sauces. Pre-made or packaged marinades. Pre-made or packaged taco seasonings. Relishes. Regular salad dressings. Other foods Salted popcorn and pretzels. The items listed above may not be a complete list of foods and beverages you should avoid. Contact a dietitian for more information. Where to find more information National Heart, Lung, and Blood Institute: https://wilson-eaton.com/ American Heart Association: www.heart.org Academy of Nutrition and Dietetics: www.eatright.[place]: www.kidney.org Summary The DASH eating plan is a healthy eating plan that has been shown to reduce high blood pressure (hypertension). It may also reduce your risk for type 2 diabetes, heart disease, and stroke. When on the DASH eating plan, aim to eat more fresh fruits and vegetables, whole grains, lean proteins, low-fat dairy, and  heart-healthy fats. With the DASH eating plan, you should limit salt (sodium) intake to 2,300 mg a day. If you have hypertension, you may need to reduce your sodium intake to 1,500 mg a day. Work with your health care provider or dietitian to adjust your eating plan to your individual calorie needs. This information is not intended to replace advice given to you by your health care provider. Make sure you discuss any questions you have with your health care provider. Document Revised: 02/07/2019 Document Reviewed: 02/07/2019 Elsevier Patient Education  2022 [employer].

## 2021-03-03 NOTE — Progress Notes (Signed)
Established Patient Office Visit  Subjective:  Patient ID: Douglas Bird, male    DOB: November 28, 1965  Age: 55 y.o. MRN: [id number]  CC:  Chief Complaint  Patient presents with   Follow-up    Labs drawn 02/24/21   Medication Refill    HPI Douglas Bird is a 54 year old male who has history of diverticulitis, GERD, hyperlipidemia, hypertension, hypothyroidism, presents for routine follow-up visit , lab review and medication refill. He is compliant with his medication, denies side effects and continues to make dietary changes. His blood pressure was elevated during visit, states that he checks it  2 times a week and brought his log. His SBP is between 110- 133, and DBP was between 73- 91. He reports an episode of dizziness on 02/27/21 when he woke up . He states that it resolved after 4 hours, denies fall and his blood pressure was 136/100, 70. His TSH done on 02/24/21 was 9.130, he takes his Levothyroxine together with his hydrochlorothiazide. He denies any symptoms. Overall, he states that he's doing well and offers no further complaint.     Past Medical History:  Diagnosis Date   Diverticulitis 2011   GERD (gastroesophageal reflux disease)    Hyperlipidemia    Hypertension    controlled on meds   Hypothyroidism    Thyroid disease     Past Surgical History:  Procedure Laterality Date   CHOLECYSTECTOMY  June 2013   COLONOSCOPY WITH PROPOFOL N/A 04/28/2019   Procedure: COLONOSCOPY WITH PROPOFOL;  Surgeon: Lin Landsman, MD;  Location: [facility];  Service: Endoscopy;  Laterality: N/A;  priority 4   HERNIA REPAIR Bilateral    inguinal   REPAIR EXTENSOR TENDON Right 04/28/2020   Procedure: EXPLORATION OF RIGHT DORSAL HAND LACERATION WITH REPAIR OF INDEX EXTENSOR TENDON LACERATION;  Surgeon: Corky Mull, MD;  Location: [facility];  Service: Orthopedics;  Laterality: Right;   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   Procedure: 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.;  Surgeon: Corky Mull, MD;  Location: [facility];  Service: Orthopedics;  Laterality: Right;   UMBILICAL HERNIA REPAIR N/A 08/11/2019   Procedure: HERNIA REPAIR UMBILICAL ADULT;  Surgeon: Fredirick Maudlin, MD;  Location: [facility];  Service: General;  Laterality: N/A;  RNFA    Family History  Problem Relation Age of Onset   Stroke Father    Gout Father    Aneurysm Father        2006   Thyroid disease Father    Epilepsy Father    Thyroid disease Sister    Diabetes Paternal Grandfather    Cancer Mother     Social History   Socioeconomic History   Marital status: Significant Other    Spouse name: Not on file   Number of children: Not on file   Years of education: Not on file   Highest education level: Not on file  Occupational History   Not on file  Tobacco Use   Smoking status: Former    Packs/day: 0.25    Years: 15.00    Pack years: 3.75    Types: Cigarettes    Quit date: 02/17/2020    Years since quitting: 1.0   Smokeless tobacco: Never  Vaping Use   Vaping Use: Never used  Substance and Sexual Activity   Alcohol use: Yes    Alcohol/week: 12.0 standard drinks    Types: 12 Cans of beer per week    Comment:  12 a week "if that"   Drug use: No   Sexual activity: Not Currently  Other Topics Concern   Not on file  Social History Narrative   [place] )Girlfriend   Social Determinants of Health   Financial Resource Strain: Not on file  Food Insecurity: No Food Insecurity   Worried About Charity fundraiser in the Last Year: Never true   Arboriculturist in the Last Year: Never true  Transportation Needs: No Transportation Needs   Lack of Transportation (Medical): No   Lack of Transportation (Non-Medical): No  Physical Activity: Not on file  Stress: Not on file  Social Connections: Not on file  Intimate Partner Violence: Not on file    Outpatient Medications Prior to Visit  Medication Sig Dispense Refill   hydrochlorothiazide  (MICROZIDE) 12.5 MG capsule TAKE ONE CAPSULE (12.5 MG) BY MOUTH EVERY DAY 90 capsule 1   ibuprofen (ADVIL) 800 MG tablet Take 800 mg by mouth every 8 (eight) hours as needed for moderate pain.     omeprazole (PRILOSEC) 20 MG capsule TAKE ONE CAPSULE BY MOUTH ONCE EVERY DAY. 90 capsule 1   levothyroxine (SYNTHROID) 150 MCG tablet TAKE ONE TABLET BY MOUTH EVERY DAY BEFORE BREAKFAST 90 tablet 0   lisinopril (ZESTRIL) 20 MG tablet TAKE ONE TABLET BY MOUTH ONCE EVERY DAY. 30 tablet 0   sildenafil (VIAGRA) 100 MG tablet TAKE ONE TABLET BY MOUTH DAILY AS NEEDED FOR ERECTILE DYSFUNCTION 10 tablet 1   simvastatin (ZOCOR) 20 MG tablet TAKE ONE TABLET BY MOUTH ONCE EVERY DAY. 90 tablet 1   HYDROcodone-acetaminophen (NORCO) 5-325 MG tablet Take 1-2 tablets by mouth every 6 (six) hours as needed for moderate pain or severe pain. MAXIMUM TOTAL ACETAMINOPHEN DOSE IS 4000 MG PER DAY 20 tablet 0   No facility-administered medications prior to visit.    No Known Allergies  ROS Review of Systems  Constitutional: Negative.   Eyes: Negative.   Respiratory: Negative.    Cardiovascular: Negative.   Endocrine: Negative.   Skin: Negative.   Neurological: Negative.      Objective:    Physical Exam HENT:     Head: Normocephalic and atraumatic.     Mouth/Throat:     Mouth: Mucous membranes are moist.  Eyes:     Extraocular Movements: Extraocular movements intact.     Conjunctiva/sclera: Conjunctivae normal.     Pupils: Pupils are equal, round, and reactive to light.  Cardiovascular:     Rate and Rhythm: Normal rate and regular rhythm.     Pulses: Normal pulses.     Heart sounds: Normal heart sounds.  Pulmonary:     Effort: Pulmonary effort is normal.     Breath sounds: Normal breath sounds.  Skin:    General: Skin is warm.  Neurological:     General: No focal deficit present.     Mental Status: He is alert and oriented to person, place, and time. Mental status is at baseline.  Psychiatric:         Mood and Affect: Mood normal.        Behavior: Behavior normal.        Thought Content: Thought content normal.        Judgment: Judgment normal.    BP (!) 152/99 (BP Location: Right Arm, Patient Position: Sitting, Cuff Size: Large)    Pulse 84    Temp (!) 97.4 F (36.3 C) (Oral)    Resp 16  Ht '5\' 8"'  (1.727 m)    Wt 229 lb 1.6 oz (103.9 kg)    SpO2 95%    BMI 34.83 kg/m  Wt Readings from Last 3 Encounters:  03/03/21 229 lb 1.6 oz (103.9 kg)  01/27/21 226 lb (102.5 kg)  11/10/20 220 lb (99.8 kg)   Encouraged weight loss  Health Maintenance Due  Topic Date Due   COVID-19 Vaccine (1) Never done   Pneumococcal Vaccine 52-7 Years old (1 - PCV) Never done   FOOT EXAM  Never done   OPHTHALMOLOGY EXAM  Never done   HIV Screening  Never done   Hepatitis C Screening  Never done   Zoster Vaccines- Shingrix (1 of 2) Never done   INFLUENZA VACCINE  10/18/2020    There are no preventive care reminders to display for this patient.  Lab Results  Component Value Date   TSH 9.130 (H) 02/24/2021   Lab Results  Component Value Date   WBC 6.4 01/13/2021   HGB 15.1 01/13/2021   HCT 44.7 01/13/2021   MCV 92 01/13/2021   PLT 213 01/13/2021   Lab Results  Component Value Date   NA 139 11/03/2020   K 3.5 11/03/2020   CO2 25 11/03/2020   GLUCOSE 122 (H) 11/03/2020   BUN 20 11/03/2020   CREATININE 0.91 11/03/2020   BILITOT 0.4 09/15/2020   ALKPHOS 95 09/15/2020   AST 27 09/15/2020   ALT 25 09/15/2020   PROT 7.0 09/15/2020   ALBUMIN 4.5 09/15/2020   CALCIUM 9.0 11/03/2020   ANIONGAP 12 11/03/2020   EGFR 102 09/15/2020   Lab Results  Component Value Date   CHOL 207 (H) 01/13/2021   Lab Results  Component Value Date   HDL 37 (L) 01/13/2021   Lab Results  Component Value Date   LDLCALC 131 (H) 01/13/2021   Lab Results  Component Value Date   TRIG 219 (H) 01/13/2021   Lab Results  Component Value Date   CHOLHDL 5.6 (H) 01/13/2021   Lab Results  Component Value Date    HGBA1C 5.3 01/13/2021      Assessment & Plan:   1. Hypothyroidism, unspecified type - His TSH was elevated, will not change dose, advised to take medication 45 minutes prior to taking his other medications. - levothyroxine (SYNTHROID) 150 MCG tablet; TAKE ONE TABLET BY MOUTH EVERY DAY BEFORE BREAKFAST  Dispense: 90 tablet; Refill: 0 - TSH; Future  2. Benign hypertension - His blood pressure was elevated, his goal should be less than 140/90, they within normal limits per patient's log. He will continue on current medication, DASH diet and exercise as tolerated. - lisinopril (ZESTRIL) 20 MG tablet; TAKE ONE TABLET BY MOUTH ONCE EVERY DAY.  Dispense: 90 tablet; Refill: 1  3. Mixed hyperlipidemia -The 10-year ASCVD risk score (Arnett DK, et al., 2019) is: 38.5%   Values used to calculate the score:     Age: 13 years     Sex: Male     Is Non-Hispanic African American: No     Diabetic: Yes     Tobacco smoker: Yes     Systolic Blood Pressure: 644 mmHg     Is BP treated: Yes     HDL Cholesterol: 37 mg/dL     Total Cholesterol: 207 mg/dL His ASCVD risk is 38.5%, his medication was changed to Atorvastatin 40 mg, was educated on medication side effects and advised to notify clinic. He will continue on low fat/cholesterol diet and exercise as tolerated. -  atorvastatin (LIPITOR) 40 MG tablet; Take 1 tablet (40 mg total) by mouth daily.  Dispense: 90 tablet; Refill: 0  4. Erectile dysfunction of organic origin - He will continue on current medication and complete [facility] financial application for  - Ambulatory referral to Urology     Follow-up: Return in about 13 weeks (around 06/02/2021), or if symptoms worsen or fail to improve.    Takiesha Mcdevitt Jerold Coombe, NP

## 2021-03-04 ENCOUNTER — Other Ambulatory Visit: Payer: Self-pay

## 2021-03-07 ENCOUNTER — Other Ambulatory Visit: Payer: Self-pay

## 2021-03-10 ENCOUNTER — Other Ambulatory Visit: Payer: Self-pay

## 2021-03-10 ENCOUNTER — Other Ambulatory Visit: Payer: Self-pay | Admitting: Surgery

## 2021-03-10 ENCOUNTER — Encounter
Admission: RE | Admit: 2021-03-10 | Discharge: 2021-03-10 | Disposition: A | Discharge status: Home / Self Care | Payer: Self-pay | Source: Ambulatory Visit | Attending: Surgery | Admitting: Surgery

## 2021-03-10 DIAGNOSIS — Z79899 Other long term (current) drug therapy: Secondary | ICD-10-CM

## 2021-03-10 DIAGNOSIS — I10 Essential (primary) hypertension: Secondary | ICD-10-CM

## 2021-03-10 NOTE — Patient Instructions (Signed)
Your procedure is scheduled on:03-23-20 Wednesday Report to the Registration Desk on the 1st floor of the [facility].Then proceed to the 2nd floor Surgery Desk in the [facility] To find out your arrival time, please call [phone] between 1PM - 3PM on:03-22-20 Tuesday  REMEMBER: Instructions that are not followed completely may result in serious medical risk, up to and including death; or upon the discretion of your surgeon and anesthesiologist your surgery may need to be rescheduled.  Do not eat food after midnight the night before surgery.  No gum chewing, lozengers or hard candies.  You may however, drink CLEAR liquids up to 2 hours before you are scheduled to arrive for your surgery. Do not drink anything within 2 hours of your scheduled arrival time.  Clear liquids include: - water  - apple juice without pulp - gatorade (not RED, PURPLE, OR BLUE) - black coffee or tea (Do NOT add milk or creamers to the coffee or tea) Do NOT drink anything that is not on this list.  In addition, your doctor has ordered for you to drink the provided  Ensure Pre-Surgery Clear Carbohydrate Drink  Drinking this carbohydrate drink up to two hours before surgery helps to reduce insulin resistance and improve patient outcomes. Please complete drinking 2 hours prior to scheduled arrival time.  TAKE THESE MEDICATIONS THE MORNING OF SURGERY WITH A SIP OF WATER: -levothyroxine (SYNTHROID)  -omeprazole (PRILOSEC)-take one the night before and one on the morning of surgery - helps to prevent nausea after surgery.)  One week prior to surgery: Stop Anti-inflammatories (NSAIDS) such as Advil, Aleve, Ibuprofen, Motrin, Naproxen, Naprosyn and Aspirin based products such as Excedrin, Goodys Powder, BC Powder.You may however, continue to take Tylenol if needed for pain up until the day of surgery.  Stop ANY OVER THE COUNTER supplements/vitamins 7 days prior to surgery  No Alcohol for 24 hours before or after  surgery.  No Smoking including e-cigarettes for 24 hours prior to surgery.  No chewable tobacco products for at least 6 hours prior to surgery.  No nicotine patches on the day of surgery.  Do not use any "recreational" drugs for at least a week prior to your surgery.  Please be advised that the combination of cocaine and anesthesia may have negative outcomes, up to and including death. If you test positive for cocaine, your surgery will be cancelled.  On the morning of surgery brush your teeth with toothpaste and water, you may rinse your mouth with mouthwash if you wish. Do not swallow any toothpaste or mouthwash.  Use CHG Soap as directed on instruction sheet.  Do not wear jewelry, make-up, hairpins, clips or nail polish.  Do not wear lotions, powders, or perfumes.   Do not shave body from the neck down 48 hours prior to surgery just in case you cut yourself which could leave a site for infection.  Also, freshly shaved skin may become irritated if using the CHG soap.  Contact lenses, hearing aids and dentures may not be worn into surgery.  Do not bring valuables to the hospital. [facility] is not responsible for any missing/lost belongings or valuables.   Notify your doctor if there is any change in your medical condition (cold, fever, infection).  Wear comfortable clothing (specific to your surgery type) to the hospital.  After surgery, you can help prevent lung complications by doing breathing exercises.  Take deep breaths and cough every 1-2 hours. Your doctor may order a device called an Incentive  Spirometer to help you take deep breaths. When coughing or sneezing, hold a pillow firmly against your incision with both hands. This is called splinting. Doing this helps protect your incision. It also decreases belly discomfort.  If you are being admitted to the hospital overnight, leave your suitcase in the car. After surgery it may be brought to your room.  If you are being  discharged the day of surgery, you will not be allowed to drive home. You will need a responsible adult (18 years or older) to drive you home and stay with you that night.   If you are taking public transportation, you will need to have a responsible adult (18 years or older) with you. Please confirm with your physician that it is acceptable to use public transportation.   Please call the [facility] at [phone] if you have any questions about these instructions.  Surgery Visitation Policy:  Patients undergoing a surgery or procedure may have one family member or support person with them as long as that person is not COVID-19 positive or experiencing its symptoms.  That person may remain in the waiting area during the procedure and may rotate out with other people.  Inpatient Visitation:    Visiting hours are 7 a.m. to 8 p.m. Up to two visitors ages 16+ are allowed at one time in a patient room. The visitors may rotate out with other people during the day. Visitors must check out when they leave, or other visitors will not be allowed. One designated support person may remain overnight. The visitor must pass COVID-19 screenings, use hand sanitizer when entering and exiting the patients room and wear a mask at all times, including in the patients room. Patients must also wear a mask when staff or their visitor are in the room. Masking is required regardless of vaccination status.

## 2021-03-16 ENCOUNTER — Encounter
Admission: RE | Admit: 2021-03-16 | Discharge: 2021-03-16 | Disposition: A | Discharge status: Home / Self Care | Payer: PRIVATE HEALTH INSURANCE | Source: Ambulatory Visit | Attending: Surgery | Admitting: Surgery

## 2021-03-16 ENCOUNTER — Other Ambulatory Visit: Payer: Self-pay

## 2021-03-16 DIAGNOSIS — G932 Benign intracranial hypertension: Secondary | ICD-10-CM | POA: Insufficient documentation

## 2021-03-16 DIAGNOSIS — Z79899 Other long term (current) drug therapy: Secondary | ICD-10-CM | POA: Diagnosis not present

## 2021-03-16 DIAGNOSIS — Z01812 Encounter for preprocedural laboratory examination: Secondary | ICD-10-CM | POA: Insufficient documentation

## 2021-03-16 DIAGNOSIS — I10 Essential (primary) hypertension: Secondary | ICD-10-CM

## 2021-03-16 LAB — POTASSIUM: Potassium: 3.5 mmol/L (ref 3.5–5.1)

## 2021-03-23 ENCOUNTER — Other Ambulatory Visit: Payer: Self-pay

## 2021-03-23 ENCOUNTER — Ambulatory Visit: Payer: PRIVATE HEALTH INSURANCE | Admitting: Anesthesiology

## 2021-03-23 ENCOUNTER — Encounter: Payer: Self-pay | Admitting: Surgery

## 2021-03-23 ENCOUNTER — Ambulatory Visit
Admission: RE | Admit: 2021-03-23 | Discharge: 2021-03-23 | Disposition: A | Discharge status: Home / Self Care | Payer: PRIVATE HEALTH INSURANCE | Attending: Surgery | Admitting: Surgery

## 2021-03-23 ENCOUNTER — Encounter
Admission: RE | Disposition: A | Discharge status: Home / Self Care | Payer: Self-pay | Source: Home / Self Care | Attending: Surgery

## 2021-03-23 DIAGNOSIS — E782 Mixed hyperlipidemia: Secondary | ICD-10-CM

## 2021-03-23 DIAGNOSIS — Z79899 Other long term (current) drug therapy: Secondary | ICD-10-CM | POA: Diagnosis not present

## 2021-03-23 DIAGNOSIS — I10 Essential (primary) hypertension: Secondary | ICD-10-CM | POA: Insufficient documentation

## 2021-03-23 DIAGNOSIS — D3612 Benign neoplasm of peripheral nerves and autonomic nervous system, upper limb, including shoulder: Secondary | ICD-10-CM | POA: Insufficient documentation

## 2021-03-23 DIAGNOSIS — Z87891 Personal history of nicotine dependence: Secondary | ICD-10-CM | POA: Diagnosis not present

## 2021-03-23 DIAGNOSIS — K219 Gastro-esophageal reflux disease without esophagitis: Secondary | ICD-10-CM | POA: Diagnosis not present

## 2021-03-23 HISTORY — PX: RESECTION OF HAND NEUROMA: SHX6071

## 2021-03-23 SURGERY — EXCISION, NEUROMA, HAND
Anesthesia: General | Site: Hand | Laterality: Right

## 2021-03-23 MED ORDER — ORAL CARE MOUTH RINSE: 15.0000 mL | Freq: Once | OROMUCOSAL | Status: AC

## 2021-03-23 MED ORDER — DEXAMETHASONE SODIUM PHOSPHATE 10 MG/ML IJ SOLN
INTRAMUSCULAR | Status: DC | PRN
  Administered 2021-03-23: 10 mg via INTRAVENOUS

## 2021-03-23 MED ORDER — ACETAMINOPHEN 10 MG/ML IV SOLN
INTRAVENOUS | Status: DC | PRN
  Administered 2021-03-23: 1000 mg via INTRAVENOUS

## 2021-03-23 MED ORDER — LIDOCAINE HCL (CARDIAC) PF 100 MG/5ML IV SOSY
PREFILLED_SYRINGE | INTRAVENOUS | Status: DC | PRN
  Administered 2021-03-23: 100 mg via INTRAVENOUS

## 2021-03-23 MED ORDER — CEFAZOLIN SODIUM-DEXTROSE 2-4 GM/100ML-% IV SOLN
2.0000 g | INTRAVENOUS | Status: AC
  Administered 2021-03-23: 2 g via INTRAVENOUS

## 2021-03-23 MED ORDER — LACTATED RINGERS IV SOLN: INTRAVENOUS | Status: DC

## 2021-03-23 MED ORDER — DEXMEDETOMIDINE (PRECEDEX) IN NS 20 MCG/5ML (4 MCG/ML) IV SYRINGE
PREFILLED_SYRINGE | INTRAVENOUS | Status: DC | PRN
  Administered 2021-03-23: 4 ug via INTRAVENOUS
  Administered 2021-03-23: 8 ug via INTRAVENOUS

## 2021-03-23 MED ORDER — LISINOPRIL 20 MG PO TABS: 20.0000 mg | ORAL_TABLET | Freq: Every day | ORAL | Status: DC

## 2021-03-23 MED ORDER — IBUPROFEN 800 MG PO TABS: 800.0000 mg | ORAL_TABLET | Freq: Three times a day (TID) | ORAL | 1 refills | Status: AC | PRN

## 2021-03-23 MED ORDER — MIDAZOLAM HCL 2 MG/2ML IJ SOLN
INTRAMUSCULAR | Status: DC | PRN
  Administered 2021-03-23: 2 mg via INTRAVENOUS

## 2021-03-23 MED ORDER — FENTANYL CITRATE (PF) 100 MCG/2ML IJ SOLN
INTRAMUSCULAR | Status: AC
  Filled 2021-03-23: qty 2

## 2021-03-23 MED ORDER — FENTANYL CITRATE (PF) 100 MCG/2ML IJ SOLN
INTRAMUSCULAR | Status: DC | PRN
  Administered 2021-03-23 (×2): 50 ug via INTRAVENOUS

## 2021-03-23 MED ORDER — CHLORHEXIDINE GLUCONATE 0.12 % MT SOLN
OROMUCOSAL | Status: AC
  Administered 2021-03-23: 15 mL via OROMUCOSAL
  Filled 2021-03-23: qty 15

## 2021-03-23 MED ORDER — NEOMYCIN-POLYMYXIN B GU 40-200000 IR SOLN
Status: AC
  Filled 2021-03-23: qty 2

## 2021-03-23 MED ORDER — ATORVASTATIN CALCIUM 40 MG PO TABS: 40.0000 mg | ORAL_TABLET | Freq: Every evening | ORAL | Status: DC

## 2021-03-23 MED ORDER — MIDAZOLAM HCL 2 MG/2ML IJ SOLN
INTRAMUSCULAR | Status: AC
  Filled 2021-03-23: qty 2

## 2021-03-23 MED ORDER — 0.9 % SODIUM CHLORIDE (POUR BTL) OPTIME
TOPICAL | Status: DC | PRN
  Administered 2021-03-23: 500 mL

## 2021-03-23 MED ORDER — HYDROCODONE-ACETAMINOPHEN 5-325 MG PO TABS
1.0000 | ORAL_TABLET | Freq: Four times a day (QID) | ORAL | 0 refills | Status: DC | PRN
Start: 2021-03-23 — End: 2021-04-26

## 2021-03-23 MED ORDER — BUPIVACAINE HCL (PF) 0.5 % IJ SOLN
INTRAMUSCULAR | Status: AC
  Filled 2021-03-23: qty 30

## 2021-03-23 MED ORDER — CHLORHEXIDINE GLUCONATE 0.12 % MT SOLN: 15.0000 mL | Freq: Once | OROMUCOSAL | Status: AC

## 2021-03-23 MED ORDER — PROPOFOL 10 MG/ML IV BOLUS
INTRAVENOUS | Status: DC | PRN
  Administered 2021-03-23: 30 mg via INTRAVENOUS
  Administered 2021-03-23: 50 mg via INTRAVENOUS
  Administered 2021-03-23: 160 mg via INTRAVENOUS

## 2021-03-23 MED ORDER — OMEPRAZOLE 20 MG PO CPDR: 20.0000 mg | DELAYED_RELEASE_CAPSULE | Freq: Every day | ORAL | Status: DC

## 2021-03-23 MED ORDER — CEFAZOLIN SODIUM-DEXTROSE 2-4 GM/100ML-% IV SOLN
INTRAVENOUS | Status: AC
  Filled 2021-03-23: qty 100

## 2021-03-23 MED ORDER — BUPIVACAINE HCL (PF) 0.5 % IJ SOLN
INTRAMUSCULAR | Status: DC | PRN
  Administered 2021-03-23: 6 mL

## 2021-03-23 MED ORDER — ONDANSETRON HCL 4 MG/2ML IJ SOLN
INTRAMUSCULAR | Status: DC | PRN
  Administered 2021-03-23: 4 mg via INTRAVENOUS

## 2021-03-23 MED ORDER — HYDROCHLOROTHIAZIDE 12.5 MG PO CAPS: 12.5000 mg | ORAL_CAPSULE | ORAL | Status: DC

## 2021-03-23 SURGICAL SUPPLY — 36 items
APL PRP STRL LF DISP 70% ISPRP (MISCELLANEOUS) ×1
BNDG CMPR STD VLCR NS LF 5.8X4 (GAUZE/BANDAGES/DRESSINGS) ×1
BNDG ELASTIC 4X5.8 VLCR NS LF (GAUZE/BANDAGES/DRESSINGS) ×2 IMPLANT
BNDG ESMARK 4X12 TAN STRL LF (GAUZE/BANDAGES/DRESSINGS) ×2 IMPLANT
CHLORAPREP W/TINT 26 (MISCELLANEOUS) ×2 IMPLANT
CUFF TOURN SGL QUICK 24 (TOURNIQUET CUFF) ×2
CUFF TRNQT CYL 24X4X16.5-23 (TOURNIQUET CUFF) ×1 IMPLANT
DRAPE U-SHAPE 47X51 STRL (DRAPES) ×2 IMPLANT
ELECT REM PT RETURN 9FT ADLT (ELECTROSURGICAL) ×2
ELECTRODE REM PT RTRN 9FT ADLT (ELECTROSURGICAL) ×1 IMPLANT
GAUZE 4X4 16PLY ~~LOC~~+RFID DBL (SPONGE) ×2 IMPLANT
GAUZE SPONGE 4X4 12PLY STRL (GAUZE/BANDAGES/DRESSINGS) ×2 IMPLANT
GAUZE XEROFORM 1X8 LF (GAUZE/BANDAGES/DRESSINGS) ×2 IMPLANT
GLOVE SRG 8 PF TXTR STRL LF DI (GLOVE) ×1 IMPLANT
GLOVE SURG ENC MOIS LTX SZ8 (GLOVE) ×4 IMPLANT
GLOVE SURG UNDER POLY LF SZ8 (GLOVE) ×2
GOWN STRL REUS W/ TWL LRG LVL3 (GOWN DISPOSABLE) ×2 IMPLANT
GOWN STRL REUS W/TWL LRG LVL3 (GOWN DISPOSABLE) ×4
KIT TURNOVER KIT A (KITS) ×2 IMPLANT
LABEL OR SOLS (LABEL) ×2 IMPLANT
MANIFOLD NEPTUNE II (INSTRUMENTS) ×2 IMPLANT
NDL FILTER BLUNT 18X1 1/2 (NEEDLE) ×1 IMPLANT
NDL HYPO 25X1 1.5 SAFETY (NEEDLE) ×1 IMPLANT
NEEDLE FILTER BLUNT 18X 1/2SAF (NEEDLE) ×1
NEEDLE FILTER BLUNT 18X1 1/2 (NEEDLE) ×1 IMPLANT
NEEDLE HYPO 25X1 1.5 SAFETY (NEEDLE) ×2 IMPLANT
NS IRRIG 500ML POUR BTL (IV SOLUTION) ×2 IMPLANT
PACK EXTREMITY ARMC (MISCELLANEOUS) ×2 IMPLANT
PADDING CAST 3IN STRL (MISCELLANEOUS) ×1
PADDING CAST BLEND 3X4 STRL (MISCELLANEOUS) IMPLANT
PENCIL ELECTRO HAND CTR (MISCELLANEOUS) ×2 IMPLANT
SPLINT CAST 1 STEP 3X12 (MISCELLANEOUS) ×1 IMPLANT
STOCKINETTE IMPERVIOUS 9X36 MD (GAUZE/BANDAGES/DRESSINGS) ×1 IMPLANT
SUT PROLENE 4 0 PS 2 18 (SUTURE) ×1 IMPLANT
SYR 10ML LL (SYRINGE) ×2 IMPLANT
WATER STERILE IRR 500ML POUR (IV SOLUTION) ×2 IMPLANT

## 2021-03-23 NOTE — H&P (Signed)
History of Present Illness: Douglas Bird is a 56 y.o. who presents today for history and physical. He is to undergo excision of neuroma of right hand on 03/23/2021. Last seen in clinic on 02/07/2021. No change in his condition since that time.  Presents for follow-up, now approximately 16 weeks status post excision of a traumatic neuroma on the dorsum of his right hand following a primary repair of the right EIP and index EDC tendons with irrigation, debridement, and primary repair of his right dorsal hand laceration performed 8 months ago. The patient notes that he was doing well until about 6 weeks ago when he reaggravated his hand using a T-handle device to help his brother put some fence posts in. Apparently the vibration from pounding this device over the poles caused such jolt of pain in his hand which took over a week to settle down. He still notes moderate focal tenderness to palpation over the dorsal aspect of his hand in the area of the surgical incision as well as some numbness distal to the laceration site. He has been attending occupational therapy, as well as performing exercises on his own at home with limited benefit. He has not yet returned to work. The patient is quite frustrated by his symptoms and functional limitations, and is ready to consider more aggressive treatment options.  Past Medical History:  Diverticulitis 2011   GERD (gastroesophageal reflux disease)   Hyperlipemia   Hypertension   Hypothyroidism   Thyroid disease   Past Surgical History:  CHOLECYSTECTOMY 08/2011   COLONOSCOPY 04/28/2019 (Dr. Marius Bird)   Douglas Bird [id number] (Dr. Celine Bird)   1. Primary repair of right EIP and index EDC tendons.  2. Irrigation and debridement with primary repair of right dorsal hand laceration (approximately 10 cm in length). Right 04/28/2020 (Dr. Roland Bird)   Wound exploration with excision of traumatic neuroma, dorsum right hand Right 11/10/2020 (Dr. Roland Bird)   [place]   Past Family History:  Stroke Douglas Bird   Gout Douglas Bird   Aneurysm Douglas Bird   Medications:  atorvastatin (LIPITOR) 40 MG tablet Take 40 mg by mouth once daily   diclofenac (VOLTAREN) 1 % topical gel Apply 2 g topically continuously as needed   gabapentin (NEURONTIN) 100 MG capsule Take 1-2 capsules (100-200 mg total) by mouth at bedtime 60 capsule 0   HYDROcodone-acetaminophen (NORCO) 5-325 mg tablet Take 1 tablet by mouth every 6 (six) hours as needed for Pain 15 tablet 0   ibuprofen (MOTRIN) 800 MG tablet Take 1 tablet (800 mg total) by mouth every 6 (six) hours as needed for Pain 30 tablet 0   levothyroxine (SYNTHROID) 150 MCG tablet Take 1 tablet by mouth once daily   lisinopriL (ZESTRIL) 20 MG tablet Take 20 mg by mouth once daily   simvastatin (ZOCOR) 20 MG tablet Take 1 tablet by mouth once daily   hydroCHLOROthiazide (HYDRODIURIL) 25 MG tablet Take 12.5 mg by mouth once daily   sildenafiL (VIAGRA) 100 MG tablet Take 1 tablet by mouth once daily as needed   Allergies: No Known Allergies   Review of Systems: A comprehensive 14 point ROS was performed, reviewed, and the pertinent orthopaedic findings are documented in the HPI.  Physical Exam: BP 136/88 (BP Location: Left upper arm, Patient Position: Sitting, BP Cuff Size: Adult)   Ht 175.3 cm (5\' 9" )   Wt 100.2 kg (221 lb)   BMI 32.64 kg/m   General: Well-developed well-nourished male seen in no acute distress.   HEENT: Atraumatic,normocephalic.  Pupils are equal and reactive to light. Oropharynx is clear with moist mucosa  Lungs: Clear to auscultation bilaterally   Cardiovascular: Regular rate and rhythm. Normal S1, S2. No murmurs. No appreciable gallops or rubs. Peripheral pulses are palpable.  Abdomen: Soft, non-tender, nondistended. Bowel sounds present  Right hand exam: Surgical incision is well-healed and without evidence for infection.  No swelling, erythema, ecchymosis, abrasions, or other skin abnormalities  are identified.  He still experiences moderate focal tenderness to palpation over the dorsal aspect of the hand between the index and long metacarpal heads, but otherwise there is no tenderness to palpation around the hand. He exhibits mildly decreased flexion of the long MCP joint at 75 degrees and of the index MCP joint at 85 degrees, but otherwise exhibits full range of motion of all digits.  He demonstrates 4/5 strength with grip strength testing.  He remains neurovascularly intact to all digits, other than an area of decreased sensation to light touch distal to the laceration site.  Impression: 1.  Status post repair of extensor tendon laceration of right hand. 2.  Traumatic neuroma right hand.  Plan:  The treatment options were discussed with the patient. In addition, patient educational materials were provided regarding the diagnosis and treatment options. The patient is quite frustrated by his persistent symptoms and functional limitations, and is ready to consider more aggressive treatment options. Therefore, I have recommended a surgical procedure, specifically an excision of the recurrent traumatic neuroma with implantation of the nerve and into the interosseous musculature. The procedure was discussed with the patient, as were the potential risks (including bleeding, infection, nerve and/or blood vessel injury, persistent or recurrent pain, residual focal numbness, weakness of grip, stiffness of his fingers, need for further surgery, blood clots, strokes, heart attacks and/or arhythmias, pneumonia, etc.) and benefits. The patient states his/her understanding and wishes to proceed. All of the patient's questions and concerns were answered. He can call any time with further concerns. He will follow up post-surgery, routine.    H&P reviewed and patient re-examined. No changes.

## 2021-03-23 NOTE — Discharge Instructions (Addendum)
Orthopedic discharge instructions: Keep splint dry and intact. Keep hand elevated above heart level. Apply ice to affected area frequently. Take ibuprofen 800 mg TID with meals for 7-10 days, then as necessary. Take pain medication as prescribed or ES Tylenol when needed.  Return for follow-up in 10-14 days or as scheduled.AMBULATORY SURGERY  DISCHARGE INSTRUCTIONS   The drugs that you were given will stay in your system until tomorrow so for the next 24 hours you should not:  Drive an automobile Make any legal decisions Drink any alcoholic beverage   You may resume regular meals tomorrow.  Today it is better to start with liquids and gradually work up to solid foods.  You may eat anything you prefer, but it is better to start with liquids, then soup and crackers, and gradually work up to solid foods.   Please notify your doctor immediately if you have any unusual bleeding, trouble breathing, redness and pain at the surgery site, drainage, fever, or pain not relieved by medication.    Your post-operative visit with Dr.                                       is: Date:                        Time:    Please call to schedule your post-operative visit.  Additional Instructions:

## 2021-03-23 NOTE — Anesthesia Procedure Notes (Signed)
Procedure Name: LMA Insertion Date/Time: 03/23/2021 11:08 AM Performed by: Aline Brochure, CRNA Pre-anesthesia Checklist: Patient identified, Patient being monitored, Timeout performed, Emergency Drugs available and Suction available Patient Re-evaluated:Patient Re-evaluated prior to induction Oxygen Delivery Method: Circle system utilized Preoxygenation: Pre-oxygenation with 100% oxygen Induction Type: IV induction Ventilation: Mask ventilation without difficulty LMA: LMA inserted LMA Size: 4.0 Tube type: Oral Number of attempts: 1 Placement Confirmation: positive ETCO2 and breath sounds checked- equal and bilateral Tube secured with: Tape Dental Injury: Teeth and Oropharynx as per pre-operative assessment

## 2021-03-23 NOTE — Anesthesia Preprocedure Evaluation (Signed)
Anesthesia Evaluation  Patient identified by MRN, date of birth, ID band Patient awake    Reviewed: Allergy & Precautions, H&P , NPO status , Patient's Chart, lab work & pertinent test results, reviewed documented beta blocker date and time   Airway Mallampati: III  TM Distance: >3 FB Neck ROM: full    Dental  (+) Teeth Intact   Pulmonary neg pulmonary ROS, former smoker,    Pulmonary exam normal        Cardiovascular Exercise Tolerance: Good hypertension, On Medications negative cardio ROS Normal cardiovascular exam Rate:Normal     Neuro/Psych  Headaches, negative neurological ROS  negative psych ROS   GI/Hepatic negative GI ROS, Neg liver ROS, GERD  Medicated,  Endo/Other  negative endocrine ROSHypothyroidism   Renal/GU negative Renal ROS  negative genitourinary   Musculoskeletal   Abdominal   Peds  Hematology negative hematology ROS (+)   Anesthesia Other Findings   Reproductive/Obstetrics negative OB ROS                             Anesthesia Physical Anesthesia Plan  ASA: 2  Anesthesia Plan: General LMA   Post-op Pain Management:    Induction:   PONV Risk Score and Plan:   Airway Management Planned:   Additional Equipment:   Intra-op Plan:   Post-operative Plan:   Informed Consent: I have reviewed the patients History and Physical, chart, labs and discussed the procedure including the risks, benefits and alternatives for the proposed anesthesia with the patient or authorized representative who has indicated his/her understanding and acceptance.       Plan Discussed with: CRNA  Anesthesia Plan Comments:         Anesthesia Quick Evaluation

## 2021-03-23 NOTE — Transfer of Care (Signed)
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 (Right: Hand)  Patient Location: PACU  Anesthesia Type:General  Level of Consciousness: sedated  Airway & Oxygen Therapy: Patient Spontanous Breathing and Patient connected to face mask oxygen  Post-op Assessment: Report given to RN and Post -op Vital signs reviewed and stable  Post vital signs: Reviewed and stable  Last Vitals:  Vitals Value Taken Time  BP 124/88 03/23/21 1213  Temp    Pulse 82 03/23/21 1215  Resp 13 03/23/21 1215  SpO2 98 % 03/23/21 1215  Vitals shown include unvalidated device data.  Last Pain:  Vitals:   03/23/21 0851  TempSrc: Tympanic  PainSc: 2          Complications: No notable events documented.

## 2021-03-23 NOTE — Op Note (Signed)
03/23/2021  12:34 PM  Patient:   Douglas Bird  Pre-Op Diagnosis:   Traumatic neuroma status post dorsal right hand laceration with subsequent repair of index EDC and EIP tendons.  Post-Op Diagnosis:   Same  Procedure:   Excision/reimplantation of traumatic neuroma dorsal right hand.  Surgeon:   Pascal Lux, MD  Assistant:   Rocco Pauls, PA-S  Anesthesia:   General LMA  Findings:   As above.  Complications:   None  Fluids:   800 cc crystalloid  EBL:   0 cc  UOP:   None  TT:   34 minutes at 250 mmHg  Drains:   None  Closure:   4-0 Prolene interrupted sutures  Brief Clinical Note:   The patient is a 56 year old male who is now nearly 11 months status post an exploration of a right dorsal hand laceration with repair of the index EDC and EIP tendons.  The patient went on to heal well but continued to complain of focal pain over the dorsal aspect of his hand.  Decision examination were consistent with a traumatic neuroma.  The patient was brought to the operating room 4.5 months ago, at which time he underwent an excision of a presumed traumatic neuroma.  The patient was doing well for about a month or so postoperatively before his symptoms recurred after using a post pounder device to help put up a fence.  His symptoms again are consistent with a recurrent traumatic neuroma.  He presents at this time for resection and reimplantation of the traumatic neuroma involving the dorsal aspect of his right hand.  Procedure:   The patient was brought into the operating room and lain in the supine position.  After adequate general laryngeal mask anesthesia was obtained, the patient's right hand and upper extremity were prepped with ChloraPrep solution before being draped sterilely.  Preoperative antibiotics were administered.  A tourniquet timeout was performed to verify the appropriate surgical site before the limb was exsanguinated with an Esmarch and the tourniquet inflated to 250  mmHg.  Utilizing the proximal end of the previous laceration/incision, the incision was reopened and extended several centimeters.  The incision was carried down into the subcutaneous tissues.  The previously repaired extensor tendons were identified and found to be intact.  Careful dissection demonstrated the presence of two small cutaneous nerves which appeared to and in the area of the laceration/incision.  These were dissected proximally and, after creating a small opening in the fascia overlying the interosseous muscle between the index and long metacarpal, implanted into this area.  The wound was copiously irrigated with sterile saline solution before the wound was closed using 4-0 Prolene interrupted sutures.  A total of 6 cc of 0.5% plain Sensorcaine was injected in and around the incision to help with postoperative analgesia before sterile bulky dressing was applied to the wound.  The patient then placed into a volar splint maintaining the wrist and MCP joints in extension before he was awakened, extubated, and returned to the recovery room in satisfactory condition after tolerating the procedure well.

## 2021-03-30 NOTE — Anesthesia Postprocedure Evaluation (Signed)
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 (Right: Hand)  Patient location during evaluation: PACU Anesthesia Type: General Level of consciousness: awake and alert Pain management: pain level controlled Vital Signs Assessment: post-procedure vital signs reviewed and stable Respiratory status: spontaneous breathing, nonlabored ventilation, respiratory function stable and patient connected to nasal cannula oxygen Cardiovascular status: blood pressure returned to baseline and stable Postop Assessment: no apparent nausea or vomiting Anesthetic complications: no   No notable events documented.   Last Vitals:  Vitals:   03/23/21 1255 03/23/21 1300  BP:  119/88  Pulse: 79   Resp: 16   Temp: (!) 36.2 C   SpO2: 98%     Last Pain:  Vitals:   03/24/21 0845  TempSrc:   PainSc: 0-No pain                 Molli Barrows

## 2021-04-11 ENCOUNTER — Ambulatory Visit: Payer: PRIVATE HEALTH INSURANCE | Attending: Surgery | Admitting: Occupational Therapy

## 2021-04-11 ENCOUNTER — Encounter: Payer: Self-pay | Admitting: Occupational Therapy

## 2021-04-11 ENCOUNTER — Other Ambulatory Visit: Payer: Self-pay

## 2021-04-11 DIAGNOSIS — M79641 Pain in right hand: Secondary | ICD-10-CM | POA: Diagnosis not present

## 2021-04-11 DIAGNOSIS — M6281 Muscle weakness (generalized): Secondary | ICD-10-CM | POA: Insufficient documentation

## 2021-04-11 DIAGNOSIS — M25631 Stiffness of right wrist, not elsewhere classified: Secondary | ICD-10-CM | POA: Diagnosis present

## 2021-04-11 DIAGNOSIS — L905 Scar conditions and fibrosis of skin: Secondary | ICD-10-CM | POA: Diagnosis present

## 2021-04-11 DIAGNOSIS — M25641 Stiffness of right hand, not elsewhere classified: Secondary | ICD-10-CM | POA: Insufficient documentation

## 2021-04-11 NOTE — Therapy (Signed)
[facility] [address], [place], [address] Phone: [phone]   Fax:  [phone]  Occupational Therapy Evaluation  Patient Details  Name: Douglas Bird MRN: [id number] Date of Birth: 11/29/1965 Referring Provider (OT): Dr Roland Rack   Encounter Date: 04/11/2021   OT End of Session - 04/11/21 1602     Visit Number 1    Number of Visits 16    Date for OT Re-Evaluation 06/20/21    OT Start Time 0945    OT Stop Time 9678    OT Time Calculation (min) 50 min    Activity Tolerance Patient tolerated treatment well;Patient limited by pain    Behavior During Therapy [facility] for tasks assessed/performed             Past Medical History:  Diagnosis Date   Diverticulitis 2011   GERD (gastroesophageal reflux disease)    Hyperlipidemia    Hypertension    controlled on meds   Hypothyroidism    Thyroid disease     Past Surgical History:  Procedure Laterality Date   CHOLECYSTECTOMY  June 2013   COLONOSCOPY WITH PROPOFOL N/A 04/28/2019   Procedure: COLONOSCOPY WITH PROPOFOL;  Surgeon: Lin Landsman, MD;  Location: [place];  Service: Endoscopy;  Laterality: N/A;  priority 4   HERNIA REPAIR Bilateral    inguinal   REPAIR EXTENSOR TENDON Right 04/28/2020   Procedure: EXPLORATION OF RIGHT DORSAL HAND LACERATION WITH REPAIR OF INDEX EXTENSOR TENDON LACERATION;  Surgeon: Corky Mull, MD;  Location: [facility];  Service: Orthopedics;  Laterality: Right;   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   Procedure: 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.;  Surgeon: Corky Mull, MD;  Location: [facility];  Service: Orthopedics;  Laterality: Right;   RESECTION OF HAND NEUROMA Right 03/23/2021   Procedure: 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   Subjective Assessment - 04/11/21 1553     Subjective  Febr is going to be year since my injury - this time they kept me in my soft cast until I see you -and to keep it on between exercises until I see the PA next week - I did not take it off - sutures were taken out last week    Pertinent History Pt had saw accident on 04/26/20 at [facility] and then Surgery by Dr Roland Rack on 04/28/20 - Primary repair of right EIP and index EDC tendons.2. Irrigation and debridement with primary repair of right dorsal hand laceration (approximately 10 cm in length). Pt was seen for OT and cont to had nerve pain -11/10/20 had traumatic Neuroma excision -NOW repeat evaluation status post a excision/reimplantation of traumatic neuroma involving the dorsal aspect of the right hand. Surgery was performed by Dr. Roland Rack on 03/23/2021. 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 The patient was doing well until he was hammering in some fence post and suffered a recurrence of his painful neuroma. The patient  presents last week to surgeon off-  reporting no injury or trauma affecting the right hand since his last surgery. He does report numbness and tingling especially in the second and third digits of the right hand. The patient take ibuprofen addition to occasional hydrocodone as needed for discomfort. Pain score today is a 5 out of 10- Refer to OT - pt to keep splint on until appt with OT this date - and to take off only for HEP and keep on untill follow up appt with surgeon 04/18/21    Patient Stated Goals Want to get the use of my R hand back to be able to work and take care of the animals and yard /garden at  home    Currently in Pain? Yes    Pain Score 5     Pain Location Hand   dorsal  hand at 2nd and 3rd MC   Pain Orientation Right   dorsal   Pain Descriptors / Indicators Aching;Tender;Tightness;Burning;Sharp;Shooting    Pain Type Surgical pain    Pain Onset More than a month ago    Pain Frequency Constant               [facility] OT Assessment - 04/11/21 0001       Assessment   Medical Diagnosis R  hand traumatic neuroma excision    Referring Provider (OT) Dr Roland Rack    Onset Date/Surgical Date 03/23/21    Hand Dominance Right    Next MD Visit Jan ,30th    Prior Therapy after tendon repair 2022, after prior neuroma excision end of 2022      Precautions   Required Braces or Orthoses --   volar block splint -soft cast     Home  Environment   Lives With Significant other      Prior Function   Vocation Full time employment    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[employer], farm -Patent examiner, yard , garden , fishing                      OT Treatments/Exercises (OP) - 04/11/21 0001       RUE Contrast Bath   Time 9 minutes    Comments decrease pain and edema prior to AROM            Review with pt HEP 3  x day - splint on inbetween HEP  Keep pain under 2/10  Slight pull  Intrinsic fist and  full extention  Wrist AROM in all planes Could not tolerate tapping of digits, or opposition Hold off  Avoid composite fist - pain over MC's increased         OT Education - 04/11/21 1601     Education Details findings of eval and HEP    Person(s) Educated Patient    Methods Explanation;Demonstration;Tactile cues;Verbal cues;Handout    Comprehension Verbal cues required;Returned demonstration;Verbalized understanding              OT Short Term Goals - 04/11/21 1605       OT SHORT TERM GOAL #1   Title Pt to be independent in HEP to tolerate desentitization of different textures/gentle scar massage and full extention of digits to donn gloves  with  discomfort less than 2/10    Baseline at rest pain - burning 1-5/10 - numbness over dorsal 2nd and 3rd digits and in webspace- cannot toleate touching of dorsal hand over 2nd and 3rd MC's and incision  Time 4    Period Weeks    Status New    Target Date 05/09/21      OT SHORT TERM GOAL #2   Title R wrist AROM increase to WNL without increase symptoms    Baseline pain over dorsal hand -increase pull - increase with wrist flexion with gravity    Time 3    Period Weeks    Status New    Target Date 05/02/21               OT Long Term Goals - 04/11/21 1604       OT LONG TERM GOAL #1   Title Pt R hand digits flexion increase for pt to touch palm to grip and release IADLs objects with pain less than 2-3/10    Baseline in soft volar splint /cast - except with HEP, pain at rest 1-5/10 pain over dorsal hand    Time 5    Period Weeks    Status New    Target Date 05/16/21      OT LONG TERM GOAL #2   Title Grip strength increase to more than 50% compare to L to grip and pick up objects more than 5 lbs without symptoms    Baseline NT - 2 1/2 wkss/p - volar splint    Time 10    Period Weeks    Status New    Target Date 06/20/21      OT LONG TERM GOAL #3   Title Prehension strength increase to more than 50% compare to L to cut food, open packages, use in farm with animals    Baseline 2 1/2 wks s/p - volar splint    Time 8    Period Weeks    Status New    Target Date 06/06/21      OT LONG TERM GOAL #4   Title Function increas for pt to push ,pull door open, push up from chair, hammer nail , shake bags for feeding animals    Baseline 2 1/2 wks s/p    Time 10    Period Weeks    Status New    Target Date 06/20/21                   Plan - 04/11/21 1603     Clinical Impression Statement Pt is about 11 months  s/p right repair of 2nd digit EIP and EDC with irrigation and debridement.  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 Pain 1-5/10 at rest - tenderness increase greatly over dorsal hand and incision- numbness over 2nd and 3rd digits and webspace. Pt ed on using splint inbetween HEP per MD order until 04/18/21 -Pt to initiate today contrast prior to intrinsic fist, with extention of digits, wrist AROM  in all planes - keep pain under 2/10 - avoid composite flexion and extention of digits- pain increase greatly. 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    OT Occupational Profile and History Problem Focused Assessment - Including review of records relating to presenting problem    Occupational performance deficits (Please refer to evaluation for details): ADL's;IADL's;Rest and Sleep;Work;Play;Leisure;Social Participation    Body Structure / Function / Physical Skills ADL;Edema;Dexterity;Decreased knowledge of use of DME;Decreased knowledge of precautions;Flexibility;ROM;UE functional use;Scar mobility;Pain;Strength;IADL;Coordination    Rehab Potential Fair    Clinical Decision Making Limited treatment options, no task modification necessary    Comorbidities Affecting Occupational Performance: None    Modification or Assistance to Complete Evaluation  No modification of tasks or assist necessary to complete eval    OT Frequency --   1-2 x wk   OT Duration --   10 wks   OT Treatment/Interventions  Self-care/ADL training;Contrast Bath;Fluidtherapy;Paraffin;Therapeutic exercise;Ultrasound;Manual Therapy;Passive range of motion;Scar mobilization;Splinting;Patient/family education;Therapeutic activities    Consulted and Agree with Plan of Care Patient             Patient will benefit from skilled therapeutic intervention in order to improve the following deficits and impairments:   Body Structure / Function / Physical Skills: ADL, Edema, Dexterity, Decreased knowledge of use of DME, Decreased knowledge of precautions, Flexibility, ROM, UE functional use, Scar mobility, Pain, Strength, IADL, Coordination       Visit Diagnosis: Pain in right hand - Plan: Ot plan of care cert/re-cert  Scar condition and fibrosis of skin - Plan: Ot plan of care cert/re-cert  Stiffness of right wrist, not elsewhere classified - Plan: Ot plan of care cert/re-cert  Stiffness of right hand, not elsewhere classified - Plan: Ot plan of care cert/re-cert  Muscle weakness (generalized) - Plan: Ot plan of care cert/re-cert    Problem List Patient Active Problem List   Diagnosis Date Noted   Toothache 02/20/2020   Headache 02/20/2020   Smoking 37/90/2409   Umbilical hernia, incarcerated    Back pain 03/27/2019   Benign hypertension 05/02/2018   Obesity (BMI 30.0-34.9) 04/30/2018   Healthcare maintenance 10/19/2016   Flu vaccine need 04/20/2016   GERD (gastroesophageal reflux disease) 04/01/2015   Gout 04/01/2015   Mixed hyperlipidemia 10/01/2014   Hypothyroidism 05/21/2014    Rosalyn Gess, OTR/L,CLT 04/11/2021, 4:24 PM  [facility] [address], [place], [address] Phone: [phone]   Fax:  [phone]  Name: AHMIR BRACKEN MRN: [id number] Date of Birth: Dec 19, 1965

## 2021-04-14 ENCOUNTER — Other Ambulatory Visit: Payer: Self-pay

## 2021-04-14 ENCOUNTER — Ambulatory Visit: Payer: PRIVATE HEALTH INSURANCE | Admitting: Occupational Therapy

## 2021-04-14 DIAGNOSIS — M79641 Pain in right hand: Secondary | ICD-10-CM

## 2021-04-14 DIAGNOSIS — M6281 Muscle weakness (generalized): Secondary | ICD-10-CM

## 2021-04-14 DIAGNOSIS — L905 Scar conditions and fibrosis of skin: Secondary | ICD-10-CM

## 2021-04-14 DIAGNOSIS — M25631 Stiffness of right wrist, not elsewhere classified: Secondary | ICD-10-CM

## 2021-04-14 DIAGNOSIS — M25641 Stiffness of right hand, not elsewhere classified: Secondary | ICD-10-CM

## 2021-04-14 NOTE — Therapy (Signed)
[facility] [address] [place], [address] Phone: [phone]   Fax:  [phone]  Occupational Therapy Treatment  Patient Details  Name: Douglas Bird MRN: [id number] Date of Birth: 1966-01-04 Referring Provider (OT): Dr Roland Rack   Encounter Date: 04/14/2021   OT End of Session - 04/14/21 1448     Visit Number 2    Number of Visits 16    Date for OT Re-Evaluation 06/20/21    OT Start Time 1448    OT Stop Time 1535    OT Time Calculation (min) 47 min    Activity Tolerance Patient tolerated treatment well;Patient limited by pain    Behavior During Therapy [facility] for tasks assessed/performed             Past Medical History:  Diagnosis Date   Diverticulitis 2011   GERD (gastroesophageal reflux disease)    Hyperlipidemia    Hypertension    controlled on meds   Hypothyroidism    Thyroid disease     Past Surgical History:  Procedure Laterality Date   CHOLECYSTECTOMY  June 2013   COLONOSCOPY WITH PROPOFOL N/A 04/28/2019   Procedure: COLONOSCOPY WITH PROPOFOL;  Surgeon: Lin Landsman, MD;  Location: [place];  Service: Endoscopy;  Laterality: N/A;  priority 4   HERNIA REPAIR Bilateral    inguinal   REPAIR EXTENSOR TENDON Right 04/28/2020   Procedure: EXPLORATION OF RIGHT DORSAL HAND LACERATION WITH REPAIR OF INDEX EXTENSOR TENDON LACERATION;  Surgeon: Corky Mull, MD;  Location: [facility];  Service: Orthopedics;  Laterality: Right;   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   Procedure: 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.;  Surgeon: Corky Mull, MD;  Location: [facility];  Service: Orthopedics;  Laterality: Right;   RESECTION OF HAND NEUROMA Right 03/23/2021   Procedure: 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   Subjective Assessment - 04/14/21 1447     Subjective  Did my exercises 3 x day -doing better - pain is about 3-4/10 - swelling better and numbness /sensation getting better I can say - I am seeing the Dr Monday    Pertinent History Pt had saw accident on 04/26/20 at [facility] and then Surgery by Dr Roland Rack on 04/28/20 - Primary repair of right EIP and index EDC tendons.2. Irrigation and debridement with primary repair of right dorsal hand laceration (approximately 10 cm in length). Pt was seen for OT and cont to had nerve pain -11/10/20 had traumatic Neuroma excision -NOW repeat evaluation status post a excision/reimplantation of traumatic neuroma involving the dorsal aspect of the right hand. Surgery was performed by Dr. Roland Rack on 03/23/2021. 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 The patient was doing well until he was hammering in some fence post and suffered a recurrence of his painful neuroma. The patient presents last week to surgeon off-  reporting no injury or trauma affecting the right hand since his last surgery.  He does report numbness and tingling especially in the second and third digits of the right hand. The patient take ibuprofen addition to occasional hydrocodone as needed for discomfort. Pain score today is a 5 out of 10- Refer to OT - pt to keep splint on until appt with OT this date - and to take off only for HEP and keep on untill follow up appt with surgeon 04/18/21    Patient Stated Goals Want to get the use of my R hand back to be able to work and take care of the animals and yard /garden at home    Currently in Pain? Yes    Pain Score 3     Pain Location  Hand    Pain Orientation Right    Pain Descriptors / Indicators Aching;Tender;Tightness;Tingling                [facility] OT Assessment - 04/14/21 0001       AROM   Right Wrist Extension 65 Degrees    Right Wrist Flexion 90 Degrees      Right Hand AROM   R Index  MCP 0-90 60 Degrees    R Index PIP 0-100 70 Degrees    R Long  MCP 0-90 70 Degrees    R Long PIP 0-100 95 Degrees    R Ring PIP 0-100 70 Degrees    R Ring DIP 0-70 95 Degrees    R Little PIP 0-100 80 Degrees    R Little DIP 0-70 95 Degrees             progressing in wrist AROM with pain less than 2/10   Intrinsic fist WNL and thumb AROM WNL  Less pain with composite wrist extention, digits tapping and opposition  Add to HEP          OT Treatments/Exercises (OP) - 04/14/21 0001       RUE Contrast Bath   Time 8 minutes    Comments decrease pain and edema- prior to ROM             Review with pt HEP 4 x day - splint on inbetween HEP per MD order Keep pain under 2/10  Slight pull  Add tendon glides , table , Intrinsic fist   and composite to 2 fingers out of palm and one finger out of palm - with FULL  extention  every  time  10 reps  Wrist AROM in all planes - loose fist and open hand  tapping of digits added this time , and  opposition to all digits 5 reps for opposition  Keep pain under 2/10       OT Education - 04/14/21 1447     Education Details progress and changes to HEP    Person(s) Educated Patient    Methods Explanation;Demonstration;Tactile cues;Verbal cues;Handout    Comprehension Verbal cues required;Returned demonstration;Verbalized understanding              OT Short Term Goals - 04/11/21 1605       OT SHORT TERM GOAL #1   Title Pt to be independent in HEP to tolerate desentitization of different textures/gentle scar massage and full extention of digits to donn gloves  with discomfort less than 2/10    Baseline at rest pain - burning 1-5/10 - numbness over dorsal  2nd and 3rd digits and in webspace- cannot toleate touching of dorsal hand over 2nd and 3rd MC's and incision    Time 4  Period Weeks    Status New    Target Date 05/09/21      OT SHORT TERM GOAL #2   Title R wrist AROM increase to WNL without increase symptoms    Baseline pain over dorsal hand -increase pull - increase with wrist flexion with gravity    Time 3    Period Weeks    Status New    Target Date 05/02/21               OT Long Term Goals - 04/11/21 1604       OT LONG TERM GOAL #1   Title Pt R hand digits flexion increase for pt to touch palm to grip and release IADLs objects with pain less than 2-3/10    Baseline in soft volar splint /cast - except with HEP, pain at rest 1-5/10 pain over dorsal hand    Time 5    Period Weeks    Status New    Target Date 05/16/21      OT LONG TERM GOAL #2   Title Grip strength increase to more than 50% compare to L to grip and pick up objects more than 5 lbs without symptoms    Baseline NT - 2 1/2 wkss/p - volar splint    Time 10    Period Weeks    Status New    Target Date 06/20/21      OT LONG TERM GOAL #3   Title Prehension strength increase to more than 50% compare to L to cut food, open packages, use in farm with animals    Baseline 2 1/2 wks s/p - volar splint    Time 8    Period Weeks    Status New    Target Date 06/06/21      OT LONG TERM GOAL #4   Title Function increas for pt to push ,pull door open, push up from chair, hammer nail , shake bags for feeding animals    Baseline 2 1/2 wks s/p    Time 10    Period Weeks    Status New    Target Date 06/20/21                   Plan - 04/14/21 1448     Clinical Impression Statement Pt is about 11 months  s/p right repair of 2nd digit EIP and EDC with irrigation and debridement.  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 Pt compare to 3 days ago - decrease pain to 2-4 /10 during HEP - and show increase wrist and digits AROM -  report numbness improving - REINFORCE AGAIN WITH pt - GOAL to increase AROM  without increasing pain - pain should decrease while AROM in digits and wrist increase . Pt ed on using splint inbetween HEP per MD order until 04/18/21 -Pt to cont with contrast/or heat prior to tendon glides with extention inbetwen, wrist and thumb AROM  in all planes - keep pain under 2/10.  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    OT Occupational Profile and History Problem Focused Assessment - Including review of records relating to presenting problem    Occupational performance deficits (Please refer to evaluation for details): ADL's;IADL's;Rest and Sleep;Work;Play;Leisure;Social Participation    Body Structure / Function / Physical Skills ADL;Edema;Dexterity;Decreased knowledge of use of DME;Decreased knowledge of precautions;Flexibility;ROM;UE functional use;Scar mobility;Pain;Strength;IADL;Coordination    Rehab Potential Fair    Clinical Decision Making Limited treatment options, no task modification necessary    Comorbidities Affecting Occupational Performance: None    Modification or Assistance to Complete Evaluation  No modification of tasks or assist necessary to complete eval    OT Frequency 2x / week    OT Duration --   10 wks   OT Treatment/Interventions Self-care/ADL  training;Contrast Bath;Fluidtherapy;Paraffin;Therapeutic exercise;Ultrasound;Manual Therapy;Passive range of motion;Scar mobilization;Splinting;Patient/family education;Therapeutic activities    Consulted and Agree with Plan of Care Patient             Patient will benefit from skilled therapeutic intervention in order to improve the following deficits and impairments:   Body Structure / Function / Physical Skills: ADL, Edema, Dexterity, Decreased knowledge of use of DME, Decreased knowledge of precautions, Flexibility, ROM, UE functional use, Scar mobility, Pain, Strength, IADL, Coordination       Visit Diagnosis: Pain in right hand  Scar condition and fibrosis of skin  Stiffness of right wrist, not elsewhere classified  Stiffness of right hand, not elsewhere classified  Muscle weakness (generalized)    Problem List Patient Active Problem List   Diagnosis Date Noted   Toothache 02/20/2020   Headache 02/20/2020   Smoking 62/95/2841   Umbilical hernia, incarcerated    Back pain 03/27/2019   Benign hypertension 05/02/2018   Obesity (BMI 30.0-34.9) 04/30/2018   Healthcare maintenance 10/19/2016   Flu vaccine need 04/20/2016   GERD (gastroesophageal reflux disease) 04/01/2015   Gout 04/01/2015   Mixed hyperlipidemia 10/01/2014   Hypothyroidism 05/21/2014    Rosalyn Gess, OTR/L,CLT 04/14/2021, 4:50 PM  [place] [facility] [address], [place], [address] Phone: [phone]   Fax:  [phone]  Name: MONTARIO ZILKA MRN: [id number] Date of Birth: Sep 29, 1965

## 2021-04-18 ENCOUNTER — Other Ambulatory Visit: Payer: Self-pay

## 2021-04-18 ENCOUNTER — Ambulatory Visit: Payer: PRIVATE HEALTH INSURANCE | Admitting: Occupational Therapy

## 2021-04-18 DIAGNOSIS — M25631 Stiffness of right wrist, not elsewhere classified: Secondary | ICD-10-CM

## 2021-04-18 DIAGNOSIS — M6281 Muscle weakness (generalized): Secondary | ICD-10-CM

## 2021-04-18 DIAGNOSIS — M79641 Pain in right hand: Secondary | ICD-10-CM

## 2021-04-18 DIAGNOSIS — L905 Scar conditions and fibrosis of skin: Secondary | ICD-10-CM

## 2021-04-18 DIAGNOSIS — M25641 Stiffness of right hand, not elsewhere classified: Secondary | ICD-10-CM

## 2021-04-18 NOTE — Therapy (Signed)
[facility] [address], [place], [address] Phone: [phone]   Fax:  [phone]  Occupational Therapy Treatment  Patient Details  Name: Douglas Bird MRN: [id number] Date of Birth: 1965-08-13 Referring Provider (OT): Dr Roland Rack   Encounter Date: 04/18/2021   OT End of Session - 04/18/21 1329     Visit Number 3    Number of Visits 16    Date for OT Re-Evaluation 06/20/21    OT Start Time 1200    OT Stop Time 1240    OT Time Calculation (min) 40 min    Activity Tolerance Patient tolerated treatment well    Behavior During Therapy [facility] for tasks assessed/performed             Past Medical History:  Diagnosis Date   Diverticulitis 2011   GERD (gastroesophageal reflux disease)    Hyperlipidemia    Hypertension    controlled on meds   Hypothyroidism    Thyroid disease     Past Surgical History:  Procedure Laterality Date   CHOLECYSTECTOMY  June 2013   COLONOSCOPY WITH PROPOFOL N/A 04/28/2019   Procedure: COLONOSCOPY WITH PROPOFOL;  Surgeon: Lin Landsman, MD;  Location: [place];  Service: Endoscopy;  Laterality: N/A;  priority 4   HERNIA REPAIR Bilateral    inguinal   REPAIR EXTENSOR TENDON Right 04/28/2020   Procedure: EXPLORATION OF RIGHT DORSAL HAND LACERATION WITH REPAIR OF INDEX EXTENSOR TENDON LACERATION;  Surgeon: Corky Mull, MD;  Location: [facility];  Service: Orthopedics;  Laterality: Right;   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   Procedure: 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.;  Surgeon: Corky Mull, MD;  Location: [facility];  Service: Orthopedics;  Laterality: Right;   RESECTION OF HAND NEUROMA Right 03/23/2021   Procedure: 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   Subjective Assessment - 04/18/21 1311     Subjective  Did my exercises 4 x and kept the splint on -did not get my hand  wet yet like the Dr said- feel pull when doing the wrist down and up and making fist - I stop when  I feel it    Pertinent History Pt had saw accident on 04/26/20 at [facility] and then Surgery by Dr Roland Rack on 04/28/20 - Primary repair of right EIP and index EDC tendons.2. Irrigation and debridement with primary repair of right dorsal hand laceration (approximately 10 cm in length). Pt was seen for OT and cont to had nerve pain -11/10/20 had traumatic Neuroma excision -NOW repeat evaluation status post a excision/reimplantation of traumatic neuroma involving the dorsal aspect of the right hand. Surgery was performed by Dr. Roland Rack on 03/23/2021. 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 The patient was doing well until he was hammering in some fence post and suffered a recurrence of his painful neuroma. The patient presents last week to surgeon off-  reporting no injury or trauma  affecting the right hand since his last surgery. He does report numbness and tingling especially in the second and third digits of the right hand. The patient take ibuprofen addition to occasional hydrocodone as needed for discomfort. Pain score today is a 5 out of 10- Refer to OT - pt to keep splint on until appt with OT this date - and to take off only for HEP and keep on untill follow up appt with surgeon 04/18/21    Patient Stated Goals Want to get the use of my R hand back to be able to work and take care of the animals and yard /garden at home    Currently in Pain? Yes    Pain Score 3     Pain Location  Wrist    Pain Orientation Right    Pain Descriptors / Indicators Tightness;Sore    Pain Type Surgical pain    Pain Onset More than a month ago    Pain Frequency Intermittent                [facility] OT Assessment - 04/18/21 0001       AROM   Right Wrist Extension 60 Degrees    Right Wrist Flexion 85 Degrees      Right Hand AROM   R Thumb Opposition to Index --   Opposition to side of 5th   R Index  MCP 0-90 70 Degrees    R Index PIP 0-100 90 Degrees    R Long  MCP 0-90 75 Degrees    R Long PIP 0-100 95 Degrees    R Ring PIP 0-100 80 Degrees    R Ring DIP 0-70 100 Degrees    R Little PIP 0-100 80 Degrees    R Little DIP 0-70 80 Degrees              progressing in wrist AROM with pain less than 2/10   Intrinsic fist WNL and thumb AROM WNL  Less pain with composite wrist extention, digits tapping and opposition  Most of pull over dorsal hand and 2nd/3rd digit during composite fist                                 OT Treatments/Exercises (OP) - 04/18/21 0001       RUE Fluidotherapy   Number Minutes Fluidotherapy 8 Minutes    RUE Fluidotherapy Location Hand;Wrist    Comments AROM for wrist and digits- decrease stiffness            LESS pain and stiffness after fluido  Review with pt HEP 4 x day - splint on as per surgeon order  Keep pain under 2/10  Slight pull  Dong very well with intrinsic fist and MC with PIP extention  But slight pull with composite - pt to work toward gripping around narrow cylinder - like  tools ED pt that looking at L hand - pt never had 90 MC flexion at 2nd and 3rd MC - thumb thenar taking space up  Cont tendon glides , table , Intrinsic fist   and composite to palm with thumb ADD   10 reps  Wrist AROM in all planes - loose fist and open hand- can have loose grip around screw driver handle and do wrist flexion, ext and RD, UD  Pain less than 2/10   tapping of digits and  opposition to all digits 5 reps for opposition  Keep pain under 2/10  Making progress- loose fist , light grip        OT Education - 04/18/21 1322     Education Details progress and changes to HEP    Person(s) Educated Patient    Methods Explanation;Demonstration;Tactile cues;Verbal cues;Handout    Comprehension Verbal cues required;Returned demonstration;Verbalized understanding              OT Short Term Goals - 04/11/21 1605       OT SHORT TERM GOAL #1   Title Pt to be independent in HEP to tolerate desentitization of different textures/gentle scar massage and full extention of digits to donn gloves  with discomfort less than 2/10    Baseline at rest pain - burning 1-5/10 - numbness over dorsal 2nd and 3rd digits and in webspace- cannot toleate touching of dorsal hand over 2nd and 3rd MC's and incision    Time 4    Period Weeks    Status New    Target Date 05/09/21      OT SHORT TERM GOAL #2   Title R wrist AROM increase to WNL without increase symptoms    Baseline pain over dorsal hand -increase pull - increase with wrist flexion with gravity    Time 3    Period Weeks    Status New    Target Date 05/02/21               OT Long Term Goals - 04/11/21 1604       OT LONG TERM GOAL #1   Title Pt R hand digits flexion increase for pt to touch palm to grip and release IADLs objects with pain less than 2-3/10    Baseline in soft volar splint /cast - except with HEP, pain at rest 1-5/10 pain over dorsal hand    Time 5    Period Weeks    Status New    Target Date 05/16/21      OT LONG TERM GOAL #2   Title Grip strength increase to more than 50% compare to L to grip and pick up objects more than 5 lbs without symptoms    Baseline NT - 2 1/2 wkss/p - volar splint    Time 10    Period Weeks    Status New    Target Date 06/20/21      OT LONG TERM GOAL #3   Title Prehension strength increase to more than 50% compare to L to cut food, open packages, use in farm with animals    Baseline 2 1/2 wks s/p - volar  splint    Time 8    Period Weeks    Status New    Target Date 06/06/21      OT LONG TERM GOAL #4   Title Function increas for pt to push ,pull door open, push up from chair, hammer nail , shake bags for feeding animals    Baseline 2 1/2 wks s/p    Time 10    Period Weeks    Status New    Target Date 06/20/21                   Plan - 04/18/21 1329     Clinical Impression Statement Pt is about 11 months  s/p right repair of 2nd digit EIP and EDC with irrigation and debridement.  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 Pt compare week ago decrease pain to 2-4 /10 during HEP - and show increase wrist and digits AROM- focus on composite fist now but no forcefull grip -  report numbness over dorsal hand and 2nd/3rd digits- REINFORCE AGAIN WITH pt - GOAL to increase AROM/use and strength  without increasing pain - pain should decrease while AROM in digits and wrist increase . Pt ed on using splint inbetween HEP per MD order until 04/18/21 -Pt to cont with contrast/or heat prior to tendon glides with extention inbetwen, wrist and thumb AROM  in all planes - keep pain under 2/10.  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    OT Occupational Profile and History Problem Focused Assessment - Including review of records relating to presenting problem    Occupational  performance deficits (Please refer to evaluation for details): ADL's;IADL's;Rest and Sleep;Work;Play;Leisure;Social Participation    Body Structure / Function / Physical Skills ADL;Edema;Dexterity;Decreased knowledge of use of DME;Decreased knowledge of precautions;Flexibility;ROM;UE functional use;Scar mobility;Pain;Strength;IADL;Coordination    Rehab Potential Fair    Clinical Decision Making Limited treatment options, no task modification necessary    Comorbidities Affecting Occupational Performance: None    Modification or Assistance to Complete Evaluation  No modification of tasks or assist necessary to complete eval    OT Frequency 2x / week    OT Duration 8 weeks    OT Treatment/Interventions Self-care/ADL training;Contrast Bath;Fluidtherapy;Paraffin;Therapeutic exercise;Ultrasound;Manual Therapy;Passive range of motion;Scar mobilization;Splinting;Patient/family education;Therapeutic activities    Consulted and Agree with Plan of Care Patient             Patient will benefit from skilled therapeutic intervention in order to improve the following deficits and impairments:   Body Structure / Function / Physical Skills: ADL, Edema, Dexterity, Decreased knowledge of use of DME, Decreased knowledge of precautions, Flexibility, ROM, UE functional use, Scar mobility, Pain, Strength, IADL, Coordination       Visit Diagnosis: Pain in right hand  Stiffness of right wrist, not elsewhere classified  Scar condition and fibrosis of skin  Stiffness of right hand, not elsewhere classified  Muscle weakness (generalized)    Problem List Patient Active Problem List   Diagnosis Date Noted   Toothache 02/20/2020   Headache 02/20/2020   Smoking 78/29/5621   Umbilical hernia, incarcerated    Back pain 03/27/2019   Benign hypertension 05/02/2018   Obesity (BMI 30.0-34.9) 04/30/2018   Healthcare maintenance 10/19/2016   Flu vaccine need 04/20/2016   GERD (gastroesophageal reflux  disease) 04/01/2015   Gout 04/01/2015   Mixed hyperlipidemia 10/01/2014   Hypothyroidism 05/21/2014    Rosalyn Gess, OTR/L,CLT 04/18/2021, 1:34 PM  [facility] [address], [place], [address] Phone: [phone]   Fax:  [phone]  Name: Douglas Bird MRN: [id number] Date of Birth: Apr 22, 1965

## 2021-04-19 ENCOUNTER — Ambulatory Visit: Payer: Self-pay | Admitting: Urology

## 2021-04-19 ENCOUNTER — Other Ambulatory Visit: Payer: Self-pay | Admitting: Gerontology

## 2021-04-19 ENCOUNTER — Other Ambulatory Visit: Payer: Self-pay

## 2021-04-19 DIAGNOSIS — I10 Essential (primary) hypertension: Secondary | ICD-10-CM

## 2021-04-19 MED FILL — Hydrochlorothiazide Cap 12.5 MG: ORAL | 90 days supply | Qty: 90 | Fill #0 | Status: AC

## 2021-04-21 ENCOUNTER — Other Ambulatory Visit: Payer: Self-pay

## 2021-04-21 ENCOUNTER — Ambulatory Visit: Payer: PRIVATE HEALTH INSURANCE | Attending: Student | Admitting: Occupational Therapy

## 2021-04-21 DIAGNOSIS — M25641 Stiffness of right hand, not elsewhere classified: Secondary | ICD-10-CM | POA: Diagnosis present

## 2021-04-21 DIAGNOSIS — L905 Scar conditions and fibrosis of skin: Secondary | ICD-10-CM | POA: Insufficient documentation

## 2021-04-21 DIAGNOSIS — M79641 Pain in right hand: Secondary | ICD-10-CM | POA: Diagnosis present

## 2021-04-21 DIAGNOSIS — M6281 Muscle weakness (generalized): Secondary | ICD-10-CM | POA: Insufficient documentation

## 2021-04-21 DIAGNOSIS — M25631 Stiffness of right wrist, not elsewhere classified: Secondary | ICD-10-CM | POA: Insufficient documentation

## 2021-04-21 NOTE — Therapy (Signed)
[facility] [address], [place], [address] Phone: [phone]   Fax:  [phone]  Occupational Therapy Treatment  Patient Details  Name: Douglas Bird MRN: [id number] Date of Birth: [birth year] Referring Provider (OT): Dr Roland Rack   Encounter Date: 04/21/2021   OT End of Session - 04/21/21 1116     Visit Number 4    Number of Visits 16    Date for OT Re-Evaluation 06/20/21    OT Start Time 1115    OT Stop Time 1154    OT Time Calculation (min) 39 min    Activity Tolerance Patient tolerated treatment well    Behavior During Therapy [facility] for tasks assessed/performed             Past Medical History:  Diagnosis Date   Diverticulitis 2011   GERD (gastroesophageal reflux disease)    Hyperlipidemia    Hypertension    controlled on meds   Hypothyroidism    Thyroid disease     Past Surgical History:  Procedure Laterality Date   CHOLECYSTECTOMY  June 2013   COLONOSCOPY WITH PROPOFOL N/A 04/28/2019   Procedure: COLONOSCOPY WITH PROPOFOL;  Surgeon: Lin Landsman, MD;  Location: [facility];  Service: Endoscopy;  Laterality: N/A;  priority 4   HERNIA REPAIR Bilateral    inguinal   REPAIR EXTENSOR TENDON Right 04/28/2020   Procedure: EXPLORATION OF RIGHT DORSAL HAND LACERATION WITH REPAIR OF INDEX EXTENSOR TENDON LACERATION;  Surgeon: Corky Mull, MD;  Location: [facility];  Service: Orthopedics;  Laterality: Right;   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   Procedure: 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.;  Surgeon: Corky Mull, MD;  Location: [facility];  Service: Orthopedics;  Laterality: Right;   RESECTION OF HAND NEUROMA Right 03/23/2021   Procedure: 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   Subjective Assessment - 04/21/21 1115     Subjective  Seen the Dr - took my splint off and soaking in warm water now - still pull when making tight fist and bending down    the wrist -but they told me to take it slow this time- do running , pulling ,pushing or heavy stuff    Pertinent History Pt had saw accident on 04/26/20 at [facility] and then Surgery by Dr Roland Rack on 04/28/20 - Primary repair of right EIP and index EDC tendons.2. Irrigation and debridement with primary repair of right dorsal hand laceration (approximately 10 cm in length). Pt was seen for OT and cont to had nerve pain -11/10/20 had traumatic Neuroma excision -NOW repeat evaluation status post a excision/reimplantation of traumatic neuroma involving the dorsal aspect of the right hand. Surgery was performed by Dr. Roland Rack on 03/23/2021. 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 The patient was doing well until he was hammering in some fence post and suffered a recurrence of his painful neuroma. The patient presents last week to surgeon off-  reporting no injury or trauma affecting the right hand since his last surgery. He does report numbness and tingling especially in the second and third digits of the right hand. The patient take ibuprofen addition to occasional hydrocodone as needed for discomfort. Pain score today is a 5 out of 10- Refer to OT - pt to keep splint on until appt with OT this date - and to take off only for HEP and keep on untill follow up appt with surgeon 04/18/21    Patient Stated Goals Want to get the use of my R hand back to be able to work and take care of the animals and yard /garden at home    Currently in Pain?  Yes    Pain Score 2     Pain Location Hand    Pain Orientation Right    Pain Descriptors / Indicators Tightness;Sore    Pain Type Surgical pain    Pain Onset More than a month ago    Pain Frequency Intermittent                [facility] OT Assessment - 04/21/21 0001       AROM   Right Wrist Extension 70 Degrees    Right Wrist Flexion 95 Degrees      Right Hand AROM   R Index  MCP 0-90 70 Degrees    R Index PIP 0-100 90 Degrees    R Long  MCP 0-90 65 Degrees    R Long PIP 0-100 90 Degrees    R Ring PIP 0-100 70 Degrees    R Ring DIP 0-70 100 Degrees    R Little PIP 0-100 70 Degrees    R Little DIP 0-70 90 Degrees              Resting pain coming in 2/10 -less swelling  Fitted pt with isontoner glove  And soft massage and different soft textures - pt to do open hand and close loose fist         OT Treatments/Exercises (OP) - 04/21/21 0001       RUE Paraffin   Number Minutes Paraffin 8 Minutes    RUE Paraffin Location Hand   wrist   Comments in 2 min , fist , then open hand , fist -to increase flexion of MC's /compositie                LESS pain and stiffness after paraffin Review with pt HEP 3 x day   Keep pain under 2/10  Slight pull  Change MC flexion with PIP extention - light stretch  Dong very well with intrinsic fist  But slight pull with composite - pt to do to red foam roller , 2 fingers and then 1 fingers  Keep pain under 2/10  ED pt that looking at L hand - pt never had 90 MC flexion at 2nd and 3rd MC - thumb thenar taking space up   10 reps  Wrist AROM in all planes -  open hand then loose fist ( great progress after paraffin with this one) screw driver handle hold and do AROM  RD, UD  Pain less than 2/10   tapping of digits and  opposition to all digits 5 reps for opposition  Prayer stretch - 10 reps - slight pul Radial  N glide- was able to do 5 reps - all 3-4 steps  with pull less than 2/10  Keep pain under 2/10  OT  Education - 04/21/21 1116     Education Details progress and changes to HEP    Person(s) Educated Patient    Methods Explanation;Demonstration;Tactile cues;Verbal cues;Handout    Comprehension Verbal cues required;Returned demonstration;Verbalized understanding              OT Short Term Goals - 04/11/21 1605       OT SHORT TERM GOAL #1   Title Pt to be independent in HEP to tolerate desentitization of different textures/gentle scar massage and full extention of digits to donn gloves  with discomfort less than 2/10    Baseline at rest pain - burning 1-5/10 - numbness over dorsal 2nd and 3rd digits and in webspace- cannot toleate touching of dorsal hand over 2nd and 3rd MC's and incision    Time 4    Period Weeks    Status New    Target Date 05/09/21      OT SHORT TERM GOAL #2   Title R wrist AROM increase to WNL without increase symptoms    Baseline pain over dorsal hand -increase pull - increase with wrist flexion with gravity    Time 3    Period Weeks    Status New    Target Date 05/02/21               OT Long Term Goals - 04/11/21 1604       OT LONG TERM GOAL #1   Title Pt R hand digits flexion increase for pt to touch palm to grip and release IADLs objects with pain less than 2-3/10    Baseline in soft volar splint /cast - except with HEP, pain at rest 1-5/10 pain over dorsal hand    Time 5    Period Weeks    Status New    Target Date 05/16/21      OT LONG TERM GOAL #2   Title Grip strength increase to more than 50% compare to L to grip and pick up objects more than 5 lbs without symptoms    Baseline NT - 2 1/2 wkss/p - volar splint    Time 10    Period Weeks    Status New    Target Date 06/20/21      OT LONG TERM GOAL #3   Title Prehension strength increase to more than 50% compare to L to cut food, open packages, use in farm with animals    Baseline 2 1/2 wks s/p - volar splint    Time 8    Period Weeks    Status New    Target Date 06/06/21       OT LONG TERM GOAL #4   Title Function increas for pt to push ,pull door open, push up from chair, hammer nail , shake bags for feeding animals    Baseline 2 1/2 wks s/p    Time 10    Period Weeks    Status New    Target Date 06/20/21                   Plan - 04/21/21 1116     Clinical Impression Statement Pt is about 11 months  s/p right repair of 2nd digit EIP and EDC with irrigation and debridement.  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 Compare to last week pt resting pain decrease to 2/10 and some of his AROM it stays about 2/10 but increase with composite fist and wrist flexion. Cont to have numbness over dorsal hand and 2nd/3rd digits-    REINFORCE AGAIN WITH pt - GOAL to increase AROM/use and strength  without increasing pain - pain should decrease while AROM in digits and wrist increase . This date use paraffin moist deep heat to increae flexion -  composite fist still increase pain -but add light stretch to MC flexion - composite wrist flexion improved greatly in session. Pt to cont with moist heat prior to AROM and AAROM with extention inbetwen, wrist and thumb AROM  in all planes - keep pain under 2/10.  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    OT Occupational Profile and History Problem Focused Assessment -  Including review of records relating to presenting problem    Occupational performance deficits (Please refer to evaluation for details): ADL's;IADL's;Rest and Sleep;Work;Play;Leisure;Social Participation    Body Structure / Function / Physical Skills ADL;Edema;Dexterity;Decreased knowledge of use of DME;Decreased knowledge of precautions;Flexibility;ROM;UE functional use;Scar mobility;Pain;Strength;IADL;Coordination    Rehab Potential Fair    Clinical Decision Making Limited treatment options, no task modification necessary    Comorbidities Affecting Occupational Performance: None    Modification or Assistance to Complete Evaluation  No modification of tasks or assist necessary to complete eval    OT Frequency 2x / week    OT Duration 8 weeks    OT Treatment/Interventions Self-care/ADL training;Contrast Bath;Fluidtherapy;Paraffin;Therapeutic exercise;Ultrasound;Manual Therapy;Passive range of motion;Scar mobilization;Splinting;Patient/family education;Therapeutic activities    Consulted and Agree with Plan of Care Patient             Patient will benefit from skilled therapeutic intervention in order to improve the following deficits and impairments:   Body Structure / Function / Physical Skills: ADL, Edema, Dexterity, Decreased knowledge of use of DME, Decreased knowledge of precautions, Flexibility, ROM, UE functional use, Scar mobility, Pain, Strength, IADL, Coordination       Visit Diagnosis: Pain in right hand  Stiffness of right wrist, not elsewhere classified  Scar condition and fibrosis of skin  Stiffness of right hand, not elsewhere classified  Muscle weakness (generalized)    Problem List Patient Active Problem List   Diagnosis Date Noted   Toothache 02/20/2020   Headache 02/20/2020   Smoking 37/62/8315   Umbilical hernia, incarcerated    Back pain 03/27/2019   Benign hypertension 05/02/2018   Obesity (BMI 30.0-34.9) 04/30/2018   Healthcare maintenance  10/19/2016   Flu vaccine need 04/20/2016   GERD (gastroesophageal reflux disease) 04/01/2015   Gout 04/01/2015   Mixed hyperlipidemia 10/01/2014   Hypothyroidism 05/21/2014    Rosalyn Gess, OTR/L,CLT 04/21/2021, 12:00 PM  [facility] [address] [place], [address] Phone: [phone]   Fax:  [phone]  Name: Douglas Bird MRN: [id number] Date of Birth: [birth year]

## 2021-04-22 DIAGNOSIS — L905 Scar conditions and fibrosis of skin: Secondary | ICD-10-CM | POA: Diagnosis present

## 2021-04-22 DIAGNOSIS — M6281 Muscle weakness (generalized): Secondary | ICD-10-CM | POA: Diagnosis present

## 2021-04-22 DIAGNOSIS — M79641 Pain in right hand: Secondary | ICD-10-CM | POA: Diagnosis present

## 2021-04-22 DIAGNOSIS — M25641 Stiffness of right hand, not elsewhere classified: Secondary | ICD-10-CM | POA: Diagnosis present

## 2021-04-22 DIAGNOSIS — M25631 Stiffness of right wrist, not elsewhere classified: Secondary | ICD-10-CM | POA: Diagnosis present

## 2021-04-25 ENCOUNTER — Ambulatory Visit: Payer: PRIVATE HEALTH INSURANCE | Admitting: Occupational Therapy

## 2021-04-25 ENCOUNTER — Other Ambulatory Visit: Payer: Self-pay

## 2021-04-25 DIAGNOSIS — M79641 Pain in right hand: Secondary | ICD-10-CM

## 2021-04-25 DIAGNOSIS — M25641 Stiffness of right hand, not elsewhere classified: Secondary | ICD-10-CM

## 2021-04-25 DIAGNOSIS — L905 Scar conditions and fibrosis of skin: Secondary | ICD-10-CM

## 2021-04-25 DIAGNOSIS — M6281 Muscle weakness (generalized): Secondary | ICD-10-CM

## 2021-04-25 DIAGNOSIS — M25631 Stiffness of right wrist, not elsewhere classified: Secondary | ICD-10-CM

## 2021-04-25 NOTE — Therapy (Signed)
[facility] [address], [place], [address] Phone: [phone]   Fax:  [phone]  Occupational Therapy Treatment  Patient Details  Name: Douglas Bird MRN: [id number] Date of Birth: 11-07-1965 Referring Provider (OT): Dr Roland Rack   Encounter Date: 04/25/2021   OT End of Session - 04/25/21 1031     Visit Number 5    Number of Visits 16    Date for OT Re-Evaluation 06/20/21    OT Start Time 1031    OT Stop Time 1115    OT Time Calculation (min) 44 min    Activity Tolerance Patient tolerated treatment well    Behavior During Therapy [facility] for tasks assessed/performed             Past Medical History:  Diagnosis Date   Diverticulitis 2011   GERD (gastroesophageal reflux disease)    Hyperlipidemia    Hypertension    controlled on meds   Hypothyroidism    Thyroid disease     Past Surgical History:  Procedure Laterality Date   CHOLECYSTECTOMY  June 2013   COLONOSCOPY WITH PROPOFOL N/A 04/28/2019   Procedure: COLONOSCOPY WITH PROPOFOL;  Surgeon: Lin Landsman, MD;  Location: [facility];  Service: Endoscopy;  Laterality: N/A;  priority 4   HERNIA REPAIR Bilateral    inguinal   REPAIR EXTENSOR TENDON Right 04/28/2020   Procedure: EXPLORATION OF RIGHT DORSAL HAND LACERATION WITH REPAIR OF INDEX EXTENSOR TENDON LACERATION;  Surgeon: Corky Mull, MD;  Location: [facility];  Service: Orthopedics;  Laterality: Right;   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   Procedure: 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.;  Surgeon: Corky Mull, MD;  Location: [facility];  Service: Orthopedics;  Laterality: Right;   RESECTION OF HAND NEUROMA Right 03/23/2021   Procedure: 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   Subjective Assessment - 04/25/21 1031     Subjective  I done what you and the Dr said - not grippin, picking up anything -just light things in the house but yesterday after the monring session my hand swoll up and turned pumple -my wrist bothers me more the last 2 days    Pertinent History Pt had saw accident on 04/26/20 at [facility] and then Surgery by Dr Roland Rack on 04/28/20 - Primary repair of right EIP and index EDC tendons.2. Irrigation and debridement with primary repair of right dorsal hand laceration (approximately 10 cm in length). Pt was seen for OT and cont to had nerve pain -11/10/20 had traumatic Neuroma excision -NOW repeat evaluation status post a excision/reimplantation of traumatic neuroma involving the dorsal aspect of the right hand. Surgery was performed by Dr. Roland Rack on 03/23/2021. 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 The patient was doing well until he was hammering in some fence post and suffered a recurrence of his painful neuroma. The patient presents last week to surgeon off-  reporting no injury or trauma  affecting the right hand since his last surgery. He does report numbness and tingling especially in the second and third digits of the right hand. The patient take ibuprofen addition to occasional hydrocodone as needed for discomfort. Pain score today is a 5 out of 10- Refer to OT - pt to keep splint on until appt with OT this date - and to take off only for HEP and keep on untill follow up appt with surgeon 04/18/21    Patient Stated Goals Want to get the use of my R hand back to be able to work and take care of the animals and yard /garden at home    Currently in Pain? Yes     Pain Score 3     Pain Location Hand   wrist   Pain Orientation Right    Pain Descriptors / Indicators Aching;Tightness;Tender;Sore    Pain Type Surgical pain    Pain Onset More than a month ago    Pain Frequency Intermittent                [facility] OT Assessment - 04/25/21 0001       Right Hand AROM   R Index  MCP 0-90 75 Degrees    R Index PIP 0-100 90 Degrees    R Long  MCP 0-90 70 Degrees    R Long PIP 0-100 95 Degrees    R Ring PIP 0-100 80 Degrees    R Ring DIP 0-70 100 Degrees    R Little PIP 0-100 90 Degrees    R Little DIP 0-70 95 Degrees              Increase flexion of digits - pain over dorsal hand and incision - 2-3/10 -pull But report increase pain in dorsal wrist and even volar - at rest over the weekend  And in session with gripping and wrist AROM          OT Treatments/Exercises (OP) - 04/25/21 0001       Ultrasound   Ultrasound Location volar and dorsal wrist    Ultrasound Parameters 3.3MHZ, 20%, 1.0 intensity    Ultrasound Goals Pain      RUE Paraffin   Number Minutes Paraffin 8 Minutes    RUE Paraffin Location Hand   wrist   Comments 2 min open hand , loose fist - x 2 each             LESS pain and stiffness after paraffin Review with pt HEP 3 x day   Keep pain under 2/10  Slight pull  Change MC flexion with PIP extention - light stretch  Dong very well with intrinsic fist  But slight pull with composite - pt to do to red foam roller ,  marker  then 1 fingers  Keep pain under 2/10  Ed pt that looking at L hand - pt never had 90 MC flexion at 2nd and 3rd MC - thumb thenar taking space up    10 reps  Wrist AROM in all planes -  open hand then loose fist ( great progress after paraffin with this one)- but hold off if pain still 4/10  at rest  Fitted with Benik neoprene to decrease pain with light use in ADL's , and HEP  screw driver handle hold and do AROM  RD, UD  Pain less than 2/10   tapping of digits and  opposition to  all digits- increase extention 5 reps for opposition  Prayer  stretch - 10 reps - slight pull Radial  N glide- was able to do 5 reps - all 3-4 steps  with pull less than 2/10  Keep pain under 2/10          OT Education - 04/25/21 1031     Education Details progress and changes to HEP    Person(s) Educated Patient    Methods Explanation;Demonstration;Tactile cues;Verbal cues;Handout    Comprehension Verbal cues required;Returned demonstration;Verbalized understanding              OT Short Term Goals - 04/11/21 1605       OT SHORT TERM GOAL #1   Title Pt to be independent in HEP to tolerate desentitization of different textures/gentle scar massage and full extention of digits to donn gloves  with discomfort less than 2/10    Baseline at rest pain - burning 1-5/10 - numbness over dorsal 2nd and 3rd digits and in webspace- cannot toleate touching of dorsal hand over 2nd and 3rd MC's and incision    Time 4    Period Weeks    Status New    Target Date 05/09/21      OT SHORT TERM GOAL #2   Title R wrist AROM increase to WNL without increase symptoms    Baseline pain over dorsal hand -increase pull - increase with wrist flexion with gravity    Time 3    Period Weeks    Status New    Target Date 05/02/21               OT Long Term Goals - 04/11/21 1604       OT LONG TERM GOAL #1   Title Pt R hand digits flexion increase for pt to touch palm to grip and release IADLs objects with pain less than 2-3/10    Baseline in soft volar splint /cast - except with HEP, pain at rest 1-5/10 pain over dorsal hand    Time 5    Period Weeks    Status New    Target Date 05/16/21      OT LONG TERM GOAL #2   Title Grip strength increase to more than 50% compare to L to grip and pick up objects more than 5 lbs without symptoms    Baseline NT - 2 1/2 wkss/p - volar splint    Time 10    Period Weeks    Status New    Target Date 06/20/21      OT LONG TERM GOAL #3   Title  Prehension strength increase to more than 50% compare to L to cut food, open packages, use in farm with animals    Baseline 2 1/2 wks s/p - volar splint    Time 8    Period Weeks    Status New    Target Date 06/06/21      OT LONG TERM GOAL #4   Title Function increas for pt to push ,pull door open, push up from chair, hammer nail , shake bags for feeding animals    Baseline 2 1/2 wks s/p    Time 10    Period Weeks    Status New    Target Date 06/20/21                   Plan - 04/25/21 1032     Clinical Impression Statement Pt is about 11 months  s/p right repair of 2nd digit EIP and EDC with irrigation and debridement.  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 Resting pain decrease to 2/10 and show this date increase flexion of digits for composite fist but increase 3/10 and unable to touch palm -Pt do report increase wrist pain over the weekend dorsal and some volar too. Fitted  with neoprene Benik wrist wrap to use  during day with AROM and night time to decrease pain but not limit motion. Numbness over dorsal hand and 2nd/3rd digits-    REINFORCE AGAIN WITH pt - GOAL to increase AROM/use and strength  without increasing pain - pain should decrease while AROM in digits and wrist increase . Composite  digits flexion improve since last week.  Pt to cont with moist heat prior to AROM and AAROM with extention inbetween, wrist and thumb AROM  in all planes - keep pain under 2/10.  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    OT Occupational Profile and History Problem Focused Assessment - Including review of records relating to presenting problem    Occupational performance deficits (Please refer to evaluation for details): ADL's;IADL's;Rest and Sleep;Work;Play;Leisure;Social Participation    Body Structure / Function / Physical Skills ADL;Edema;Dexterity;Decreased knowledge of use of DME;Decreased knowledge of precautions;Flexibility;ROM;UE functional use;Scar mobility;Pain;Strength;IADL;Coordination    Rehab Potential Fair    Clinical Decision Making Limited treatment options, no task modification necessary    Comorbidities Affecting Occupational Performance: None    Modification or Assistance to Complete Evaluation  No modification of tasks or assist necessary to complete eval    OT Frequency 2x / week    OT Duration 8 weeks    OT Treatment/Interventions Self-care/ADL training;Contrast Bath;Fluidtherapy;Paraffin;Therapeutic exercise;Ultrasound;Manual Therapy;Passive range of motion;Scar mobilization;Splinting;Patient/family education;Therapeutic activities    Consulted and Agree with Plan of Care Patient             Patient will benefit from skilled therapeutic intervention in order to improve the following deficits and impairments:   Body Structure / Function / Physical Skills: ADL, Edema, Dexterity, Decreased knowledge of use of DME, Decreased knowledge of precautions, Flexibility, ROM, UE functional use, Scar mobility, Pain, Strength, IADL, Coordination       Visit Diagnosis: Pain in right hand  Stiffness of right wrist, not elsewhere classified  Scar condition and fibrosis of skin  Stiffness of right hand, not elsewhere classified  Muscle weakness (generalized)    Problem List Patient  Active Problem List   Diagnosis Date Noted   Toothache 02/20/2020   Headache 02/20/2020   Smoking 66/08/3014   Umbilical hernia, incarcerated    Back pain 03/27/2019   Benign hypertension 05/02/2018   Obesity (BMI 30.0-34.9) 04/30/2018   Healthcare maintenance 10/19/2016   Flu vaccine need 04/20/2016   GERD (gastroesophageal reflux  disease) 04/01/2015   Gout 04/01/2015   Mixed hyperlipidemia 10/01/2014   Hypothyroidism 05/21/2014    Rosalyn Gess, OTR/L,CLT 04/25/2021, 12:16 PM  [facility] [address] [place], [address] Phone: [phone]   Fax:  [phone]  Name: OCTAVIANO MUKAI MRN: [id number] Date of Birth: [birth year]

## 2021-04-26 ENCOUNTER — Ambulatory Visit (INDEPENDENT_AMBULATORY_CARE_PROVIDER_SITE_OTHER): Payer: Self-pay | Admitting: Urology

## 2021-04-26 ENCOUNTER — Encounter: Payer: Self-pay | Admitting: Urology

## 2021-04-26 VITALS — BP 136/86 | Ht 69.0 in | Wt 221.0 lb

## 2021-04-26 DIAGNOSIS — N529 Male erectile dysfunction, unspecified: Secondary | ICD-10-CM

## 2021-04-26 NOTE — Progress Notes (Signed)
Patient who is self-pay, [facility]   He denies any problems with erections regularly and is able to achieve orgasm, denies any urinary symptoms or gross hematuria.  It sounds like he takes Viagra more recreationally every 1-2 months when he wants " an extra boost."  We discussed potential relationships between ED and heart disease, and the importance of continued routine care for cholesterol check, blood pressure.  He denies any chest pain.  No further urology work-up needed.  Douglas Madrid, MD 04/26/2021

## 2021-04-27 ENCOUNTER — Ambulatory Visit: Payer: PRIVATE HEALTH INSURANCE | Attending: Student | Admitting: Occupational Therapy

## 2021-04-27 ENCOUNTER — Other Ambulatory Visit: Payer: Self-pay

## 2021-04-27 DIAGNOSIS — L905 Scar conditions and fibrosis of skin: Secondary | ICD-10-CM | POA: Diagnosis present

## 2021-04-27 DIAGNOSIS — M79641 Pain in right hand: Secondary | ICD-10-CM | POA: Insufficient documentation

## 2021-04-27 DIAGNOSIS — M6281 Muscle weakness (generalized): Secondary | ICD-10-CM | POA: Insufficient documentation

## 2021-04-27 DIAGNOSIS — M25641 Stiffness of right hand, not elsewhere classified: Secondary | ICD-10-CM | POA: Diagnosis present

## 2021-04-27 DIAGNOSIS — M25631 Stiffness of right wrist, not elsewhere classified: Secondary | ICD-10-CM | POA: Insufficient documentation

## 2021-04-27 NOTE — Therapy (Signed)
[place] [facility] [address] [place], [address] Phone: [phone]   Fax:  [phone]  Occupational Therapy Treatment  Patient Details  Name: Douglas Bird MRN: [id number] Date of Birth: [birth year] Referring Provider (OT): Dr Roland Rack   Encounter Date: 04/27/2021   OT End of Session - 04/27/21 1459     Visit Number 6    Number of Visits 16    Date for OT Re-Evaluation 06/20/21    OT Start Time 1660    OT Stop Time 1518    OT Time Calculation (min) 33 min    Activity Tolerance Patient tolerated treatment well    Behavior During Therapy [facility] for tasks assessed/performed             Past Medical History:  Diagnosis Date   Diverticulitis 2011   GERD (gastroesophageal reflux disease)    Hyperlipidemia    Hypertension    controlled on meds   Hypothyroidism    Thyroid disease     Past Surgical History:  Procedure Laterality Date   CHOLECYSTECTOMY  June 2013   COLONOSCOPY WITH PROPOFOL N/A 04/28/2019   Procedure: COLONOSCOPY WITH PROPOFOL;  Surgeon: Lin Landsman, MD;  Location: [facility];  Service: Endoscopy;  Laterality: N/A;  priority 4   HERNIA REPAIR Bilateral    inguinal   REPAIR EXTENSOR TENDON Right 04/28/2020   Procedure: EXPLORATION OF RIGHT DORSAL HAND LACERATION WITH REPAIR OF INDEX EXTENSOR TENDON LACERATION;  Surgeon: Corky Mull, MD;  Location: [facility];  Service: Orthopedics;  Laterality: Right;   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   Procedure: 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.;  Surgeon: Corky Mull, MD;  Location: [facility];  Service: Orthopedics;  Laterality: Right;   RESECTION OF HAND NEUROMA Right 03/23/2021   Procedure: 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   Subjective Assessment - 04/27/21 1457     Subjective  Feel so much better -no pain - wore the glove and soft blue splint on my wrist most of the time - was able to do light cooking laundry  nothing heavy -and had no pain - doing different textures  and doing better    Pertinent History Pt had saw accident on 04/26/20 at [facility] and then Surgery by Dr Roland Rack on 04/28/20 - Primary repair of right EIP and index EDC tendons.2. Irrigation and debridement with primary repair of right dorsal hand laceration (approximately 10 cm in length). Pt was seen for OT and cont to had nerve pain -11/10/20 had traumatic Neuroma excision -NOW repeat evaluation status post a excision/reimplantation of traumatic neuroma involving the dorsal aspect of the right hand. Surgery was performed by Dr. Roland Rack on 03/23/2021. 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 The patient was doing well until he was hammering in some fence post and suffered a recurrence of his painful neuroma. The patient presents last week to surgeon off-  reporting no injury  or trauma affecting the right hand since his last surgery. He does report numbness and tingling especially in the second and third digits of the right hand. The patient take ibuprofen addition to occasional hydrocodone as needed for discomfort. Pain score today is a 5 out of 10- Refer to OT - pt to keep splint on until appt with OT this date - and to take off only for HEP and keep on untill follow up appt with surgeon 04/18/21    Patient Stated Goals Want to get the use of my R hand back to be able to work and take care of the animals and yard /garden at home    Currently in Pain?  No/denies                [facility] OT Assessment - 04/27/21 0001       AROM   Right Wrist Extension 70 Degrees    Right Wrist Flexion 90 Degrees      Right Hand AROM   R Index  MCP 0-90 75 Degrees    R Index PIP 0-100 95 Degrees    R Long  MCP 0-90 70 Degrees    R Long PIP 0-100 95 Degrees    R Ring PIP 0-100 80 Degrees    R Ring DIP 0-70 100 Degrees    R Little PIP 0-100 90 Degrees    R Little DIP 0-70 90 Degrees              Great progress since last time - no swelling coming in - no pain in composite fist and wrist AROM  No pain with isotoner and without isotoner  Tenderness just over scar  But after fluido and AROM had some tenderness over MC's 2nd and 3rd  But no pain with AROM wrist and fisting         OT Treatments/Exercises (OP) - 04/27/21 0001       RUE Fluidotherapy   Number Minutes Fluidotherapy 8 Minutes    RUE Fluidotherapy Location Hand;Wrist    Comments AROM for wrist and hand in all planes             simulate gripping large tool and smaller tool -but less than 1 lbs - RD, UD , sup /pro no issues  Tapping lightly on towel - 10 reps   X 5 - had after 5 min little more swelling and soreness 2/10  Pt to cont with light house work in [employer] same HEP as last time -and using Benik neoprene and isotoner glove to decrease pain with light use in ADL's , and HEP    HEP for tendon glides , opposition , tapping of digits Wrist AROM and AAROM  Radial  N glide- was able to do 5 reps - all 3-4 steps  with pull less than 2/10  Keep pain under 2/10                OT Education - 04/27/21 1459     Education Details progress and changes to HEP    Person(s) Educated Patient    Methods Explanation;Demonstration;Tactile cues;Verbal cues;Handout    Comprehension Verbal cues required;Returned demonstration;Verbalized understanding              OT Short Term Goals - 04/11/21 1605       OT SHORT TERM GOAL #1   Title  Pt to be independent in HEP to tolerate desentitization of different textures/gentle scar massage and full  extention of digits to donn gloves  with discomfort less than 2/10    Baseline at rest pain - burning 1-5/10 - numbness over dorsal 2nd and 3rd digits and in webspace- cannot toleate touching of dorsal hand over 2nd and 3rd MC's and incision    Time 4    Period Weeks    Status New    Target Date 05/09/21      OT SHORT TERM GOAL #2   Title R wrist AROM increase to WNL without increase symptoms    Baseline pain over dorsal hand -increase pull - increase with wrist flexion with gravity    Time 3    Period Weeks    Status New    Target Date 05/02/21               OT Long Term Goals - 04/11/21 1604       OT LONG TERM GOAL #1   Title Pt R hand digits flexion increase for pt to touch palm to grip and release IADLs objects with pain less than 2-3/10    Baseline in soft volar splint /cast - except with HEP, pain at rest 1-5/10 pain over dorsal hand    Time 5    Period Weeks    Status New    Target Date 05/16/21      OT LONG TERM GOAL #2   Title Grip strength increase to more than 50% compare to L to grip and pick up objects more than 5 lbs without symptoms    Baseline NT - 2 1/2 wkss/p - volar splint    Time 10    Period Weeks    Status New    Target Date 06/20/21      OT LONG TERM GOAL #3   Title Prehension strength increase to more than 50% compare to L to cut food, open packages, use in farm with animals    Baseline 2 1/2 wks s/p - volar splint    Time 8    Period Weeks    Status New    Target Date 06/06/21      OT LONG TERM GOAL #4   Title Function increas for pt to push ,pull door open, push up from chair, hammer nail , shake bags for feeding animals    Baseline 2 1/2 wks s/p    Time 10    Period Weeks    Status New    Target Date 06/20/21                   Plan - 04/27/21 1519     Clinical Impression Statement Pt is about 11 months  s/p right  repair of 2nd digit EIP and EDC with irrigation and debridement.  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 Numbness over dorsal hand and 2nd/3rd digits but pt report  improving some. DId this date some light tool less than 1 lbs doing RD, UD , sup/pro and tapping on towel - tolerate well no pain -for about 5 x 10 reps - had some swelling and soreness afterwards 5 min less than 2/10.  REINFORCE AGAIN WITH pt - GOAL to increase and maintian now AROM pain free - and decrease tenderness and sensitivy over scar.  Pt to cont with moist heat prior to AROM and AAROM with extention inbetween, wrist and thumb AROM  in all planes - keep pain under 2/10.  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    OT Occupational Profile and History Problem Focused Assessment - Including review of records relating to presenting problem    Occupational performance deficits (Please refer to evaluation for details): ADL's;IADL's;Rest and Sleep;Work;Play;Leisure;Social Participation    Body Structure / Function / Physical Skills ADL;Edema;Dexterity;Decreased knowledge of use of DME;Decreased knowledge of precautions;Flexibility;ROM;UE functional use;Scar mobility;Pain;Strength;IADL;Coordination     Rehab Potential Fair    Clinical Decision Making Limited treatment options, no task modification necessary    Comorbidities Affecting Occupational Performance: None    Modification or Assistance to Complete Evaluation  No modification of tasks or assist necessary to complete eval    OT Frequency 2x / week    OT Duration 8 weeks    OT Treatment/Interventions Self-care/ADL training;Contrast Bath;Fluidtherapy;Paraffin;Therapeutic exercise;Ultrasound;Manual Therapy;Passive range of motion;Scar mobilization;Splinting;Patient/family education;Therapeutic activities    Consulted and Agree with Plan of Care Patient             Patient will benefit from skilled therapeutic intervention in order to improve the following deficits and impairments:   Body Structure / Function / Physical Skills: ADL, Edema, Dexterity, Decreased knowledge of use of DME, Decreased knowledge of precautions, Flexibility, ROM, UE functional use, Scar mobility, Pain, Strength, IADL, Coordination       Visit Diagnosis: Pain in right hand  Stiffness of right wrist, not elsewhere classified  Scar condition and fibrosis of skin  Stiffness of right hand, not elsewhere classified  Muscle weakness (generalized)    Problem List Patient Active Problem List   Diagnosis Date Noted   Toothache 02/20/2020   Headache 02/20/2020   Smoking 40/98/1191   Umbilical hernia, incarcerated    Back pain 03/27/2019   Benign hypertension 05/02/2018   Obesity (BMI 30.0-34.9) 04/30/2018   Healthcare maintenance 10/19/2016   Flu vaccine need 04/20/2016   GERD (gastroesophageal reflux disease) 04/01/2015   Gout 04/01/2015   Mixed hyperlipidemia 10/01/2014   Hypothyroidism 05/21/2014    Rosalyn Gess, OTR/L,CLT 04/27/2021, 3:23 PM  [place] [facility] [address], [place], [address] Phone: [phone]   Fax:  [phone]  Name: SAMULE LIFE MRN:  [id number] Date of Birth: 1965-11-24

## 2021-04-29 ENCOUNTER — Other Ambulatory Visit: Payer: Self-pay

## 2021-05-02 ENCOUNTER — Ambulatory Visit: Payer: PRIVATE HEALTH INSURANCE | Admitting: Occupational Therapy

## 2021-05-05 ENCOUNTER — Ambulatory Visit: Payer: PRIVATE HEALTH INSURANCE | Admitting: Occupational Therapy

## 2021-05-05 ENCOUNTER — Other Ambulatory Visit: Payer: Self-pay

## 2021-05-05 DIAGNOSIS — L905 Scar conditions and fibrosis of skin: Secondary | ICD-10-CM

## 2021-05-05 DIAGNOSIS — M6281 Muscle weakness (generalized): Secondary | ICD-10-CM

## 2021-05-05 DIAGNOSIS — M25641 Stiffness of right hand, not elsewhere classified: Secondary | ICD-10-CM

## 2021-05-05 DIAGNOSIS — M79641 Pain in right hand: Secondary | ICD-10-CM

## 2021-05-05 DIAGNOSIS — M25631 Stiffness of right wrist, not elsewhere classified: Secondary | ICD-10-CM

## 2021-05-05 NOTE — Therapy (Signed)
[place] [facility] [address] [place], [address] Phone: [phone]   Fax:  [phone]  Occupational Therapy Treatment  Patient Details  Name: Douglas Bird MRN: [id number] Date of Birth: October 20, 1965 Referring Provider (OT): Dr Roland Rack   Encounter Date: 05/05/2021   OT End of Session - 05/05/21 0814     Visit Number 7    Number of Visits 16    Date for OT Re-Evaluation 06/20/21    OT Start Time 0815    OT Stop Time 0901    OT Time Calculation (min) 46 min    Activity Tolerance Patient tolerated treatment well    Behavior During Therapy [facility] for tasks assessed/performed             Past Medical History:  Diagnosis Date   Diverticulitis 2011   GERD (gastroesophageal reflux disease)    Hyperlipidemia    Hypertension    controlled on meds   Hypothyroidism    Thyroid disease     Past Surgical History:  Procedure Laterality Date   CHOLECYSTECTOMY  June 2013   COLONOSCOPY WITH PROPOFOL N/A 04/28/2019   Procedure: COLONOSCOPY WITH PROPOFOL;  Surgeon: Lin Landsman, MD;  Location: [place];  Service: Endoscopy;  Laterality: N/A;  priority 4   HERNIA REPAIR Bilateral    inguinal   REPAIR EXTENSOR TENDON Right 04/28/2020   Procedure: EXPLORATION OF RIGHT DORSAL HAND LACERATION WITH REPAIR OF INDEX EXTENSOR TENDON LACERATION;  Surgeon: Corky Mull, MD;  Location: [facility];  Service: Orthopedics;  Laterality: Right;   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   Procedure: 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.;  Surgeon: Corky Mull, MD;  Location: [facility];  Service: Orthopedics;  Laterality: Right;   RESECTION OF HAND NEUROMA Right 03/23/2021   Procedure: 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   Subjective Assessment - 05/05/21 0814     Subjective  I used my hand around the house like you told me - light cooking, laundry , making bed -but nothing heavy more than 1 lbs - I cook with iron -so did not do that - collect eggs around the chickens - wrist still bothers me at times and then this morning stiff and pull from scar to wrist - tight / tender still    Pertinent History Pt had saw accident on 04/26/20 at [facility] and then Surgery by Dr Roland Rack on 04/28/20 - Primary repair of right EIP and index EDC tendons.2. Irrigation and debridement with primary repair of right dorsal hand laceration (approximately 10 cm in length). Pt was seen for OT and cont to had nerve pain -11/10/20 had traumatic Neuroma excision -NOW repeat evaluation status post a excision/reimplantation of traumatic neuroma involving the dorsal aspect of the right hand. Surgery was performed by Dr. Roland Rack on 03/23/2021. 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 The patient was doing well until he was hammering in some fence post  and suffered a recurrence of his painful neuroma. The patient presents last week to surgeon off-  reporting no injury or trauma affecting the right hand since his last surgery. He does report numbness and tingling especially in the second and third digits of the right hand. The patient take ibuprofen addition to occasional hydrocodone as needed for discomfort. Pain score today is a 5 out of 10- Refer to OT - pt to keep splint on until appt with OT this date - and to take off only for HEP and keep on untill follow up appt with surgeon 04/18/21    Patient Stated Goals Want to get the use of my R hand back to be  able to work and take care of the animals and yard /garden at home    Currently in Pain? Yes    Pain Score 2     Pain Location Hand    Pain Orientation Right    Pain Descriptors / Indicators Tender;Tightness;Burning    Pain Type Surgical pain    Pain Onset More than a month ago    Pain Frequency Intermittent                [facility] OT Assessment - 05/05/21 0001       AROM   Right Wrist Extension 70 Degrees    Right Wrist Flexion 90 Degrees      Strength   Right Hand Grip (lbs) 62   60 without glove pain increase 3/10   Right Hand Lateral Pinch 28 lbs    Right Hand 3 Point Pinch 17 lbs   pain increase 3/10   Left Hand Grip (lbs) 85    Left Hand Lateral Pinch 27 lbs    Left Hand 3 Point Pinch 19 lbs      Right Hand AROM   R Index  MCP 0-90 75 Degrees    R Index PIP 0-100 95 Degrees    R Long  MCP 0-90 70 Degrees    R Long PIP 0-100 96 Degrees    R Ring PIP 0-100 80 Degrees    R Ring DIP 0-70 100 Degrees    R Little PIP 0-100 90 Degrees    R Little DIP 0-70 96 Degrees             Pt 6 wks s/p from last surgery yesterday Arrive with 2/10 pain , pull,burning sensation with making fist and wrist flexion- am today his appt -usually afternoon Did great last week  Pt does better with compression glove on with AROM and pain /nerve pain /sensation          OT Treatments/Exercises (OP) - 05/05/21 0001       RUE Fluidotherapy   Number Minutes Fluidotherapy 8 Minutes    RUE Fluidotherapy Location Hand;Wrist    Comments AROM for wrist and digits in all planes            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[employer], [employer] , making bed since last week Pt wrist pain cont to be better -but still some at times for wrist flexion  Pt to wear Benik neoprene wrap no only with activities that bother wrist  But cont with Isotoner glove for compression over dorsal hand       HEP for tendon glides , opposition , tapping of digits to cont after  heat or contrast  Wrist AROM  And upgrade to 2 lbs - with compression glove  on did not had pain this date -except pull in extention -stop when feeling pull 10 reps -15 reps  1 x day -but keep pain 0-1/10  Light gripping into easy yellow block - pain free 2 x day 10 reps  Radial  N glide- was able to do 5 reps - all 3-4 steps  with no pull in session  Can cont 2-3 x day - pain free -stop prior to pull  Keep pain under 0-1/10         OT Education - 05/05/21 0814     Education Details progress and changes to HEP    Person(s) Educated Patient    Methods Explanation;Demonstration;Tactile cues;Verbal cues;Handout    Comprehension Verbal cues required;Returned demonstration;Verbalized understanding              OT Short Term Goals - 04/11/21 1605       OT SHORT TERM GOAL #1   Title Pt to be independent in HEP to tolerate desentitization of different textures/gentle scar massage and full extention of digits to donn gloves  with discomfort less than 2/10    Baseline at rest pain - burning 1-5/10 - numbness over dorsal 2nd and 3rd digits and in webspace- cannot toleate touching of dorsal hand over 2nd and 3rd MC's and incision    Time 4    Period Weeks    Status New    Target Date 05/09/21      OT SHORT TERM GOAL #2   Title R wrist AROM increase to WNL without increase symptoms    Baseline pain over dorsal hand -increase pull - increase with wrist flexion with gravity    Time 3    Period Weeks    Status New    Target Date 05/02/21               OT Long Term Goals - 04/11/21 1604       OT LONG TERM GOAL #1   Title Pt R hand digits flexion increase for pt to touch palm to grip and release IADLs objects with pain less than 2-3/10    Baseline in soft volar splint /cast - except with HEP, pain at rest 1-5/10 pain over dorsal hand    Time 5    Period Weeks    Status New    Target Date 05/16/21      OT LONG TERM GOAL #2   Title Grip strength increase to more than 50%  compare to L to grip and pick up objects more than 5 lbs without symptoms    Baseline NT - 2 1/2 wkss/p - volar splint    Time 10    Period Weeks    Status New    Target Date 06/20/21      OT LONG TERM GOAL #3   Title Prehension strength increase to more than 50% compare to L to cut food, open packages, use in farm with animals    Baseline 2 1/2 wks s/p - volar splint    Time 8    Period Weeks    Status New    Target Date 06/06/21      OT LONG TERM GOAL #4   Title Function increas for pt to push ,pull door open, push up from chair, hammer nail , shake bags for feeding animals    Baseline 2 1/2 wks s/p    Time 10    Period Weeks    Status New    Target Date 06/20/21  Plan - 05/05/21 0815     Clinical Impression Statement Pt is about 11 months  s/p right repair of 2nd digit EIP and EDC with irrigation and debridement.  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 Numbness over dorsal hand and 2nd/3rd digits but pt report improving some. This week pt came in 2/10 tightness, stiffness ,pull and burning pain with wrist ext and composite fist - but was able to decrease to 0/10 in session and does better with compression glove when initiating some strengthening today. Was able to keep pain 0/10 today with 2 lbs except extention , and light gripping into easy foam block- REINFORCE again as we upgrade strengthening now goal is to keep pain 0-1/10.  Pt to cont  with moist heat/ contrast prior and during HEP and activities to decrease pain.  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    OT Occupational Profile and History Problem Focused Assessment - Including review of records relating to presenting problem    Occupational performance deficits (Please refer to evaluation for details): ADL's;IADL's;Rest and Sleep;Work;Play;Leisure;Social Participation    Body Structure / Function / Physical Skills ADL;Edema;Dexterity;Decreased knowledge of use of DME;Decreased knowledge of precautions;Flexibility;ROM;UE functional use;Scar mobility;Pain;Strength;IADL;Coordination    Rehab Potential Fair    Clinical Decision Making Limited treatment options, no task modification necessary    Comorbidities Affecting Occupational Performance: None    Modification or Assistance to Complete Evaluation  No modification of tasks or assist necessary to complete eval    OT Frequency 2x / week    OT Duration 8 weeks    OT Treatment/Interventions Self-care/ADL training;Contrast Bath;Fluidtherapy;Paraffin;Therapeutic exercise;Ultrasound;Manual Therapy;Passive range of motion;Scar mobilization;Splinting;Patient/family education;Therapeutic activities    Consulted and Agree with Plan of Care Patient             Patient will benefit from skilled therapeutic intervention in order to improve the following deficits and impairments:   Body Structure / Function / Physical Skills: ADL, Edema, Dexterity, Decreased knowledge of use of DME, Decreased knowledge of precautions, Flexibility, ROM, UE functional use, Scar mobility, Pain, Strength, IADL, Coordination       Visit Diagnosis: Pain in right hand  Stiffness of right wrist, not elsewhere  classified  Scar condition and fibrosis of skin  Stiffness of right hand, not elsewhere classified  Muscle weakness (generalized)    Problem List Patient Active Problem List   Diagnosis Date Noted   Toothache 02/20/2020   Headache 02/20/2020   Smoking 13/24/4010   Umbilical hernia, incarcerated    Back pain 03/27/2019   Benign hypertension 05/02/2018   Obesity (BMI 30.0-34.9) 04/30/2018   Healthcare maintenance 10/19/2016  Flu vaccine need 04/20/2016   GERD (gastroesophageal reflux disease) 04/01/2015   Gout 04/01/2015   Mixed hyperlipidemia 10/01/2014   Hypothyroidism 05/21/2014    Rosalyn Gess, OT 05/05/2021, 9:41 AM  [place] [facility] [address] [place], [address] Phone: [phone]   Fax:  [phone]  Name: Douglas Bird MRN: [id number] Date of Birth: [birth year]

## 2021-05-10 ENCOUNTER — Other Ambulatory Visit: Payer: Self-pay

## 2021-05-10 ENCOUNTER — Ambulatory Visit: Payer: PRIVATE HEALTH INSURANCE | Attending: Student | Admitting: Occupational Therapy

## 2021-05-10 DIAGNOSIS — L905 Scar conditions and fibrosis of skin: Secondary | ICD-10-CM

## 2021-05-10 DIAGNOSIS — M25641 Stiffness of right hand, not elsewhere classified: Secondary | ICD-10-CM

## 2021-05-10 DIAGNOSIS — M79641 Pain in right hand: Secondary | ICD-10-CM | POA: Diagnosis not present

## 2021-05-10 DIAGNOSIS — M6281 Muscle weakness (generalized): Secondary | ICD-10-CM | POA: Diagnosis present

## 2021-05-10 DIAGNOSIS — M25631 Stiffness of right wrist, not elsewhere classified: Secondary | ICD-10-CM

## 2021-05-10 NOTE — Therapy (Signed)
[place] [facility] [address], [place], [address] Phone: [phone]   Fax:  [phone]  Occupational Therapy Treatment  Patient Details  Name: Douglas Bird MRN: [id number] Date of Birth: [birth year] Referring Provider (OT): Dr Roland Rack   Encounter Date: 05/10/2021   OT End of Session - 05/10/21 0853     Visit Number 8    Number of Visits 16    Date for OT Re-Evaluation 06/20/21    OT Start Time 0820    OT Stop Time 0853    OT Time Calculation (min) 33 min    Activity Tolerance Patient tolerated treatment well    Behavior During Therapy [facility] for tasks assessed/performed             Past Medical History:  Diagnosis Date   Diverticulitis 2011   GERD (gastroesophageal reflux disease)    Hyperlipidemia    Hypertension    controlled on meds   Hypothyroidism    Thyroid disease     Past Surgical History:  Procedure Laterality Date   CHOLECYSTECTOMY  June 2013   COLONOSCOPY WITH PROPOFOL N/A 04/28/2019   Procedure: COLONOSCOPY WITH PROPOFOL;  Surgeon: Lin Landsman, MD;  Location: [place];  Service: Endoscopy;  Laterality: N/A;  priority 4   HERNIA REPAIR Bilateral    inguinal   REPAIR EXTENSOR TENDON Right 04/28/2020   Procedure: EXPLORATION OF RIGHT DORSAL HAND LACERATION WITH REPAIR OF INDEX EXTENSOR TENDON LACERATION;  Surgeon: Corky Mull, MD;  Location: [facility];  Service: Orthopedics;  Laterality: Right;   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   Procedure: 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.;  Surgeon: Corky Mull, MD;  Location: [facility];  Service: Orthopedics;  Laterality: Right;   RESECTION OF HAND NEUROMA Right 03/23/2021   Procedure: 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   Subjective Assessment - 05/10/21 0852     Subjective  I seen Dr Roland Rack and he said this time it looks good- nerver looking good- numbness is getting better in my index and middle finger- able to do the 2 lbs weight and yellow foam block - wearing compression glove but less than 25% the wrist wrap    Pertinent History Pt had saw accident on 04/26/20 at [facility] and then Surgery by Dr Roland Rack on 04/28/20 - Primary repair of right EIP and index EDC tendons.2. Irrigation and debridement with primary repair of right dorsal hand laceration (approximately 10 cm in length). Pt was seen for OT and cont to had nerve pain -11/10/20 had traumatic Neuroma excision -NOW repeat evaluation status post a excision/reimplantation of traumatic neuroma involving the dorsal aspect of the right hand. Surgery was performed by Dr. Roland Rack on 03/23/2021. 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 The patient was doing well until he was hammering in some fence post and suffered a recurrence of his painful neuroma. The patient presents last week to surgeon off-  reporting no injury or trauma affecting the right hand since his last surgery. He does report numbness and tingling especially in the second and third digits of the right hand. The patient take ibuprofen addition to occasional hydrocodone as needed for discomfort. Pain score today is a 5 out of 10- Refer to OT - pt to keep splint on until appt with OT this date - and to take off only for HEP and keep on untill follow up appt with surgeon 04/18/21    Patient Stated Goals Want to get the use of my R hand back to be able to work and take care of the animals and yard /garden at home     Currently in Pain? 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[employer], petroleum , making bed - about 2 lbs objects Pt wrist pain cont to be better weaning out of Benik wrist wrap - less than 25% But cont with Isotoner glove for compression over dorsal hand as needed        HEP for tendon glides , opposition , tapping of digits to cont after heat or contrast  Wrist AROM in all planes for warm up  Upgrade today to 3 lbs - with compression glove on did not had pain this date -except pull in extention -stop when feeling pull or do place and hold 15 reps  3 x day -but keep pain 0-1/10  Light gripping into  medium Pink  foam block - pain free 2 x day 15 reps  Radial  N glide- was able to do 5 reps - all 3-4 steps  with no pull in session  Can cont 2-3 x day - pain free -stop prior to pull  Keep pain under 0-1/10                   OT Education - 05/10/21 0853     Education Details progress and changes to HEP    Person(s) Educated Patient    Methods Explanation;Demonstration;Tactile cues;Verbal cues;Handout    Comprehension Verbal cues required;Returned demonstration;Verbalized understanding              OT Short Term Goals - 04/11/21 1605       OT SHORT TERM GOAL #1   Title Pt to be independent in HEP to tolerate desentitization of different textures/gentle scar massage and full extention of digits to donn gloves  with discomfort less than 2/10    Baseline at rest pain - burning 1-5/10 - numbness over dorsal 2nd and 3rd  digits and in webspace- cannot toleate touching of dorsal hand over 2nd and 3rd MC's and incision    Time 4    Period Weeks    Status New    Target Date 05/09/21  OT SHORT TERM GOAL #2   Title R wrist AROM increase to WNL without increase symptoms    Baseline pain over dorsal hand -increase pull - increase with wrist flexion with gravity    Time 3    Period Weeks    Status New    Target Date 05/02/21               OT Long Term Goals - 04/11/21 1604       OT LONG TERM GOAL #1   Title Pt R hand digits flexion increase for pt to touch palm to grip and release IADLs objects with pain less than 2-3/10    Baseline in soft volar splint /cast - except with HEP, pain at rest 1-5/10 pain over dorsal hand    Time 5    Period Weeks    Status New    Target Date 05/16/21      OT LONG TERM GOAL #2   Title Grip strength increase to more than 50% compare to L to grip and pick up objects more than 5 lbs without symptoms    Baseline NT - 2 1/2 wkss/p - volar splint    Time 10    Period Weeks    Status New    Target Date 06/20/21      OT LONG TERM GOAL #3   Title Prehension strength increase to more than 50% compare to L to cut food, open packages, use in farm with animals    Baseline 2 1/2 wks s/p - volar splint    Time 8    Period Weeks    Status New    Target Date 06/06/21      OT LONG TERM GOAL #4   Title Function increas for pt to push ,pull door open, push up from chair, hammer nail , shake bags for feeding animals    Baseline 2 1/2 wks s/p    Time 10    Period Weeks    Status New    Target Date 06/20/21                   Plan - 05/10/21 0853     Clinical Impression Statement Pt is about 12 months  s/p right repair of 2nd digit EIP and EDC with irrigation and debridement.  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 Numbness over dorsal hand and 2nd/3rd digits but pt report improving. Upgrade him to 3 lbs weight for all wrist motion - but can place and hold for extention if needed -  and wear compression glove  if pain free. Light gripping into medium foam block- REINFORCE again as we upgrade strengthening now goal is to keep pain 0-1/10. Pt was able to tolerate light tapping on foam block with large handle screw  driver - 4 x 15 sec pain free.  Upgrade HEP to 3 lbs and med foam block. Keep HEP pain free , increase use in activities less than 3 lbs and wear glove if needed. Cont with moist heat/ contrast prior and during HEP and activities to decrease pain if needed.  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    OT Occupational Profile and History Problem Focused Assessment - Including review of records relating to presenting problem    Occupational performance deficits (Please refer to evaluation for details): ADL's;IADL's;Rest and Sleep;Work;Play;Leisure;Social Participation    Body Structure / Function / Physical Skills ADL;Edema;Dexterity;Decreased knowledge of use of DME;Decreased knowledge of precautions;Flexibility;ROM;UE functional use;Scar mobility;Pain;Strength;IADL;Coordination    Rehab Potential Fair    Clinical Decision Making Limited treatment options, no task modification  necessary    Comorbidities Affecting Occupational Performance: None    Modification or Assistance to Complete Evaluation  No modification of tasks or assist necessary to complete eval    OT Frequency 2x / week    OT Duration 8 weeks    OT Treatment/Interventions Self-care/ADL training;Contrast Bath;Fluidtherapy;Paraffin;Therapeutic exercise;Ultrasound;Manual Therapy;Passive range of motion;Scar mobilization;Splinting;Patient/family education;Therapeutic activities    Consulted and Agree with Plan of Care Patient             Patient will benefit from skilled therapeutic intervention in order to improve the following deficits and impairments:   Body Structure / Function / Physical Skills: ADL, Edema, Dexterity, Decreased knowledge of use of DME, Decreased knowledge of precautions, Flexibility, ROM, UE functional use, Scar mobility, Pain, Strength, IADL, Coordination       Visit Diagnosis: Pain in right hand  Stiffness of right wrist, not elsewhere classified  Scar condition and fibrosis of skin  Stiffness of right hand, not elsewhere classified  Muscle weakness (generalized)    Problem List Patient Active Problem List   Diagnosis Date Noted   Toothache 02/20/2020   Headache 02/20/2020   Smoking 17/79/3903   Umbilical hernia, incarcerated    Back pain 03/27/2019   Benign hypertension 05/02/2018   Obesity (BMI 30.0-34.9) 04/30/2018   Healthcare maintenance 10/19/2016   Flu vaccine need 04/20/2016   GERD (gastroesophageal reflux disease) 04/01/2015   Gout 04/01/2015   Mixed hyperlipidemia 10/01/2014   Hypothyroidism 05/21/2014    Rosalyn Gess, OTR/L,CLT 05/10/2021, 9:01 AM  [place] [facility] [address] [place], [address] Phone: [phone]   Fax:  [phone]  Name: Douglas Bird MRN: [id number] Date of Birth: 1965-03-23

## 2021-05-12 ENCOUNTER — Ambulatory Visit: Payer: PRIVATE HEALTH INSURANCE | Admitting: Occupational Therapy

## 2021-05-12 DIAGNOSIS — M6281 Muscle weakness (generalized): Secondary | ICD-10-CM

## 2021-05-12 DIAGNOSIS — M79641 Pain in right hand: Secondary | ICD-10-CM

## 2021-05-12 DIAGNOSIS — L905 Scar conditions and fibrosis of skin: Secondary | ICD-10-CM

## 2021-05-12 DIAGNOSIS — M25631 Stiffness of right wrist, not elsewhere classified: Secondary | ICD-10-CM

## 2021-05-12 DIAGNOSIS — M25641 Stiffness of right hand, not elsewhere classified: Secondary | ICD-10-CM

## 2021-05-12 NOTE — Therapy (Signed)
[place] [facility] [address] [place], [address] Phone: [phone]   Fax:  [phone]  Occupational Therapy Treatment  Patient Details  Name: Douglas Bird MRN: [id number] Date of Birth: 1965-10-18 Referring Provider (OT): Dr Roland Rack   Encounter Date: 05/12/2021   OT End of Session - 05/12/21 1007     Visit Number 9    Number of Visits 16    Date for OT Re-Evaluation 06/20/21    OT Start Time 0902    OT Stop Time 0945    OT Time Calculation (min) 43 min    Activity Tolerance Patient tolerated treatment well    Behavior During Therapy [facility] for tasks assessed/performed             Past Medical History:  Diagnosis Date   Diverticulitis 2011   GERD (gastroesophageal reflux disease)    Hyperlipidemia    Hypertension    controlled on meds   Hypothyroidism    Thyroid disease     Past Surgical History:  Procedure Laterality Date   CHOLECYSTECTOMY  June 2013   COLONOSCOPY WITH PROPOFOL N/A 04/28/2019   Procedure: COLONOSCOPY WITH PROPOFOL;  Surgeon: Lin Landsman, MD;  Location: [facility];  Service: Endoscopy;  Laterality: N/A;  priority 4   HERNIA REPAIR Bilateral    inguinal   REPAIR EXTENSOR TENDON Right 04/28/2020   Procedure: EXPLORATION OF RIGHT DORSAL HAND LACERATION WITH REPAIR OF INDEX EXTENSOR TENDON LACERATION;  Surgeon: Corky Mull, MD;  Location: [facility];  Service: Orthopedics;  Laterality: Right;   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   Procedure: 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.;  Surgeon: Corky Mull, MD;  Location: [facility];  Service: Orthopedics;  Laterality: Right;   RESECTION OF HAND NEUROMA Right 03/23/2021   Procedure: 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   Subjective Assessment - 05/12/21 1005     Subjective  I have no pain - done like you told me tapping with screwdriver and using 3 lbs weight - slight pull coming up at around 15-20 reps - wrist getting better - did bring small drill in today like you asked me    Pertinent History Pt had saw accident on 04/26/20 at [facility] and then Surgery by Dr Roland Rack on 04/28/20 - Primary repair of right EIP and index EDC tendons.2. Irrigation and debridement with primary repair of right dorsal hand laceration (approximately 10 cm in length). Pt was seen for OT and cont to had nerve pain -11/10/20 had traumatic Neuroma excision -NOW repeat evaluation status post a excision/reimplantation of traumatic neuroma involving the dorsal aspect of the right hand. Surgery was performed by Dr. Roland Rack on 03/23/2021. 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 The patient was doing well until he was hammering in some fence post and suffered a recurrence of his painful neuroma. The patient presents last week to surgeon off-  reporting no injury or trauma  affecting the right hand since his last surgery. He does report numbness and tingling especially in the second and third digits of the right hand. The patient take ibuprofen addition to occasional hydrocodone as needed for discomfort. Pain score today is a 5 out of 10- Refer to OT - pt to keep splint on until appt with OT this date - and to take off only for HEP and keep on untill follow up appt with surgeon 04/18/21    Patient Stated Goals Want to get the use of my R hand back to be able to work and take care of the animals and yard /garden at home    Currently in Pain? Yes    Pain  Score 1     Pain Location Hand    Pain Orientation Right    Pain Descriptors / Indicators Tender;Aching;Tightness    Pain Type Surgical pain    Pain Onset More than a month ago    Pain Frequency Intermittent                [facility] OT Assessment - 05/12/21 0001       Strength   Right Hand Grip (lbs) 70    Right Hand Lateral Pinch 30 lbs    Right Hand 3 Point Pinch 20 lbs    Left Hand Grip (lbs) 95    Left Hand Lateral Pinch 26 lbs    Left Hand 3 Point Pinch 19 lbs      Right Hand AROM   R Long  MCP 0-90 75 Degrees    R Long PIP 0-100 100 Degrees                Coming in pain free  this week -no pain with wrist AROM in all planes - and resistance - except supination 2/10  Fisting and opposition no pain  Compression glove on and off   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[employer], petroleum , making bed - about 3 lbs objects Pt wrist pain cont to be better weaning out of Benik wrist wrap - less than 25% But cont with Isotoner glove for compression over dorsal hand as needed  Done tapping screwdriver into foam or towel then firm putty 30 sec x 2 with glove - pain free 30 sec Small drill - 2nd and 3rd digit - not in composite fist - did not had any pain simulating drilling into firm putty - 3 x 30 sec        HEP for tendon glides , opposition , tapping of digits to cont after heat or contrast  Wrist AROM in all planes for warm up  Add and review with pt table slides for wrist extention - 20 reps - light and slight pull  Prior to 3 lbs for wrist extention 20 reps- compression glove on   3 lbs - with compression glove off did not had pain this date -sup/pro, RD , UD 20 reps -2 sets 2 x day -but keep pain 0-1/10  Change gripping into light blue putty - into cylinder-3 x 5 reps  pain free  3 point pinch into short cylinder putty - 4 x 5 - 20 reps - glove on pain free - 2 x day  Radial  N glide- was able to do 5 reps - all 3-4 steps  with no pull in  session  Can cont 2-3 x day - pain free -stop prior to pull  Keep  pain under 0-1/10               OT Education - 05/12/21 1007     Education Details progress and changes to HEP    Person(s) Educated Patient    Methods Explanation;Demonstration;Tactile cues;Verbal cues;Handout    Comprehension Verbal cues required;Returned demonstration;Verbalized understanding              OT Short Term Goals - 04/11/21 1605       OT SHORT TERM GOAL #1   Title Pt to be independent in HEP to tolerate desentitization of different textures/gentle scar massage and full extention of digits to donn gloves  with discomfort less than 2/10    Baseline at rest pain - burning 1-5/10 - numbness over dorsal 2nd and 3rd digits and in webspace- cannot toleate touching of dorsal hand over 2nd and 3rd MC's and incision    Time 4    Period Weeks    Status New    Target Date 05/09/21      OT SHORT TERM GOAL #2   Title R wrist AROM increase to WNL without increase symptoms    Baseline pain over dorsal hand -increase pull - increase with wrist flexion with gravity    Time 3    Period Weeks    Status New    Target Date 05/02/21               OT Long Term Goals - 04/11/21 1604       OT LONG TERM GOAL #1   Title Pt R hand digits flexion increase for pt to touch palm to grip and release IADLs objects with pain less than 2-3/10    Baseline in soft volar splint /cast - except with HEP, pain at rest 1-5/10 pain over dorsal hand    Time 5    Period Weeks    Status New    Target Date 05/16/21      OT LONG TERM GOAL #2   Title Grip strength increase to more than 50% compare to L to grip and pick up objects more than 5 lbs without symptoms    Baseline NT - 2 1/2 wkss/p - volar splint    Time 10    Period Weeks    Status New    Target Date 06/20/21      OT LONG TERM GOAL #3   Title Prehension strength increase to more than 50% compare to L to cut food, open packages, use in farm with animals     Baseline 2 1/2 wks s/p - volar splint    Time 8    Period Weeks    Status New    Target Date 06/06/21      OT LONG TERM GOAL #4   Title Function increas for pt to push ,pull door open, push up from chair, hammer nail , shake bags for feeding animals    Baseline 2 1/2 wks s/p    Time 10    Period Weeks    Status New    Target Date 06/20/21                   Plan - 05/12/21 1007     Clinical Impression Statement Pt is about 12 months  s/p right repair of 2nd digit EIP and EDC with irrigation and debridement.  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 This date no pain with AROM in wrist and digits. Numbness over dorsal hand and 2nd/3rd digits but pt report improving. Upgrade  this week to 3 lbs weight for all wrist motion -  felt pull with extention - add this date light table slides for wrist extention prior to wrist extention - able to do about 15 reps pain free. Pt to wear compression glove during HEP if pain free with it on. Increase gripping and 3 point pinch into light blue putty - pain free. Also able this date to use small drill and tapping with screwdriver into towel no issues- now tapping onto firm putty and use drill with screws into putty. - REINFORCE again as we upgrade strengthening now goal is to keep pain 0-1/10.  Keep HEP pain free , increase use in activities less than 3 lbs and wear glove if needed. Cont with moist heat/ contrast prior and  during HEP and activities to decrease pain if needed.  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    OT Occupational Profile and History Problem Focused Assessment - Including review of records relating to presenting problem    Occupational performance deficits (Please refer to evaluation for details): ADL's;IADL's;Rest and Sleep;Work;Play;Leisure;Social Participation    Body Structure / Function / Physical Skills ADL;Edema;Dexterity;Decreased knowledge of use of DME;Decreased knowledge of precautions;Flexibility;ROM;UE functional use;Scar mobility;Pain;Strength;IADL;Coordination    Rehab Potential Fair    Clinical Decision Making Limited treatment options, no task modification necessary    Comorbidities Affecting Occupational Performance: None    Modification or Assistance to Complete Evaluation  No modification of tasks or assist necessary to complete eval    OT Frequency 2x / week    OT Duration 8 weeks    OT Treatment/Interventions Self-care/ADL training;Contrast Bath;Fluidtherapy;Paraffin;Therapeutic exercise;Ultrasound;Manual Therapy;Passive range of motion;Scar mobilization;Splinting;Patient/family education;Therapeutic activities    Consulted and Agree with Plan of Care Patient             Patient will benefit from skilled therapeutic intervention in order to improve the following deficits and impairments:   Body Structure / Function / Physical Skills: ADL, Edema, Dexterity, Decreased knowledge of use of DME, Decreased knowledge of precautions, Flexibility, ROM, UE functional use, Scar mobility, Pain, Strength, IADL, Coordination       Visit Diagnosis: Pain in right hand  Stiffness of right wrist, not elsewhere classified  Scar condition and fibrosis  of skin  Stiffness of right hand, not elsewhere classified  Muscle weakness (generalized)    Problem List Patient Active Problem List   Diagnosis Date Noted   Toothache 02/20/2020   Headache 02/20/2020   Smoking 27/74/1287   Umbilical hernia, incarcerated    Back pain 03/27/2019   Benign  hypertension 05/02/2018   Obesity (BMI 30.0-34.9) 04/30/2018   Healthcare maintenance 10/19/2016   Flu vaccine need 04/20/2016   GERD (gastroesophageal reflux disease) 04/01/2015   Gout 04/01/2015   Mixed hyperlipidemia 10/01/2014   Hypothyroidism 05/21/2014    Rosalyn Gess, OTR/L,CLT 05/12/2021, 10:13 AM  [place] [facility] [address] [place], [address] Phone: [phone]   Fax:  [phone]  Name: Douglas Bird MRN: [id number] Date of Birth: Jun 21, 1965

## 2021-05-16 ENCOUNTER — Other Ambulatory Visit: Payer: Self-pay

## 2021-05-16 ENCOUNTER — Ambulatory Visit: Payer: PRIVATE HEALTH INSURANCE | Admitting: Occupational Therapy

## 2021-05-16 ENCOUNTER — Other Ambulatory Visit: Payer: Self-pay | Admitting: Gerontology

## 2021-05-16 DIAGNOSIS — L905 Scar conditions and fibrosis of skin: Secondary | ICD-10-CM

## 2021-05-16 DIAGNOSIS — M79641 Pain in right hand: Secondary | ICD-10-CM | POA: Diagnosis not present

## 2021-05-16 DIAGNOSIS — M25631 Stiffness of right wrist, not elsewhere classified: Secondary | ICD-10-CM

## 2021-05-16 DIAGNOSIS — M6281 Muscle weakness (generalized): Secondary | ICD-10-CM

## 2021-05-16 DIAGNOSIS — K219 Gastro-esophageal reflux disease without esophagitis: Secondary | ICD-10-CM

## 2021-05-16 DIAGNOSIS — M25641 Stiffness of right hand, not elsewhere classified: Secondary | ICD-10-CM

## 2021-05-16 NOTE — Therapy (Signed)
[place] [facility] [address] [place], [address] Phone: [phone]   Fax:  [phone]  Occupational Therapy Treatment/PN 10th visit  Patient Details  Name: Douglas Bird MRN: [id number] Date of Birth: 1966/03/13 Referring Provider (OT): Dr Roland Rack   Encounter Date: 05/16/2021   OT End of Session - 05/16/21 1407     Visit Number 10    Number of Visits 16    Date for OT Re-Evaluation 06/20/21    OT Start Time 0945    OT Stop Time 1020    OT Time Calculation (min) 35 min    Activity Tolerance Patient tolerated treatment well    Behavior During Therapy [facility] for tasks assessed/performed             Past Medical History:  Diagnosis Date   Diverticulitis 2011   GERD (gastroesophageal reflux disease)    Hyperlipidemia    Hypertension    controlled on meds   Hypothyroidism    Thyroid disease     Past Surgical History:  Procedure Laterality Date   CHOLECYSTECTOMY  June 2013   COLONOSCOPY WITH PROPOFOL N/A 04/28/2019   Procedure: COLONOSCOPY WITH PROPOFOL;  Surgeon: Lin Landsman, MD;  Location: [facility];  Service: Endoscopy;  Laterality: N/A;  priority 4   HERNIA REPAIR Bilateral    inguinal   REPAIR EXTENSOR TENDON Right 04/28/2020   Procedure: EXPLORATION OF RIGHT DORSAL HAND LACERATION WITH REPAIR OF INDEX EXTENSOR TENDON LACERATION;  Surgeon: Corky Mull, MD;  Location: [facility];  Service: Orthopedics;  Laterality: Right;   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   Procedure: 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.;  Surgeon: Corky Mull, MD;  Location: [facility];  Service: Orthopedics;  Laterality: Right;   RESECTION OF HAND NEUROMA Right 03/23/2021   Procedure: 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   Subjective Assessment - 05/16/21 1405     Subjective  I had increase pain since Friday evening - done my exericises but even the weight for my wrist hurt when up and down - wearing my glove - done hot and cold -it is about 4/10 - making fist and pinch -side of index finger knuckle on top of hand    Pertinent History Pt had saw accident on 04/26/20 at [facility] and then Surgery by Dr Roland Rack on 04/28/20 - Primary repair of right EIP and index EDC tendons.2. Irrigation and debridement with primary repair of right dorsal hand laceration (approximately 10 cm in length). Pt was seen for OT and cont to had nerve pain -11/10/20 had traumatic Neuroma excision -NOW repeat evaluation status post a excision/reimplantation of traumatic neuroma involving the dorsal aspect of the right hand. Surgery was performed by Dr. Roland Rack on 03/23/2021. 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 The patient was doing well until he was hammering in some fence post and suffered a recurrence of his painful neuroma. The patient presents last  week to surgeon off-  reporting no injury or trauma affecting the right hand since his last surgery. He does report numbness and tingling especially in the second and third digits of the right hand. The patient take ibuprofen addition to occasional hydrocodone as needed for discomfort. Pain score today is a 5 out of 10- Refer to OT - pt to keep splint on until appt with OT this date - and to take off only for HEP and keep on untill follow up appt with surgeon 04/18/21    Patient Stated Goals Want to get the use of my R hand back to be able to work and take care of the animals and yard  /garden at home    Currently in Pain? Yes    Pain Score 4     Pain Location Hand    Pain Orientation Right;Posterior    Pain Descriptors / Indicators Burning;Tender    Pain Type Surgical pain              Pt arrive with increase pain since about FRiday evening - pain 4/10 over radial 2nd MC towards dorsal hand - burning pain -increase with wrist ext , flexion slight pull  And composite fist  Pain increase with 3 point pinch   No increase tenderness over incision or in between 2nd and 3rd MC             OT Treatments/Exercises (OP) - 05/16/21 0001       RUE Fluidotherapy   Number Minutes Fluidotherapy 15 Minutes    RUE Fluidotherapy Location Hand;Wrist    Comments 3 cycle of ice - contrast -pain decrease to 1-2/10            contrast done more cycle in FLuido -pt pain decrease to 1-2/10  Fitted wit new compression glove to wear  Report feeling better  Pt to hold off on any of his HEP -to do only light tendon glides  And wrist AROM  But no strengthening or putty   Hold off on desensitization as well as scar massage until next session         OT Education - 05/16/21 1407     Education Details progress and changes to HEP    Person(s) Educated Patient    Methods Explanation;Demonstration;Tactile cues;Verbal cues;Handout    Comprehension Verbal cues required;Returned demonstration;Verbalized understanding              OT Short Term Goals - 04/11/21 1605       OT SHORT TERM GOAL #1   Title Pt to be independent in HEP to tolerate desentitization of different textures/gentle scar massage and full extention of digits to donn gloves  with discomfort less than 2/10    Baseline at rest pain - burning 1-5/10 - numbness over dorsal 2nd and 3rd digits and in webspace- cannot toleate touching of dorsal hand over 2nd and 3rd MC's and incision    Time 4    Period Weeks    Status New    Target Date 05/09/21      OT SHORT TERM GOAL #2   Title R wrist AROM  increase to WNL without increase symptoms    Baseline pain over dorsal hand -increase pull - increase with wrist flexion with gravity    Time 3    Period Weeks    Status New    Target Date 05/02/21               OT Long Term  Goals - 04/11/21 1604       OT LONG TERM GOAL #1   Title Pt R hand digits flexion increase for pt to touch palm to grip and release IADLs objects with pain less than 2-3/10    Baseline in soft volar splint /cast - except with HEP, pain at rest 1-5/10 pain over dorsal hand    Time 5    Period Weeks    Status New    Target Date 05/16/21      OT LONG TERM GOAL #2   Title Grip strength increase to more than 50% compare to L to grip and pick up objects more than 5 lbs without symptoms    Baseline NT - 2 1/2 wkss/p - volar splint    Time 10    Period Weeks    Status New    Target Date 06/20/21      OT LONG TERM GOAL #3   Title Prehension strength increase to more than 50% compare to L to cut food, open packages, use in farm with animals    Baseline 2 1/2 wks s/p - volar splint    Time 8    Period Weeks    Status New    Target Date 06/06/21      OT LONG TERM GOAL #4   Title Function increas for pt to push ,pull door open, push up from chair, hammer nail , shake bags for feeding animals    Baseline 2 1/2 wks s/p    Time 10    Period Weeks    Status New    Target Date 06/20/21                   Plan - 05/16/21 1409     Clinical Impression Statement Pt is about 12 months  s/p right repair of 2nd digit EIP and EDC with irrigation and debridement.  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 Had no pain with AROM in wrist and digits. Numbness over dorsal hand and 2nd/3rd digits but pt report improving. Upgrade start of last week to 3 lbs weight for all wrist motion -  felt pull with extention - add this date light table slides for wrist extention prior to wrist extention - able to do about 15 reps pain free. Pt to wear compression glove during HEP if pain free with it on. Increased gripping and 3 point pinch into ligh t easy blue putty the last visit- was pain free. But this date pt arrive with report of pain since the weekend - linger 4/10 pain but more on radial side of 2nd MC towards dorsal hand - done contrast today and pain decease to 05-Apr-2008 - pt to do same until next appt with this OT - decrease pain , wearing compression glove - but no  strengthening - only pain free AROM . 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    OT Occupational Profile and History Problem Focused Assessment - Including review of records relating to presenting problem    Occupational performance deficits (Please refer to evaluation for details): ADL's;IADL's;Rest and Sleep;Work;Play;Leisure;Social Participation    Body Structure / Function / Physical Skills ADL;Edema;Dexterity;Decreased knowledge of use of DME;Decreased knowledge of precautions;Flexibility;ROM;UE functional use;Scar mobility;Pain;Strength;IADL;Coordination    Rehab Potential Fair    Clinical Decision Making Limited treatment options, no task modification necessary    Comorbidities Affecting Occupational Performance: None    Modification or  Assistance to Complete Evaluation  No modification of tasks or assist necessary to complete eval    OT Frequency 2x / week    OT Duration 8 weeks    OT Treatment/Interventions Self-care/ADL training;Contrast Bath;Fluidtherapy;Paraffin;Therapeutic exercise;Ultrasound;Manual Therapy;Passive range of motion;Scar mobilization;Splinting;Patient/family education;Therapeutic activities    Consulted and Agree with Plan of Care Patient             Patient will benefit from skilled therapeutic intervention in order to improve the following deficits and impairments:   Body Structure / Function / Physical Skills: ADL, Edema, Dexterity, Decreased knowledge of use of DME, Decreased knowledge of precautions, Flexibility, ROM, UE functional use, Scar mobility, Pain, Strength, IADL, Coordination       Visit Diagnosis: Pain in right hand  Stiffness of right wrist, not elsewhere classified  Scar condition and fibrosis of skin  Stiffness of right hand, not elsewhere classified  Muscle weakness (generalized)    Problem List Patient Active Problem List   Diagnosis Date Noted   Toothache 02/20/2020   Headache 02/20/2020   Smoking 53/29/9242   Umbilical hernia, incarcerated    Back pain 03/27/2019   Benign hypertension 05/02/2018   Obesity (BMI 30.0-34.9) 04/30/2018   Healthcare maintenance 10/19/2016   Flu vaccine need 04/20/2016   GERD (gastroesophageal reflux disease) 04/01/2015   Gout 04/01/2015   Mixed hyperlipidemia 10/01/2014   Hypothyroidism 05/21/2014    Rosalyn Gess, OTR/L,CLT 05/16/2021, 2:14 PM  [place] [facility] [address], [place], [address] Phone: [phone]   Fax:  [phone]  Name: LEGRANDE HAO MRN: [id number] Date of Birth: 11-16-1965

## 2021-05-17 ENCOUNTER — Other Ambulatory Visit: Payer: Self-pay

## 2021-05-17 ENCOUNTER — Other Ambulatory Visit: Payer: Self-pay | Admitting: Gerontology

## 2021-05-17 DIAGNOSIS — K219 Gastro-esophageal reflux disease without esophagitis: Secondary | ICD-10-CM

## 2021-05-17 MED ORDER — OMEPRAZOLE 20 MG PO CPDR: 20.0000 mg | DELAYED_RELEASE_CAPSULE | Freq: Every day | ORAL | 1 refills | Status: DC

## 2021-05-17 MED ORDER — OMEPRAZOLE 20 MG PO CPDR: 20.0000 mg | DELAYED_RELEASE_CAPSULE | Freq: Every day | ORAL | 2 refills | Status: DC

## 2021-05-18 ENCOUNTER — Other Ambulatory Visit: Payer: Self-pay

## 2021-05-18 ENCOUNTER — Other Ambulatory Visit: Payer: Self-pay | Admitting: Gerontology

## 2021-05-18 DIAGNOSIS — K219 Gastro-esophageal reflux disease without esophagitis: Secondary | ICD-10-CM

## 2021-05-18 MED FILL — Omeprazole Cap Delayed Release 20 MG: ORAL | 90 days supply | Qty: 90 | Fill #0 | Status: AC

## 2021-05-19 ENCOUNTER — Other Ambulatory Visit: Payer: Self-pay

## 2021-05-19 ENCOUNTER — Ambulatory Visit: Payer: PRIVATE HEALTH INSURANCE | Attending: Student | Admitting: Occupational Therapy

## 2021-05-19 DIAGNOSIS — M25631 Stiffness of right wrist, not elsewhere classified: Secondary | ICD-10-CM | POA: Diagnosis present

## 2021-05-19 DIAGNOSIS — M25641 Stiffness of right hand, not elsewhere classified: Secondary | ICD-10-CM | POA: Diagnosis present

## 2021-05-19 DIAGNOSIS — L905 Scar conditions and fibrosis of skin: Secondary | ICD-10-CM | POA: Insufficient documentation

## 2021-05-19 DIAGNOSIS — M79641 Pain in right hand: Secondary | ICD-10-CM | POA: Insufficient documentation

## 2021-05-19 DIAGNOSIS — M6281 Muscle weakness (generalized): Secondary | ICD-10-CM | POA: Diagnosis present

## 2021-05-19 NOTE — Therapy (Signed)
[place] [facility] [address] [place], [address] Phone: [phone]   Fax:  [phone]  Occupational Therapy Treatment  Patient Details  Name: Douglas Bird MRN: [id number] Date of Birth: [birth year] Referring Provider (OT): Dr Roland Rack   Encounter Date: 05/19/2021   OT End of Session - 05/19/21 0822     Visit Number 11    Number of Visits 16    Date for OT Re-Evaluation 06/20/21    OT Start Time 0816    OT Stop Time 0858    OT Time Calculation (min) 42 min    Activity Tolerance Patient tolerated treatment well    Behavior During Therapy [facility] for tasks assessed/performed             Past Medical History:  Diagnosis Date   Diverticulitis 2011   GERD (gastroesophageal reflux disease)    Hyperlipidemia    Hypertension    controlled Bird meds   Hypothyroidism    Thyroid disease     Past Surgical History:  Procedure Laterality Date   CHOLECYSTECTOMY  June 2013   COLONOSCOPY WITH PROPOFOL N/A 04/28/2019   Procedure: COLONOSCOPY WITH PROPOFOL;  Surgeon: Lin Landsman, MD;  Location: [facility];  Service: Endoscopy;  Laterality: N/A;  priority 4   HERNIA REPAIR Bilateral    inguinal   REPAIR EXTENSOR TENDON Right 04/28/2020   Procedure: EXPLORATION OF RIGHT DORSAL HAND LACERATION WITH REPAIR OF INDEX EXTENSOR TENDON LACERATION;  Surgeon: Corky Mull, MD;  Location: [facility];  Service: Orthopedics;  Laterality: Right;   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   Procedure: 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.;  Surgeon: Corky Mull, MD;  Location: [facility];  Service: Orthopedics;  Laterality: Right;   RESECTION OF HAND NEUROMA Right 03/23/2021   Procedure: 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   Subjective Assessment - 05/19/21 0821     Subjective  Pain down again to 0/10, and done only the motion - hold off Bird weight and putty like you told me    Pertinent History Pt had saw accident Bird 04/26/20 at [facility] and then Surgery by Dr Roland Rack Bird 04/28/20 - Primary repair of right EIP and index EDC tendons.2. Irrigation and debridement with primary repair of right dorsal hand laceration (approximately 10 cm in length). Pt was seen for OT and cont to had nerve pain -11/10/20 had traumatic Neuroma excision -NOW repeat evaluation status post a excision/reimplantation of traumatic neuroma involving the dorsal aspect of the right hand. Surgery was performed by Dr. Roland Rack Bird 03/23/2021. 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 The patient was doing well until he was hammering in some fence post and suffered a recurrence of his painful neuroma. The patient presents last week to surgeon off-  reporting no injury or trauma affecting the right hand since his last surgery. He does report numbness and tingling especially in the second and third digits  of the right hand. The patient take ibuprofen addition to occasional hydrocodone as needed for discomfort. Pain score today is a 5 out of 10- Refer to OT - pt to keep splint Bird until appt with OT this date - and to take off only for HEP and keep Bird untill follow up appt with surgeon 04/18/21    Patient Stated Goals Want to get the use of my R hand back to be able to work and take care of the animals and yard /garden at home    Currently in Pain? No/denies                [facility] OT Assessment - 05/19/21 0001       AROM   Right Wrist Extension 58 Degrees     Right Wrist Flexion 90 Degrees      Strength   Right Hand Grip (lbs) 75    Right Hand Lateral Pinch 29 lbs    Right Hand 3 Point Pinch 22 lbs    Left Hand Grip (lbs) 95    Left Hand Lateral Pinch 26 lbs    Left Hand 3 Point Pinch 19 lbs      Right Hand AROM   R Index  MCP 0-90 80 Degrees    R Index PIP 0-100 95 Degrees    R Long  MCP 0-90 75 Degrees    R Long PIP 0-100 100 Degrees    R Ring PIP 0-100 80 Degrees    R Ring DIP 0-70 100 Degrees    R Little PIP 0-100 80 Degrees    R Little DIP 0-70 90 Degrees                Pain decrease back to 0/10 since 2 days ago    increase grip and prehension - and AROM in digis and wrist- except wrist ext   OT Treatments/Exercises (OP) - 05/19/21 0001       RUE Fluidotherapy   Number Minutes Fluidotherapy 10 Minutes    RUE Fluidotherapy Location Hand;Wrist    Comments 2 cycles of ice with AROM            Coming in pain free  -no pain with wrist AROM in all planes - and resistance  Fisting and opposition no pain  Compression glove Bird and off   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[employer], [employer] , making bed - about 3 lbs objects Pt wrist pain cont to be better weaning out of Benik wrist wrap - less than 25% But cont with Isotoner glove for compression over dorsal hand as needed  Can start back Sat tapping screwdriver into foam or towel then firm putty 30 sec x 2 with glove - pain free 30 sec- if pain free         HEP for tendon glides , opposition , tapping of digits to cont after heat or contrast  Wrist AROM in all planes for warm up  Add table slides for wrist extention - 20 reps - light and slight pull  Prior to 3 lbs  - only sup/pro, RD, UD  20 reps - pain free   3 lbs attempted ext  but pain - hold  - 20 reps -2 sets 2 x day -but keep pain 0-1/10  gripping into light blue putty -  but keep it ball - not cylinder 2  x 10-reps   Keep pain under 0-1/10  OT Education -  05/19/21 6578     Education Details progress and changes to HEP    Person(s) Educated Patient    Methods Explanation;Demonstration;Tactile cues;Verbal cues;Handout    Comprehension Verbal cues required;Returned demonstration;Verbalized understanding              OT Short Term Goals - 04/11/21 1605       OT SHORT TERM GOAL #1   Title Pt to be independent in HEP to tolerate desentitization of different textures/gentle scar massage and full extention of digits to donn gloves  with discomfort less than 2/10    Baseline at rest pain - burning 1-5/10 - numbness over dorsal 2nd and 3rd digits and in webspace- cannot toleate touching of dorsal hand over 2nd and 3rd MC's and incision    Time 4    Period Weeks    Status New    Target Date 05/09/21      OT SHORT TERM GOAL #2   Title R wrist AROM increase to WNL without increase symptoms    Baseline pain over dorsal hand -increase pull - increase with wrist flexion with gravity    Time 3    Period Weeks    Status New    Target Date 05/02/21               OT Long Term Goals - 04/11/21 1604       OT LONG TERM GOAL #1   Title Pt R hand digits flexion increase for pt to touch palm to grip and release IADLs objects with pain less than 2-3/10    Baseline in soft volar splint /cast - except with HEP, pain at rest 1-5/10 pain over dorsal hand    Time 5    Period Weeks    Status New    Target Date 05/16/21      OT LONG TERM GOAL #2   Title Grip strength increase to more than 50% compare to L to grip and pick up objects more than 5 lbs without symptoms    Baseline NT - 2 1/2 wkss/p - volar splint    Time 10    Period Weeks    Status New    Target Date 06/20/21      OT LONG TERM GOAL #3   Title Prehension strength increase to more than 50% compare to L to cut food, open packages, use in farm with animals    Baseline 2 1/2 wks s/p - volar splint    Time 8    Period Weeks    Status New    Target Date 06/06/21      OT LONG  TERM GOAL #4   Title Function increas for pt to push ,pull door open, push up from chair, hammer nail , shake bags for feeding animals    Baseline 2 1/2 wks s/p    Time 10    Period Weeks    Status New    Target Date 06/20/21                   Plan - 05/19/21 0836     Clinical Impression Statement Pt is about 12 months  s/p right repair of 2nd digit EIP and EDC with irrigation and debridement.  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 Had no pain with AROM in wrist and digits. Numbness over dorsal hand and 2nd/3rd digits but pt report improving. Upgrade start of last week to 3 lbs weight for all wrist motion -  felt pull with extention - add this date light table slides for wrist extention prior to wrist extention - able to do about 15 reps pain free. Pt to wear compression glove during HEP if pain free with it Bird. Increased gripping and 3 point pinch into ligh t easy blue putty the last visit- was pain free.  Pt pain decreas since 2 days ago back to 0/10 wiht fisting and wrist AROM - pt to start back with 3 lbs only for wrist RD, UD , sup /pro and light putty gripping but into ball 3 sec at time - no pinching or wrist ext/flexion with 3 lbs. Pt can start back in 48hrs if pain free 1 lbs for wrist ext , flexion, and  light tapping Bird soft services with screwdriver. 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    OT Occupational Profile and History Problem Focused Assessment - Including review of records relating to presenting problem    Occupational performance deficits (Please refer to evaluation for details): ADL's;IADL's;Rest and Sleep;Work;Play;Leisure;Social Participation    Body Structure / Function / Physical Skills ADL;Edema;Dexterity;Decreased knowledge of use of DME;Decreased knowledge of precautions;Flexibility;ROM;UE functional use;Scar mobility;Pain;Strength;IADL;Coordination    Rehab Potential Fair    Clinical Decision Making Limited treatment options, no task modification necessary    Comorbidities Affecting Occupational Performance: None    Modification or Assistance to Complete Evaluation  No modification of tasks or assist necessary to complete eval    OT Frequency 2x / week    OT Duration 8 weeks    OT Treatment/Interventions Self-care/ADL training;Contrast Bath;Fluidtherapy;Paraffin;Therapeutic exercise;Ultrasound;Manual Therapy;Passive range of motion;Scar mobilization;Splinting;Patient/family education;Therapeutic activities    Consulted and Agree with Plan of Care Patient             Patient will benefit from skilled therapeutic intervention in order to improve the following deficits and impairments:   Body Structure / Function / Physical Skills: ADL, Edema, Dexterity, Decreased knowledge of use of DME, Decreased knowledge of precautions, Flexibility, ROM, UE functional use, Scar mobility, Pain, Strength, IADL, Coordination       Visit Diagnosis: Pain in right hand  Stiffness of right wrist, not elsewhere classified  Scar condition and fibrosis of skin  Stiffness of right hand, not elsewhere classified  Muscle weakness (generalized)    Problem List Patient Active Problem List    Diagnosis Date Noted   Toothache 02/20/2020   Headache 02/20/2020   Smoking 23/53/6144   Umbilical hernia, incarcerated    Back pain 03/27/2019   Benign hypertension 05/02/2018   Obesity (BMI  30.0-34.9) 04/30/2018   Healthcare maintenance 10/19/2016   Flu vaccine need 04/20/2016   GERD (gastroesophageal reflux disease) 04/01/2015   Gout 04/01/2015   Mixed hyperlipidemia 10/01/2014   Hypothyroidism 05/21/2014    Rosalyn Gess, OTR/L,CLT 05/19/2021, 9:02 AM  [place] [facility] [address] [place], [address] Phone: [phone]   Fax:  [phone]  Name: Douglas Bird MRN: [id number] Date of Birth: [birth year]

## 2021-05-24 ENCOUNTER — Other Ambulatory Visit: Payer: Self-pay

## 2021-05-24 ENCOUNTER — Ambulatory Visit: Payer: PRIVATE HEALTH INSURANCE | Attending: Student | Admitting: Occupational Therapy

## 2021-05-24 DIAGNOSIS — M6281 Muscle weakness (generalized): Secondary | ICD-10-CM | POA: Insufficient documentation

## 2021-05-24 DIAGNOSIS — M79641 Pain in right hand: Secondary | ICD-10-CM | POA: Diagnosis present

## 2021-05-24 DIAGNOSIS — L905 Scar conditions and fibrosis of skin: Secondary | ICD-10-CM | POA: Insufficient documentation

## 2021-05-24 DIAGNOSIS — M25641 Stiffness of right hand, not elsewhere classified: Secondary | ICD-10-CM | POA: Insufficient documentation

## 2021-05-24 DIAGNOSIS — M25631 Stiffness of right wrist, not elsewhere classified: Secondary | ICD-10-CM

## 2021-05-24 NOTE — Therapy (Signed)
[facility] [address], [place], [address] Phone: [phone]   Fax:  [phone]  Occupational Therapy Treatment  Patient Details  Name: Douglas Bird MRN: [id number] Date of Birth: 11/28/1965 Referring Provider (OT): Dr Roland Rack   Encounter Date: 05/24/2021   OT End of Session - 05/24/21 0901     Visit Number 12    Number of Visits 16    Date for OT Re-Evaluation 06/20/21    OT Start Time 0901    OT Stop Time 0932    OT Time Calculation (min) 31 min    Activity Tolerance Patient tolerated treatment well    Behavior During Therapy [facility] for tasks assessed/performed             Past Medical History:  Diagnosis Date   Diverticulitis 2011   GERD (gastroesophageal reflux disease)    Hyperlipidemia    Hypertension    controlled on meds   Hypothyroidism    Thyroid disease     Past Surgical History:  Procedure Laterality Date   CHOLECYSTECTOMY  June 2013   COLONOSCOPY WITH PROPOFOL N/A 04/28/2019   Procedure: COLONOSCOPY WITH PROPOFOL;  Surgeon: Lin Landsman, MD;  Location: [place];  Service: Endoscopy;  Laterality: N/A;  priority 4   HERNIA REPAIR Bilateral    inguinal   REPAIR EXTENSOR TENDON Right 04/28/2020   Procedure: EXPLORATION OF RIGHT DORSAL HAND LACERATION WITH REPAIR OF INDEX EXTENSOR TENDON LACERATION;  Surgeon: Corky Mull, MD;  Location: [facility];  Service: Orthopedics;  Laterality: Right;   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   Procedure: 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.;  Surgeon: Corky Mull, MD;  Location: [facility];  Service: Orthopedics;  Laterality: Right;   RESECTION OF HAND NEUROMA Right 03/23/2021   Procedure: 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   Subjective Assessment - 05/24/21 0901     Subjective  I feel so bad with this allergies- my hand doing okay - yesterday I tried to screw something in the wall - light switch and it hurt some and pinching to pull blind    Pertinent History Pt had saw accident on 04/26/20 at [facility] and then Surgery by Dr Roland Rack on 04/28/20 - Primary repair of right EIP and index EDC tendons.2. Irrigation and debridement with primary repair of right dorsal hand laceration (approximately 10 cm in length). Pt was seen for OT and cont to had nerve pain -11/10/20 had traumatic Neuroma excision -NOW repeat evaluation status post a excision/reimplantation of traumatic neuroma involving the dorsal aspect of the right hand. Surgery was performed by Dr. Roland Rack on 03/23/2021. 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 The patient was doing well until he was hammering in some fence post and suffered a recurrence of his painful neuroma. The patient presents last week to surgeon off-  reporting no injury or trauma affecting the right hand since his last surgery. He  does report numbness and tingling especially in the second and third digits of the right hand. The patient take ibuprofen addition to occasional hydrocodone as needed for discomfort. Pain score today is a 5 out of 10- Refer to OT - pt to keep splint on until appt with OT this date - and to take off only for HEP and keep on untill follow up appt with surgeon 04/18/21    Patient Stated Goals Want to get the use of my R hand back to be able to work and take care of the animals and yard /garden at home    Currently in Pain? 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[employer], petroleum , making bed - about 3 lbs objects Pt wrist pain cont to be better weaning out of Benik wrist wrap - less than 25% But cont with Isotoner glove for compression over dorsal hand as needed   tapping screwdriver  and heavier wrench - into foam or towel 30 sec x 2 with glove pain free  Holding it narrow and wide          HEP for tendon glides , opposition  Wrist AROM in all planes for warm up  Cont table slides for wrist extention - 20 reps - light and slight pull  Cont 3 lbs  sup/pro, RD, UD  20 reps - pain free   20 reps -2 sets 2 x day -but keep pain 0-1/10  Add 2 lbs for wrist ext/ flexion 15 reps  gripping into light blue putty -  but keep it ball - 2  x 10-reps   Add 3 point pinch but with twisting away and towards him 12 -15 reps Keep pain under 0-1/10   Problem areas for patient at the moment Patient appeared to be more irritated when doing a composite tight fist with second and third digit Resistance abduction and abduction of second and third digits And three-point pinch with resistance or repetition              OT Education - 05/24/21 0901     Education Details progress and changes to HEP    Person(s) Educated Patient    Methods Explanation;Demonstration;Tactile cues;Verbal cues;Handout    Comprehension Verbal cues required;Returned demonstration;Verbalized understanding              OT Short Term Goals - 04/11/21 1605       OT SHORT TERM GOAL #1   Title Pt to be independent in  HEP to tolerate desentitization of different textures/gentle scar massage and full extention of digits to donn gloves  with discomfort less than 2/10    Baseline at rest pain - burning 1-5/10 - numbness over dorsal 2nd and 3rd digits and in webspace-  cannot toleate touching of dorsal hand over 2nd and 3rd MC's and incision    Time 4    Period Weeks    Status New    Target Date 05/09/21      OT SHORT TERM GOAL #2   Title R wrist AROM increase to WNL without increase symptoms    Baseline pain over dorsal hand -increase pull - increase with wrist flexion with gravity    Time 3    Period Weeks    Status New    Target Date 05/02/21               OT Long Term Goals - 04/11/21 1604       OT LONG TERM GOAL #1   Title Pt R hand digits flexion increase for pt to touch palm to grip and release IADLs objects with pain less than 2-3/10    Baseline in soft volar splint /cast - except with HEP, pain at rest 1-5/10 pain over dorsal hand    Time 5    Period Weeks    Status New    Target Date 05/16/21      OT LONG TERM GOAL #2   Title Grip strength increase to more than 50% compare to L to grip and pick up objects more than 5 lbs without symptoms    Baseline NT - 2 1/2 wkss/p - volar splint    Time 10    Period Weeks    Status New    Target Date 06/20/21      OT LONG TERM GOAL #3   Title Prehension strength increase to more than 50% compare to L to cut food, open packages, use in farm with animals    Baseline 2 1/2 wks s/p - volar splint    Time 8    Period Weeks    Status New    Target Date 06/06/21      OT LONG TERM GOAL #4   Title Function increas for pt to push ,pull door open, push up from chair, hammer nail , shake bags for feeding animals    Baseline 2 1/2 wks s/p    Time 10    Period Weeks    Status New    Target Date 06/20/21                   Plan - 05/24/21 0901     Clinical Impression Statement Pt is about 12 months  s/p right repair of 2nd digit EIP  and EDC with irrigation and debridement.  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  No pain with AROM in wrist and digits.  Feel light burn when making tight fist and ABD/ADD of 2nd and 3rd digit, and 3 point pinch more than gripping. Cont table slides for wrist extention prior to wrist extention 2 lbs- and cont 3 lbs for RD, UD, sup /  pro -pain free.  Pt to wear compression glove during HEP if pain free with it on. COnt gripping in ligh t easy blue putty and add this date 3 point pinch twisting away and towards him -stop when feeling pull. Cont with screwdriver and heavier wrench tapping on soft surfaces. 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    OT Occupational Profile and History Problem Focused Assessment - Including review of records relating to presenting problem    Occupational performance deficits (Please refer to evaluation for details): ADL's;IADL's;Rest and Sleep;Work;Play;Leisure;Social Participation    Body Structure / Function / Physical Skills ADL;Edema;Dexterity;Decreased knowledge of use of DME;Decreased knowledge of precautions;Flexibility;ROM;UE functional use;Scar mobility;Pain;Strength;IADL;Coordination    Rehab Potential Fair    Clinical Decision Making Limited treatment options, no task modification necessary     Comorbidities Affecting Occupational Performance: None    Modification or Assistance to Complete Evaluation  No modification of tasks or assist necessary to complete eval    OT Frequency 2x / week    OT Duration 8 weeks    OT Treatment/Interventions Self-care/ADL training;Contrast Bath;Fluidtherapy;Paraffin;Therapeutic exercise;Ultrasound;Manual Therapy;Passive range of motion;Scar mobilization;Splinting;Patient/family education;Therapeutic activities    Consulted and Agree with Plan of Care Patient             Patient will benefit from skilled therapeutic intervention in order to improve the following deficits and impairments:   Body Structure / Function / Physical Skills: ADL, Edema, Dexterity, Decreased knowledge of use of DME, Decreased knowledge of precautions, Flexibility, ROM, UE functional use, Scar mobility, Pain, Strength, IADL, Coordination       Visit Diagnosis: Pain in right hand  Scar condition and fibrosis of skin  Stiffness of right hand, not elsewhere classified  Muscle weakness (generalized)  Stiffness of right wrist, not elsewhere classified    Problem List Patient Active Problem List   Diagnosis Date Noted   Toothache 02/20/2020   Headache 02/20/2020   Smoking 40/81/4481   Umbilical hernia, incarcerated    Back pain 03/27/2019   Benign hypertension 05/02/2018   Obesity (BMI 30.0-34.9) 04/30/2018   Healthcare maintenance 10/19/2016   Flu vaccine need 04/20/2016   GERD (gastroesophageal reflux disease) 04/01/2015   Gout 04/01/2015   Mixed hyperlipidemia 10/01/2014   Hypothyroidism 05/21/2014    Douglas Bird, OTR/L,CLT 05/24/2021, 9:38 AM  [facility] [address] [place], [address] Phone: [phone]   Fax:  [phone]  Name: Douglas Bird MRN: [id number] Date of Birth: 1965-10-22

## 2021-05-26 ENCOUNTER — Other Ambulatory Visit: Payer: Self-pay

## 2021-05-26 ENCOUNTER — Ambulatory Visit: Payer: PRIVATE HEALTH INSURANCE | Attending: Student | Admitting: Occupational Therapy

## 2021-05-26 DIAGNOSIS — L905 Scar conditions and fibrosis of skin: Secondary | ICD-10-CM

## 2021-05-26 DIAGNOSIS — M79641 Pain in right hand: Secondary | ICD-10-CM

## 2021-05-26 DIAGNOSIS — M25631 Stiffness of right wrist, not elsewhere classified: Secondary | ICD-10-CM | POA: Insufficient documentation

## 2021-05-26 DIAGNOSIS — M25641 Stiffness of right hand, not elsewhere classified: Secondary | ICD-10-CM

## 2021-05-26 DIAGNOSIS — E039 Hypothyroidism, unspecified: Secondary | ICD-10-CM

## 2021-05-26 DIAGNOSIS — M6281 Muscle weakness (generalized): Secondary | ICD-10-CM

## 2021-05-26 NOTE — Therapy (Signed)
[facility] [address] [place], [address] Phone: [phone]   Fax:  [phone]  Occupational Therapy Treatment  Patient Details  Name: Douglas Bird MRN: [id number] Date of Birth: [birth year] Referring Provider (OT): Dr Roland Rack   Encounter Date: 05/26/2021   OT End of Session - 05/26/21 1442     Visit Number 13    Number of Visits 16    Date for OT Re-Evaluation 06/20/21    OT Start Time 0946    OT Stop Time 1025    OT Time Calculation (min) 39 min    Activity Tolerance Patient tolerated treatment well    Behavior During Therapy [facility] for tasks assessed/performed             Past Medical History:  Diagnosis Date   Diverticulitis 2011   GERD (gastroesophageal reflux disease)    Hyperlipidemia    Hypertension    controlled on meds   Hypothyroidism    Thyroid disease     Past Surgical History:  Procedure Laterality Date   CHOLECYSTECTOMY  June 2013   COLONOSCOPY WITH PROPOFOL N/A 04/28/2019   Procedure: COLONOSCOPY WITH PROPOFOL;  Surgeon: Lin Landsman, MD;  Location: [place];  Service: Endoscopy;  Laterality: N/A;  priority 4   HERNIA REPAIR Bilateral    inguinal   REPAIR EXTENSOR TENDON Right 04/28/2020   Procedure: EXPLORATION OF RIGHT DORSAL HAND LACERATION WITH REPAIR OF INDEX EXTENSOR TENDON LACERATION;  Surgeon: Corky Mull, MD;  Location: [facility];  Service: Orthopedics;  Laterality: Right;   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   Procedure: 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.;  Surgeon: Corky Mull, MD;  Location: [facility];  Service: Orthopedics;  Laterality: Right;   RESECTION OF HAND NEUROMA Right 03/23/2021   Procedure: 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   Subjective Assessment - 05/26/21 1441     Subjective  No pain - doing okay with putty , weight and doing my exercises-tapping with a screwdriver was okay tapping with the range I could feel little bit    Pertinent History Pt had saw accident on 04/26/20 at [facility] and then Surgery by Dr Roland Rack on 04/28/20 - Primary repair of right EIP and index EDC tendons.2. Irrigation and debridement with primary repair of right dorsal hand laceration (approximately 10 cm in length). Pt was seen for OT and cont to had nerve pain -11/10/20 had traumatic Neuroma excision -NOW repeat evaluation status post a excision/reimplantation of traumatic neuroma involving the dorsal aspect of the right hand. Surgery was performed by Dr. Roland Rack on 03/23/2021. 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 The patient was doing well until he was hammering in some fence post and suffered a recurrence of his painful neuroma. The patient presents last week to surgeon off-  reporting no injury or trauma affecting the right hand since his last surgery. He does report numbness and tingling especially  in the second and third digits of the right hand. The patient take ibuprofen addition to occasional hydrocodone as needed for discomfort. Pain score today is a 5 out of 10- Refer to OT - pt to keep splint on until appt with OT this date - and to take off only for HEP and keep on untill follow up appt with surgeon 04/18/21    Patient Stated Goals Want to get the use of my R hand back to be able to work and take care of the animals and yard /garden at home    Currently in Pain? 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[employer], petroleum , making bed - about 3 lbs objects Cont with Isotoner glove for compression over dorsal hand as needed  Pt can start riding lawn mower -but no pounding , hitting, pulling , gripping forceful - vibration tools   tapping heavier wrench - into foam or towel 30 sec x 2 with glove pain free  Holding it narrow and wide          HEP for tendon glides , opposition  and Wrist AROM in all planes for warm up  Cont table slides for wrist extention - 20 reps - light and slight pull  Cont 3 lbs  sup/pro, RD, UD  20 reps - pain free   20 reps -2 sets 2 x day -but keep pain 0-1/10   2 lbs for wrist ext/ flexion 15 reps  gripping  upgrade to med teal putty  -  but keep it ball - 2  x 10-reps   3 point pinch but with twisting away and towards him 12 -15 reps Keep pain under 0-1/10  And 2 point pinch pulling small objects out of putty  Add RTB for pulling , and lifting into retraction and elbow flexion- simulating lifting hay  Pulling lightly with 3 point pinch band - into retraction - wrist straight  12-15 reps 2 x day    Problem areas for patient at the moment Patient appeared to be more irritated when doing a composite tight fist with second and third digit with wrist into flexion Resistance abduction and abduction of second and third digits And three-point pinch with resistance or repetition  Used screwdriver to turn screw into wood -and use drill - able to do but some discomfort when had to grip and twist with pressure  And when drill -kicked back from wood when screw was in  Pt to to put screw  into hard putty - using manual screw driver  And drill into wood but not when kick back - just in between                    OT Education - 05/26/21 1442     Education Details progress and changes to HEP    Person(s) Educated Patient    Methods Explanation;Demonstration;Tactile cues;Verbal  cues;Handout    Comprehension Verbal cues required;Returned demonstration;Verbalized understanding              OT Short Term Goals - 04/11/21 1605       OT SHORT TERM GOAL #1   Title Pt to be independent in HEP to tolerate desentitization of different textures/gentle scar massage and full extention of digits to donn gloves  with discomfort less than 2/10    Baseline at rest pain - burning 1-5/10 - numbness over dorsal 2nd and 3rd digits and in webspace- cannot toleate touching of dorsal hand over 2nd and 3rd MC's and incision    Time 4    Period Weeks    Status New    Target Date 05/09/21      OT SHORT TERM GOAL #2   Title R wrist AROM increase to WNL without increase symptoms    Baseline pain over dorsal hand -increase pull - increase with wrist flexion with gravity    Time 3    Period Weeks    Status New    Target Date 05/02/21               OT Long Term Goals - 04/11/21 1604       OT LONG TERM GOAL #1   Title Pt R hand digits flexion increase for pt to touch palm to grip and release IADLs objects with pain less than 2-3/10    Baseline in soft volar splint /cast - except with HEP, pain at rest 1-5/10 pain over dorsal hand    Time 5    Period Weeks    Status New    Target Date 05/16/21      OT LONG TERM GOAL #2   Title Grip strength increase to more than 50% compare to L to grip and pick up objects more than 5 lbs without symptoms    Baseline NT - 2 1/2 wkss/p - volar splint    Time 10    Period Weeks    Status New    Target Date 06/20/21      OT LONG TERM GOAL #3   Title Prehension strength increase to more than 50% compare to L to cut food, open  packages, use in farm with animals    Baseline 2 1/2 wks s/p - volar splint    Time 8    Period Weeks    Status New    Target Date 06/06/21      OT LONG TERM GOAL #4   Title Function increas for pt to push ,pull door open, push up from chair, hammer nail , shake bags for feeding animals    Baseline 2 1/2 wks s/p    Time 10    Period Weeks    Status New    Target Date 06/20/21                   Plan - 05/26/21 1443     Clinical Impression Statement Pt is about 12 months  s/p right repair of 2nd digit EIP and EDC with irrigation and debridement.  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  No pain with AROM in wrist and digits.  Feel light burn when making tight fist with wrist flexion and ABD/ADD of 2nd and 3rd digit, and 3 point pinch more than gripping. Tolerating 2 lbs for wrist ext, flexion and  3 lbs for RD, UD, sup /pro -pain free.  Pt to wear compression glove during HEP if pain free with it on. Upgrade to teal putty for gripping and  3 point pinch twisting away and towards him and 2 point pinch -stop when feeling pull. Used drill for screws into wood- tolerate but not when kick back at end - and add and done RTB for simulating pulling and lifting hay. Cont with tapping heavier wrench on soft surfaces. 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    OT Occupational  Profile and History Problem Focused Assessment - Including review of records relating to presenting problem    Occupational performance deficits (Please refer to evaluation for details): ADL's;IADL's;Rest and Sleep;Work;Play;Leisure;Social Participation    Body Structure / Function / Physical Skills ADL;Edema;Dexterity;Decreased knowledge of use of DME;Decreased knowledge of precautions;Flexibility;ROM;UE functional use;Scar mobility;Pain;Strength;IADL;Coordination    Rehab Potential Fair    Clinical Decision Making Limited treatment options, no task modification necessary    Comorbidities Affecting Occupational Performance: None    Modification or Assistance to Complete Evaluation  No modification of tasks or assist necessary to complete eval    OT Frequency 2x / week    OT Duration 8 weeks    OT Treatment/Interventions Self-care/ADL training;Contrast Bath;Fluidtherapy;Paraffin;Therapeutic exercise;Ultrasound;Manual Therapy;Passive range of motion;Scar mobilization;Splinting;Patient/family education;Therapeutic activities    Consulted and Agree with Plan of Care Patient             Patient will benefit from skilled therapeutic intervention in order to improve the following deficits and impairments:   Body Structure / Function / Physical Skills: ADL, Edema, Dexterity, Decreased knowledge of use of DME, Decreased knowledge of precautions, Flexibility, ROM, UE functional use, Scar mobility, Pain, Strength, IADL, Coordination       Visit Diagnosis: Pain in right hand  Scar condition and fibrosis of skin  Stiffness of right hand, not elsewhere classified  Muscle weakness (generalized)  Stiffness of right wrist, not elsewhere classified    Problem List Patient Active Problem List   Diagnosis Date Noted   Toothache 02/20/2020   Headache 02/20/2020   Smoking 67/59/1638   Umbilical hernia, incarcerated    Back pain 03/27/2019   Benign hypertension 05/02/2018   Obesity (BMI  30.0-34.9) 04/30/2018   Healthcare maintenance 10/19/2016   Flu vaccine need 04/20/2016   GERD (gastroesophageal reflux disease) 04/01/2015   Gout 04/01/2015   Mixed hyperlipidemia 10/01/2014   Hypothyroidism 05/21/2014    Rosalyn Gess, OTR/L,CLT 05/26/2021, 3:29 PM  [place] [facility] [address] [place], [address] Phone: [phone]   Fax:  [phone]  Name: Douglas Bird MRN: [id number] Date of Birth: 10-Feb-1966

## 2021-05-27 LAB — TSH: TSH: 8.03 u[IU]/mL — ABNORMAL HIGH (ref 0.450–4.500)

## 2021-05-31 ENCOUNTER — Ambulatory Visit: Payer: PRIVATE HEALTH INSURANCE | Admitting: Occupational Therapy

## 2021-05-31 ENCOUNTER — Other Ambulatory Visit: Payer: Self-pay

## 2021-05-31 DIAGNOSIS — M6281 Muscle weakness (generalized): Secondary | ICD-10-CM

## 2021-05-31 DIAGNOSIS — M25631 Stiffness of right wrist, not elsewhere classified: Secondary | ICD-10-CM

## 2021-05-31 DIAGNOSIS — M79641 Pain in right hand: Secondary | ICD-10-CM

## 2021-05-31 DIAGNOSIS — L905 Scar conditions and fibrosis of skin: Secondary | ICD-10-CM

## 2021-05-31 DIAGNOSIS — M25641 Stiffness of right hand, not elsewhere classified: Secondary | ICD-10-CM

## 2021-05-31 NOTE — Therapy (Signed)
[facility] ?[address] ?[place], [place], [address] ?Phone: [phone]   Fax:  [phone] ? ?Occupational Therapy Treatment ? ?Patient Details  ?Name: Douglas Bird ?MRN: [id number] ?Date of Birth: 05/23/1965 ?Referring Provider (OT): Dr Roland Rack ? ? ?Encounter Date: 05/31/2021 ? ? OT End of Session - 05/31/21 1206   ? ? Visit Number 14   ? Number of Visits 16   ? Date for OT Re-Evaluation 06/20/21   ? OT Start Time 1205   ? OT Stop Time 1246   ? OT Time Calculation (min) 41 min   ? Activity Tolerance Patient tolerated treatment well   ? Behavior During Therapy [facility] for tasks assessed/performed   ? ?  ?  ? ?  ? ? ?Past Medical History:  ?Diagnosis Date  ? Diverticulitis 2011  ? GERD (gastroesophageal reflux disease)   ? Hyperlipidemia   ? Hypertension   ? controlled on meds  ? Hypothyroidism   ? Thyroid disease   ? ? ?Past Surgical History:  ?Procedure Laterality Date  ? CHOLECYSTECTOMY  June 2013  ? COLONOSCOPY WITH PROPOFOL N/A 04/28/2019  ? Procedure: COLONOSCOPY WITH PROPOFOL;  Surgeon: Lin Landsman, MD;  Location: [place] [facility];  Service: Endoscopy;  Laterality: N/A;  priority 4  ? HERNIA REPAIR Bilateral   ? inguinal  ? REPAIR EXTENSOR TENDON Right 04/28/2020  ? Procedure: EXPLORATION OF RIGHT DORSAL HAND LACERATION WITH REPAIR OF INDEX EXTENSOR TENDON LACERATION;  Surgeon: Corky Mull, MD;  Location: [facility];  Service: Orthopedics;  Laterality: Right;  ? 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  ? 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 RESECTION OF HAND NEUROMA Right 03/23/2021  ? 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 UMBILICAL HERNIA REPAIR N/A 08/11/2019  ? Procedure: HERNIA REPAIR UMBILICAL ADULT;  Surgeon: Fredirick Maudlin, MD;  Location: [facility];  Service: General;  Laterality: N/A;  RNFA  ? ? ?There were no vitals filed for this visit. ? ? Subjective Assessment - 05/31/21 1206   ? ? Subjective  I am done okay with the exercises but this cold weather from Sunday my nerve definitely do not like ice, Dr. Roland Rack  told me that.  Due to okay with my exercises except the pulling of red Thera-Band with three-point pinch   ? Pertinent History Pt had saw accident on 04/26/20 at [facility] and then Surgery by Dr Roland Rack on 04/28/20 - Primary repair of right EIP and index EDC tendons.2. Irrigation and debridement with primary repair of right dorsal hand laceration (approximately 10 cm in length). Pt was seen for OT and cont to had nerve pain -11/10/20 had traumatic Neuroma excision -NOW repeat evaluation status post a excision/reimplantation of traumatic neuroma involving the dorsal aspect of the right hand. Surgery was performed by Dr. Roland Rack on 03/23/2021. 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 The patient was doing well until he was hammering in some fence post and suffered a recurrence of his painful neuroma. The patient presents last week to surgeon off-  reporting no injury or trauma  affecting the right hand since his last surgery. He does report numbness and tingling especially in the second and third digits of the right hand. The patient take ibuprofen addition to occasional hydrocodone as needed for discomfort. Pain score today is a 5 out of 10- Refer to OT - pt to keep splint on until appt with OT this date - and to take off only for HEP and keep on untill follow up appt with surgeon 04/18/21   ? Patient Stated Goals Want to get the use of my R hand back to be able to work and take care of the animals and yard /garden at home   ? Currently in  Pain? Yes   ? Pain Score 1    ? Pain Location Hand   ? Pain Orientation Right;Posterior   ? Pain Descriptors / Indicators Tender;Burning   ? Pain Type Surgical pain   ? Pain Onset More than a month ago   ? ?  ?  ? ?  ? ? ? ? ? ? ? ? ? ? ? ? ? ? ? OT Treatments/Exercises (OP) - 05/31/21 0001   ? ?  ? Moist Heat Therapy  ? Number Minutes Moist Heat 6 Minutes   ? Moist Heat Location Hand   prior to ROM and decrease tightness  ? ?  ?  ? ?  ? ?Coming in pain free  -no pain with wrist AROM in all planes - and resistance  ?Fisting and opposition no pain - tight fist with 2nd and 3rd digit - pull with light  burn  and with wrist into flexion ?With Compression glove on and off ?Cont to feel most of symptoms with 3 point pinch tight with pull t on object - pain 2/10  ?  ?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[employer], [employer] , making bed - about 3 lbs objects ?Cont with Isotoner glove for compression over dorsal hand as needed  ?Pt can start riding lawn mower -but no pounding , hitting, pulling , gripping forceful - vibration tools ?  ? tapping heavier wrench - into foam or towel 30 sec x 2 with glove pain free  ?Holding it narrow and wide  ?  ?  ?  ?Cont table slides for wrist extention - 20 reps - light and slight pull  ?Cont 3 lbs  sup/pro, RD, UD  20 reps - pain free  ? 20 reps -2 sets ?2 x day -but keep pain 0-1/10  ? 2 lbs for wrist ext/ flexion 15 reps  ?gripping  upgrade to med teal putty  -  but keep it ball - 2  x 10-reps   ?3 point pinch but with twisting away and towards him 12 -15 reps ?Keep pain under 0-1/10  ?And 2 point pinch pulling small objects out of putty ?  ?Upgrade to GTB for pulling , and lifting into retraction and elbow flexion- simulating lifting hay  ? HOLD off on Pulling lightly with 3 point pinch band - into retraction - wrist straight ( HAD pain) ?12-15 reps ?2 x day  ?  ?Problem areas for patient at the moment ?Patient appeared to be more irritated when doing a composite  tight fist with second and third digit with wrist into flexion ?Resistance abduction and abduction of second and third digits ?And three-point pinch with resistance or repetition ?  ?Used screwdriver to turn screw into wood -and use drill - able to do  but some discomfort when had to grip and twist with pressure  ?And when drill -kicked back from wood when screw was in  ?Pt to to put screw into hard putty - using manual screw driver  ?And drill into wood but not when kick back - just in between  ?[employer] able to do 10 lbs pull, push and lift -but 15 lbs increase symptoms ? Stabilization ball on wall with palm and digits , wrist and elbow extention - had increase pain and burning with digits extention with wrist and elbow if longer than 45 sec  ?Throw and catch weighted ball on rebounder - able to do 30-45 sec ? Pt to do basketball at home - on wall and dribbling and shooting - 30-45 sec at time  ?Assess how many sets or reps able to do without symptoms  ?  ?  ?  ?  ?  ?  ? ? ? ? ? ? ? OT Education - 05/31/21 1206   ? ? Education Details progress and changes to HEP   ? Person(s) Educated Patient   ? Methods Explanation;Demonstration;Tactile cues;Verbal cues;Handout   ? Comprehension Verbal cues required;Returned demonstration;Verbalized understanding   ? ?  ?  ? ?  ? ? ? OT Short Term Goals - 04/11/21 1605   ? ?  ? OT SHORT TERM GOAL #1  ? Title Pt to be independent in HEP to tolerate desentitization of different textures/gentle scar massage and full extention of digits to donn gloves  with discomfort less than 2/10   ? Baseline at rest pain - burning 1-5/10 - numbness over dorsal 2nd and 3rd digits and in webspace- cannot toleate touching of dorsal hand over 2nd and 3rd MC's and incision   ? Time 4   ? Period Weeks   ? Status New   ? Target Date 05/09/21   ?  ? OT SHORT TERM GOAL #2  ? Title R wrist AROM increase to WNL without increase symptoms   ? Baseline pain over dorsal hand -increase pull - increase with wrist  flexion with gravity   ? Time 3   ? Period Weeks   ? Status New   ? Target Date 05/02/21   ? ?  ?  ? ?  ? ? ? ? OT Long Term Goals - 04/11/21 1604   ? ?  ? OT LONG TERM GOAL #1  ? Title Pt R hand digits flexion

## 2021-06-02 ENCOUNTER — Other Ambulatory Visit: Payer: Self-pay

## 2021-06-02 ENCOUNTER — Ambulatory Visit: Payer: Self-pay | Admitting: Gerontology

## 2021-06-02 VITALS — BP 149/96 | HR 83 | Temp 98.3°F | Resp 17 | Ht 69.0 in | Wt 233.7 lb

## 2021-06-02 MED ORDER — ATORVASTATIN CALCIUM 40 MG PO TABS: 40.0000 mg | ORAL_TABLET | Freq: Every evening | ORAL | 1 refills | Status: DC

## 2021-06-02 MED ORDER — LEVOTHYROXINE SODIUM 150 MCG PO TABS
ORAL_TABLET | Freq: Every day | ORAL | 0 refills | Status: DC
Start: 2021-06-02 — End: 2021-09-01
  Filled 2021-06-02: qty 90, fill #0
  Filled 2021-07-27: qty 90, 90d supply, fill #0

## 2021-06-02 MED ORDER — LISINOPRIL 20 MG PO TABS: 20.0000 mg | ORAL_TABLET | Freq: Every day | ORAL | 1 refills | Status: DC

## 2021-06-02 NOTE — Progress Notes (Signed)
? ?Established Patient Office Visit ? ?Subjective:  ?Patient ID: Douglas Bird, male    DOB: Aug 10, 1965  Age: 56 y.o. MRN: [id number] ? ?CC:  ?Chief Complaint  ?Patient presents with  ? Follow-up  ?  Labs drawn 05/26/21  ? ? ?HPI ?Douglas Bird   is a 56 year old male who has history of diverticulitis, GERD, hyperlipidemia, hypertension, hypothyroidism, presents for routine follow-up visit , lab review and medication refill. His TSH done on 05/26/21 decreased from 9.130 to 8.030, he denies cold/heat intolerance, constipation. He was seen by the Urologist Dr Bonnita Hollow on 04/26/21 for ED. 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 He states that he is compliant with his medications, denies side effects and continues to make healthy lifestyle changes. His blood pressure was elevated at 149/96, denies chest pain, headache, dizziness and vision changes. Overall, he states that he's doing well and offers no further complaint. ? ?Past Medical History:  ?Diagnosis Date  ? Diverticulitis 2011  ? GERD (gastroesophageal reflux disease)   ? Hyperlipidemia   ? Hypertension   ? controlled on meds  ? Hypothyroidism   ? Thyroid disease   ? ? ?Past Surgical History:  ?Procedure Laterality Date  ? CHOLECYSTECTOMY  June 2013  ? COLONOSCOPY WITH PROPOFOL N/A 04/28/2019  ? Procedure: COLONOSCOPY WITH PROPOFOL;  Surgeon: Lin Landsman, MD;  Location: [facility];  Service: Endoscopy;  Laterality: N/A;  priority 4  ? HERNIA REPAIR Bilateral   ? inguinal  ? REPAIR EXTENSOR TENDON Right 04/28/2020  ? Procedure: EXPLORATION OF RIGHT DORSAL HAND LACERATION WITH REPAIR OF INDEX EXTENSOR TENDON LACERATION;  Surgeon: Corky Mull, MD;  Location: [facility];  Service: Orthopedics;  Laterality: Right;  ? 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  ? Procedure: 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 ASPECT OF  RIGHT HAND.;  Surgeon: Corky Mull, MD;  Location: [facility];  Service: Orthopedics;  Laterality: Right;  ? RESECTION OF HAND NEUROMA Right 03/23/2021  ? 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 UMBILICAL HERNIA REPAIR N/A 08/11/2019  ? Procedure: HERNIA REPAIR UMBILICAL ADULT;  Surgeon: Fredirick Maudlin, MD;  Location: [facility];  Service: General;  Laterality: N/A;  RNFA  ? ? ?Family History  ?Problem Relation Age of Onset  ? Stroke Father   ? Gout Father   ? Aneurysm Father   ?     2006  ? Thyroid disease Father   ? Epilepsy Father   ? Thyroid disease Sister   ? Diabetes Paternal Grandfather   ? Cancer Mother   ? ? ?Social History  ? ?Socioeconomic History  ? Marital status: Significant Other  ?  Spouse name: Not on file  ? Number of children: Not on file  ? Years of education: Not on file  ? Highest education level: Not on file  ?Occupational History  ? Not on file  ?Tobacco Use  ? Smoking status: Former  ?  Packs/day: 0.25  ?  Years: 15.00  ?  Pack years: 3.75  ?  Types: Cigarettes  ?  Quit date: 02/17/2020  ?  Years since quitting: 1.3  ? Smokeless tobacco: Never  ?Vaping Use  ?  Vaping Use: Never used  ?Substance and Sexual Activity  ? Alcohol use: Yes  ?  Alcohol/week: 6.0 standard drinks  ?  Types: 6 Cans of beer per week  ?  Comment: 6 a week "if that"  ? Drug use: No  ? Sexual activity: Not Currently  ?Other Topics Concern  ? Not on file  ?Social History Narrative  ? [place] Coletta Memos )Girlfriend  ? ?Social Determinants of Health  ? ?Financial Resource Strain: Not on file  ?Food Insecurity: No Food Insecurity  ? Worried About Charity fundraiser in the Last Year: Never true  ? Ran Out of Food in the Last Year: Never true  ?Transportation Needs: No Transportation Needs  ? Lack of Transportation (Medical):  No  ? Lack of Transportation (Non-Medical): No  ?Physical Activity: Not on file  ?Stress: Not on file  ?Social Connections: Not on file  ?Intimate Partner Violence: Not on file  ? ? ?Outpatient Medications Prior to Visit  ?Medication Sig Dispense Refill  ? famotidine (PEPCID) 20 MG tablet Take 20 mg by mouth 2 (two) times daily as needed for heartburn or indigestion.    ? hydrochlorothiazide (MICROZIDE) 12.5 MG capsule TAKE ONE CAPSULE (12.5 MG) BY MOUTH EVERY DAY 90 capsule 1  ? ibuprofen (ADVIL) 800 MG tablet Take 1 tablet (800 mg total) by mouth every 8 (eight) hours as needed for moderate pain. 60 tablet 1  ? meloxicam (MOBIC) 7.5 MG tablet Take 7.5 mg by mouth daily as needed for pain.    ? omeprazole (PRILOSEC) 20 MG capsule TAKE ONE CAPSULE BY MOUTH ONCE EVERY DAY. 90 capsule 1  ? sildenafil (VIAGRA) 100 MG tablet TAKE ONE TABLET BY MOUTH DAILY AS NEEDED FOR ERECTILE DYSFUNCTION 10 tablet 3  ? atorvastatin (LIPITOR) 40 MG tablet Take 1 tablet (40 mg total) by mouth at bedtime.    ? levothyroxine (SYNTHROID) 150 MCG tablet TAKE ONE TABLET BY MOUTH EVERY DAY BEFORE BREAKFAST 90 tablet 0  ? lisinopril (ZESTRIL) 20 MG tablet Take 1 tablet (20 mg total) by mouth at bedtime.    ? ?No facility-administered medications prior to visit.  ? ? ?No Known Allergies ? ?ROS ?Review of Systems  ?Constitutional: Negative.   ?Respiratory: Negative.    ?Cardiovascular: Negative.   ?Endocrine: Negative.   ?Skin: Negative.   ?Neurological: Negative.   ?Psychiatric/Behavioral: Negative.    ? ?  ?Objective:  ?  ?Physical Exam ?HENT:  ?   Head: Normocephalic and atraumatic.  ?   Mouth/Throat:  ?   Mouth: Mucous membranes are moist.  ?Eyes:  ?   Extraocular Movements: Extraocular movements intact.  ?   Conjunctiva/sclera: Conjunctivae normal.  ?   Pupils: Pupils are equal, round, and reactive to light.  ?Cardiovascular:  ?   Rate and Rhythm: Normal rate and regular rhythm.  ?   Pulses: Normal pulses.  ?   Heart sounds: Normal heart  sounds.  ?Pulmonary:  ?   Effort: Pulmonary effort is normal.  ?   Breath sounds: Normal breath sounds.  ?Skin: ?   General: Skin is warm.  ?Neurological:  ?   General: No focal deficit present.  ?   Mental Status: He is alert and oriented to person, place, and time. Mental status is at baseline.  ?Psychiatric:     ?   Mood and Affect: Mood normal.     ?   Behavior: Behavior normal.     ?   Thought Content: Thought content normal.     ?  Judgment: Judgment normal.  ? ? ?BP (!) 149/96 (BP Location: Left Arm, Patient Position: Sitting, Cuff Size: Large)   Pulse 83   Temp 98.3 ?F (36.8 ?C) (Oral)   Resp 17   Ht '5\' 9"'  (1.753 m)   Wt 233 lb 11.2 oz (106 kg)   SpO2 95%   BMI 34.51 kg/m?  ?Wt Readings from Last 3 Encounters:  ?06/02/21 233 lb 11.2 oz (106 kg)  ?04/26/21 221 lb (100.2 kg)  ?03/23/21 229 lb 1.6 oz (103.9 kg)  ? ?Encouraged weight loss ? ?Health Maintenance Due  ?Topic Date Due  ? COVID-19 Vaccine (1) Never done  ? FOOT EXAM  Never done  ? OPHTHALMOLOGY EXAM  Never done  ? HIV Screening  Never done  ? Hepatitis C Screening  Never done  ? Zoster Vaccines- Shingrix (1 of 2) Never done  ? ? ?There are no preventive care reminders to display for this patient. ? ?Lab Results  ?Component Value Date  ? TSH 8.030 (H) 05/26/2021  ? ?Lab Results  ?Component Value Date  ? WBC 6.4 01/13/2021  ? HGB 15.1 01/13/2021  ? HCT 44.7 01/13/2021  ? MCV 92 01/13/2021  ? PLT 213 01/13/2021  ? ?Lab Results  ?Component Value Date  ? NA 139 11/03/2020  ? K 3.5 03/16/2021  ? CO2 25 11/03/2020  ? GLUCOSE 122 (H) 11/03/2020  ? BUN 20 11/03/2020  ? CREATININE 0.91 11/03/2020  ? BILITOT 0.4 09/15/2020  ? ALKPHOS 95 09/15/2020  ? AST 27 09/15/2020  ? ALT 25 09/15/2020  ? PROT 7.0 09/15/2020  ? ALBUMIN 4.5 09/15/2020  ? CALCIUM 9.0 11/03/2020  ? ANIONGAP 12 11/03/2020  ? EGFR 102 09/15/2020  ? ?Lab Results  ?Component Value Date  ? CHOL 207 (H) 01/13/2021  ? ?Lab Results  ?Component Value Date  ? HDL 37 (L) 01/13/2021  ? ?Lab Results   ?Component Value Date  ? LDLCALC 131 (H) 01/13/2021  ? ?Lab Results  ?Component Value Date  ? TRIG 219 (H) 01/13/2021  ? ?Lab Results  ?Component Value Date  ? CHOLHDL 5.6 (H) 01/13/2021  ? ?Lab Results  ?Com

## 2021-06-02 NOTE — Patient Instructions (Signed)
Heart-Healthy Eating Plan ?Heart-healthy meal planning includes: ?Eating less unhealthy fats. ?Eating more healthy fats. ?Making other changes in your diet. ?Talk with your doctor or a diet specialist (dietitian) to create an eating plan that is right for you. ?What is my plan? ?Your doctor may recommend an eating plan that includes: ?Total fat: ______% or less of total calories a day. ?Saturated fat: ______% or less of total calories a day. ?Cholesterol: less than _________mg a day. ?What are tips for following this plan? ?Cooking ?Avoid frying your food. Try to bake, boil, grill, or broil it instead. You can also reduce fat by: ?Removing the skin from poultry. ?Removing all visible fats from meats. ?Steaming vegetables in water or broth. ?Meal planning ? ?At meals, divide your plate into four equal parts: ?Fill one-half of your plate with vegetables and green salads. ?Fill one-fourth of your plate with whole grains. ?Fill one-fourth of your plate with lean protein foods. ?Eat 4-5 servings of vegetables per day. A serving of vegetables is: ?1 cup of raw or cooked vegetables. ?2 cups of raw leafy greens. ?Eat 4-5 servings of fruit per day. A serving of fruit is: ?1 medium whole fruit. ?? cup of dried fruit. ?? cup of fresh, frozen, or canned fruit. ?? cup of 100% fruit juice. ?Eat more foods that have soluble fiber. These are apples, broccoli, carrots, beans, peas, and barley. Try to get 20-30 g of fiber per day. ?Eat 4-5 servings of nuts, legumes, and seeds per week: ?1 serving of dried beans or legumes equals ? cup after being cooked. ?1 serving of nuts is ? cup. ?1 serving of seeds equals 1 tablespoon. ?General information ?Eat more home-cooked food. Eat less restaurant, buffet, and fast food. ?Limit or avoid alcohol. ?Limit foods that are high in starch and sugar. ?Avoid fried foods. ?Lose weight if you are overweight. ?Keep track of how much salt (sodium) you eat. This is important if you have high blood  pressure. Ask your doctor to tell you more about this. ?Try to add vegetarian meals each week. ?Fats ?Choose healthy fats. These include olive oil and canola oil, flaxseeds, walnuts, almonds, and seeds. ?Eat more omega-3 fats. These include salmon, mackerel, sardines, tuna, flaxseed oil, and ground flaxseeds. Try to eat fish at least 2 times each week. ?Check food labels. Avoid foods with trans fats or high amounts of saturated fat. ?Limit saturated fats. ?These are often found in animal products, such as meats, butter, and cream. ?These are also found in plant foods, such as palm oil, palm kernel oil, and coconut oil. ?Avoid foods with partially hydrogenated oils in them. These have trans fats. Examples are stick margarine, some tub margarines, cookies, crackers, and other baked goods. ?What foods can I eat? ?Fruits ?All fresh, canned (in natural juice), or frozen fruits. ?Vegetables ?Fresh or frozen vegetables (raw, steamed, roasted, or grilled). Green salads. ?Grains ?Most grains. Choose whole wheat and whole grains most of the time. Rice and pasta, including brown rice and pastas made with whole wheat. ?Meats and other proteins ?Lean, well-trimmed beef, veal, pork, and lamb. Chicken and [place] without skin. All fish and shellfish. Wild duck, rabbit, pheasant, and venison. Egg whites or low-cholesterol egg substitutes. Dried beans, peas, lentils, and tofu. Seeds and most nuts. ?Dairy ?Low-fat or nonfat cheeses, including ricotta and mozzarella. Skim or 1% milk that is liquid, powdered, or evaporated. Buttermilk that is made with low-fat milk. Nonfat or low-fat yogurt. ?Fats and oils ?Non-hydrogenated (trans-free) margarines. Vegetable oils, including  soybean, sesame, sunflower, olive, peanut, safflower, corn, canola, and cottonseed. Salad dressings or mayonnaise made with a vegetable oil. ?Beverages ?Mineral water. Coffee and tea. Diet carbonated beverages. ?Sweets and desserts ?Sherbet, gelatin, and fruit ice.  Small amounts of dark chocolate. ?Limit all sweets and desserts. ?Seasonings and condiments ?All seasonings and condiments. ?The items listed above may not be a complete list of foods and drinks you can eat. Contact a dietitian for more options. ?What foods should I avoid? ?Fruits ?Canned fruit in heavy syrup. Fruit in cream or butter sauce. Fried fruit. Limit coconut. ?Vegetables ?Vegetables cooked in cheese, cream, or butter sauce. Fried vegetables. ?Grains ?Breads that are made with saturated or trans fats, oils, or whole milk. Croissants. Sweet rolls. Donuts. High-fat crackers, such as cheese crackers. ?Meats and other proteins ?Fatty meats, such as hot dogs, ribs, sausage, bacon, rib-eye roast or steak. High-fat deli meats, such as salami and bologna. Caviar. Domestic duck and goose. Organ meats, such as liver. ?Dairy ?Cream, sour cream, cream cheese, and creamed cottage cheese. Whole-milk cheeses. Whole or 2% milk that is liquid, evaporated, or condensed. Whole buttermilk. Cream sauce or high-fat cheese sauce. Yogurt that is made from whole milk. ?Fats and oils ?Meat fat, or shortening. Cocoa butter, hydrogenated oils, palm oil, coconut oil, palm kernel oil. Solid fats and shortenings, including bacon fat, salt pork, lard, and butter. Nondairy cream substitutes. Salad dressings with cheese or sour cream. ?Beverages ?Regular sodas and juice drinks with added sugar. ?Sweets and desserts ?Frosting. Pudding. Cookies. Cakes. Pies. Milk chocolate or white chocolate. Buttered syrups. Full-fat ice cream or ice cream drinks. ?The items listed above may not be a complete list of foods and drinks to avoid. Contact a dietitian for more information. ?Summary ?Heart-healthy meal planning includes eating less unhealthy fats, eating more healthy fats, and making other changes in your diet. ?Eat a balanced diet. This includes fruits and vegetables, low-fat or nonfat dairy, lean protein, nuts and legumes, whole grains, and  heart-healthy oils and fats. ?This information is not intended to replace advice given to you by your health care provider. Make sure you discuss any questions you have with your health care provider. ?Document Revised: 07/15/2020 Document Reviewed: 07/15/2020 ?Elsevier Patient Education ? [place] Eating Plan ?DASH stands for Dietary Approaches to Stop Hypertension. The DASH eating plan is a healthy eating plan that has been shown to: ?Reduce high blood pressure (hypertension). ?Reduce your risk for type 2 diabetes, heart disease, and stroke. ?Help with weight loss. ?What are tips for following this plan? ?Reading food labels ?Check food labels for the amount of salt (sodium) per serving. Choose foods with less than 5 percent of the Daily Value of sodium. Generally, foods with less than 300 milligrams (mg) of sodium per serving fit into this eating plan. ?To find whole grains, look for the word "whole" as the first word in the ingredient list. ?Shopping ?Buy products labeled as "low-sodium" or "no salt added." ?Buy fresh foods. Avoid canned foods and pre-made or frozen meals. ?Cooking ?Avoid adding salt when cooking. Use salt-free seasonings or herbs instead of table salt or sea salt. Check with your health care provider or pharmacist before using salt substitutes. ?Do not fry foods. Cook foods using healthy methods such as baking, boiling, grilling, roasting, and broiling instead. ?Cook with heart-healthy oils, such as olive, canola, avocado, soybean, or sunflower oil. ?Meal planning ? ?Eat a balanced diet that includes: ?4 or more servings of fruits and 4 or more  servings of vegetables each day. Try to fill one-half of your plate with fruits and vegetables. ?6-8 servings of whole grains each day. ?Less than 6 oz (170 g) of lean meat, poultry, or fish each day. A 3-oz (85-g) serving of meat is about the same size as a deck of cards. One egg equals 1 oz (28 g). ?2-3 servings of low-fat dairy each  day. One serving is 1 cup (237 mL). ?1 serving of nuts, seeds, or beans 5 times each week. ?2-3 servings of heart-healthy fats. Healthy fats called omega-3 fatty acids are found in foods such as walnuts, flaxseeds

## 2021-06-03 ENCOUNTER — Ambulatory Visit: Payer: PRIVATE HEALTH INSURANCE | Attending: Student | Admitting: Occupational Therapy

## 2021-06-03 ENCOUNTER — Other Ambulatory Visit: Payer: Self-pay

## 2021-06-03 DIAGNOSIS — M25641 Stiffness of right hand, not elsewhere classified: Secondary | ICD-10-CM | POA: Diagnosis present

## 2021-06-03 DIAGNOSIS — M79641 Pain in right hand: Secondary | ICD-10-CM | POA: Diagnosis present

## 2021-06-03 DIAGNOSIS — M25631 Stiffness of right wrist, not elsewhere classified: Secondary | ICD-10-CM | POA: Diagnosis present

## 2021-06-03 DIAGNOSIS — M6281 Muscle weakness (generalized): Secondary | ICD-10-CM | POA: Insufficient documentation

## 2021-06-03 DIAGNOSIS — L905 Scar conditions and fibrosis of skin: Secondary | ICD-10-CM | POA: Insufficient documentation

## 2021-06-03 NOTE — Therapy (Signed)
?[facility] ?[address] ?[place], [place], [address] ?Phone: [phone]   Fax:  [phone] ? ?Occupational Therapy Treatment ? ?Patient Details  ?Name: Douglas Bird ?MRN: [id number] ?Date of Birth: 03/23/1965 ?Referring Provider (OT): Dr Roland Rack ? ? ?Encounter Date: 06/03/2021 ? ? OT End of Session - 06/03/21 1245   ? ? Visit Number 15   ? Number of Visits 16   ? Date for OT Re-Evaluation 06/20/21   ? OT Start Time [phone]   ? OT Stop Time 0915   ? OT Time Calculation (min) 40 min   ? Activity Tolerance Patient tolerated treatment well   ? Behavior During Therapy [facility] for tasks assessed/performed   ? ?  ?  ? ?  ? ? ?Past Medical History:  ?Diagnosis Date  ? Diverticulitis 2011  ? GERD (gastroesophageal reflux disease)   ? Hyperlipidemia   ? Hypertension   ? controlled on meds  ? Hypothyroidism   ? Thyroid disease   ? ? ?Past Surgical History:  ?Procedure Laterality Date  ? CHOLECYSTECTOMY  June 2013  ? COLONOSCOPY WITH PROPOFOL N/A 04/28/2019  ? Procedure: COLONOSCOPY WITH PROPOFOL;  Surgeon: Lin Landsman, MD;  Location: [facility];  Service: Endoscopy;  Laterality: N/A;  priority 4  ? HERNIA REPAIR Bilateral   ? inguinal  ? REPAIR EXTENSOR TENDON Right 04/28/2020  ? Procedure: EXPLORATION OF RIGHT DORSAL HAND LACERATION WITH REPAIR OF INDEX EXTENSOR TENDON LACERATION;  Surgeon: Corky Mull, MD;  Location: [facility];  Service: Orthopedics;  Laterality: Right;  ? 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  ? 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 RESECTION OF HAND NEUROMA Right 03/23/2021  ? 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 UMBILICAL HERNIA REPAIR N/A 08/11/2019  ? Procedure: HERNIA REPAIR UMBILICAL ADULT;  Surgeon: Fredirick Maudlin, MD;  Location: [facility];  Service: General;  Laterality: N/A;  RNFA  ? ? ?There were no vitals filed for this visit. ? ? Subjective Assessment - 06/03/21 1244   ? ? Subjective  I made myself weight rack -and done some 8-10 lbs but 10 bothered me - I could do 8 lbs - and basketball could do but bothered by shooting - bending back my wrist - otherwise others better   ? Pertinent History Pt had saw accident on 04/26/20 at [facility] and then Surgery by Dr Roland Rack on 04/28/20 - Primary repair of right EIP and index EDC tendons.2. Irrigation and debridement with primary repair of right dorsal hand laceration (approximately 10 cm in length). Pt was seen for OT and cont to had nerve pain -11/10/20 had traumatic Neuroma excision -NOW repeat evaluation status post a excision/reimplantation of traumatic neuroma involving the dorsal aspect of the right hand. Surgery was performed by Dr. Roland Rack on 03/23/2021. 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 The patient was doing well until he was hammering in some fence post and suffered a recurrence of his painful neuroma. The patient presents last week to surgeon off-  reporting no injury or trauma affecting the right hand  since his last surgery. He does report numbness and tingling especially in the second and third digits of the right hand. The patient take ibuprofen addition to occasional hydrocodone as needed for discomfort. Pain score today is a 5 out of 10- Refer to OT - pt to keep splint on until appt with OT this date - and to take off only for HEP and keep on untill follow up appt with surgeon 04/18/21   ? Patient Stated Goals Want to get the use of my R hand back to be able to work and take care of the animals and yard /garden at home   ? Currently in Pain? No/denies   ? ?  ?  ? ?   ? ? ? ? ? ?Coming in pain free  -no pain with wrist AROM in all planes - and resistance  ?Fisting and opposition no pain - tight fist with 2nd and 3rd digit - pull with light  burn  and with wrist into flexion ?With Compression glove on and off ?Cont to feel most of symptoms with 3 point pinch tight with pull of object - pain 2/10  ?  ?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[employer], [employer] , making bed - about 3 lbs objects ?Cont with Isotoner glove for compression over dorsal hand as needed  ?Pt can start using hand in anything less than 5 lbs - and bilateral hands 10 lbs - around the house and farm  ?But NO  pounding , hitting,  gripping  and pulling forceful - vibration tools  ?  ?   ?  ?  ?Cont table slides for wrist extention - 20 reps - light and slight pull  ?And add this date extended arm /elbow  for wrist flexion and wrist extention on wall -composite stretch or nerve glide - 5 reps hold 5 sec  ? ?Cont 2-3 lbs  sup/pro, RD, UD  20 reps - pain free  ? 20 reps -2 sets ?2 x day -but keep pain 0-1/1 ? ?med teal putty  -  but keep it ball - 2  x 10-reps   ?twisting away and towards him 12 -15 reps ?Add pulling all digits - 20 reps - pain free  ?  ?GTB for pulling , and lifting into retraction and elbow flexion- simulating lifting hay  ? HOLD off on Pulling lightly with 3 point pinch band - into retraction - wrist straight ( HAD pain) ?12-15 reps ?2 x day  ?OR bilateral hands weight at home 10 lbs  ?  ?Problem areas for patient at the moment ?Patient appeared to be more irritated when doing a composite tight fist with second and third digit with wrist into flexion ?Resistance abduction and abduction of second and third digits ?And three-point pinch with resistance or repetition ?  ?Biodex able to do 10-15 lbs pull, push and lift -but 15 lbs increase symptoms after 12 reps ? Stabilization ball on wall with palm and digits , wrist and elbow extention - had increase pain and burning with digits  extention with wrist and elbow if longer than 45 sec  ?Throw and catch weighted ball on rebounder - able to do 30-45 sec increase symptoms with catch - 2nd 45 sec  ? Pt to do basketball at home - on wall and dribbling and shooting - 30-45 sec at time  had some irritation with throw both hands chest and catch  ?Assess how many sets or  reps able to do without symptoms at hone ?  ?  ? ? ? ? ? ? ? ? ? ? ? ? ? ? ? ? ? OT Education - 06/03/21 1245   ? ? Education Details progress and changes to HEP   ? Person(s) Educated Patient   ? Methods Explanation;Demonstration;Tactile cues;Verbal cues;Handout   ? Comprehension Verbal cues required;Returned demonstration;Verbalized understanding   ? ?  ?  ? ?  ? ? ? OT Short Term Goals - 04/11/21 1605   ? ?  ? OT SHORT TERM GOAL #1  ? Title Pt to be independent in HEP to tolerate desentitization of different textures/gentle scar massage and full extention of digits to donn gloves  with discomfort less than 2/10   ? Baseline at rest pain - burning 1-5/10 - numbness over dorsal 2nd and 3rd digits and in webspace- cannot toleate touching of dorsal hand over 2nd and 3rd MC's and incision   ? Time 4   ? Period Weeks   ? Status New   ? Target Date 05/09/21   ?  ? OT SHORT TERM GOAL #2  ? Title R wrist AROM increase to WNL without increase symptoms   ? Baseline pain over dorsal hand -increase pull - increase with wrist flexion with gravity   ? Time 3   ? Period Weeks   ? Status New   ? Target Date 05/02/21   ? ?  ?  ? ?  ? ? ? ? OT Long Term Goals - 04/11/21 1604   ? ?  ? OT LONG TERM GOAL #1  ? Title Pt R hand digits flexion increase for pt to touch palm to grip and release IADLs objects with pain less than 2-3/10   ? Baseline in soft volar splint /cast - except with HEP, pain at rest 1-5/10 pain over dorsal hand   ? Time 5   ? Period Weeks   ? Status New   ? Target Date 05/16/21   ?  ? OT LONG TERM GOAL #2  ? Title Grip strength increase to more than 50% compare to L to grip and pick up  objects more than 5 lbs without symptoms   ? Baseline NT - 2 1/2 wkss/p - volar splint   ? Time 10   ? Period Weeks   ? Status New   ? Target Date 06/20/21   ?  ? OT LONG TERM GOAL #3  ? Title Product/process development scientist

## 2021-06-06 ENCOUNTER — Other Ambulatory Visit: Payer: Self-pay

## 2021-06-06 ENCOUNTER — Other Ambulatory Visit: Payer: Self-pay | Admitting: Gerontology

## 2021-06-06 DIAGNOSIS — I10 Essential (primary) hypertension: Secondary | ICD-10-CM

## 2021-06-06 DIAGNOSIS — E782 Mixed hyperlipidemia: Secondary | ICD-10-CM

## 2021-06-07 ENCOUNTER — Other Ambulatory Visit: Payer: Self-pay

## 2021-06-07 ENCOUNTER — Ambulatory Visit: Payer: PRIVATE HEALTH INSURANCE | Attending: Student | Admitting: Occupational Therapy

## 2021-06-07 ENCOUNTER — Other Ambulatory Visit: Payer: Self-pay | Admitting: Gerontology

## 2021-06-07 DIAGNOSIS — M25641 Stiffness of right hand, not elsewhere classified: Secondary | ICD-10-CM | POA: Insufficient documentation

## 2021-06-07 DIAGNOSIS — E782 Mixed hyperlipidemia: Secondary | ICD-10-CM

## 2021-06-07 DIAGNOSIS — I10 Essential (primary) hypertension: Secondary | ICD-10-CM

## 2021-06-07 DIAGNOSIS — M79641 Pain in right hand: Secondary | ICD-10-CM | POA: Diagnosis present

## 2021-06-07 DIAGNOSIS — M25631 Stiffness of right wrist, not elsewhere classified: Secondary | ICD-10-CM | POA: Insufficient documentation

## 2021-06-07 DIAGNOSIS — M6281 Muscle weakness (generalized): Secondary | ICD-10-CM | POA: Diagnosis present

## 2021-06-07 DIAGNOSIS — L905 Scar conditions and fibrosis of skin: Secondary | ICD-10-CM | POA: Diagnosis not present

## 2021-06-07 MED ORDER — LISINOPRIL 20 MG PO TABS: 20.0000 mg | ORAL_TABLET | Freq: Every day | ORAL | 1 refills | Status: DC

## 2021-06-07 MED ORDER — ATORVASTATIN CALCIUM 40 MG PO TABS: 40.0000 mg | ORAL_TABLET | Freq: Every evening | ORAL | 1 refills | Status: DC

## 2021-06-07 MED FILL — Atorvastatin Calcium Tab 40 MG (Base Equivalent): ORAL | 90 days supply | Qty: 90 | Fill #0 | Status: AC

## 2021-06-07 MED FILL — Lisinopril Tab 20 MG: ORAL | 90 days supply | Qty: 90 | Fill #0 | Status: AC

## 2021-06-07 NOTE — Therapy (Signed)
[facility] ?[address] ?[place], [place], [address] ?Phone: [phone]   Fax:  [phone] ? ?Occupational Therapy Treatment ? ?Patient Details  ?Name: Douglas Bird ?MRN: [id number] ?Date of Birth: [birth year] ?Referring Provider (OT): Dr Roland Rack ? ? ?Encounter Date: 06/07/2021 ? ? OT End of Session - 06/07/21 0837   ? ? Visit Number 16   ? Number of Visits 20   ? Date for OT Re-Evaluation 06/20/21   ? OT Start Time [phone]   ? OT Stop Time 0858   ? OT Time Calculation (min) 41 min   ? Activity Tolerance Patient tolerated treatment well   ? Behavior During Therapy [facility] for tasks assessed/performed   ? ?  ?  ? ?  ? ? ?Past Medical History:  ?Diagnosis Date  ? Diverticulitis 2011  ? GERD (gastroesophageal reflux disease)   ? Hyperlipidemia   ? Hypertension   ? controlled on meds  ? Hypothyroidism   ? Thyroid disease   ? ? ?Past Surgical History:  ?Procedure Laterality Date  ? CHOLECYSTECTOMY  June 2013  ? COLONOSCOPY WITH PROPOFOL N/A 04/28/2019  ? Procedure: COLONOSCOPY WITH PROPOFOL;  Surgeon: Lin Landsman, MD;  Location: [place];  Service: Endoscopy;  Laterality: N/A;  priority 4  ? HERNIA REPAIR Bilateral   ? inguinal  ? REPAIR EXTENSOR TENDON Right 04/28/2020  ? Procedure: EXPLORATION OF RIGHT DORSAL HAND LACERATION WITH REPAIR OF INDEX EXTENSOR TENDON LACERATION;  Surgeon: Corky Mull, MD;  Location: [facility];  Service: Orthopedics;  Laterality: Right;  ? 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  ? 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 RESECTION OF HAND NEUROMA Right 03/23/2021  ? 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 UMBILICAL HERNIA REPAIR N/A 08/11/2019  ? Procedure: HERNIA REPAIR UMBILICAL ADULT;  Surgeon: Fredirick Maudlin, MD;  Location: [facility];  Service: General;  Laterality: N/A;  RNFA  ? ? ?There were no vitals filed for this visit. ? ? Subjective Assessment - 06/07/21 0830   ? ? Subjective  I done all you said and everything was good I did the basketball dribbling and shooting , on the wall -and then with my grandson -he threw on -and I was distracted and was looking away and had to block the ball -and felt pain back of hand- that happend Sunday - done heat/ice , glove and only some light motion of hand - but pain still 3/10 and tender  throw little harder than usually and had pain and some numbness again over the hand - stopped everything   ? Pertinent History Pt had saw accident on 04/26/20 at [facility] and then Surgery by Dr Roland Rack on 04/28/20 - Primary repair of right EIP and index EDC tendons.2. Irrigation and debridement with primary repair of right dorsal hand laceration (approximately 10 cm in length). Pt was seen for OT and cont to had nerve pain -11/10/20 had traumatic Neuroma excision -NOW repeat evaluation status post a excision/reimplantation of traumatic neuroma involving the dorsal aspect of the right hand. Surgery was performed by Dr. Roland Rack on 03/23/2021. The patient had previously undergone a excision  of painful neuroma  from the right hand performed in August 2022. The patient was doing well until he was hammering in some fence post and suffered a recurrence of his painful neuroma. The patient presents last week to surgeon off-  reporting no injury or trauma affecting the right hand since his last surgery. He does report numbness and tingling especially in the second and third digits of the right hand. The patient take ibuprofen addition to occasional hydrocodone as needed for discomfort. Pain score today is a 5 out of 10- Refer to OT - pt to keep splint on until appt with OT this date - and  to take off only for HEP and keep on untill follow up appt with surgeon 04/18/21   ? Patient Stated Goals Want to get the use of my R hand back to be able to work and take care of the animals and yard /garden at home   ? Currently in Pain? Yes   ? Pain Score 3    ? Pain Location Hand   ? Pain Orientation Right   ? Pain Descriptors / Indicators Burning;Tingling;Tightness   ? Pain Type Surgical pain   ? Pain Onset 1 to 4 weeks ago   ? Pain Frequency Intermittent   ? ?  ?  ? ?  ? ? ? ?  Patient arrived today with increased pain, pulling, tightness with pins-and-needles and numbness over right second and third metacarpal and digits ?Tender with tapping and rubbing soft massage ?Patient report was doing his basketball home exercises when was tossing back-and-forth with grandson-was distracted and had to block a throw ?Pain about a 3/10 over the dorsal hand into second and third metacarpal ? ? ? ? ? ? ? ? ? ? ? ? OT Treatments/Exercises (OP) - 06/07/21 0001   ? ?  ? RUE Fluidotherapy  ? Number Minutes Fluidotherapy 15 Minutes   ? RUE Fluidotherapy Location Hand;Wrist   ? Comments 3 cycles of ice   ? ?  ?  ? ?  ? ?  Done contrast several times today using fluidotherapy to decrease pain to 1/10.  Patient continues to do fisting and tendon gliding irritating nerve at second and third metacarpal.  Reinforced with patient to do contrast at home several times today and using Isotoner glove and if needed Benik neoprene on wrist ?Hold off on repetitive testing resting hand ?Reassess tomorrow morning if feeling better can start back active range of motion pain-free ?Otherwise follow-up with me on Thursday again ?Patient 3/10 pain today after resting and composite fisting and wrist active range of motion ?Held off on any strengthening or simulation of activities ?We will reassess Thursday ? ? ? ? ? ? ? OT Education - 06/07/21 0837   ? ? Education Details decrease pain and changes to HEP   ? Person(s) Educated Patient   ? Methods  Explanation;Demonstration;Tactile cues;Verbal cues;Handout   ? Comprehension Verbal cues required;Returned demonstration;Verbalized understanding   ? ?  ?  ? ?  ? ? ? OT Short Term Goals - 04/11/21 1605   ? ?  ? OT SHORT TERM GOAL #1  ? Title Pt to be independent in HEP to tolerate desentitization of different textures/gentle scar massage and full extention of digits to donn gloves  with discomfort less than 2/10   ? Baseline at rest pain - burning 1-5/10 - numbness over dorsal 2nd and 3rd digits and in webspace- cannot toleate touching of dorsal hand over 2nd and 3rd  MC's and incision   ? Time 4   ? Period Weeks   ? Status New   ? Target Date 05/09/21   ?  ? OT SHORT TERM GOAL #2  ? Title R wrist AROM increase to WNL without increase symptoms   ? Baseline pain over dorsal hand -increase pull - increase with wrist flexion with gravity   ? Time 3   ? Period Weeks   ? Status New   ? Target Date 05/02/21   ? ?  ?  ? ?  ? ? ? ? OT Long Term Goals - 04/11/21 1604   ? ?  ? OT LONG TERM GOAL #1  ? Title Pt R hand digits flexion increase for pt to touch palm to grip and release IADLs objects with pain less than 2-3/10   ? Baseline in soft volar splint /cast - except with HEP, pain at rest 1-5/10 pain over dorsal hand   ? Time 5   ? Period Weeks   ? Status New   ? Target Date 05/16/21   ?  ? OT LONG TERM GOAL #2  ? Title Grip strength increase to more than 50% compare to L to grip and pick up objects more than 5 lbs without symptoms   ? Baseline NT - 2 1/2 wkss/p - volar splint   ? Time 10   ? Period Weeks   ? Status New   ? Target Date 06/20/21   ?  ? OT LONG TERM GOAL #3  ? Title Prehension strength increase to more than 50% compare to L to cut food, open packages, use in farm with animals   ? Baseline 2 1/2 wks s/p - volar splint   ? Time 8   ? Period Weeks   ? Status New   ? Target Date 06/06/21   ?  ? OT LONG TERM GOAL #4  ? Title Function increas for pt to push ,pull door open, push up from chair, hammer nail , shake  bags for feeding animals   ? Baseline 2 1/2 wks s/p   ? Time 10   ? Period Weeks   ? Status New   ? Target Date 06/20/21   ? ?  ?  ? ?  ? ? ? ? ? ? ? ? Plan - 06/07/21 0837   ? ? Clinical Impression Statem

## 2021-06-09 ENCOUNTER — Ambulatory Visit: Payer: Worker's Compensation | Admitting: Occupational Therapy

## 2021-06-12 DIAGNOSIS — L905 Scar conditions and fibrosis of skin: Secondary | ICD-10-CM | POA: Diagnosis present

## 2021-06-12 DIAGNOSIS — M79641 Pain in right hand: Secondary | ICD-10-CM | POA: Diagnosis present

## 2021-06-12 DIAGNOSIS — M6281 Muscle weakness (generalized): Secondary | ICD-10-CM | POA: Diagnosis present

## 2021-06-12 DIAGNOSIS — M25631 Stiffness of right wrist, not elsewhere classified: Secondary | ICD-10-CM | POA: Diagnosis present

## 2021-06-12 DIAGNOSIS — M25641 Stiffness of right hand, not elsewhere classified: Secondary | ICD-10-CM | POA: Diagnosis present

## 2021-06-14 ENCOUNTER — Other Ambulatory Visit: Payer: Self-pay

## 2021-06-14 ENCOUNTER — Ambulatory Visit: Payer: PRIVATE HEALTH INSURANCE | Admitting: Occupational Therapy

## 2021-06-14 DIAGNOSIS — M6281 Muscle weakness (generalized): Secondary | ICD-10-CM

## 2021-06-14 DIAGNOSIS — M25641 Stiffness of right hand, not elsewhere classified: Secondary | ICD-10-CM

## 2021-06-14 DIAGNOSIS — L905 Scar conditions and fibrosis of skin: Secondary | ICD-10-CM

## 2021-06-14 DIAGNOSIS — M79641 Pain in right hand: Secondary | ICD-10-CM

## 2021-06-14 DIAGNOSIS — M25631 Stiffness of right wrist, not elsewhere classified: Secondary | ICD-10-CM

## 2021-06-14 NOTE — Therapy (Signed)
[facility] ?[address] ?[place], [place], [address] ?Phone: [phone]   Fax:  [phone] ? ?Occupational Therapy Treatment ? ?Patient Details  ?Name: Douglas Bird ?MRN: [id number] ?Date of Birth: Nov 16, 1965 ?Referring Provider (OT): Dr Roland Rack ? ? ?Encounter Date: 06/14/2021 ? ? OT End of Session - 06/14/21 0910   ? ? Visit Number 17   ? Number of Visits 20   ? Date for OT Re-Evaluation 06/20/21   ? OT Start Time 0900   ? OT Stop Time 0944   ? OT Time Calculation (min) 44 min   ? Activity Tolerance Patient tolerated treatment well   ? Behavior During Therapy [facility] for tasks assessed/performed   ? ?  ?  ? ?  ? ? ?Past Medical History:  ?Diagnosis Date  ? Diverticulitis 2011  ? GERD (gastroesophageal reflux disease)   ? Hyperlipidemia   ? Hypertension   ? controlled on meds  ? Hypothyroidism   ? Thyroid disease   ? ? ?Past Surgical History:  ?Procedure Laterality Date  ? CHOLECYSTECTOMY  June 2013  ? COLONOSCOPY WITH PROPOFOL N/A 04/28/2019  ? Procedure: COLONOSCOPY WITH PROPOFOL;  Surgeon: Lin Landsman, MD;  Location: [facility];  Service: Endoscopy;  Laterality: N/A;  priority 4  ? HERNIA REPAIR Bilateral   ? inguinal  ? REPAIR EXTENSOR TENDON Right 04/28/2020  ? Procedure: EXPLORATION OF RIGHT DORSAL HAND LACERATION WITH REPAIR OF INDEX EXTENSOR TENDON LACERATION;  Surgeon: Corky Mull, MD;  Location: [facility];  Service: Orthopedics;  Laterality: Right;  ? 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  ? 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 RESECTION OF HAND NEUROMA Right 03/23/2021  ? 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 UMBILICAL HERNIA REPAIR N/A 08/11/2019  ? Procedure: HERNIA REPAIR UMBILICAL ADULT;  Surgeon: Fredirick Maudlin, MD;  Location: [facility];  Service: General;  Laterality: N/A;  RNFA  ? ? ?There were no vitals filed for this visit. ? ? Subjective Assessment - 06/14/21 0910   ? ? Subjective  I have bad allergies I was so sick sorry I missed last week 1 time.  My pain did decrease 24 hours later after seeing you last.  I did mow the lawn yesterday, vacuuming mopping in the house, I tried to use the pitchfork in the barn could do about 30 minutes.  I tried to put a ceiling fan in but pushing up overhead with palm and impact drill still bothers me   ? Pertinent History Pt had saw accident on 04/26/20 at [facility] and then Surgery by Dr Roland Rack on 04/28/20 - Primary repair of right EIP and index EDC tendons.2. Irrigation and debridement with primary repair of right dorsal hand laceration (approximately 10 cm in length). Pt was seen for OT and cont to had nerve pain -11/10/20 had traumatic Neuroma excision -NOW repeat evaluation status post a excision/reimplantation of traumatic neuroma involving the dorsal aspect of the right hand. Surgery was performed by Dr. Roland Rack on 03/23/2021. 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 The patient was  doing well until he was hammering in some fence post and suffered a recurrence of his painful neuroma. The patient presents last week to surgeon off-  reporting no injury or trauma affecting the right hand since his last surgery. He does report numbness and tingling especially in the second and third digits of the right hand. The patient take ibuprofen addition to occasional hydrocodone as needed for discomfort. Pain score today is a 5 out of 10- Refer to OT - pt to keep splint on until appt with OT this date - and to take off only for HEP and keep on untill follow up appt with surgeon 04/18/21   ? Patient Stated  Goals Want to get the use of my R hand back to be able to work and take care of the animals and yard /garden at home   ? Currently in Pain? No/denies   ? ?  ?  ? ?  ? ? ? ? ? [facility] OT Assessment - 06/14/21 0001   ? ?  ? Strength  ? Right Hand Grip (lbs) 85   ? Right Hand Lateral Pinch 30 lbs   ? Right Hand 3 Point Pinch 24 lbs   ? Left Hand Grip (lbs) 95   ? Left Hand Lateral Pinch 26 lbs   ? Left Hand 3 Point Pinch 19 lbs   ? ?  ?  ? ?  ? ? ? ? ? ?Coming in pain free  -no pain with wrist AROM in all planes - and resistance  ?Fisting and opposition no pain - tight fist with 2nd and 3rd digit - pull with light  burn  and with wrist into flexion ?With Compression glove on and off ?  ?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[employer], [employer] , making bed , vacuum , used some for about 30 min clean stall of goats, lawn mower ? ?Cont with Isotoner glove for compression over dorsal hand as needed  and Benik as needed for wrist  ?Pt can start using  bilateral hands in 15-20 lbs  around the house and farm  ?But NO  pounding , hitting,   forceful  movement- vibration tools  with impact ?  ?   ?  ?  ?Cont table slides for wrist extention - 20 reps - light and slight pull  ?Wall push ups and weight bearing thru palm on wall - 30 sec pain free- can repeat 3 x  ?And wall pushups if need can use Benik  ?  ?Cont 2-3 lbs  sup/pro, RD, UD  20 reps - pain free  ? 20 reps -2 sets ?2 x day -but keep pain 0-1/1 ?  ?med teal putty  -  but keep it ball - 2  x 10-reps   ?twisting away and towards him 12 -15 reps ? ?Simulated pulling  to start weed eater - BTB and silver - inbetween 3rd  and 4th - could do  ?But wrist collapse into some flexion  ?Cone 8 reps  ?Pt to use blue - and try 2 sets of 8 and then one set between 2nd and 3rd  ? ?  ?BTBfor pulling , and lifting into retraction and elbow flexion- simulating lifting hay  ?  ?12-15 reps ?2 x day  ?OR bilateral hands weight at home 15-20lbs  ?  ?Problem areas for patient  at the moment ?Weightbearing through palm and 80 to 90 degrees review and done table slides,  wall push-ups and weightbearing through palm prior to catch and throw 1 kg and 2 kg ball-throwing her right hand catching with bilateral could do 30 seconds patient to do at home basketball against wall, dribbling to get comfortably in the 70 to 90 degree extension range ? ?Add rubber band for abduction abduction of digits tolerating well today ? ?  ?Biodex able to do 15-20 lbs pull, push and lift - ? Throw and catch weighted ball  1 -2 kg ball on rebounder - able to do 30  sec increase symptoms with catch - ? Pt to do basketball at home - on wall and dribbling and shooting - 30-45 sec at time  had some irritation with throw both hands chest and catch  ?Patient can start increasing use of hands every 15-20 pounds with bilateral hand use like vacuuming with a Benik on housework stuff, need to be careful with wheelbarrow pitchfork and shovel-reinforce no impact ?No pounding to keep pain under 2/10 ?Increase time of use gradually-  ?Assess how many sets or reps able to do without symptoms at hone ?  ?  ?  ?  ?  ?  ? ? ? ? ? ? ? ? ? ? ? ? ? OT Education - 06/14/21 0910   ? ? Education Details decrease pain and changes to HEP   ? Person(s) Educated Patient   ? Methods Explanation;Demonstration;Tactile cues;Verbal cues;Handout   ? Comprehension Verbal cues required;Returned demonstration;Verbalized understanding   ? ?  ?  ? ?  ? ? ? OT Short Term Goals - 04/11/21 1605   ? ?  ? OT SHORT TERM GOAL #1  ? Title Pt to be independent in HEP to tolerate desentitization of different textures/gentle scar massage and full extention of digits to donn gloves  with discomfort less than 2/10   ? Baseline at rest pain - burning 1-5/10 - numbness over dorsal 2nd and 3rd digits and in webspace- cannot toleate touching of dorsal hand over 2nd and 3rd MC's and incision   ? Time 4   ? Period Weeks   ? Status New   ? Target Date 05/09/21   ?  ? OT  SHORT TERM GOAL #2  ? Title R wrist AROM increase to WNL without increase symptoms   ? Baseline pain over dorsal hand -increase pull - increase with wrist flexion with gravity   ? Time 3   ? Period Weeks

## 2021-06-16 ENCOUNTER — Ambulatory Visit: Payer: PRIVATE HEALTH INSURANCE | Attending: Student | Admitting: Occupational Therapy

## 2021-06-16 DIAGNOSIS — L905 Scar conditions and fibrosis of skin: Secondary | ICD-10-CM | POA: Insufficient documentation

## 2021-06-16 DIAGNOSIS — M25631 Stiffness of right wrist, not elsewhere classified: Secondary | ICD-10-CM | POA: Insufficient documentation

## 2021-06-16 DIAGNOSIS — M79641 Pain in right hand: Secondary | ICD-10-CM | POA: Insufficient documentation

## 2021-06-16 DIAGNOSIS — M6281 Muscle weakness (generalized): Secondary | ICD-10-CM | POA: Insufficient documentation

## 2021-06-16 DIAGNOSIS — M25641 Stiffness of right hand, not elsewhere classified: Secondary | ICD-10-CM | POA: Diagnosis present

## 2021-06-16 NOTE — Therapy (Signed)
[place] ?[address] ?[place], [place], [address] ?Phone: [phone]   Fax:  [phone] ? ?Occupational Therapy Treatment ? ?Patient Details  ?Name: Douglas Bird ?MRN: [id number] ?Date of Birth: 10-03-1965 ?Referring Provider (OT): Dr Roland Rack ? ? ?Encounter Date: 06/16/2021 ? ? OT End of Session - 06/16/21 0815   ? ? Visit Number 18   ? Number of Visits 20   ? Date for OT Re-Evaluation 06/20/21   ? OT Start Time 0815   ? OT Stop Time 0856   ? OT Time Calculation (min) 41 min   ? Activity Tolerance Patient tolerated treatment well   ? Behavior During Therapy [facility] for tasks assessed/performed   ? ?  ?  ? ?  ? ? ?Past Medical History:  ?Diagnosis Date  ? Diverticulitis 2011  ? GERD (gastroesophageal reflux disease)   ? Hyperlipidemia   ? Hypertension   ? controlled on meds  ? Hypothyroidism   ? Thyroid disease   ? ? ?Past Surgical History:  ?Procedure Laterality Date  ? CHOLECYSTECTOMY  June 2013  ? COLONOSCOPY WITH PROPOFOL N/A 04/28/2019  ? Procedure: COLONOSCOPY WITH PROPOFOL;  Surgeon: Lin Landsman, MD;  Location: [facility];  Service: Endoscopy;  Laterality: N/A;  priority 4  ? HERNIA REPAIR Bilateral   ? inguinal  ? REPAIR EXTENSOR TENDON Right 04/28/2020  ? Procedure: EXPLORATION OF RIGHT DORSAL HAND LACERATION WITH REPAIR OF INDEX EXTENSOR TENDON LACERATION;  Surgeon: Corky Mull, MD;  Location: [facility];  Service: Orthopedics;  Laterality: Right;  ? 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  ? 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 RESECTION OF HAND NEUROMA Right 03/23/2021  ? 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 UMBILICAL HERNIA REPAIR N/A 08/11/2019  ? Procedure: HERNIA REPAIR UMBILICAL ADULT;  Surgeon: Fredirick Maudlin, MD;  Location: [facility];  Service: General;  Laterality: N/A;  RNFA  ? ? ?There were no vitals filed for this visit. ? ? Subjective Assessment - 06/16/21 0815   ? ? Subjective  My hand good sense of single last worked in the yard yesterday putting mulch out, could use the pitchfork in the right-my wife with a wheelbarrow in the shallow I could do like 5 minutes on, resting 5 for about 4 to hours 5 hours-also mow the lawn the vibration bothered little bit   ? Pertinent History Pt had saw accident on 04/26/20 at [facility] and then Surgery by Dr Roland Rack on 04/28/20 - Primary repair of right EIP and index EDC tendons.2. Irrigation and debridement with primary repair of right dorsal hand laceration (approximately 10 cm in length). Pt was seen for OT and cont to had nerve pain -11/10/20 had traumatic Neuroma excision -NOW repeat evaluation status post a excision/reimplantation of traumatic neuroma involving the dorsal aspect of the right hand. Surgery was performed by Dr. Roland Rack on 03/23/2021. 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 The patient was doing well until he was hammering in some fence post and suffered a recurrence of his painful neuroma. The patient presents  last week to surgeon off-  reporting no injury or trauma affecting the right hand since his last surgery. He does report numbness and tingling especially in the second and third digits of the right hand. The patient take ibuprofen addition to occasional hydrocodone as needed for discomfort. Pain score today is a 5 out of 10- Refer to OT - pt to keep splint on until appt with OT this date - and to take off only for HEP and keep on untill follow up appt with surgeon 04/18/21   ? Patient Stated Goals Want to get the use of my R hand back to be able to work and take care  of the animals and yard /garden at home   ? Currently in Pain? No/denies   ? ?  ?  ? ?  ? ? ? ? ? ? ? ? ? ? ?Coming in pain free  -no pain with wrist AROM in all planes - and resistance  ?Fisting and opposition no pain - tight fist with 2nd and 3rd digit - pull with light  burn  when doing fist with wrist into flexion ?Also a slight burn over incision with composite wrist and digit extension with light weightbearing on table  ?with Compression glove on better ?  ?  Patient report he was able to be do some light yard work [employer] and rake outside for about 4 -5 hours yesterday putting mulch out.  Worked 5-10 min at time , test 5 and cont.  Also used a riding lawnmower.  Used Isotoner glove and Benik neoprene for wrist that makes it more comfortable.  Vibration and bouncing of lawnmower did bother her little bit.  Patient Pain under 2/10  ?cont with Isotoner glove for compression over dorsal hand as needed  and Benik as needed for wrist  ? ?Pt can start using  bilateral hands in 15-20 lbs  around the house and farm  ?But NO  pounding , hitting,   forceful  movement- vibration tools  with impact ?  ?   ?  ?  ?Cont table slides for wrist extention - 20 reps - light and slight pull  ?Wall push ups and weight bearing thru palm on wall - 30 sec pain free- can repeat 3 x  ?And wall pushups if need can use Benik  ?  ?Cont 2-3 lbs  sup/pro, RD, UD  20 reps - pain free  ? 20 reps -2 sets ?2 x day -but keep pain 0-1/110 ?Change position for radial ulnar deviation of wrist over edge of table with 2 pound weight 2 x10 reps ?Had increased pain with 3 pounds ?Simulating starting some tapping with 16 ounce hammer, large screwdriver on padded towel and putty.  Patient was able to do 30 seconds at a time prior to onset of some discomfort.  Added 3 pounds with light tapping into a padded towel able to do 30 seconds prior to onset of symptoms. ?Addressing tapping with a heavier weight increase-preparing for vibration at a later  stage ?  ?med teal putty  -  but keep it ball - 2  x 10-reps   ?twisting away and towards him 12 -15 reps ?  ?Simulated pulling  to start weed eater - upgrade to silver Thera-Band inbetween 3rd  and 4th - could do  ?Patient needed verbal cueing 50% to keep wrist neutral-had no increase in discomfort for 10 reps  ?2 x 10 reps symptom-free ?  ?Patient can continue with doing  10-15 pounds for his pulleys at home using bilateral hands  ?Added trunk rotation with chest press 5 pounds  ?Bilateral  2  x 10 reps  ?problem areas for patient at the moment ?Weightbearing through palm and 80 to 90 degrees ,table slides, wall push-ups and weightbearing through palm prior to catch and throw 1 kg and 2 kg ball-throwing her right hand catching with bilateral could do 30 seconds patient to do at home basketball against wall, dribbling to get comfortably in the 70 to 90 degree extension range ?  ?Add rubber band for abduction abduction of digits tolerating well today ?  ?  ?Biodex able to do 15-20 lbs pull, push and lift - this week ? Throw and catch weighted ball  1 -2 kg ball on rebounder - able to do 30  sec increase symptoms with catch - ? Pt to do basketball at home - on wall and dribbling and shooting - 30-45 sec at time  had some irritation with throw both hands chest and catch  ? Yard and housework , need to be careful with wheelbarrow pitchfork and shovel-reinforce no impact ?No pounding to keep pain under 2/10 ?Weight to be under 15-20 pounds for bilateral upper extremity use on forearm ?Increase time of use gradually   ?Cont to Assess how many sets or reps able to do without symptoms at home ?  ?  ?  ?  ?  ? ? ? ? ? ? ? ? ? ? ? ? OT Education - 06/16/21 0815   ? ? Education Details decrease pain and changes to HEP   ? Person(s) Educated Patient   ? Methods Explanation;Demonstration;Tactile cues;Verbal cues;Handout   ? Comprehension Verbal cues required;Returned demonstration;Verbalized understanding   ? ?  ?  ? ?  ? ? ? OT  Short Term Goals - 04/11/21 1605   ? ?  ? OT SHORT TERM GOAL #1  ? Title Pt to be independent in HEP to tolerate desentitization of different textures/gentle scar massage and full extention of digits

## 2021-06-21 ENCOUNTER — Ambulatory Visit: Payer: PRIVATE HEALTH INSURANCE | Attending: Surgery | Admitting: Occupational Therapy

## 2021-06-21 DIAGNOSIS — M25631 Stiffness of right wrist, not elsewhere classified: Secondary | ICD-10-CM | POA: Diagnosis present

## 2021-06-21 DIAGNOSIS — M79641 Pain in right hand: Secondary | ICD-10-CM | POA: Diagnosis present

## 2021-06-21 DIAGNOSIS — M6281 Muscle weakness (generalized): Secondary | ICD-10-CM

## 2021-06-21 DIAGNOSIS — L905 Scar conditions and fibrosis of skin: Secondary | ICD-10-CM

## 2021-06-21 DIAGNOSIS — M25641 Stiffness of right hand, not elsewhere classified: Secondary | ICD-10-CM

## 2021-06-21 NOTE — Therapy (Signed)
?[facility] ?[address] ?[place], [place], [address] ?Phone: [phone]   Fax:  [phone] ? ?Occupational Therapy Treatment ? ?Patient Details  ?Name: Douglas Bird ?MRN: [id number] ?Date of Birth: 03/02/1966 ?Referring Provider (OT): Dr Roland Rack ? ? ?Encounter Date: 06/21/2021 ? ? OT End of Session - 06/21/21 0857   ? ? Visit Number 19   ? Number of Visits 20   ? Date for OT Re-Evaluation 06/20/21   ? OT Start Time [phone]   ? OT Stop Time 3007   ? OT Time Calculation (min) 47 min   ? Activity Tolerance Patient tolerated treatment well   ? Behavior During Therapy [facility] for tasks assessed/performed   ? ?  ?  ? ?  ? ? ?Past Medical History:  ?Diagnosis Date  ? Diverticulitis 2011  ? GERD (gastroesophageal reflux disease)   ? Hyperlipidemia   ? Hypertension   ? controlled on meds  ? Hypothyroidism   ? Thyroid disease   ? ? ?Past Surgical History:  ?Procedure Laterality Date  ? CHOLECYSTECTOMY  June 2013  ? COLONOSCOPY WITH PROPOFOL N/A 04/28/2019  ? Procedure: COLONOSCOPY WITH PROPOFOL;  Surgeon: Lin Landsman, MD;  Location: [facility];  Service: Endoscopy;  Laterality: N/A;  priority 4  ? HERNIA REPAIR Bilateral   ? inguinal  ? REPAIR EXTENSOR TENDON Right 04/28/2020  ? Procedure: EXPLORATION OF RIGHT DORSAL HAND LACERATION WITH REPAIR OF INDEX EXTENSOR TENDON LACERATION;  Surgeon: Corky Mull, MD;  Location: [facility];  Service: Orthopedics;  Laterality: Right;  ? 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  ? 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 RESECTION OF HAND NEUROMA Right 03/23/2021  ? 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 UMBILICAL HERNIA REPAIR N/A 08/11/2019  ? Procedure: HERNIA REPAIR UMBILICAL ADULT;  Surgeon: Fredirick Maudlin, MD;  Location: [facility];  Service: General;  Laterality: N/A;  RNFA  ? ? ?There were no vitals filed for this visit. ? ? Subjective Assessment - 06/21/21 0857   ? ? Subjective  I did the pulling of my band Sunday after my stretches and had soreness since then - did some yardwork - sweeping, raking can do 10 min at time and rest- since then the hammering, tapping bothering  me little more than last week   ? Pertinent History Pt had saw accident on 04/26/20 at [facility] and then Surgery by Dr Roland Rack on 04/28/20 - Primary repair of right EIP and index EDC tendons.2. Irrigation and debridement with primary repair of right dorsal hand laceration (approximately 10 cm in length). Pt was seen for OT and cont to had nerve pain -11/10/20 had traumatic Neuroma excision -NOW repeat evaluation status post a excision/reimplantation of traumatic neuroma involving the dorsal aspect of the right hand. Surgery was performed by Dr. Roland Rack on 03/23/2021. 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 The patient was doing well until he was hammering in some fence post and suffered a recurrence of his painful neuroma. The patient presents last week to surgeon off-  reporting no injury  or trauma affecting the right hand since his last surgery. He does report numbness and tingling especially in the second and third digits of the right hand. The patient take ibuprofen addition to occasional hydrocodone as needed for discomfort. Pain score today is a 5 out of 10- Refer to OT - pt to keep splint on until appt with OT this date - and to take off only for HEP and keep on untill follow up appt with surgeon 04/18/21   ? Patient Stated Goals Want to get the use of my R hand back to be able to work and take care of the animals and yard /garden at home   ? Currently  in Pain? Yes   ? Pain Score 1    ? Pain Location Hand   ? Pain Orientation Right   ? Pain Descriptors / Indicators Burning;Tightness   ? Pain Type Surgical pain   ? ?  ?  ? ?  ? ? ? ? ? ? ? ? ? ? ? ? ? ? ? OT Treatments/Exercises (OP) - 06/21/21 0001   ? ?  ? RUE Fluidotherapy  ? Number Minutes Fluidotherapy 10 Minutes   ? RUE Fluidotherapy Location Hand;Wrist   ? Comments cycles for decrease soreness   ? ?  ?  ? ?  ? ?Coming in soreness 1-2/10 over dorsal hand - increase stretch with wrist  flexion and making tight fist   ?Fisting tight fist with 2nd and 3rd digit  with wrist flexion- add stretch for composite with elbow to side - 5 reps hold 3 sec  ?- pull with light  burn  when doing fist with wrist into flexion ?Also a slight burn over incision with composite wrist and digit extension with light weightbearing on table  ? FOCUS ON END RANGE ?with Compression glove on better ?  ?  Patient report he was able to be do some light yard work [employer] and rake outside for about 4 -5 hours yesterday putting mulch out.  Worked 5-10 min at time , test 5 and cont.  Also used a riding lawnmower.  Used Isotoner glove and Benik neoprene for wrist that makes it more comfortable.  Vibration and bouncing of lawnmower did bother her little bit.  Patient Pain under 2/10  ?cont with Isotoner glove for compression over dorsal hand as needed  and Benik as needed for wrist  ?  ?Pt can start using  bilateral hands in 15-20 lbs  around the house and farm  ?But NO  pounding , hitting,   forceful  movement- vibration tools  with impact ?  ?   ?  ?  ?Cont table slides for wrist extention - 20 reps - light and slight pull  ?Wall push ups and weight bearing thru palm on wall - 30 sec pain free- can repeat 3 x  ?And wall pushups if need can use Benik  ?  ?Cont 2-3 lbs  sup/pro, RD, UD  20 reps - pain free  ? 20 reps -2 sets ?2 x day -but keep pain 0-1/110 ?Change position for radial ulnar deviation of wrist over edge of table with 2  pound weight 2 x10 reps ?Had increased pain with 3 pounds ?Simulating starting some tapping with 16 ounce hammer, large screwdriver on padded towel and putty.  Patient was able to do 30 seconds at a time prior to onset of some discomfort.  Added 3 pounds with light tapping into a padded towel able to  do 30 seconds prior to onset of symptoms. ?Addressing tapping with a heavier weight increase-preparing for vibration at a later stage ?  ?med teal putty  -  but keep it ball - 2  x 10-reps   ?twisting away and towards him 12 -15 reps ?  ?Simulated pulling  to start weed eater - upgrade to silver Thera-Band inbetween 3rd  and 4th - could do  ?Patient needed verbal cueing 50% to keep wrist neutral-had no increase in discomfort for 10 reps  ?2 x 10 reps symptom-free ?  ?Patient can continue with doing 10-15 pounds for his pulleys at home using bilateral hands  ?Added trunk rotation with chest press 5 pounds  ?Bilateral  2  x 10 reps  ?problem areas for patient at the moment ?Weightbearing through palm and 80 to 90 degrees ,table slides, wall push-ups and weightbearing through palm prior to catch and throw 1 kg and 2 kg ball-throwing her right hand catching with bilateral could do 30 seconds patient to do at home basketball against wall, dribbling to get comfortably in the 70 to 90 degree extension range ?  ?Add rubber band for abduction abduction of digits tolerating well today ?  ?  ?Biodex able to do 15-20 lbs pull, push and lift - this week ? Throw and catch weighted ball  1 -2 kg ball on rebounder - able to do 30  sec increase symptoms with catch - ? Pt to do basketball at home - on wall and dribbling and shooting - 30-45 sec at time  had some irritation with throw both hands chest and catch  ? Yard and housework , need to be careful with wheelbarrow pitchfork and shovel-reinforce no impact ?No pounding to keep pain under 2/10 ?Weight to be under 15-20 pounds for bilateral upper extremity use on forearm ?Increase time  of use gradually   ?Cont to Assess how many sets or reps able to do without symptoms at home ?  ?  ?  ?  ?  ?  ? ? ? ? ? ? ? OT Education - 06/21/21 0857   ? ? Education Details decrease pain and changes

## 2021-06-24 ENCOUNTER — Ambulatory Visit: Payer: PRIVATE HEALTH INSURANCE | Admitting: Occupational Therapy

## 2021-06-24 DIAGNOSIS — M25641 Stiffness of right hand, not elsewhere classified: Secondary | ICD-10-CM

## 2021-06-24 DIAGNOSIS — M25631 Stiffness of right wrist, not elsewhere classified: Secondary | ICD-10-CM

## 2021-06-24 DIAGNOSIS — M79641 Pain in right hand: Secondary | ICD-10-CM

## 2021-06-24 DIAGNOSIS — L905 Scar conditions and fibrosis of skin: Secondary | ICD-10-CM | POA: Diagnosis not present

## 2021-06-24 DIAGNOSIS — M6281 Muscle weakness (generalized): Secondary | ICD-10-CM

## 2021-06-24 NOTE — Therapy (Signed)
[place] ?[facility] ?[address] ?[place], [place], [address] ?Phone: [phone]   Fax:  [phone] ? ?Occupational Therapy Treatment ? ?Patient Details  ?Name: Douglas Bird ?MRN: [id number] ?Date of Birth: [birth year] ?Referring Provider (OT): Dr Roland Rack ? ? ?Encounter Date: 06/24/2021 ? ? OT End of Session - 06/24/21 0828   ? ? Visit Number 20   ? Number of Visits 26   ? Date for OT Re-Evaluation 08/05/21   ? OT Start Time [phone]   ? OT Stop Time 0915   ? OT Time Calculation (min) 46 min   ? Activity Tolerance Patient tolerated treatment well   ? Behavior During Therapy [facility] for tasks assessed/performed   ? ?  ?  ? ?  ? ? ?Past Medical History:  ?Diagnosis Date  ? Diverticulitis 2011  ? GERD (gastroesophageal reflux disease)   ? Hyperlipidemia   ? Hypertension   ? controlled on meds  ? Hypothyroidism   ? Thyroid disease   ? ? ?Past Surgical History:  ?Procedure Laterality Date  ? CHOLECYSTECTOMY  June 2013  ? COLONOSCOPY WITH PROPOFOL N/A 04/28/2019  ? Procedure: COLONOSCOPY WITH PROPOFOL;  Surgeon: Lin Landsman, MD;  Location: [place];  Service: Endoscopy;  Laterality: N/A;  priority 4  ? HERNIA REPAIR Bilateral   ? inguinal  ? REPAIR EXTENSOR TENDON Right 04/28/2020  ? Procedure: EXPLORATION OF RIGHT DORSAL HAND LACERATION WITH REPAIR OF INDEX EXTENSOR TENDON LACERATION;  Surgeon: Corky Mull, MD;  Location: [facility];  Service: Orthopedics;  Laterality: Right;  ? 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  ? 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 RESECTION OF HAND NEUROMA Right 03/23/2021  ? 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 UMBILICAL HERNIA REPAIR N/A 08/11/2019  ? Procedure: HERNIA REPAIR UMBILICAL ADULT;  Surgeon: Fredirick Maudlin, MD;  Location: [facility];  Service: General;  Laterality: N/A;  RNFA  ? ? ?There were no vitals filed for this visit. ? ? Subjective Assessment - 06/24/21 0828   ? ? Subjective  No pain in my hand or wrist today coming in able to rubb and tap over her back of my my  hand  today.  I done like you told me I can rake some, sweep , do the pitchfork with  light hay. Riding lawnmower but the bumpiness bother it - did not do push one yet or weedeater.   I did some mulch the other week and did okay - wife did the wheelbarrow.  I used small shavol the other day little but that  bothers me. And the drill I can do like here with you but not the impact yet   ? Pertinent History Pt had saw accident on 04/26/20 at [facility] and then Surgery by Dr Roland Rack on 04/28/20 - Primary repair of right EIP and index EDC tendons.2. Irrigation and debridement with primary repair of right dorsal hand laceration (approximately 10 cm in length). Pt was seen for OT and cont to had nerve pain -11/10/20 had traumatic Neuroma excision -NOW repeat evaluation status post a excision/reimplantation of traumatic neuroma involving the dorsal aspect of  the right hand. Surgery was performed by Dr. Roland Rack on 03/23/2021. 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 The patient was doing well until he was hammering in some fence post and suffered a recurrence of his painful neuroma. The patient presents last week to surgeon off-  reporting no injury or trauma affecting the right hand since his last surgery. He does report numbness and tingling especially in the second and third digits of the right hand. The patient take ibuprofen addition to occasional hydrocodone as needed for discomfort. Pain score today is a 5 out of 10- Refer to OT - pt to keep splint on until appt with OT  this date - and to take off only for HEP and keep on untill follow up appt with surgeon 04/18/21   ? Patient Stated Goals Want to get the use of my R hand back to be able to work and take care of the animals and yard /garden at home   ? Currently in Pain? No/denies   ? Pain Score --   coming in  ? ?  ?  ? ?  ? ? ? ? ? [facility] OT Assessment - 06/24/21 0001   ? ?  ? AROM  ? Right Wrist Extension 60 Degrees   ? Right Wrist Flexion 90 Degrees   80 composite fist  ?  ? Strength  ? Right Hand Grip (lbs) 81   ? Right Hand Lateral Pinch 30 lbs   ? Right Hand 3 Point Pinch 23 lbs   ? Left Hand Grip (lbs) 90   ? Left Hand Lateral Pinch 30 lbs   ? Left Hand 3 Point Pinch 21 lbs   ?  ? Right Hand AROM  ? R Index  MCP 0-90 75 Degrees   ? R Index PIP 0-100 100 Degrees   ? R Long  MCP 0-90 75 Degrees   ? R Long PIP 0-100 100 Degrees   ? R Ring  MCP 0-90 80 Degrees   ? R Ring PIP 0-100 100 Degrees   ? R Little  MCP 0-90 85 Degrees   ? R Little PIP 0-100 90 Degrees   ? ?  ?  ? ?  ? ? ? ? ? ? ? ? ? ? ? OT Treatments/Exercises (OP) - 06/24/21 0001   ? ?  ? RUE Fluidotherapy  ? Number Minutes Fluidotherapy 5 Minutes   ? RUE Fluidotherapy Location Hand;Wrist   ? Comments prior to composite stretch   ? ?  ?  ? ?  ? ? ?  Patient arrived this date with less soreness ?Report no pain today making a fist and coming and driving.  Patient was able to tolerate massage and tapping over her incision this date dorsal hand. ?With composite flexion stretch for wrist  flexion and fist stretch increased to 3/10 ?After fluidotherapy repeat stretch with less discomfort 1 out of 10 ?Reviewed with patient and add stretch for composite  digits and wrist  with elbow to side - 5 reps hold 10 seconds sec - with wrist in flexion and neutral ?Also a slight burn over incision with composite wrist and digit extension with light weightbearing on table/ and wall add today =  ? FOCUS ON END RANGE ?with Compression glove on  if better ?  ?   ?  ?Pt using  bilateral hands  in 15-20 lbs  around the house and farm  ?  But NO  pounding , hitting,   forceful  movement- vibration tools  with impact that cause irritation of nerve ?  ?   ?  ?   ?Cont 3 lbs for wrist and forearm  ?Patient able to do radial ulnar deviation of wrist over edge of table with 3 pounds pound today  ?Simulating starting some tapping with 16 ounce hammer, large screwdriver on padded towel and putty.  Could increase that to vertical surface this date with no pain.  ?Patient can try this weekend nail in light with tapping ? Addressing tapping with a heavier weight increase as well as speed of tapping-preparing for vibration at a later stage ?  ?med teal putty  -  but keep it ball - 2  x 10-reps   ?twisting away and towards him 12 -15 reps ?Patient still gets irritated with repetitive three-point pinch holding off ?  ?Simulated pulling  to start weed eater - upgrade to silver Thera-Band inbetween 3rd  and 4th - could do  ?Patient needed verbal cueing 50% to keep wrist neutral-had no increase in discomfort for 10 reps  ?2 x 10 reps symptom-free ?Cannot do between second and third digit yet. ? ?Patient can continue with doing 15-20 pounds for his pulleys at home using bilateral hands  ?Added trunk rotation with chest press 5 pounds  ?Bilateral  2  x 10 reps  ?Done better with trunk rotation and chest press today with less pain.   ? ?Weightbearing through palm and 80 to 90 degrees ,table slides, wall push-ups and weightbearing through palm prior to catch and throw 1 kg - throwing her right hand catching with bilateral could do 30 seconds ?Patient could not do 2 kg ball today had increase nerve irritation ? ? patient is doing at home basketball against wall, dribbling to get comfortably in the 70 to 90 degree extension range ?  ?Add rubber band for abduction abduction of digits tolerating well today ?  ?  ?Biodex able to do 15-20 lbs pull, push and lift - this week ?  Yard and housework , need to be careful with wheelbarrow  pitchfork and shovel-reinforce no impact ?No pounding to keep pain under 2/10 ?Can do small little shovel and ground and soft soil ?Increase time of use gradually   ?Cont to Assess how many sets or reps

## 2021-06-28 ENCOUNTER — Ambulatory Visit: Payer: PRIVATE HEALTH INSURANCE | Admitting: Occupational Therapy

## 2021-06-28 ENCOUNTER — Encounter: Payer: Self-pay | Admitting: Occupational Therapy

## 2021-06-28 DIAGNOSIS — M25641 Stiffness of right hand, not elsewhere classified: Secondary | ICD-10-CM

## 2021-06-28 DIAGNOSIS — M25631 Stiffness of right wrist, not elsewhere classified: Secondary | ICD-10-CM

## 2021-06-28 DIAGNOSIS — M79641 Pain in right hand: Secondary | ICD-10-CM

## 2021-06-28 DIAGNOSIS — L905 Scar conditions and fibrosis of skin: Secondary | ICD-10-CM | POA: Diagnosis not present

## 2021-06-28 DIAGNOSIS — M6281 Muscle weakness (generalized): Secondary | ICD-10-CM

## 2021-06-28 NOTE — Therapy (Signed)
[place] ?[facility] ?[address] ?[place], [place], [address] ?Phone: [phone]   Fax:  [phone] ? ?Occupational Therapy Treatment ? ?Patient Details  ?Name: Douglas Bird ?MRN: [id number] ?Date of Birth: [birth year] ?Referring Provider (OT): Dr Roland Rack ? ? ?Encounter Date: 06/28/2021 ? ? OT End of Session - 06/28/21 0816   ? ? Visit Number 21   ? Number of Visits 26   ? Date for OT Re-Evaluation 08/05/21   ? OT Start Time [phone]   ? OT Stop Time 0900   ? OT Time Calculation (min) 44 min   ? Activity Tolerance Patient tolerated treatment well   ? Behavior During Therapy [facility] for tasks assessed/performed   ? ?  ?  ? ?  ? ? ?Past Medical History:  ?Diagnosis Date  ? Diverticulitis 2011  ? GERD (gastroesophageal reflux disease)   ? Hyperlipidemia   ? Hypertension   ? controlled on meds  ? Hypothyroidism   ? Thyroid disease   ? ? ?Past Surgical History:  ?Procedure Laterality Date  ? CHOLECYSTECTOMY  June 2013  ? COLONOSCOPY WITH PROPOFOL N/A 04/28/2019  ? Procedure: COLONOSCOPY WITH PROPOFOL;  Surgeon: Lin Landsman, MD;  Location: [facility];  Service: Endoscopy;  Laterality: N/A;  priority 4  ? HERNIA REPAIR Bilateral   ? inguinal  ? REPAIR EXTENSOR TENDON Right 04/28/2020  ? Procedure: EXPLORATION OF RIGHT DORSAL HAND LACERATION WITH REPAIR OF INDEX EXTENSOR TENDON LACERATION;  Surgeon: Corky Mull, MD;  Location: [facility];  Service: Orthopedics;  Laterality: Right;  ? 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  ? 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 RESECTION OF HAND NEUROMA Right 03/23/2021  ? 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 UMBILICAL HERNIA REPAIR N/A 08/11/2019  ? Procedure: HERNIA REPAIR UMBILICAL ADULT;  Surgeon: Fredirick Maudlin, MD;  Location: [facility];  Service: General;  Laterality: N/A;  RNFA  ? ? ?There were no vitals filed for this visit. ? ? Subjective Assessment - 06/28/21 0817   ? ? Subjective  I seen Dr. Roland Rack yesterday and he was very happy with my progress.  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  My hand feels good this morning but is 0 out of 10 pain   ? Pertinent History Pt had saw accident on 04/26/20 at [facility] and then Surgery by Dr Roland Rack on 04/28/20 - Primary repair of right EIP and index EDC tendons.2. Irrigation and debridement with primary repair of right dorsal hand laceration (approximately 10 cm in length). Pt was seen for OT and cont to had nerve pain -11/10/20 had traumatic Neuroma excision -NOW repeat evaluation status post a excision/reimplantation of traumatic neuroma involving the dorsal aspect of the right hand. Surgery was performed by Dr. Roland Rack on 03/23/2021. 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 The patient was doing well until he was hammering in some fence post and suffered a recurrence of his painful neuroma. The patient presents last week to surgeon  off-  reporting no injury or trauma affecting the right hand since his last surgery. He does report numbness and tingling especially in the second and third digits of the right hand. The patient take ibuprofen addition to occasional hydrocodone as needed for discomfort. Pain score today is a 5 out of 10- Refer to OT - pt to keep splint on until appt with OT this date - and to take off only for HEP and keep on untill follow up appt with surgeon 04/18/21   ? Patient Stated Goals Want to get the use of my R hand back to be able to work and take care of the animals and yard /garden at home   ? Currently in Pain?  No/denies   ? ?  ?  ? ?  ? ? ? ?  Patient had follow-up with surgeon yesterday-was happy with the progress.  Patient to continue 1 time a week for 6 weeks until follow-up with orthopedics again patient arrives with 0 out of 10 pain. ? ? ? ? ? ? ? ? ? ? ? OT Treatments/Exercises (OP) - 06/28/21 0001   ? ?  ? RUE Fluidotherapy  ? Number Minutes Fluidotherapy 8 Minutes   ? RUE Fluidotherapy Location Hand;Wrist   ? Comments prior to ROM   ? ?  ?  ? ?  ? ?  Report no pain today making a fist and coming and driving.  Patient was able to tolerate massage and tapping over her incision this date dorsal hand.  Open hand and close fist. ?Done high-five with patient as well as clapping hands tolerating well nerve not irritating.  Educated patient that can speed up his hand clapping to get closer to a vibration speed. ? ?Assess composite flexion stretch for wrist  flexion and fist stretch increased to 3/10 ?After fluidotherapy repeat stretch with less discomfort 1 out of 10 ?Change composite flexion stretch to patient doing 5 reps hold 3 to 5 seconds intrinsic stretch, followed by MC flexion at 90 degrees-followed by composite flexion to palm 5 reps hold for 3 to 5 seconds. ?Patient pain increased to 2-3/10.  Nerve continues to get irritated at times and have increased swelling and redness. ?Done ice to decrease pain to 0/10 ?Reviewed with patient and change stretch for composite  digits to above stretch but keeping pain under 2/10.  Only doing 2 times a day.  Do not add wrist if pain increases focus on composite flexion of digits first before wrist flexion.   ? ? ?Also a slight burn over incision with composite wrist and digit extension with light weightbearing on table/ and wall continue with ? FOCUS ON END RANGE ?with Compression glove on  if better ?  ?   ?Held off on strengthening this date patient to focus on decreasing pain to 0 out of 10 today and start back home program tomorrow ?Pt using  bilateral hands in 15-20 lbs   around the house and farm  ?But NO  pounding , hitting,   forceful  movement- vibration tools  with impact that cause irritation of nerve ?  ?   ?  ?   ?Cont 3 lbs for wrist and forearm  ?Patient able to do radial ulnar deviation of wrist over edge of table with 3 pounds pound today  ?Simulating starting some tapping with 16 ounce hammer, large screwdriver on padded towel and putty.  Could increase that to vertical surface this date with no pain.  ?Patient can try  this weekend nail in light with tapping ? Addressing tapping with a heavier weight increase as well as speed of tapping-preparing for vibration at a later stage ?  ?med teal putty  -  but keep it ball - 2  x 10-reps   ?twisting away and towards him 12 -15 reps ?Patient still gets irritated with repetitive three-point pinch holding off ?  ?Simulated pulling  to start weed eater - upgrade to silver Thera-Band inbetween 3rd  and 4th - could do  ?Patient needed verbal cueing 50% to keep wrist neutral-had no increase in discomfort for 10 reps  ?2 x 10 reps symptom-free ?Cannot do between second and third digit yet. ?  ?Patient can continue with doing 15-20 pounds for his pulleys at home using bilateral hands  ?Added trunk rotation with chest press 5 pounds  ?Bilateral  2  x 10 reps  ?Done better with trunk rotation and chest press today with less pain.   ?  ?Weightbearing through palm and 80 to 90 degrees ,table slides, wall push-ups and weightbearing through palm prior to catch and throw 1 kg - throwing her right hand catching with bilateral could do 30 seconds ?Patient could not do 2 kg ball today had increase nerve irritation ?  ? patient is doing at home basketball against wall, dribbling to get comfortably in the 70 to 90 degree extension range ?  ?Add rubber band for abduction abduction of digits tolerating well today ?  ? ? ? ? ? ? ? OT Education - 06/28/21 0816   ? ? Education Details changes to HEP   ? Person(s) Educated Patient   ? Methods  Explanation;Demonstration;Tactile cues;Verbal cues;Handout   ? Comprehension Verbal cues required;Returned demonstration;Verbalized understanding   ? ?  ?  ? ?  ? ? ? OT Short Term Goals - 04/11/21 1605   ? ?  ?

## 2021-07-05 ENCOUNTER — Ambulatory Visit: Payer: PRIVATE HEALTH INSURANCE | Attending: Student | Admitting: Occupational Therapy

## 2021-07-05 DIAGNOSIS — M25631 Stiffness of right wrist, not elsewhere classified: Secondary | ICD-10-CM | POA: Insufficient documentation

## 2021-07-05 DIAGNOSIS — L905 Scar conditions and fibrosis of skin: Secondary | ICD-10-CM | POA: Insufficient documentation

## 2021-07-05 DIAGNOSIS — M25641 Stiffness of right hand, not elsewhere classified: Secondary | ICD-10-CM | POA: Insufficient documentation

## 2021-07-05 DIAGNOSIS — M6281 Muscle weakness (generalized): Secondary | ICD-10-CM | POA: Diagnosis present

## 2021-07-05 DIAGNOSIS — M79641 Pain in right hand: Secondary | ICD-10-CM | POA: Insufficient documentation

## 2021-07-05 NOTE — Therapy (Signed)
[place] [facility] ?[address] ?[place], [place], [address] ?Phone: [phone]   Fax:  [phone] ? ?Occupational Therapy Treatment ? ?Patient Details  ?Name: Douglas Bird ?MRN: [id number] ?Date of Birth: [birth year] ?Referring Provider (OT): Dr Roland Rack ? ? ?Encounter Date: 07/05/2021 ? ? OT End of Session - 07/05/21 1030   ? ? Visit Number 22   ? Number of Visits 26   ? Date for OT Re-Evaluation 08/05/21   ? OT Start Time [phone]   ? OT Stop Time [phone]   ? OT Time Calculation (min) 50 min   ? Activity Tolerance Patient tolerated treatment well   ? Behavior During Therapy [facility] for tasks assessed/performed   ? ?  ?  ? ?  ? ? ?Past Medical History:  ?Diagnosis Date  ? Diverticulitis 2011  ? GERD (gastroesophageal reflux disease)   ? Hyperlipidemia   ? Hypertension   ? controlled on meds  ? Hypothyroidism   ? Thyroid disease   ? ? ?Past Surgical History:  ?Procedure Laterality Date  ? CHOLECYSTECTOMY  June 2013  ? COLONOSCOPY WITH PROPOFOL N/A 04/28/2019  ? Procedure: COLONOSCOPY WITH PROPOFOL;  Surgeon: Lin Landsman, MD;  Location: [place];  Service: Endoscopy;  Laterality: N/A;  priority 4  ? HERNIA REPAIR Bilateral   ? inguinal  ? REPAIR EXTENSOR TENDON Right 04/28/2020  ? Procedure: EXPLORATION OF RIGHT DORSAL HAND LACERATION WITH REPAIR OF INDEX EXTENSOR TENDON LACERATION;  Surgeon: Corky Mull, MD;  Location: [facility];  Service: Orthopedics;  Laterality: Right;  ? 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  ? 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 RESECTION OF HAND NEUROMA Right 03/23/2021  ? 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 UMBILICAL HERNIA REPAIR N/A 08/11/2019  ? Procedure: HERNIA REPAIR UMBILICAL ADULT;  Surgeon: Fredirick Maudlin, MD;  Location: [facility];  Service: General;  Laterality: N/A;  RNFA  ? ? ?There were no vitals filed for this visit. ? ? Subjective Assessment - 07/05/21 1029   ? ? Subjective  My sinuses is bothering me so bad.  Seeing the doctor at 11:00.  I was yesterday the whole day out working outside.  Was able to rake and sweep.  Doing the lawnmower.  Vibration bothers me still.  Could not do the weedeater starting it or using it.  Tried to hammer a nail and the light would I could do it but the last little bit could not.  We will doing treatment on my small dose-given them spring shots I could do the smaller goats pulling on their collars but not the bigger goats.  Also hard time with the pitchfork of the light high I can do, but not heavier.   ? Pertinent History Pt had saw accident on 04/26/20 at [facility] and then Surgery by Dr Roland Rack on 04/28/20 - Primary repair of right EIP and index EDC tendons.2. Irrigation and debridement with primary repair of right dorsal hand laceration (approximately 10 cm in length). Pt was seen for OT and cont to had nerve pain -11/10/20 had traumatic Neuroma excision -NOW repeat evaluation status post a excision/reimplantation  of traumatic neuroma involving the dorsal aspect of the right hand. Surgery was performed by Dr. Roland Rack on 03/23/2021. 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 The patient was doing well until he was hammering in some fence post and suffered a recurrence of his painful neuroma. The patient presents last week to surgeon off-  reporting no injury or trauma affecting the right hand since his last surgery. He does report numbness and tingling especially in the second and third digits of the right hand. The patient take ibuprofen addition to occasional hydrocodone as needed for  discomfort. Pain score today is a 5 out of 10- Refer to OT - pt to keep splint on until appt with OT this date - and to take off only for HEP and keep on untill follow up appt with surgeon 04/18/21   ? Patient Stated Goals Want to get the use of my R hand back to be able to work and take care of the animals and yard /garden at home   ? Currently in Pain? No/denies   ? ?  ?  ? ?  ? ? ? ? ? Report no pain today making a fist and coming and driving.  Patient was able to tolerate massage and tapping over her incision this date dorsal hand.  Open hand and close fist. ?Done high-five with patient as well as clapping hands tolerating well nerve not irritating.  Educated patient that can speed up his hand clapping to get closer to a vibration speed. ?  ?Assess active composite flexion stretch for wrist  flexion and fist stretch increased to 3/10 but with 3 to 5 minutes of heat pull decreased to 1/10 ? ?Patient to do active composite stretch prior to adding passive stretch for composite  digits with a wrist flexion but keeping pain under 2/10.  Only doing 2 times a day.    ?  ?  ?Also a slight burn over incision with composite wrist and digit extension with light weightbearing on table/ and wall continue with ? FOCUS ON END RANGE ?with Compression glove on  if better ?  ?   ?   ?  ?   ?Was able to increase to 4 pounds for wrist and forearm over edge of table ?Radial/ ulnar deviation of wrist over edge of table as well as wrist extension flexion  ?Patient reported using his hands this past weekend with medicating his smaller goats as well as cutting the nails was not able to do larger goat pulling on its leash was too much resistance. ?Was able to do raking, sweeping and pitchfork.  But not able to do heavier stuff with pitchfork. ?Was not able to do weed eater or start weed eater.  Can do a lawnmower but vibration still bothersome. ? ?Hammering could do nail into soft wood but not getting it deeper in.  ?Electric drill still  impacted at the end bothering him ? ?  ?med teal putty  -  but keep it ball - 2  x 10-reps   ?twisting away and towards him 12 -15 reps ?Patient still gets irritated with repetitive three-point pinch holding off ?  ?Simulated pulling  to start weed eater -  silver  ?Thera-Band inbetween 3rd  and 4th - could do  ?Also did Silver band simulating pulling on leash for goat hand overhand as well as right hand by itself could do 3 x 1 minute ?  ?  Patient can continue with doing 15-20 pounds for his pulleys at home using bilateral hands  ?Added trunk rotation with chest press upgrade to 10 pounds that was hard with one hand right but able to do 10 reps ?Bilateral  2  x 10 reps  ?Done better with trunk rotation and chest press today   ?  ?Weightbearing through palm and 80 to 90 degrees ,table slides, wall push-ups and weightbearing through palm  ? catch and throw 1 kg - throwing her right hand catching with bilateral could 2 x 1 minute no issues ?Could also do today 2 kg ball for 1 minute catching and throwing with bilateral hands 1 minute no issues or pain less than 2/10 ? ? patient is doing at home basketball against wall, dribbling to get comfortably in the 70 to 90 degree extension range ?  ?  ?  ?  ?  ?  ? ? ? ? ? ? ? ? ? ? ? ? ? ? ? ? ? ? OT Education - 07/05/21 1030   ? ? Education Details Changes to home program and education of modification activities at home   ? Person(s) Educated Patient   ? Methods Explanation;Demonstration;Tactile cues;Verbal cues;Handout   ? Comprehension Verbal cues required;Returned demonstration;Verbalized understanding   ? ?  ?  ? ?  ? ? ? OT Short Term Goals - 04/11/21 1605   ? ?  ? OT SHORT TERM GOAL #1  ? Title Pt to be independent in HEP to tolerate desentitization of different textures/gentle scar massage and full extention of digits to donn gloves  with discomfort less than 2/10   ? Baseline at rest pain - burning 1-5/10 - numbness over dorsal 2nd and 3rd digits and in webspace-  cannot toleate touching of dorsal hand over 2nd and 3rd MC's and incision   ? Time 4   ? Period Weeks   ? Status New   ? Target Date 05/09/21   ?  ? OT SHORT TERM GOAL #2  ? Title R wrist AROM increase to WNL with

## 2021-07-12 ENCOUNTER — Ambulatory Visit: Payer: PRIVATE HEALTH INSURANCE | Admitting: Occupational Therapy

## 2021-07-12 DIAGNOSIS — M6281 Muscle weakness (generalized): Secondary | ICD-10-CM

## 2021-07-12 DIAGNOSIS — L905 Scar conditions and fibrosis of skin: Secondary | ICD-10-CM

## 2021-07-12 DIAGNOSIS — M79641 Pain in right hand: Secondary | ICD-10-CM

## 2021-07-12 DIAGNOSIS — M25641 Stiffness of right hand, not elsewhere classified: Secondary | ICD-10-CM

## 2021-07-12 DIAGNOSIS — M25631 Stiffness of right wrist, not elsewhere classified: Secondary | ICD-10-CM

## 2021-07-12 NOTE — Therapy (Signed)
[place] ?[facility] ?[address] ?[place], [place], [address] ?Phone: [phone]   Fax:  [phone] ? ?Occupational Therapy Treatment ? ?Patient Details  ?Name: Douglas Bird ?MRN: [id number] ?Date of Birth: [birth year] ?Referring Provider (OT): Dr Roland Rack ? ? ?Encounter Date: 07/12/2021 ? ? OT End of Session - 07/12/21 2979   ? ? Visit Number 23   ? Number of Visits 26   ? Date for OT Re-Evaluation 08/05/21   ? OT Start Time 0818   ? OT Stop Time 8921   ? OT Time Calculation (min) 56 min   ? Activity Tolerance Patient tolerated treatment well   ? Behavior During Therapy [facility] for tasks assessed/performed   ? ?  ?  ? ?  ? ? ?Past Medical History:  ?Diagnosis Date  ? Diverticulitis 2011  ? GERD (gastroesophageal reflux disease)   ? Hyperlipidemia   ? Hypertension   ? controlled on meds  ? Hypothyroidism   ? Thyroid disease   ? ? ?Past Surgical History:  ?Procedure Laterality Date  ? CHOLECYSTECTOMY  June 2013  ? COLONOSCOPY WITH PROPOFOL N/A 04/28/2019  ? Procedure: COLONOSCOPY WITH PROPOFOL;  Surgeon: Lin Landsman, MD;  Location: [facility];  Service: Endoscopy;  Laterality: N/A;  priority 4  ? HERNIA REPAIR Bilateral   ? inguinal  ? REPAIR EXTENSOR TENDON Right 04/28/2020  ? Procedure: EXPLORATION OF RIGHT DORSAL HAND LACERATION WITH REPAIR OF INDEX EXTENSOR TENDON LACERATION;  Surgeon: Corky Mull, MD;  Location: [facility];  Service: Orthopedics;  Laterality: Right;  ? 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  ? 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 RESECTION OF HAND NEUROMA Right 03/23/2021  ? 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 UMBILICAL HERNIA REPAIR N/A 08/11/2019  ? Procedure: HERNIA REPAIR UMBILICAL ADULT;  Surgeon: Fredirick Maudlin, MD;  Location: [facility];  Service: General;  Laterality: N/A;  RNFA  ? ? ?There were no vitals filed for this visit. ? ? Subjective Assessment - 07/12/21 0830   ? ? Subjective  We had great day yesterday - planted flowers and grill out - but I cannot do any pounding, vibration, jerking like weed eater, hammering - tried to use little shovel but ground was to hard   ? Pertinent History Pt had saw accident on 04/26/20 at [facility] and then Surgery by Dr Roland Rack on 04/28/20 - Primary repair of right EIP and index EDC tendons.2. Irrigation and debridement with primary repair of right dorsal hand laceration (approximately 10 cm in length). Pt was seen for OT and cont to had nerve pain -11/10/20 had traumatic Neuroma excision -NOW repeat evaluation status post a excision/reimplantation of traumatic neuroma involving the dorsal aspect of the right hand. Surgery was performed by Dr. Roland Rack on 03/23/2021. 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 The patient was doing well until he was hammering in some fence post and suffered a recurrence of his painful neuroma. The patient presents last week to surgeon off-  reporting no injury or trauma affecting the right hand since his last  surgery. He does report numbness and tingling especially in the second and third digits of the right hand. The patient take ibuprofen addition to occasional hydrocodone as needed for discomfort. Pain score today is a 5 out of 10- Refer to OT - pt to keep splint on until appt with OT this date - and to take off only for HEP and keep on untill follow up appt with surgeon 04/18/21   ? Patient Stated Goals Want to get the use of my R hand back to be able to work and take care of the animals and yard /garden at home   ? Currently in Pain? Yes   ? Pain Score 2    ? Pain  Location Hand   ? Pain Orientation Right   ? Pain Descriptors / Indicators Aching;Tightness;Burning   ? Pain Type Surgical pain   ? Pain Onset More than a month ago   ? ?  ?  ? ?  ? ? ? ? ? ? ? ? ? ?Patient has been up since 2:00 this morning with his couch getting out because of neighbor's tree falling on things.  Report hand about a 1-2/10 discomfort this morning did not do any contrast or home exercises this morning yet coming in. ? OT Treatments/Exercises (OP) - 07/12/21 0001   ? ?  ? Moist Heat Therapy  ? Number Minutes Moist Heat --   ? Moist Heat Location --   ?  ? RUE Contrast [place]  ? Time 8 minutes   ? Comments Start of care prior to composite flexion of second and third digits with composite flexion of wrist.   ? ?  ?  ? ?  ? ? ?Assess active composite flexion stretch for wrist  flexion and fist stretch increased to 3/10 but after contrast decreased to 1/10.   ?Patient active assisted and active  composite stretch for composite flexion of digits with a wrist flexion but keeping pain under 2/10.  Done prior to starting doing conditioning and strengthening in clinic. ? ? FOCUS ON END RANGE ?with Compression glove on  if better ?  ?   ?   ?  ?   ?This date done with patient on BTE strengthening gripping large screwdriver to 366 combining with repetitive wrist flexion and wrist extension doing each 120 seconds at 3 pounds.   ?Also done large to screwdriver with supination pronation at 1 pounds more discomfort with supination and pronation patient keeping with all BTE exercises pain under 3/10 pain.  120 seconds each way. ?Gripper on BTE 120 seconds at 30 pounds at 120 seconds patient had 3/10 discomfort with redness and changing color during session over dorsal hand. ? ?Provided patient with like a green and teal mix putty patient to do with a tight composite flexion ulnar radial deviation 2 sets of 12, wrist flexion extension with composite tight fist 2 sets of 12.   ?Add to home exercises also can do  supination with composite fist 12 reps.   ? ?Simulated pulling  to start weed eater -  silver  ?Thera-Band inbetween 3rd  and 4th - could do  ?Also did Silver band simulating pulling on leash for goat hand overhand as well as right hand by itself could do 3 x 1 minute ?  ?Patient can continue with doing 15-20 pounds for his pulleys at home using bilateral hands  ?trunk rotation with chest press upgrade to 10 pounds last time but was unable to do 10 pounds  today dropped down to 5 pounds-could do 8 reps but pain increased with redness and a burning feeling over nerve pain increased 3/10. ?Bilateral  2  x 10 reps at 5 pounds ?y   ?  ?  ?  ?  ?  ?  ? ? ? ? ? ? OT Education - 07/12/21 0833   ? ? Education Details Changes to home program and education of modification activities at home   ? Person(s) Educated Patient   ? Methods Explanation;Demonstration;Tactile cues;Verbal cues;Handout   ? Comprehension Verbal cues required;Returned demonstration;Verbalized understanding   ? ?  ?  ? ?  ? ? ? OT Short Term Goals - 04/11/21 1605   ? ?  ? OT SHORT TERM GOAL #1  ? Title Pt to be independent in HEP to tolerate desentitization of different textures/gentle scar massage and full extention of digits to donn gloves  with discomfort less than 2/10   ? Baseline at rest pain - burning 1-5/10 - numbness over dorsal 2nd and 3rd digits and in webspace- cannot toleate touching of dorsal hand over 2nd and 3rd MC's and incision   ? Time 4   ? Period Weeks   ? Status New   ? Target Date 05/09/21   ?  ? OT SHORT TERM GOAL #2  ? Title R wrist AROM increase to WNL without increase symptoms   ? Baseline pain over dorsal hand -increase pull - increase with wrist flexion with gravity   ? Time 3   ? Period Weeks   ? Status New   ? Target Date 05/02/21   ? ?  ?  ? ?  ? ? ? ? OT Long Term Goals - 04/11/21 1604   ? ?  ? OT LONG TERM GOAL #1  ? Title Pt R hand digits flexion increase for pt to touch palm to grip and release IADLs objects with pain less  than 2-3/10   ? Baseline in soft volar splint /cast - except with HEP, pain at rest 1-5/10 pain over dorsal hand   ? Time 5   ? Period Weeks   ? Status New   ? Target Date 05/16/21   ?  ? OT LONG TERM GOAL

## 2021-07-18 ENCOUNTER — Other Ambulatory Visit: Payer: Self-pay

## 2021-07-18 MED FILL — Hydrochlorothiazide Cap 12.5 MG: ORAL | 90 days supply | Qty: 90 | Fill #1 | Status: AC

## 2021-07-19 ENCOUNTER — Ambulatory Visit: Payer: PRIVATE HEALTH INSURANCE | Attending: Student | Admitting: Occupational Therapy

## 2021-07-19 DIAGNOSIS — M6281 Muscle weakness (generalized): Secondary | ICD-10-CM | POA: Diagnosis present

## 2021-07-19 DIAGNOSIS — M25641 Stiffness of right hand, not elsewhere classified: Secondary | ICD-10-CM | POA: Diagnosis present

## 2021-07-19 DIAGNOSIS — M79641 Pain in right hand: Secondary | ICD-10-CM | POA: Diagnosis present

## 2021-07-19 DIAGNOSIS — L905 Scar conditions and fibrosis of skin: Secondary | ICD-10-CM | POA: Insufficient documentation

## 2021-07-19 DIAGNOSIS — M25631 Stiffness of right wrist, not elsewhere classified: Secondary | ICD-10-CM | POA: Insufficient documentation

## 2021-07-19 NOTE — Therapy (Signed)
[facility] ?[address] ?[place], [place], [address] ?Phone: [phone]   Fax:  [phone] ? ?Occupational Therapy Treatment ? ?Patient Details  ?Name: Douglas Bird ?MRN: [id number] ?Date of Birth: 06-04-1965 ?Referring Provider (OT): Dr Roland Rack ? ? ?Encounter Date: 07/19/2021 ? ? OT End of Session - 07/19/21 0856   ? ? Visit Number 24   ? Number of Visits 26   ? Date for OT Re-Evaluation 08/05/21   ? OT Start Time [phone]   ? OT Stop Time 0933   ? OT Time Calculation (min) 38 min   ? Activity Tolerance Patient tolerated treatment well   ? Behavior During Therapy [facility] for tasks assessed/performed   ? ?  ?  ? ?  ? ? ?Past Medical History:  ?Diagnosis Date  ? Diverticulitis 2011  ? GERD (gastroesophageal reflux disease)   ? Hyperlipidemia   ? Hypertension   ? controlled on meds  ? Hypothyroidism   ? Thyroid disease   ? ? ?Past Surgical History:  ?Procedure Laterality Date  ? CHOLECYSTECTOMY  June 2013  ? COLONOSCOPY WITH PROPOFOL N/A 04/28/2019  ? Procedure: COLONOSCOPY WITH PROPOFOL;  Surgeon: Lin Landsman, MD;  Location: [facility];  Service: Endoscopy;  Laterality: N/A;  priority 4  ? HERNIA REPAIR Bilateral   ? inguinal  ? REPAIR EXTENSOR TENDON Right 04/28/2020  ? Procedure: EXPLORATION OF RIGHT DORSAL HAND LACERATION WITH REPAIR OF INDEX EXTENSOR TENDON LACERATION;  Surgeon: Corky Mull, MD;  Location: [facility];  Service: Orthopedics;  Laterality: Right;  ? 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  ? 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 RESECTION OF HAND NEUROMA Right 03/23/2021  ? 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 UMBILICAL HERNIA REPAIR N/A 08/11/2019  ? Procedure: HERNIA REPAIR UMBILICAL ADULT;  Surgeon: Fredirick Maudlin, MD;  Location: [facility];  Service: General;  Laterality: N/A;  RNFA  ? ? ?There were no vitals filed for this visit. ? ? Subjective Assessment - 07/19/21 0856   ? ? Subjective  Able to use it more but still trouble with starting lawn mower , weed eater - and pitch fork or pounding/vibration - but lighter stuff I can do in the barn and yard   ? Pertinent History Pt had saw accident on 04/26/20 at [facility] and then Surgery by Dr Roland Rack on 04/28/20 - Primary repair of right EIP and index EDC tendons.2. Irrigation and debridement with primary repair of right dorsal hand laceration (approximately 10 cm in length). Pt was seen for OT and cont to had nerve pain -11/10/20 had traumatic Neuroma excision -NOW repeat evaluation status post a excision/reimplantation of traumatic neuroma involving the dorsal aspect of the right hand. Surgery was performed by Dr. Roland Rack on 03/23/2021. 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 The patient was doing well until he was hammering in some fence post and suffered a recurrence of his painful neuroma. The patient presents last week to surgeon off-  reporting no injury or trauma affecting the right hand since his last surgery. He  does report numbness and tingling especially in the second and third digits of the right hand. The patient take ibuprofen addition to occasional hydrocodone as needed for discomfort. Pain score today is a 5 out of 10- Refer to OT - pt to keep splint on until appt with OT this date - and to take off only for HEP and keep on untill follow up appt with surgeon 04/18/21   ? Patient Stated Goals Want to get the use of my R hand back to be able to work and take care of the animals and yard /garden at home   ? Currently in Pain? No/denies   ? ?  ?  ? ?  ? ? ? ? ? [facility] OT Assessment  - 07/19/21 0001   ? ?  ? AROM  ? Right Wrist Extension 70 Degrees   ? Right Wrist Flexion 90 Degrees   ?  ? Right Hand AROM  ? R Index  MCP 0-90 80 Degrees   ? R Index PIP 0-100 100 Degrees   ? R Long  MCP 0-90 80 Degrees   ? R Long PIP 0-100 100 Degrees   ? R Ring  MCP 0-90 90 Degrees   ? R Ring PIP 0-100 100 Degrees   ? R Little  MCP 0-90 90 Degrees   ? R Little PIP 0-100 90 Degrees   ? ?  ?  ? ?  ? ? ? ? ?  Patient coming in today with increased composite digit flexion with wrist flexion at 90 degrees for wrist flexion.  And wrist extension is 70 degrees.  Pull during composite fist and wrist flexion pain-free this date.  ?   ?   ?  ?   ?This date done with patient on BTE strengthening gripping large screwdriver to 130 combining with repetitive wrist flexion and wrist extension doing each 120 seconds at 3 pounds.   ?Done this date on BTE three-point pinch at 10 pounds with increased irritation at nerve after 120 seconds 2/10 pain.  But subsided followed by lateral pinch no issues 10 pounds 120 seconds. ?Continue at home  with green and teal mix putty patient to do with a tight composite flexion ulnar radial deviation 2 sets of 12, wrist flexion extension with composite tight fist 2 sets of 12.   ?Add to home exercises also can do supination with composite fist 12 reps.   ?  ?Simulated pulling  to start weed eater -  silver  ?Thera-Band inbetween 3rd  and 4th - could do  ?Also did Silver band simulating pulling on leash for goat hand overhand as well as right hand by itself could do 3 x 1 minute ?  ?Increase patient today on Biodex to tricep 20 pounds 2 sets of 15.  Bilateral chest press and pullbacks 25 pounds 2 sets of 15 ?Bilaterally pulling up from ground simulating lifting and carrying wheelbarrow patient 20 pounds 3 sets of 12 with some discomfort at 2/10 ?Trunk rotation with chest press this was able to upgrade to 10 pounds 2 sets of 10 each side ? ?Patient do report he is looking into electric  battery-operated weedeater lawnmower for him not able to tolerate starting.  ?  ?  ?  ?  ? ? ? ? ? ? ? ? ? ? ? ? ? ? OT Education - 07/19/21 0919   ? ? Education Details Changes to home program and education of modification activities at home   ?  Person(s) Educated Patient   ? Methods Explanation;Demonstration;Tactile cues;Verbal cues;Handout   ? Comprehension Verbal cues required;Returned demonstration;Verbalized understanding   ? ?  ?  ? ?  ? ? ? OT Short Term Goals - 04/11/21 1605   ? ?  ? OT SHORT TERM GOAL #1  ? Title Pt to be independent in HEP to tolerate desentitization of different textures/gentle scar massage and full extention of digits to donn gloves  with discomfort less than 2/10   ? Baseline at rest pain - burning 1-5/10 - numbness over dorsal 2nd and 3rd digits and in webspace- cannot toleate touching of dorsal hand over 2nd and 3rd MC's and incision   ? Time 4   ? Period Weeks   ? Status New   ? Target Date 05/09/21   ?  ? OT SHORT TERM GOAL #2  ? Title R wrist AROM increase to WNL without increase symptoms   ? Baseline pain over dorsal hand -increase pull - increase with wrist flexion with gravity   ? Time 3   ? Period Weeks   ? Status New   ? Target Date 05/02/21   ? ?  ?  ? ?  ? ? ? ? OT Long Term Goals - 04/11/21 1604   ? ?  ? OT LONG TERM GOAL #1  ? Title Pt R hand digits flexion increase for pt to touch palm to grip and release IADLs objects with pain less than 2-3/10   ? Baseline in soft volar splint /cast - except with HEP, pain at rest 1-5/10 pain over dorsal hand   ? Time 5   ? Period Weeks   ? Status New   ? Target Date 05/16/21   ?  ? OT LONG TERM GOAL #2  ? Title Grip strength increase to more than 50% compare to L to grip and pick up objects more than 5 lbs without symptoms   ? Baseline NT - 2 1/2 wkss/p - volar splint   ? Time 10   ? Period Weeks   ? Status New   ? Target Date 06/20/21   ?  ? OT LONG TERM GOAL #3  ? Title Prehension strength increase to more than 50% compare to L to  cut food, open packages, use in farm with animals   ? Baseline 2 1/2 wks s/p - volar splint   ? Time 8   ? Period Weeks   ? Status New   ? Target Date 06/06/21   ?  ? OT LONG TERM GOAL #4  ? Title Function increas f

## 2021-07-22 ENCOUNTER — Telehealth: Payer: Self-pay | Admitting: Pharmacy Technician

## 2021-07-22 ENCOUNTER — Other Ambulatory Visit: Payer: Self-pay

## 2021-07-22 NOTE — Telephone Encounter (Signed)
Received updated proof of income.  Patient eligible to receive medication assistance at [facility] until time for re-certification in 2024, and as long as eligibility requirements continue to be met. ? ?Douglas Bird J. Douglas Bird ?Care Manager ?[facility]  ?

## 2021-07-26 ENCOUNTER — Ambulatory Visit: Payer: PRIVATE HEALTH INSURANCE | Admitting: Occupational Therapy

## 2021-07-26 DIAGNOSIS — L905 Scar conditions and fibrosis of skin: Secondary | ICD-10-CM | POA: Diagnosis not present

## 2021-07-26 DIAGNOSIS — M25631 Stiffness of right wrist, not elsewhere classified: Secondary | ICD-10-CM

## 2021-07-26 DIAGNOSIS — M79641 Pain in right hand: Secondary | ICD-10-CM

## 2021-07-26 DIAGNOSIS — M25641 Stiffness of right hand, not elsewhere classified: Secondary | ICD-10-CM

## 2021-07-26 NOTE — Therapy (Signed)
[facility] ?[address] ?[place], [place], [address] ?Phone: [phone]   Fax:  [phone] ? ?Occupational Therapy Treatment ? ?Patient Details  ?Name: Douglas Bird ?MRN: [id number] ?Date of Birth: [birth year] ?Referring Provider (OT): Dr Roland Rack ? ? ?Encounter Date: 07/26/2021 ? ? OT End of Session - 07/26/21 2952   ? ? Visit Number 25   ? Number of Visits 26   ? Date for OT Re-Evaluation 08/05/21   ? OT Start Time [phone]   ? OT Stop Time 0915   ? OT Time Calculation (min) 54 min   ? Activity Tolerance Patient tolerated treatment well   ? Behavior During Therapy [facility] for tasks assessed/performed   ? ?  ?  ? ?  ? ? ?Past Medical History:  ?Diagnosis Date  ? Diverticulitis 2011  ? GERD (gastroesophageal reflux disease)   ? Hyperlipidemia   ? Hypertension   ? controlled on meds  ? Hypothyroidism   ? Thyroid disease   ? ? ?Past Surgical History:  ?Procedure Laterality Date  ? CHOLECYSTECTOMY  June 2013  ? COLONOSCOPY WITH PROPOFOL N/A 04/28/2019  ? Procedure: COLONOSCOPY WITH PROPOFOL;  Surgeon: Lin Landsman, MD;  Location: [facility];  Service: Endoscopy;  Laterality: N/A;  priority 4  ? HERNIA REPAIR Bilateral   ? inguinal  ? REPAIR EXTENSOR TENDON Right 04/28/2020  ? Procedure: EXPLORATION OF RIGHT DORSAL HAND LACERATION WITH REPAIR OF INDEX EXTENSOR TENDON LACERATION;  Surgeon: Corky Mull, MD;  Location: [facility];  Service: Orthopedics;  Laterality: Right;  ? 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  ? 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 RESECTION OF HAND NEUROMA Right 03/23/2021  ? 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 UMBILICAL HERNIA REPAIR N/A 08/11/2019  ? Procedure: HERNIA REPAIR UMBILICAL ADULT;  Surgeon: Fredirick Maudlin, MD;  Location: [facility];  Service: General;  Laterality: N/A;  RNFA  ? ? ?There were no vitals filed for this visit. ? ? Subjective Assessment - 07/26/21 0830   ? ? Subjective  I drove down to  [facility] - my hand sore but not pain - tight making fist - did spray weedeater did use my R hand about 50%   ? Pertinent History Pt had saw accident on 04/26/20 at [facility] and then Surgery by Dr Roland Rack on 04/28/20 - Primary repair of right EIP and index EDC tendons.2. Irrigation and debridement with primary repair of right dorsal hand laceration (approximately 10 cm in length). Pt was seen for OT and cont to had nerve pain -11/10/20 had traumatic Neuroma excision -NOW repeat evaluation status post a excision/reimplantation of traumatic neuroma involving the dorsal aspect of the right hand. Surgery was performed by Dr. Roland Rack on 03/23/2021. 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 The patient was doing well until he was hammering in some fence post and suffered a recurrence of his painful neuroma. The patient presents last week to surgeon off-  reporting no injury or trauma affecting the right hand since his last surgery. He does report numbness and tingling  especially in the second and third digits of the right hand. The patient take ibuprofen addition to occasional hydrocodone as needed for discomfort. Pain score today is a 5 out of 10- Refer to OT - pt to keep splint on until appt with OT this date - and to take off only for HEP and keep on untill follow up appt with surgeon 04/18/21   ? Patient Stated Goals Want to get the use of my R hand back to be able to work and take care of the animals and yard /garden at home   ? Currently in Pain? No/denies   ? ?  ?  ? ?  ? ? ? ? ? ? ? ? ? ? ? ? ? ? ? OT Treatments/Exercises (OP) - 07/26/21  0001   ? ?  ? Moist Heat Therapy  ? Number Minutes Moist Heat 5 Minutes   ? Moist Heat Location Hand   ? ?  ?  ? ?  ? ? ? Patient coming in today with increased composite digit flexion with wrist flexion at 90 degrees for wrist flexion.  And wrist extension is 70 degrees.  Pull during composite fist and wrist flexion  - tight feeling - but pain-free this date.  ?   ?   ?  ?   ? BTE strengthening gripping small screwdriver with  grip repetitive wrist flexion and wrist extension doing each 120 seconds at 4 pounds.   ?Done this date on BTE three-point pinch at 10 pounds with increased irritation at nerve after 120 seconds 2/10 pain.  But subsided followed by lateral pinch no issues 10 pounds 120 seconds. ?Done green and teal mix putty patient to do with a tight composite flexion ulnar ,radial deviation 2 sets of 12, wrist flexion extension with composite tight fist 2 sets of 12.  Needed min A for technique  ?Add to home exercises also can do supination with composite fist 12 reps.   ?On BTE done this date D ring 2 lbs - focus during pronation to maintain wrist neutral -still have issue maintaining wrist neutral  ?  ?Simulated pulling  to start weed eater -  silver  ?Thera-Band inbetween 3rd  and 4th -  at home ?Also did Silver band simulating pulling on leash for goat hand overhand as well as right hand by itself could do 3 x 1 minute ?  ? Biodex to tricep 20 pounds 2 sets of 15.  Bilateral chest press and pullbacks 25 pounds 2 sets of 15 ?Bilaterally pulling up from ground simulating lifting and carrying wheelbarrow patient 20 pounds 3 sets of 12 with some discomfort at 2/10 ?Trunk rotation with chest press this was able to upgrade to 10 pounds 2 sets of 10 each side ?2 kg ball throw and catch bilateral 2 x  2 min  ?  ?Patient do report he is looking into electric battery-operated weedeater lawnmower for him not able to tolerate starting.  ?  ? Pt to bring his drill in next session  ?  ? ? ? ? ? ? OT Education -  07/26/21 0832   ? ? Education Details Changes to home program and education of modification activities at home   ? Person(s) Educated Patient   ? Methods Explanation;Demonstration;Tactile cues;Verbal cues;Handout   ? Comprehension Verbal cues required;Returned demonstration;Verbalized understanding   ? ?  ?  ? ?  ? ? ? OT Short Term Goals - 04/11/21 1605   ? ?  ?  OT SHORT TERM GOAL #1  ? Title Pt to be independent in HEP to tolerate desentitization of different textures/gentle scar massage and full extention of digits to donn gloves  with discomfort less than 2/10   ? Baseline at rest pain - burning 1-5/10 - numbness over dorsal 2nd and 3rd digits and in webspace- cannot toleate touching of dorsal hand over 2nd and 3rd MC's and incision   ? Time 4   ? Period Weeks   ? Status New   ? Target Date 05/09/21   ?  ? OT SHORT TERM GOAL #2  ? Title R wrist AROM increase to WNL without increase symptoms   ? Baseline pain over dorsal hand -increase pull - increase with wrist flexion with gravity   ? Time 3   ? Period Weeks   ? Status New   ? Target Date 05/02/21   ? ?  ?  ? ?  ? ? ? ? OT Long Term Goals - 04/11/21 1604   ? ?  ? OT LONG TERM GOAL #1  ? Title Pt R hand digits flexion increase for pt to touch palm to grip and release IADLs objects with pain less than 2-3/10   ? Baseline in soft volar splint /cast - except with HEP, pain at rest 1-5/10 pain over dorsal hand   ? Time 5   ? Period Weeks   ? Status New   ? Target Date 05/16/21   ?  ? OT LONG TERM GOAL #2  ? Title Grip strength increase to more than 50% compare to L to grip and pick up objects more than 5 lbs without symptoms   ? Baseline NT - 2 1/2 wkss/p - volar splint   ? Time 10   ? Period Weeks   ? Status New   ? Target Date 06/20/21   ?  ? OT LONG TERM GOAL #3  ? Title Prehension strength increase to more than 50% compare to L to cut food, open packages, use in farm with animals   ? Baseline 2 1/2 wks s/p - volar splint   ? Time 8   ? Period Weeks   ? Status  New   ? Target Date 06/06/21   ?  ? OT LONG TERM GOAL #4  ? Title Function increas for pt to push ,pull door open, push up from chair, hammer nail , shake bags for feeding animals   ? Baseline 2 1/2 wks s/p   ?

## 2021-07-27 ENCOUNTER — Other Ambulatory Visit: Payer: Self-pay

## 2021-07-28 ENCOUNTER — Other Ambulatory Visit: Payer: Self-pay

## 2021-07-29 ENCOUNTER — Other Ambulatory Visit: Payer: Self-pay

## 2021-08-01 ENCOUNTER — Other Ambulatory Visit: Payer: Self-pay

## 2021-08-02 ENCOUNTER — Ambulatory Visit: Payer: PRIVATE HEALTH INSURANCE | Attending: Student | Admitting: Occupational Therapy

## 2021-08-02 DIAGNOSIS — L905 Scar conditions and fibrosis of skin: Secondary | ICD-10-CM | POA: Diagnosis present

## 2021-08-02 DIAGNOSIS — M25631 Stiffness of right wrist, not elsewhere classified: Secondary | ICD-10-CM | POA: Insufficient documentation

## 2021-08-02 DIAGNOSIS — M25641 Stiffness of right hand, not elsewhere classified: Secondary | ICD-10-CM | POA: Insufficient documentation

## 2021-08-02 DIAGNOSIS — M6281 Muscle weakness (generalized): Secondary | ICD-10-CM | POA: Insufficient documentation

## 2021-08-02 DIAGNOSIS — M79641 Pain in right hand: Secondary | ICD-10-CM | POA: Diagnosis present

## 2021-08-02 NOTE — Therapy (Signed)
[facility] ?[address] ?[place], [place], [address] ?Phone: [phone]   Fax:  [phone] ? ?Occupational Therapy Treatment ? ?Patient Details  ?Name: Douglas Bird ?MRN: [id number] ?Date of Birth: August 23, 1965 ?Referring Provider (OT): Dr Roland Rack ? ? ?Encounter Date: 08/02/2021 ? ? OT End of Session - 08/02/21 1010   ? ? Visit Number 26   ? Number of Visits 32   ? Date for OT Re-Evaluation 10/31/21   ? OT Start Time 0900   ? OT Stop Time 0950   ? OT Time Calculation (min) 50 min   ? Activity Tolerance Patient tolerated treatment well   ? Behavior During Therapy [facility] for tasks assessed/performed   ? ?  ?  ? ?  ? ? ?Past Medical History:  ?Diagnosis Date  ? Diverticulitis 2011  ? GERD (gastroesophageal reflux disease)   ? Hyperlipidemia   ? Hypertension   ? controlled on meds  ? Hypothyroidism   ? Thyroid disease   ? ? ?Past Surgical History:  ?Procedure Laterality Date  ? CHOLECYSTECTOMY  June 2013  ? COLONOSCOPY WITH PROPOFOL N/A 04/28/2019  ? Procedure: COLONOSCOPY WITH PROPOFOL;  Surgeon: Lin Landsman, MD;  Location: [facility];  Service: Endoscopy;  Laterality: N/A;  priority 4  ? HERNIA REPAIR Bilateral   ? inguinal  ? REPAIR EXTENSOR TENDON Right 04/28/2020  ? Procedure: EXPLORATION OF RIGHT DORSAL HAND LACERATION WITH REPAIR OF INDEX EXTENSOR TENDON LACERATION;  Surgeon: Corky Mull, MD;  Location: [facility];  Service: Orthopedics;  Laterality: Right;  ? 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  ? 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 RESECTION OF HAND NEUROMA Right 03/23/2021  ? 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 UMBILICAL HERNIA REPAIR N/A 08/11/2019  ? Procedure: HERNIA REPAIR UMBILICAL ADULT;  Surgeon: Fredirick Maudlin, MD;  Location: [facility];  Service: General;  Laterality: N/A;  RNFA  ? ? ?There were no vitals filed for this visit. ? ? Subjective Assessment - 08/02/21 1007   ? ? Subjective  It is hay season so my sons had to came and help- I could do about 5 bails but the higher it went the more my wrist bend back and pain. Impact, vibration, digging , pounding still bothers me - cannot use my tools   ? Pertinent History Pt had saw accident on 04/26/20 at [facility] and then Surgery by Dr Roland Rack on 04/28/20 - Primary repair of right EIP and index EDC tendons.2. Irrigation and debridement with primary repair of right dorsal hand laceration (approximately 10 cm in length). Pt was seen for OT and cont to had nerve pain -11/10/20 had traumatic Neuroma excision -NOW repeat evaluation status post a excision/reimplantation of traumatic neuroma involving the dorsal aspect of the right hand. Surgery was performed by Dr. Roland Rack on 03/23/2021. 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 The patient was doing well until he was hammering in some fence post and suffered a recurrence of his painful neuroma. The patient presents last week to surgeon off-  reporting no injury  or trauma affecting the right hand since his last surgery. He does report numbness and tingling especially in the second and third digits of the right hand. The patient take ibuprofen addition to occasional hydrocodone as needed for discomfort. Pain score today is a 5 out of 10- Refer to OT - pt to keep splint on until appt with OT this date - and to take off only for HEP and keep on untill follow up appt with surgeon 04/18/21   ? Patient Stated Goals Want to get the use of my R hand back to be able to work and take care of the animals and yard /garden at home   ? Currently in Pain? Yes    ? Pain Score 2    ? Pain Location Hand   ? Pain Orientation Right   ? Pain Descriptors / Indicators Aching   ? Pain Type Surgical pain;Neuropathic pain   ? Pain Onset More than a month ago   ? Pain Frequency Intermittent   ? ?  ?  ? ?  ? ? ? ? ? ?  I have been addressing this past few weeks increase strengthening with increased sets and repetitions for bilateral upper extremities on Biodex simulating activities in his yard and farm with animals.  Patient able to tolerate bilateral upper extremities 20-25 pounds.  Pulling from floor bilaterally 20 pounds simulating wheelbarrow. ?Trunk rotation of the chest press patient able to do 10 pounds. ?Patient able to do do on BTE wrist extension and flexion with a grip repetitively as well as grip strengthening and lateral pinch without irritation. ?But do show increased nerve irritation when attempting to do three-point pinch. ?Patient is nerve pain increase if patient to repetitively MC flexion of the second and third digits. ?As well as when lifting heavy object where he has to do composite digit flexion flexion with a wrist flexion . ? ?Patient continues to be limited by vibration, impact, pounding, digging, shaking, or quick snapping motion to throw ball. ?This is date addressed with patient modifications as well as assessing a possible work soft neoprene splint or glove to help with and vibration absorption. ?Look into a hard splint design, tried 3 prefab neoprene wraps. ?Attempted different materials of  wrap around his drill or nail gun using close cell  foam., neoprene material, comprex foam- did help with absorption, but made it too thick or bulky.  Assess patient with a couple of prefab neoprene wraps or splints.  Designed and fabricated attachment to [facility] neoprene wrist and thumb wrap-attachment of neoprene with the wraparound proximal phalanges of second digits fastening on dorsal second MC.  Patient report more ease less discomfort and pain with using that with  his drill. ?Patient to use this custom neoprene splint or wrap with his nail gun, staple gun, lawnmowers, drill and lawnmowers. ?Also patient to look into different materials of rubber to wrap vibration objects to absorb vibration. ?Also recommend him to look into Vibrastop gloves to use -patient report he does have some padded gloves that he will look into and trying use this week. ?Patient to see surgeon Monday. ? ?Patient do report he is looking into electric battery-operated weedeater lawnmower and other tools that slow lighter and not as much vibration at home in the yard and farm.  ?  Await new orders from surgeon after Monday's appointment-could recommend following patient every other week for 3 months. ?  ?  ? ? ? ? ? ? ? ? ? ? ? ? ? ? ? ? ?  OT Education - 08/02/21 1010   ? ? Education Details Changes to home program and education of modification activities at home   ? Person(s) Educated Patient   ? Methods Explanation;Demonstration;Tactile cues;Verbal cues;Handout   ? Comprehension Verbal cues required;Returned demonstration;Verbalized understanding   ? ?  ?  ? ?  ? ? ? ? ?   ? OT SHORT TERM GOAL #1  ? Title Pt to be independent in HEP to tolerate desentitization of  ?different textures/gentle scar massage and full extention of digits to  ?donn gloves  with discomfort less than 2/10    ? MET Goal  ?   ? OT SHORT TERM GOAL #2  ? Title R wrist AROM increase to WNL without increase symptoms    ?   ? MET Goal  ?   ?   ?   ? ?  ?   ? OT LONG TERM GOAL #1  ? Title Pt R hand digits flexion increase for pt to touch palm to grip and  ?release IADLs objects with pain less than 2-3/10    ? MET  ?   ? OT LONG TERM GOAL #2  ? Title Grip strength increase to more than 50% compare to L to grip and  ?pick up objects more than 5 lbs without symptoms    ? MET  ? ? OT LONG TERM GOAL #3  ? Title Prehension strength increase to more than 50% compare to L to cut  ?food, open packages, use in farm with animals    ? Baseline Pinch  increase -but pain increase pain or discomfort with repetitive 3 point pinch  ? Time 12    ? Period Weeks    ? Status progressing    ? Target Date 10/31/21  ?   ? OT LONG TERM GOAL #4  ? Title Function

## 2021-08-07 DIAGNOSIS — M25641 Stiffness of right hand, not elsewhere classified: Secondary | ICD-10-CM | POA: Diagnosis present

## 2021-08-07 DIAGNOSIS — L905 Scar conditions and fibrosis of skin: Secondary | ICD-10-CM | POA: Diagnosis present

## 2021-08-07 DIAGNOSIS — M6281 Muscle weakness (generalized): Secondary | ICD-10-CM | POA: Diagnosis present

## 2021-08-07 DIAGNOSIS — M25631 Stiffness of right wrist, not elsewhere classified: Secondary | ICD-10-CM | POA: Diagnosis present

## 2021-08-07 DIAGNOSIS — M79641 Pain in right hand: Secondary | ICD-10-CM | POA: Diagnosis present

## 2021-08-10 ENCOUNTER — Other Ambulatory Visit: Payer: Self-pay

## 2021-08-10 MED FILL — Omeprazole Cap Delayed Release 20 MG: ORAL | 90 days supply | Qty: 90 | Fill #0 | Status: AC

## 2021-08-11 ENCOUNTER — Ambulatory Visit: Payer: PRIVATE HEALTH INSURANCE | Admitting: Occupational Therapy

## 2021-08-11 DIAGNOSIS — M6281 Muscle weakness (generalized): Secondary | ICD-10-CM

## 2021-08-11 DIAGNOSIS — L905 Scar conditions and fibrosis of skin: Secondary | ICD-10-CM | POA: Diagnosis not present

## 2021-08-11 DIAGNOSIS — M25631 Stiffness of right wrist, not elsewhere classified: Secondary | ICD-10-CM

## 2021-08-11 DIAGNOSIS — M79641 Pain in right hand: Secondary | ICD-10-CM

## 2021-08-11 DIAGNOSIS — M25641 Stiffness of right hand, not elsewhere classified: Secondary | ICD-10-CM

## 2021-08-11 NOTE — Therapy (Signed)
[facility] [address] [place], [address] Phone: [phone]   Fax:  [phone]  Occupational Therapy Treatment  Patient Details  Name: Douglas Bird MRN: [id number] Date of Birth: 1966/02/08 Referring Provider (OT): Dr Roland Rack   Encounter Date: 08/11/2021   OT End of Session - 08/11/21 0817     Visit Number 27    Number of Visits 32    Date for OT Re-Evaluation 10/31/21    OT Start Time 0817    OT Stop Time 0900    OT Time Calculation (min) 43 min    Activity Tolerance Patient tolerated treatment well    Behavior During Therapy [facility] for tasks assessed/performed             Past Medical History:  Diagnosis Date   Diverticulitis 2011   GERD (gastroesophageal reflux disease)    Hyperlipidemia    Hypertension    controlled on meds   Hypothyroidism    Thyroid disease     Past Surgical History:  Procedure Laterality Date   CHOLECYSTECTOMY  June 2013   COLONOSCOPY WITH PROPOFOL N/A 04/28/2019   Procedure: COLONOSCOPY WITH PROPOFOL;  Surgeon: Lin Landsman, MD;  Location: [facility];  Service: Endoscopy;  Laterality: N/A;  priority 4   HERNIA REPAIR Bilateral    inguinal   REPAIR EXTENSOR TENDON Right 04/28/2020   Procedure: EXPLORATION OF RIGHT DORSAL HAND LACERATION WITH REPAIR OF INDEX EXTENSOR TENDON LACERATION;  Surgeon: Corky Mull, MD;  Location: [facility];  Service: Orthopedics;  Laterality: Right;   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   Procedure: 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.;  Surgeon: Corky Mull, MD;  Location: [facility];  Service: Orthopedics;  Laterality: Right;   RESECTION OF HAND NEUROMA Right 03/23/2021   Procedure: 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   Subjective Assessment - 08/11/21 0817     Subjective  I seen Dr. Roland Rack this past Monday and was very happy with the progress but the nerve is still bothering me with any impact heavy vibration for full activities.  Please refer injury to the doctor at [facility] to do something with ice and my nerve.  Somebody from them called me yesterday and they will call back with the date for which to go back.  He told me just to continue the same things and not irritate the nerve from doing anything until I see the doctor at [facility]    Pertinent History Pt had saw accident on 04/26/20 at [facility] and then Surgery by Dr Roland Rack on 04/28/20 - Primary repair of right EIP and index EDC tendons.2. Irrigation and debridement with primary repair of right dorsal hand laceration (approximately 10 cm in length). Pt was seen for OT and cont to had nerve pain -11/10/20 had traumatic Neuroma excision -NOW repeat evaluation status post a excision/reimplantation of traumatic neuroma involving the dorsal aspect of the right hand. Surgery was performed by Dr. Roland Rack on 03/23/2021. 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 The patient was doing well until he was hammering in some fence post and suffered a recurrence of his painful neuroma. The patient presents last week to surgeon off-  reporting no injury or trauma affecting the right hand since his last surgery. He does report numbness and tingling especially in the second and third digits of the right hand. The patient take ibuprofen addition to occasional hydrocodone as needed for discomfort. Pain score today is a 5 out of 10- Refer to OT - pt to keep splint on until appt with OT this date  - and to take off only for HEP and keep on untill follow up appt with surgeon 04/18/21    Patient Stated Goals Want to get the use of my R hand back to be able to work and take care of the animals and yard /garden at home    Currently in Pain? 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  Patient able to tolerate bilateral upper extremities 20-25 pounds.  Pulling from floor bilaterally 20 pounds simulating wheelbarrow. Trunk rotation of the chest press patient able to do 10 pounds. Patient able to do do on BTE wrist extension and flexion with a grip repetitively as well as grip strengthening and lateral pinch without irritation. But do show increased nerve irritation when attempting to do three-point pinch. Patient is nerve pain increase if patient to repetitively MC flexion of the second and third digits. As well as when lifting heavy object where he has to do composite digit flexion flexion with a wrist flexion .   Patient continues to be limited by vibration but can do sometimes with padding and glove 15 min now  Cannot tolerate  impact, pounding, digging, shaking, or quick snapping motion to throw ball.  Last week addressed with patient modifications as well as assessing a possible work soft neoprene splint or glove to help with and vibration absorption. Look into a hard splint design, tried 3 prefab neoprene wraps. Attempted different materials of  wrap around his drill or nail gun using close cell  foam., neoprene material, comprex  foam- did help with absorption, but made it too thick or bulky.  Assess patient with a couple of prefab neoprene wraps or splints.  Designed and fabricated attachment to [facility] neoprene wrist and thumb wrap-attachment of neoprene with the wraparound proximal phalanges of second digits fastening on dorsal second MC.  Patient report more ease less discomfort and pain with using that with his drill. Patient to use this custom neoprene splint or wrap with his snap staple gun-could do about 5 but then pain increased to 3/10  Patient to continue to use custom neoprene splint with wrap around proximal phalanges of the second as well as wrapping padding around to use and using padded  gloves may be with piece of nail done, staple gun, lawnmowers, drill and lawnmowers. Also patient to look into different materials of rubber to wrap vibration objects to absorb vibration. Also recommend him to look into Vibrastop gloves to use -patient report he does have some padded gloves that he will look into and trying use this week. Patient to see surgeon Monday.   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    Per surgeon patient just continue OT every other week for 4 visits  Addressed this date with patient after heat passive composite flexion of second third digits after which patient maintaining flexion adding wrist flexion.  Pain does increase or pull to 2/10 but less after the heat.  Followed by radial nerve glides 5 reps patient to do this in the morning and later in the evening prior to using his hand.  Patient to follow surgeon's orders not to aggravate the nerve until his appointment with colleague at [facility] for a nerve ablation appointment.  Patient to contact me when he has that appointment and patient to follow directions from that surgeon and or physician.                 OT  Education - 08/11/21 0817     Education Details Changes to home program and education of modification activities at home    Person(s) Educated Patient    Methods Explanation;Demonstration;Tactile cues;Verbal cues;Handout    Comprehension Verbal cues required;Returned demonstration;Verbalized understanding              OT Short Term Goals - 04/11/21 1605       OT SHORT TERM GOAL #1   Title Pt to be independent in HEP to tolerate desentitization of different textures/gentle scar massage and full extention of digits to donn gloves  with discomfort less than 2/10    Baseline at rest pain - burning 1-5/10 - numbness over dorsal 2nd and 3rd digits and in webspace- cannot toleate touching of dorsal hand over 2nd and 3rd MC's and incision    Time 4    Period Weeks    Status New    Target Date 05/09/21      OT SHORT TERM GOAL #2   Title R wrist AROM increase to WNL without increase symptoms    Baseline pain over dorsal hand -increase pull - increase with wrist flexion with gravity    Time 3    Period Weeks    Status New    Target Date 05/02/21               OT Long Term Goals - 04/11/21 1604       OT LONG TERM GOAL #1   Title Pt R hand digits flexion increase for pt to touch palm to grip and release IADLs objects with pain less than 2-3/10    Baseline in soft volar splint /cast - except with HEP, pain at rest 1-5/10 pain over dorsal hand    Time 5    Period Weeks    Status New    Target Date 05/16/21      OT LONG TERM GOAL #2   Title Grip strength increase to more than 50% compare to L to grip and pick up objects more than 5 lbs without symptoms    Baseline NT - 2 1/2 wkss/p - volar splint  Time 10    Period Weeks    Status New    Target Date 06/20/21      OT LONG TERM GOAL #3   Title Prehension strength increase to more than 50% compare to L to cut food, open packages, use in farm with animals    Baseline 2 1/2 wks s/p - volar splint    Time 8    Period Weeks     Status New    Target Date 06/06/21      OT LONG TERM GOAL #4   Title Function increas for pt to push ,pull door open, push up from chair, hammer nail , shake bags for feeding animals    Baseline 2 1/2 wks s/p    Time 10    Period Weeks    Status New    Target Date 06/20/21                   Plan - 08/11/21 0817     Clinical Impression Statement Patient is about 15 months post right repair of second digit EIP and EDC with irrigation debridement.  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  Patient had injury at hand and needed a second procedure of excision of painful neuroma of the dorsal hand on 03/23/2021.  Patient is  NOW about 4 1/2  months postop last procedure.  Patient  seen  1x wk the last few wks and coming most of the time now in pain-free.  Patient seen Dr. Roland Rack this past Monday and recommend for patient to continue therapy every other week for 4 visits.  In the meantime surgeon did refer patient to a colleague at [facility] for consultation for nerve ablation.  Patient awaiting call from [facility] for appointment.  Pt cont to be limited in vibration, impact, pounding, repetitive 3 point pinch with resistance, digging, lifting and pushing putting force on dorsal 2nd and 3rd MC's.  Addressing composite  AROM and passive range of motion of R digits with wrist flexion to WNL.  At home exercises patient to keep pain under 2/10. Bilaterally UE resistance  tolerating 20-25 lbs in weight on Biodex with pulling from ground at 20lbs simulating wheelbarrel .  Patient can now do 4- 5 pounds for wrist and elbow. Trunk rotation with chest press now 10lbs. Attempted/simulated many different activities with repetition, gripping , pinching, pulling and pushing increasing weight , reps and sets gradually.  Last week  fabricated after assessing several modifications and designs custom hand /wrist wrap/  neoprene splint to use with his impact drill, staple and nail gun and vibration tools. Padding was making tools to large but with 2nd digit proximal phalanges neoprene wrap around and anchor dorsally on hand attachment to [facility] neoprene splint pt was able to tolerate impact at end of using drill better but not pain-free after for 5 repetitions.  He can tolerate some three-point triggering use or vibration for 15 minutes.. Pt to try at home with his vibration tools. Also discuss with pt modifications getting leather/ rubber wraps and padding to wrap around objects to absorp vibration as well as looking into Vibrastop gloves Pt report he has some padded gloves that he is trying. 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  Patient  to call me when he has appointment for the nerve ablation.    OT Occupational Profile and History Problem Focused Assessment - Including review of records relating to presenting problem    Occupational performance deficits (Please refer to evaluation for details): ADL's;IADL's;Rest and Sleep;Work;Play;Leisure;Social Participation    Body Structure / Function / Physical Skills ADL;Edema;Dexterity;Decreased knowledge of use of DME;Decreased knowledge of precautions;Flexibility;ROM;UE functional use;Scar mobility;Pain;Strength;IADL;Coordination    Rehab Potential Fair    Clinical Decision Making Limited treatment options, no task modification necessary    Comorbidities Affecting Occupational Performance: None    Modification or Assistance to Complete Evaluation  No modification of tasks or assist necessary to complete eval    OT Frequency Biweekly    OT Duration 12 weeks    OT Treatment/Interventions Self-care/ADL training;Contrast Bath;Fluidtherapy;Paraffin;Therapeutic exercise;Ultrasound;Manual Therapy;Passive range of motion;Scar mobilization;Splinting;Patient/family  education;Therapeutic activities    Consulted and Agree with Plan of Care Patient             Patient will benefit from skilled therapeutic intervention in order to improve the following deficits and impairments:   Body Structure / Function / Physical Skills: ADL, Edema, Dexterity, Decreased knowledge of use of DME, Decreased knowledge of precautions, Flexibility, ROM, UE functional use, Scar mobility, Pain, Strength, IADL, Coordination       Visit Diagnosis: Scar condition and fibrosis of skin  Pain in right hand  Stiffness of right hand, not elsewhere classified  Muscle weakness (generalized)  Stiffness of right wrist, not elsewhere classified    Problem List Patient Active Problem List   Diagnosis Date Noted   Toothache 02/20/2020   Headache 02/20/2020   Smoking 27/78/2423   Umbilical hernia, incarcerated    Back pain 03/27/2019   Benign hypertension 05/02/2018   Obesity (BMI 30.0-34.9) 04/30/2018   Healthcare maintenance 10/19/2016   Flu vaccine need 04/20/2016   GERD (gastroesophageal reflux disease) 04/01/2015   Gout 04/01/2015   Mixed hyperlipidemia 10/01/2014   Hypothyroidism 05/21/2014    Rosalyn Gess, OTR/L,CLT 08/11/2021, 11:11 AM  [facility] [address], [place], [address] Phone: [phone]   Fax:  [phone]  Name: Douglas Bird MRN: [id number] Date of Birth: [birth year]

## 2021-08-23 ENCOUNTER — Ambulatory Visit: Payer: PRIVATE HEALTH INSURANCE | Admitting: Occupational Therapy

## 2021-08-25 ENCOUNTER — Other Ambulatory Visit: Payer: Self-pay | Admitting: Emergency Medicine

## 2021-08-25 ENCOUNTER — Other Ambulatory Visit: Payer: Self-pay

## 2021-08-25 ENCOUNTER — Encounter: Payer: PRIVATE HEALTH INSURANCE | Admitting: Occupational Therapy

## 2021-08-25 DIAGNOSIS — E782 Mixed hyperlipidemia: Secondary | ICD-10-CM

## 2021-08-25 DIAGNOSIS — E039 Hypothyroidism, unspecified: Secondary | ICD-10-CM

## 2021-08-26 LAB — LIPID PANEL
Chol/HDL Ratio: 4.8 ratio (ref 0.0–5.0)
Cholesterol, Total: 179 mg/dL (ref 100–199)
HDL: 37 mg/dL — ABNORMAL LOW (ref >39)
LDL Chol Calc (NIH): 76 mg/dL (ref 0–99)
Triglycerides: 414 mg/dL — ABNORMAL HIGH (ref 0–149)
VLDL Cholesterol Cal: 66 mg/dL — ABNORMAL HIGH (ref 5–40)

## 2021-08-26 LAB — TSH: TSH: 3.28 u[IU]/mL (ref 0.450–4.500)

## 2021-09-01 ENCOUNTER — Ambulatory Visit: Payer: Self-pay | Admitting: Gerontology

## 2021-09-01 VITALS — BP 135/85 | HR 77 | Temp 97.9°F | Ht 68.0 in | Wt 228.2 lb

## 2021-09-01 DIAGNOSIS — E039 Hypothyroidism, unspecified: Secondary | ICD-10-CM

## 2021-09-01 DIAGNOSIS — E782 Mixed hyperlipidemia: Secondary | ICD-10-CM

## 2021-09-01 DIAGNOSIS — I10 Essential (primary) hypertension: Secondary | ICD-10-CM

## 2021-09-01 MED ORDER — LISINOPRIL 20 MG PO TABS
20.0000 mg | ORAL_TABLET | Freq: Every day | ORAL | 1 refills | Status: DC
  Filled 2021-09-01: qty 90, 90d supply, fill #0
  Filled 2021-12-01: qty 90, 90d supply, fill #1

## 2021-09-01 MED ORDER — HYDROCHLOROTHIAZIDE 12.5 MG PO CAPS
ORAL_CAPSULE | ORAL | 1 refills | Status: DC
  Filled 2021-09-01: qty 90, fill #0
  Filled 2021-10-18: qty 90, 90d supply, fill #0
  Filled 2022-01-17: qty 90, 90d supply, fill #1

## 2021-09-01 MED ORDER — LEVOTHYROXINE SODIUM 150 MCG PO TABS
ORAL_TABLET | Freq: Every day | ORAL | 1 refills | Status: DC
  Filled 2021-09-01: qty 90, fill #0
  Filled 2021-10-31: qty 19, 19d supply, fill #0
  Filled 2021-11-21: qty 30, 30d supply, fill #1
  Filled 2021-12-18: qty 30, 30d supply, fill #2
  Filled 2022-01-17: qty 30, 30d supply, fill #3
  Filled 2022-02-16: qty 30, 30d supply, fill #4

## 2021-09-01 MED ORDER — ATORVASTATIN CALCIUM 40 MG PO TABS
ORAL_TABLET | ORAL | 1 refills | Status: DC
  Filled 2021-09-01: qty 90, 90d supply, fill #0
  Filled 2021-12-01: qty 90, 90d supply, fill #1

## 2021-09-01 NOTE — Patient Instructions (Addendum)
Heart-Healthy Eating Plan Many factors influence your heart (coronary) health, including eating and exercise habits. Coronary risk increases with abnormal blood fat (lipid) levels. Heart-healthy meal planning includes limiting unhealthy fats, increasing healthy fats, and making other diet and lifestyle changes. What is my plan? Your health care provider may recommend that you: Limit your fat intake to _________% or less of your total calories each day. Limit your saturated fat intake to _________% or less of your total calories each day. Limit the amount of cholesterol in your diet to less than _________ mg per day. What are tips for following this plan? Cooking Cook foods using methods other than frying. Baking, boiling, grilling, and broiling are all good options. Other ways to reduce fat include: Removing the skin from poultry. Removing all visible fats from meats. Steaming vegetables in water or broth. Meal planning  At meals, imagine dividing your plate into fourths: Fill one-half of your plate with vegetables and green salads. Fill one-fourth of your plate with whole grains. Fill one-fourth of your plate with lean protein foods. Eat 4-5 servings of vegetables per day. One serving equals 1 cup raw or cooked vegetable, or 2 cups raw leafy greens. Eat 4-5 servings of fruit per day. One serving equals 1 medium whole fruit,  cup dried fruit,  cup fresh, frozen, or canned fruit, or  cup 100% fruit juice. Eat more foods that contain soluble fiber. Examples include apples, broccoli, carrots, beans, peas, and barley. Aim to get 25-30 g of fiber per day. Increase your consumption of legumes, nuts, and seeds to 4-5 servings per week. One serving of dried beans or legumes equals  cup cooked, 1 serving of nuts is  cup, and 1 serving of seeds equals 1 tablespoon. Fats Choose healthy fats more often. Choose monounsaturated and polyunsaturated fats, such as olive and canola oils, flaxseeds,  walnuts, almonds, and seeds. Eat more omega-3 fats. Choose salmon, mackerel, sardines, tuna, flaxseed oil, and ground flaxseeds. Aim to eat fish at least 2 times each week. Check food labels carefully to identify foods with trans fats or high amounts of saturated fat. Limit saturated fats. These are found in animal products, such as meats, butter, and cream. Plant sources of saturated fats include palm oil, palm kernel oil, and coconut oil. Avoid foods with partially hydrogenated oils in them. These contain trans fats. Examples are stick margarine, some tub margarines, cookies, crackers, and other baked goods. Avoid fried foods. General information Eat more home-cooked food and less restaurant, buffet, and fast food. Limit or avoid alcohol. Limit foods that are high in starch and sugar. Lose weight if you are overweight. Losing just 5-10% of your body weight can help your overall health and prevent diseases such as diabetes and heart disease. Monitor your salt (sodium) intake, especially if you have high blood pressure. Talk with your health care provider about your sodium intake. Try to incorporate more vegetarian meals weekly. What foods can I eat? Fruits All fresh, canned (in natural juice), or frozen fruits. Vegetables Fresh or frozen vegetables (raw, steamed, roasted, or grilled). Green salads. Grains Most grains. Choose whole wheat and whole grains most of the time. Rice and pasta, including brown rice and pastas made with whole wheat. Meats and other proteins Lean, well-trimmed beef, veal, pork, and lamb. Chicken and turkey without skin. All fish and shellfish. Wild duck, rabbit, pheasant, and venison. Egg whites or low-cholesterol egg substitutes. Dried beans, peas, lentils, and tofu. Seeds and most nuts. Dairy Low-fat or nonfat cheeses,   including ricotta and mozzarella. Skim or 1% milk (liquid, powdered, or evaporated). Buttermilk made with low-fat milk. Nonfat or low-fat  yogurt. Fats and oils Non-hydrogenated (trans-free) margarines. Vegetable oils, including soybean, sesame, sunflower, olive, peanut, safflower, corn, canola, and cottonseed. Salad dressings or mayonnaise made with a vegetable oil. Beverages Water (mineral or sparkling). Coffee and tea. Diet carbonated beverages. Sweets and desserts Sherbet, gelatin, and fruit ice. Small amounts of dark chocolate. Limit all sweets and desserts. Seasonings and condiments All seasonings and condiments. The items listed above may not be a complete list of foods and beverages you can eat. Contact a dietitian for more options. What foods are not recommended? Fruits Canned fruit in heavy syrup. Fruit in cream or butter sauce. Fried fruit. Limit coconut. Vegetables Vegetables cooked in cheese, cream, or butter sauce. Fried vegetables. Grains Breads made with saturated or trans fats, oils, or whole milk. Croissants. Sweet rolls. Donuts. High-fat crackers, such as cheese crackers. Meats and other proteins Fatty meats, such as hot dogs, ribs, sausage, bacon, rib-eye roast or steak. High-fat deli meats, such as salami and bologna. Caviar. Domestic duck and goose. Organ meats, such as liver. Dairy Cream, sour cream, cream cheese, and creamed cottage cheese. Whole-milk cheeses. Whole or 2% milk (liquid, evaporated, or condensed). Whole buttermilk. Cream sauce or high-fat cheese sauce. Whole-milk yogurt. Fats and oils Meat fat, or shortening. Cocoa butter, hydrogenated oils, palm oil, coconut oil, palm kernel oil. Solid fats and shortenings, including bacon fat, salt pork, lard, and butter. Nondairy cream substitutes. Salad dressings with cheese or sour cream. Beverages Regular sodas and any drinks with added sugar. Sweets and desserts Frosting. Pudding. Cookies. Cakes. Pies. Milk chocolate or white chocolate. Buttered syrups. Full-fat ice cream or ice cream drinks. The items listed above may not be a complete list of  foods and beverages to avoid. Contact a dietitian for more information. Summary Heart-healthy meal planning includes limiting unhealthy fats, increasing healthy fats, and making other diet and lifestyle changes. Lose weight if you are overweight. Losing just 5-10% of your body weight can help your overall health and prevent diseases such as diabetes and heart disease. Focus on eating a balance of foods, including fruits and vegetables, low-fat or nonfat dairy, lean protein, nuts and legumes, whole grains, and heart-healthy oils and fats. This information is not intended to replace advice given to you by your health care provider. Make sure you discuss any questions you have with your health care provider. Document Revised: 07/15/2020 Document Reviewed: 07/15/2020 Elsevier Patient Education  2022 [employer] DASH Eating Plan DASH stands for Dietary Approaches to Stop Hypertension. The DASH eating plan is a healthy eating plan that has been shown to: Reduce high blood pressure (hypertension). Reduce your risk for type 2 diabetes, heart disease, and stroke. Help with weight loss. What are tips for following this plan? Reading food labels Check food labels for the amount of salt (sodium) per serving. Choose foods with less than 5 percent of the Daily Value of sodium. Generally, foods with less than 300 milligrams (mg) of sodium per serving fit into this eating plan. To find whole grains, look for the word "whole" as the first word in the ingredient list. Shopping Buy products labeled as "low-sodium" or "no salt added." Buy fresh foods. Avoid canned foods and pre-made or frozen meals. Cooking Avoid adding salt when cooking. Use salt-free seasonings or herbs instead of table salt or sea salt. Check with your health care provider or pharmacist before using salt substitutes.   Do not fry foods. Cook foods using healthy methods such as baking, boiling, grilling, roasting, and broiling instead. Cook  with heart-healthy oils, such as olive, canola, avocado, soybean, or sunflower oil. Meal planning  Eat a balanced diet that includes: 4 or more servings of fruits and 4 or more servings of vegetables each day. Try to fill one-half of your plate with fruits and vegetables. 6-8 servings of whole grains each day. Less than 6 oz (170 g) of lean meat, poultry, or fish each day. A 3-oz (85-g) serving of meat is about the same size as a deck of cards. One egg equals 1 oz (28 g). 2-3 servings of low-fat dairy each day. One serving is 1 cup (237 mL). 1 serving of nuts, seeds, or beans 5 times each week. 2-3 servings of heart-healthy fats. Healthy fats called omega-3 fatty acids are found in foods such as walnuts, flaxseeds, fortified milks, and eggs. These fats are also found in cold-water fish, such as sardines, salmon, and mackerel. Limit how much you eat of: Canned or prepackaged foods. Food that is high in trans fat, such as some fried foods. Food that is high in saturated fat, such as fatty meat. Desserts and other sweets, sugary drinks, and other foods with added sugar. Full-fat dairy products. Do not salt foods before eating. Do not eat more than 4 egg yolks a week. Try to eat at least 2 vegetarian meals a week. Eat more home-cooked food and less restaurant, buffet, and fast food. Lifestyle When eating at a restaurant, ask that your food be prepared with less salt or no salt, if possible. If you drink alcohol: Limit how much you use to: 0-1 drink a day for women who are not pregnant. 0-2 drinks a day for men. Be aware of how much alcohol is in your drink. In the [place] one drink equals one 12 oz bottle of beer (355 mL), one 5 oz glass of wine (148 mL), or one 1 oz glass of hard liquor (44 mL). General information Avoid eating more than 2,300 mg of salt a day. If you have hypertension, you may need to reduce your sodium intake to 1,500 mg a day. Work with your health care provider to  maintain a healthy body weight or to lose weight. Ask what an ideal weight is for you. Get at least 30 minutes of exercise that causes your heart to beat faster (aerobic exercise) most days of the week. Activities may include walking, swimming, or biking. Work with your health care provider or dietitian to adjust your eating plan to your individual calorie needs. What foods should I eat? Fruits All fresh, dried, or frozen fruit. Canned fruit in natural juice (without added sugar). Vegetables Fresh or frozen vegetables (raw, steamed, roasted, or grilled). Low-sodium or reduced-sodium tomato and vegetable juice. Low-sodium or reduced-sodium tomato sauce and tomato paste. Low-sodium or reduced-sodium canned vegetables. Grains Whole-grain or whole-wheat bread. Whole-grain or whole-wheat pasta. Brown rice. Oatmeal. Quinoa. Bulgur. Whole-grain and low-sodium cereals. Pita bread. Low-fat, low-sodium crackers. Whole-wheat flour tortillas. Meats and other proteins Skinless chicken or turkey. Ground chicken or turkey. Pork with fat trimmed off. Fish and seafood. Egg whites. Dried beans, peas, or lentils. Unsalted nuts, nut butters, and seeds. Unsalted canned beans. Lean cuts of beef with fat trimmed off. Low-sodium, lean precooked or cured meat, such as sausages or meat loaves. Dairy Low-fat (1%) or fat-free (skim) milk. Reduced-fat, low-fat, or fat-free cheeses. Nonfat, low-sodium ricotta or cottage cheese. Low-fat or nonfat yogurt.   Low-fat, low-sodium cheese. Fats and oils Soft margarine without trans fats. Vegetable oil. Reduced-fat, low-fat, or light mayonnaise and salad dressings (reduced-sodium). Canola, safflower, olive, avocado, soybean, and sunflower oils. Avocado. Seasonings and condiments Herbs. Spices. Seasoning mixes without salt. Other foods Unsalted popcorn and pretzels. Fat-free sweets. The items listed above may not be a complete list of foods and beverages you can eat. Contact a dietitian  for more information. What foods should I avoid? Fruits Canned fruit in a light or heavy syrup. Fried fruit. Fruit in cream or butter sauce. Vegetables Creamed or fried vegetables. Vegetables in a cheese sauce. Regular canned vegetables (not low-sodium or reduced-sodium). Regular canned tomato sauce and paste (not low-sodium or reduced-sodium). Regular tomato and vegetable juice (not low-sodium or reduced-sodium). Pickles. Olives. Grains Baked goods made with fat, such as croissants, muffins, or some breads. Dry pasta or rice meal packs. Meats and other proteins Fatty cuts of meat. Ribs. Fried meat. Bacon. Bologna, salami, and other precooked or cured meats, such as sausages or meat loaves. Fat from the back of a pig (fatback). Bratwurst. Salted nuts and seeds. Canned beans with added salt. Canned or smoked fish. Whole eggs or egg yolks. Chicken or turkey with skin. Dairy Whole or 2% milk, cream, and half-and-half. Whole or full-fat cream cheese. Whole-fat or sweetened yogurt. Full-fat cheese. Nondairy creamers. Whipped toppings. Processed cheese and cheese spreads. Fats and oils Butter. Stick margarine. Lard. Shortening. Ghee. Bacon fat. Tropical oils, such as coconut, palm kernel, or palm oil. Seasonings and condiments Onion salt, garlic salt, seasoned salt, table salt, and sea salt. Worcestershire sauce. Tartar sauce. Barbecue sauce. Teriyaki sauce. Soy sauce, including reduced-sodium. Steak sauce. Canned and packaged gravies. Fish sauce. Oyster sauce. Cocktail sauce. Store-bought horseradish. Ketchup. Mustard. Meat flavorings and tenderizers. Bouillon cubes. Hot sauces. Pre-made or packaged marinades. Pre-made or packaged taco seasonings. Relishes. Regular salad dressings. Other foods Salted popcorn and pretzels. The items listed above may not be a complete list of foods and beverages you should avoid. Contact a dietitian for more information. Where to find more information National Heart,  Lung, and Blood Institute: www.nhlbi.nih.gov American Heart Association: www.heart.org Academy of Nutrition and Dietetics: www.eatright.org National Kidney Foundation: www.kidney.org Summary The DASH eating plan is a healthy eating plan that has been shown to reduce high blood pressure (hypertension). It may also reduce your risk for type 2 diabetes, heart disease, and stroke. When on the DASH eating plan, aim to eat more fresh fruits and vegetables, whole grains, lean proteins, low-fat dairy, and heart-healthy fats. With the DASH eating plan, you should limit salt (sodium) intake to 2,300 mg a day. If you have hypertension, you may need to reduce your sodium intake to 1,500 mg a day. Work with your health care provider or dietitian to adjust your eating plan to your individual calorie needs. This information is not intended to replace advice given to you by your health care provider. Make sure you discuss any questions you have with your health care provider. Document Revised: 02/07/2019 Document Reviewed: 02/07/2019 Elsevier Patient Education  2023 Elsevier Inc.  

## 2021-09-01 NOTE — Progress Notes (Unsigned)
Established Patient Office Visit  Subjective   Patient ID: Douglas Bird, male    DOB: 15-Aug-1965  Age: 56 y.o. MRN: [id number]  Chief Complaint  Patient presents with   Follow-up    Follow-up for labs last Thursday.    HPI Douglas Bird   is a 56 year old male who has history of diverticulitis, GERD, hyperlipidemia, hypertension, hypothyroidism, presents for routine follow-up visit , lab review and medication refill.  His labs done on 08/25/2021, triglyceride was '414mg'$  per DL, HDL was 37 mg per DL, and his TSH was 3.280 uIU/ml.  He states that he is compliant with his medications, denies side effects and continues to make healthy lifestyle changes.  Overall, he states that he is doing well and offers no further complaint.   Review of Systems  Constitutional: Negative.   Eyes: Negative.   Respiratory: Negative.    Cardiovascular: Negative.   Neurological: Negative.   Psychiatric/Behavioral: Negative.        Objective:     BP 135/85 (BP Location: Left Arm, Patient Position: Sitting, Cuff Size: Normal)   Pulse 77   Temp 97.9 F (36.6 C) (Oral)   Ht '5\' 8"'$  (1.727 m)   Wt 228 lb 3.2 oz (103.5 kg)   SpO2 97%   BMI 34.70 kg/m  BP Readings from Last 3 Encounters:  09/01/21 135/85  06/02/21 (!) 149/96  04/26/21 136/86   Wt Readings from Last 3 Encounters:  09/01/21 228 lb 3.2 oz (103.5 kg)  06/02/21 233 lb 11.2 oz (106 kg)  04/26/21 221 lb (100.2 kg)      Physical Exam HENT:     Head: Normocephalic and atraumatic.     Mouth/Throat:     Mouth: Mucous membranes are moist.  Eyes:     Extraocular Movements: Extraocular movements intact.     Conjunctiva/sclera: Conjunctivae normal.     Pupils: Pupils are equal, round, and reactive to light.  Cardiovascular:     Rate and Rhythm: Normal rate and regular rhythm.     Pulses: Normal pulses.     Heart sounds: Normal heart sounds.  Pulmonary:     Effort: Pulmonary effort is normal.     Breath sounds: Normal breath sounds.   Abdominal:     General: Bowel sounds are normal.     Palpations: Abdomen is soft.  Skin:    General: Skin is warm.  Neurological:     General: No focal deficit present.     Mental Status: He is alert and oriented to person, place, and time.  Psychiatric:        Mood and Affect: Mood normal.        Behavior: Behavior normal.        Thought Content: Thought content normal.        Judgment: Judgment normal.      No results found for any visits on 09/01/21.  Last CBC Lab Results  Component Value Date   WBC 6.4 01/13/2021   HGB 15.1 01/13/2021   HCT 44.7 01/13/2021   MCV 92 01/13/2021   MCH 31.0 01/13/2021   RDW 12.7 01/13/2021   PLT 213 39/76/7341   Last metabolic panel Lab Results  Component Value Date   GLUCOSE 122 (H) 11/03/2020   NA 139 11/03/2020   K 3.5 03/16/2021   CL 102 11/03/2020   CO2 25 11/03/2020   BUN 20 11/03/2020   CREATININE 0.91 11/03/2020   GFRNONAA >60 11/03/2020   CALCIUM 9.0 11/03/2020  PROT 7.0 09/15/2020   ALBUMIN 4.5 09/15/2020   LABGLOB 2.5 09/15/2020   AGRATIO 1.8 09/15/2020   BILITOT 0.4 09/15/2020   ALKPHOS 95 09/15/2020   AST 27 09/15/2020   ALT 25 09/15/2020   ANIONGAP 12 11/03/2020   Last lipids Lab Results  Component Value Date   CHOL 179 08/25/2021   HDL 37 (L) 08/25/2021   LDLCALC 76 08/25/2021   TRIG 414 (H) 08/25/2021   CHOLHDL 4.8 08/25/2021   Last thyroid functions Lab Results  Component Value Date   TSH 3.280 08/25/2021   T4TOTAL 9.8 06/04/2019      The 10-year ASCVD risk score (Arnett DK, et al., 2019) is: 28.3%    Assessment & Plan:     1. Mixed hyperlipidemia -He will continue current medication, low-fat low-cholesterol diet and exercise as tolerated. - atorvastatin (LIPITOR) 40 MG tablet; Take 1 tablet (40 mg total) by mouth once daily.  Dispense: 90 tablet; Refill: 1  2. Benign hypertension -Blood pressure is under control, he will continue current medication, DASH diet and exercise as  tolerated. - hydrochlorothiazide (MICROZIDE) 12.5 MG capsule; TAKE ONE CAPSULE (12.5 MG) BY MOUTH EVERY DAY  Dispense: 90 capsule; Refill: 1 - lisinopril (ZESTRIL) 20 MG tablet; Take 1 tablet (20 mg total) by mouth once daily.  Dispense: 90 tablet; Refill: 1  3. Hypothyroidism, unspecified type -He is euthyroid, will continue current medication. - levothyroxine (SYNTHROID) 150 MCG tablet; TAKE ONE TABLET BY MOUTH EVERY DAY BEFORE BREAKFAST  Dispense: 90 tablet; Refill: 1  Return in about 13 weeks (around 12/01/2021), or if symptoms worsen or fail to improve.    Minsa Weddington Jerold Coombe, NP

## 2021-09-02 ENCOUNTER — Other Ambulatory Visit: Payer: Self-pay

## 2021-09-06 ENCOUNTER — Ambulatory Visit: Payer: PRIVATE HEALTH INSURANCE | Admitting: Occupational Therapy

## 2021-09-08 ENCOUNTER — Ambulatory Visit: Payer: PRIVATE HEALTH INSURANCE | Attending: Student | Admitting: Occupational Therapy

## 2021-09-08 DIAGNOSIS — L905 Scar conditions and fibrosis of skin: Secondary | ICD-10-CM | POA: Diagnosis present

## 2021-09-08 DIAGNOSIS — M25641 Stiffness of right hand, not elsewhere classified: Secondary | ICD-10-CM | POA: Insufficient documentation

## 2021-09-08 DIAGNOSIS — M79641 Pain in right hand: Secondary | ICD-10-CM | POA: Insufficient documentation

## 2021-09-08 DIAGNOSIS — M25631 Stiffness of right wrist, not elsewhere classified: Secondary | ICD-10-CM | POA: Diagnosis present

## 2021-09-08 NOTE — Therapy (Signed)
[facility] [address] [place], [address] Phone: [phone]   Fax:  [phone]  Occupational Therapy Treatment  Patient Details  Name: Douglas Bird MRN: [id number] Date of Birth: 1965/09/17 Referring Provider (OT): Dr Roland Rack   Encounter Date: 09/08/2021   OT End of Session - 09/08/21 1513     Visit Number 28    Number of Visits 32    Date for OT Re-Evaluation 10/31/21    OT Start Time 0900    OT Stop Time 0951    OT Time Calculation (min) 51 min    Activity Tolerance Patient tolerated treatment well    Behavior During Therapy [facility] for tasks assessed/performed             Past Medical History:  Diagnosis Date   Diverticulitis 2011   GERD (gastroesophageal reflux disease)    Hyperlipidemia    Hypertension    controlled on meds   Hypothyroidism    Thyroid disease     Past Surgical History:  Procedure Laterality Date   CHOLECYSTECTOMY  June 2013   COLONOSCOPY WITH PROPOFOL N/A 04/28/2019   Procedure: COLONOSCOPY WITH PROPOFOL;  Surgeon: Lin Landsman, MD;  Location: [facility];  Service: Endoscopy;  Laterality: N/A;  priority 4   HERNIA REPAIR Bilateral    inguinal   REPAIR EXTENSOR TENDON Right 04/28/2020   Procedure: EXPLORATION OF RIGHT DORSAL HAND LACERATION WITH REPAIR OF INDEX EXTENSOR TENDON LACERATION;  Surgeon: Corky Mull, MD;  Location: [facility];  Service: Orthopedics;  Laterality: Right;   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   Procedure: 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.;  Surgeon: Corky Mull, MD;  Location: [facility];  Service: Orthopedics;  Laterality: Right;   RESECTION OF HAND NEUROMA Right 03/23/2021   Procedure: 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   Subjective Assessment - 09/08/21 0914     Subjective  I did see the doctor at [facility] - I did the MRI and ultrasound.  Told me that there is not really anything he can do for the nerve-there is no other specific area where he can do the procedure. Best [place] so I did not do as much home exercises.  Feels a little stiff but still the same things about this my hand vibration starting the weedeater, the lawnmower hammering, nail gun, staple gun  and pounding.    Pertinent History Pt had saw accident on 04/26/20 at [facility] and then Surgery by Dr Roland Rack on 04/28/20 - Primary repair of right EIP and index EDC tendons.2. Irrigation and debridement with primary repair of right dorsal hand laceration (approximately 10 cm in length). Pt was seen for OT and cont to had nerve pain -11/10/20 had traumatic Neuroma excision -NOW repeat evaluation status post a excision/reimplantation of traumatic neuroma involving the dorsal aspect of the right hand. Surgery was performed by Dr. Roland Rack on 03/23/2021. The patient had previously undergone a excision of painful  neuroma  from the right hand performed in August 2022. The patient was doing well until he was hammering in some fence post and suffered a recurrence of his painful neuroma. The patient presents last week to surgeon off-  reporting no injury or trauma affecting the right hand since his last surgery. He does report numbness and tingling especially in the second and third digits of the right hand. The patient take ibuprofen addition to occasional hydrocodone as needed for discomfort. Pain score today is a 5 out of 10- Refer to OT - pt to keep splint on until appt with OT this date - and to take off  only for HEP and keep on untill follow up appt with surgeon 04/18/21    Patient Stated Goals Want to get the use of my R hand back to be able to work and take care of the animals and yard /garden at home    Currently in Pain? Yes    Pain Score 3     Pain Location Hand    Pain Orientation Right    Pain Descriptors / Indicators Burning;Numbness;Pins and needles    Pain Type Surgical pain    Pain Onset More than a month ago    Pain Frequency Intermittent                [facility] OT Assessment - 09/08/21 0001       AROM   Right Wrist Extension 70 Degrees    Right Wrist Flexion 80 Degrees      Strength   Right Hand Grip (lbs) 90    Right Hand Lateral Pinch 30 lbs    Right Hand 3 Point Pinch 24 lbs    Left Hand Grip (lbs) 90    Left Hand Lateral Pinch 25 lbs    Left Hand 3 Point Pinch 21 lbs      Right Hand AROM   R Index  MCP 0-90 80 Degrees    R Index PIP 0-100 90 Degrees    R Long  MCP 0-90 80 Degrees    R Long PIP 0-100 100 Degrees    R Ring  MCP 0-90 90 Degrees    R Ring PIP 0-100 100 Degrees    R Little  MCP 0-90 90 Degrees    R Little PIP 0-100 90 Degrees                 Patient not seen for about a month.  Patient continues to have issues with the same task involving vibration, pounding, hammering, impact, digging using tools in the yard as well as around the house.      Patient did show increased grip strength but active range of motion was a little stiff this morning and composite fist fisting as well as composite extension and wrist flexion.  Patient's best friend passed away this past week did not do as much home program.   OT Treatments/Exercises (OP) - 09/08/21 0001       Moist Heat Therapy   Number Minutes Moist Heat 5 Minutes    Moist Heat Location Hand   Prior to composite flexion and extension exercises              Done composite flexion for digits.  As well as followed by composite flexion of digits in combination with wrist flexion.  And  then radial nerve glide 5 reps.   Patient do at home in the morning after some moist heat and can repeat  it later in the day.   Also done table slides with a composite extension for wrist   and digits.  10 reps pain-free.  P prior to having patient do 1 kg ball for 2 minutes on rebounder throwing and catching right hand could do for 2 minutes with pain less than a 2/10  Had patient do hammer on a padded towel for a minute.  Patient reported increased pain 2/10 over surgery site dorsal second or third metacarpal and reports also having some numbness at times the last 4 weeks since seeing the surgeon last time.   With increased use patient reports some numbness occurring over the dorsal hand into the metacarpal of the second digit but then feeling returns again.   Reinforced with patient to pay attention what activities irritated-patient reports sometimes just sitting doing nothing it happens.   Patient to continue to look into different materials of rubber to wrap vibration objects to absorb vibration. Also recommend him to look into Vibrastop gloves to use -patient report he does have some padded gloves that he will look into and trying use this week.   Encourage patient again to look into may be changes in his tools to be lighter vibration better and handling with less nerve irritation.          OT Education - 09/08/21 1512     Education Details Home program as well as modifications and activities includes    Person(s) Educated Patient    Methods Explanation;Demonstration;Tactile cues;Verbal cues;Handout    Comprehension Verbal cues required;Returned demonstration;Verbalized understanding              OT Short Term Goals - 04/11/21 1605       OT SHORT TERM GOAL #1   Title Pt to be independent in HEP to tolerate desentitization of different textures/gentle scar massage and full extention of digits to donn gloves  with discomfort less than 2/10    Baseline at rest pain - burning  1-5/10 - numbness over dorsal 2nd and 3rd digits and in webspace- cannot toleate touching of dorsal hand over 2nd and 3rd MC's and incision    Time 4    Period Weeks    Status New    Target Date 05/09/21      OT SHORT TERM GOAL #2   Title R wrist AROM increase to WNL without increase symptoms    Baseline pain over dorsal hand -increase pull - increase with wrist flexion with gravity    Time 3    Period Weeks    Status New    Target Date 05/02/21               OT Long Term Goals - 04/11/21 1604       OT LONG TERM GOAL #1   Title Pt R hand digits flexion increase for pt to touch palm to grip and release IADLs objects with pain less than 2-3/10    Baseline in soft volar splint /cast - except with HEP, pain at rest 1-5/10 pain over dorsal hand    Time 5    Period Weeks    Status New    Target Date 05/16/21      OT LONG TERM GOAL #2   Title Grip strength increase to more than 50% compare to L to grip and pick up objects more than 5 lbs without symptoms    Baseline NT - 2 1/2 wkss/p - volar splint    Time 10  Period Weeks    Status New    Target Date 06/20/21      OT LONG TERM GOAL #3   Title Prehension strength increase to more than 50% compare to L to cut food, open packages, use in farm with animals    Baseline 2 1/2 wks s/p - volar splint    Time 8    Period Weeks    Status New    Target Date 06/06/21      OT LONG TERM GOAL #4   Title Function increas for pt to push ,pull door open, push up from chair, hammer nail , shake bags for feeding animals    Baseline 2 1/2 wks s/p    Time 10    Period Weeks    Status New    Target Date 06/20/21                   Plan - 09/08/21 1513     Clinical Impression Statement Patient is about 15 months post right repair of second digit EIP and EDC with irrigation debridement.  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  Patient had injury at hand and needed a second procedure of excision of painful neuroma of the dorsal hand on 03/23/2021.  Patient is  NOW about 5 1/2  months postop last procedure.  Patient was referred after last visit with surgeon to a colleague at [facility] for consultation for nerve ablation.  Patient seen this physician last week as well as had an MRI ultrasound.  At this time physician did not feel there was anything he could do to help patient with nerve pain.  Patient referred back to Dr.Poggi.  1 of patient's best friends passed away this week did not do as much home exercises.  Pt cont to be limited in vibration, impact, pounding, repetitive 3 point pinch with resistance, digging, lifting and pushing putting force on dorsal 2nd and 3rd MC's.  Addressing composite  AROM and passive range of motion of R digits with wrist flexion to WNL.  At home exercises patient to keep pain under 2/10.  Patient already done a lot of modifications of getting lighter tools, that he does not have to start by a pull, putting insulation tubing to enlarge grips, padded gloves. 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 , yard, farm , Architect  work - Pt can benefit from [place] every other week for 1-3 visits until next visit with surgeon    OT Occupational Profile and History Problem Focused Assessment - Including review of records relating to presenting problem    Occupational performance deficits (Please refer to evaluation for details): ADL's;IADL's;Rest and Sleep;Work;Play;Leisure;Social Participation    Body Structure / Function / Physical Skills ADL;Edema;Dexterity;Decreased knowledge of use of DME;Decreased knowledge of precautions;Flexibility;ROM;UE functional use;Scar mobility;Pain;Strength;IADL;Coordination    Rehab Potential Fair    Clinical Decision Making Limited treatment options, no task modification necessary    Comorbidities Affecting Occupational Performance: None     Modification or Assistance to Complete Evaluation  No modification of tasks or assist necessary to complete eval    OT Frequency Biweekly    OT Duration 8 weeks    OT Treatment/Interventions Self-care/ADL training;Contrast Bath;Fluidtherapy;Paraffin;Therapeutic exercise;Ultrasound;Manual Therapy;Passive range of motion;Scar mobilization;Splinting;Patient/family education;Therapeutic activities    Consulted and Agree with Plan of Care Patient  Patient will benefit from skilled therapeutic intervention in order to improve the following deficits and impairments:   Body Structure / Function / Physical Skills: ADL, Edema, Dexterity, Decreased knowledge of use of DME, Decreased knowledge of precautions, Flexibility, ROM, UE functional use, Scar mobility, Pain, Strength, IADL, Coordination       Visit Diagnosis: Scar condition and fibrosis of skin  Pain in right hand  Stiffness of right hand, not elsewhere classified  Stiffness of right wrist, not elsewhere classified    Problem List Patient Active Problem List   Diagnosis Date Noted   Toothache 02/20/2020   Headache 02/20/2020   Smoking 35/46/5681   Umbilical hernia, incarcerated    Back pain 03/27/2019   Benign hypertension 05/02/2018   Obesity (BMI 30.0-34.9) 04/30/2018   Healthcare maintenance 10/19/2016   Flu vaccine need 04/20/2016   GERD (gastroesophageal reflux disease) 04/01/2015   Gout 04/01/2015   Mixed hyperlipidemia 10/01/2014   Hypothyroidism 05/21/2014    Rosalyn Gess, OTR/L,CLT 09/08/2021, 3:20 PM  [facility] [address], [place], [address] Phone: [phone]   Fax:  [phone]  Name: Douglas Bird MRN: [id number] Date of Birth: [birth year]

## 2021-09-22 ENCOUNTER — Ambulatory Visit: Payer: PRIVATE HEALTH INSURANCE | Attending: Student | Admitting: Occupational Therapy

## 2021-09-22 DIAGNOSIS — M25631 Stiffness of right wrist, not elsewhere classified: Secondary | ICD-10-CM | POA: Diagnosis present

## 2021-09-22 DIAGNOSIS — M25641 Stiffness of right hand, not elsewhere classified: Secondary | ICD-10-CM | POA: Diagnosis present

## 2021-09-22 DIAGNOSIS — L905 Scar conditions and fibrosis of skin: Secondary | ICD-10-CM | POA: Insufficient documentation

## 2021-09-22 DIAGNOSIS — M6281 Muscle weakness (generalized): Secondary | ICD-10-CM | POA: Insufficient documentation

## 2021-09-22 DIAGNOSIS — M79641 Pain in right hand: Secondary | ICD-10-CM | POA: Insufficient documentation

## 2021-09-22 NOTE — Therapy (Signed)
[name] [address], [place], [address] Phone: [phone]   Fax:  [phone]  Occupational Therapy Treatment  Patient Details  Name: Douglas Bird MRN: [id number] Date of Birth: 10-13-1965 Referring Provider (OT): Dr Roland Rack   Encounter Date: 09/22/2021   OT End of Session - 09/22/21 1603     Visit Number 29    Number of Visits 32    Date for OT Re-Evaluation 10/31/21    OT Start Time 0820    OT Stop Time 0900    OT Time Calculation (min) 40 min    Activity Tolerance Patient tolerated treatment well    Behavior During Therapy [facility] for tasks assessed/performed             Past Medical History:  Diagnosis Date   Diverticulitis 2011   GERD (gastroesophageal reflux disease)    Hyperlipidemia    Hypertension    controlled on meds   Hypothyroidism    Thyroid disease     Past Surgical History:  Procedure Laterality Date   CHOLECYSTECTOMY  June 2013   COLONOSCOPY WITH PROPOFOL N/A 04/28/2019   Procedure: COLONOSCOPY WITH PROPOFOL;  Surgeon: Lin Landsman, MD;  Location: [place];  Service: Endoscopy;  Laterality: N/A;  priority 4   HERNIA REPAIR Bilateral    inguinal   REPAIR EXTENSOR TENDON Right 04/28/2020   Procedure: EXPLORATION OF RIGHT DORSAL HAND LACERATION WITH REPAIR OF INDEX EXTENSOR TENDON LACERATION;  Surgeon: Corky Mull, MD;  Location: [facility];  Service: Orthopedics;  Laterality: Right;   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   Procedure: 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.;  Surgeon: Corky Mull, MD;  Location: [facility];  Service: Orthopedics;  Laterality: Right;   RESECTION OF HAND NEUROMA Right 03/23/2021   Procedure: 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   Subjective Assessment - 09/22/21 1601     Subjective  On 4 July my son convinced me to do some waterskiing.  I used to do it.  I could only hold on for about 30 seconds and then had to let go.  Since then my hand had been really sore, pins-and-needles and and numbness.  What I usually can not tolerate tapping over my knuckles given tap now feels okay.    Pertinent History Pt had saw accident on 04/26/20 at [facility] and then Surgery by Dr Roland Rack on 04/28/20 - Primary repair of right EIP and index EDC tendons.2. Irrigation and debridement with primary repair of right dorsal hand laceration (approximately 10 cm in length). Pt was seen for OT and cont to had nerve pain -11/10/20 had traumatic Neuroma excision -NOW repeat evaluation status post a excision/reimplantation of traumatic neuroma involving the dorsal aspect of the right hand. Surgery was performed by Dr. Roland Rack on 03/23/2021. 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 The patient was doing well until he was hammering in some fence post and suffered  a recurrence of his painful neuroma. The patient presents last week to surgeon off-  reporting no injury or trauma affecting the right hand since his last surgery. He does report numbness and tingling especially in the second and third digits of the right hand. The patient take ibuprofen addition to occasional hydrocodone as needed for discomfort. Pain score today is a 5 out of 10- Refer to OT - pt to keep splint on until appt with OT this date - and to take off only for HEP and keep on untill follow up appt with surgeon 04/18/21    Patient Stated Goals Want to get the use of my R hand back to be able to  work and take care of the animals and yard /garden at home    Currently in Pain? Yes    Pain Score 3     Pain Location Hand    Pain Orientation Right    Pain Descriptors / Indicators Numbness;Sore;Tingling;Tightness    Pain Type Surgical pain;Chronic pain    Pain Onset 1 to 4 weeks ago    Pain Frequency Intermittent                        Patient arrived this date after not being seen for 2 weeks.  Patient reports he attempted 2 days ago to [employer].  Was able to hold on for 30 seconds but had increased soreness and pain with numbness over dorsal right second or third metacarpal.  Try to do hot and cold since then to decrease pain.  Coming in soreness 3/10.  Also with increased numbness and pins-and-needles reported.  Able to tap over second and third metacarpal without having to pull away.  With numbness into the second and third webspace. Done with patient active range of motion as well as weightbearing through palm.  Pain staying about a 3/10.  Done manual screwdriver, hammering 4-minute into a towel as well as catch and throw 1-2 kg ball on rebounder with pain being between a 3-4/10. Had increased redness as well as swelling over second or third metacarpal coming in and staying about the same during session.     OT Treatments/Exercises (OP) - 09/22/21 0001       RUE Fluidotherapy   Number Minutes Fluidotherapy 8 Minutes    RUE Fluidotherapy Location Hand;Wrist    Comments Decreased pain and soreness.              After fluidotherapy with contrast patient's soreness and pain decreased to about a 1-2/10. Patient to focus the next 24 to 48 hours on decreasing nerve pain and numbness as well as soreness over the hand with gentle AROM and mostly contrast. If pain and numbness improve patient can return to prior home exercises and functional use on forearm and will follow-up with me prior to doctors visit end of the month with 1 visit.      Patient did report last time  when seen that he had increased numbness at times since seen by Dr. Clabe Seal appointment.    OT Education - 09/22/21 1603     Education Details Home program as well as modifications and activities includes    Person(s) Educated Patient    Methods Explanation;Demonstration;Tactile cues;Verbal cues;Handout    Comprehension Verbal cues required;Returned demonstration;Verbalized understanding              OT Short Term Goals - 04/11/21 1605       OT SHORT TERM GOAL #  1   Title Pt to be independent in HEP to tolerate desentitization of different textures/gentle scar massage and full extention of digits to donn gloves  with discomfort less than 2/10    Baseline at rest pain - burning 1-5/10 - numbness over dorsal 2nd and 3rd digits and in webspace- cannot toleate touching of dorsal hand over 2nd and 3rd MC's and incision    Time 4    Period Weeks    Status New    Target Date 05/09/21      OT SHORT TERM GOAL #2   Title R wrist AROM increase to WNL without increase symptoms    Baseline pain over dorsal hand -increase pull - increase with wrist flexion with gravity    Time 3    Period Weeks    Status New    Target Date 05/02/21               OT Long Term Goals - 04/11/21 1604       OT LONG TERM GOAL #1   Title Pt R hand digits flexion increase for pt to touch palm to grip and release IADLs objects with pain less than 2-3/10    Baseline in soft volar splint /cast - except with HEP, pain at rest 1-5/10 pain over dorsal hand    Time 5    Period Weeks    Status New    Target Date 05/16/21      OT LONG TERM GOAL #2   Title Grip strength increase to more than 50% compare to L to grip and pick up objects more than 5 lbs without symptoms    Baseline NT - 2 1/2 wkss/p - volar splint    Time 10    Period Weeks    Status New    Target Date 06/20/21      OT LONG TERM GOAL #3   Title Prehension strength increase to more than 50% compare to L to cut food, open packages, use in  farm with animals    Baseline 2 1/2 wks s/p - volar splint    Time 8    Period Weeks    Status New    Target Date 06/06/21      OT LONG TERM GOAL #4   Title Function increas for pt to push ,pull door open, push up from chair, hammer nail , shake bags for feeding animals    Baseline 2 1/2 wks s/p    Time 10    Period Weeks    Status New    Target Date 06/20/21                   Plan - 09/22/21 1603     Clinical Impression Statement Patient is about 15 months post right repair of second digit EIP and EDC with irrigation debridement.  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  Patient had injury at hand and needed a second procedure of excision of painful neuroma of the dorsal hand on 03/23/2021.  Patient is  NOW about 5 1/2  months postop last procedure.  Patient was referred after last visit with surgeon to a colleague at [facility] for consultation for nerve ablation.  Patient seen this physician last week as well as had an MRI ultrasound.  At this time physician did not feel there was anything he could do to help patient with nerve pain.  Patient referred back to Dr.Poggi.  Patient arrived this date with increased numbness as well as soreness in the right dorsal hand over second and third metacarpal.  Patient reports he waterski that over 4 July but was able only to hold on for 30 seconds.  Patient pain in clinic was 3-4/10 with hammering onto a towel, using manual screwdriver as well as doing 1-2 kg throwing catch on rebounder.  Was able to touch and tap over second and third metacarpal with patient not pulling away.  Patient did report last time when seen that he had some numbness over second and third metacarpal since last appointment with Dr. Darlina Sicilian.  Patient to focus on decreasing shortness pain and numbness in the next 24 to 48 hours and then continue with home program and  functional use.Marland Kitchen  Pt cont to be limited in vibration, impact, pounding, repetitive 3 point pinch with resistance, digging, lifting and pushing putting force on dorsal 2nd and 3rd MC's.  Addressing composite  AROM and passive range of motion of R digits with wrist flexion to WNL.  At home exercises patient to keep pain under 2/10.  Patient already done a lot of modifications of getting lighter tools, that he does not have to start by a pull, putting insulation tubing to enlarge grips, padded gloves. 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 every other week for 1-3 visits until next visit with surgeon    OT Occupational Profile and History Problem Focused Assessment - Including review of records relating to presenting problem    Occupational performance deficits (Please refer to evaluation for details): ADL's;IADL's;Rest and Sleep;Work;Play;Leisure;Social Participation    Body Structure / Function / Physical Skills ADL;Edema;Dexterity;Decreased knowledge of use of DME;Decreased knowledge of precautions;Flexibility;ROM;UE functional use;Scar mobility;Pain;Strength;IADL;Coordination    Rehab Potential Fair    Clinical Decision Making Limited treatment options, no task modification necessary    Comorbidities Affecting Occupational Performance: None    Modification or Assistance to Complete Evaluation  No modification of tasks or assist necessary to complete eval    OT Frequency Biweekly    OT Duration 8 weeks    OT Treatment/Interventions Self-care/ADL training;Contrast Bath;Fluidtherapy;Paraffin;Therapeutic exercise;Ultrasound;Manual Therapy;Passive range of motion;Scar mobilization;Splinting;Patient/family education;Therapeutic activities    Consulted and Agree with Plan of Care Patient             Patient will benefit from skilled therapeutic intervention in order to improve the following deficits and impairments:    Body Structure / Function / Physical Skills: ADL, Edema, Dexterity, Decreased knowledge of use of DME, Decreased knowledge of precautions, Flexibility, ROM, UE functional use, Scar mobility, Pain, Strength, IADL, Coordination       Visit Diagnosis: Scar condition and fibrosis of skin  Pain in right hand  Stiffness of right hand, not elsewhere classified  Stiffness of right wrist, not elsewhere classified  Muscle weakness (generalized)    Problem List Patient Active Problem List   Diagnosis Date Noted   Toothache 02/20/2020   Headache 02/20/2020   Smoking 09/98/3382   Umbilical hernia, incarcerated    Back pain 03/27/2019   Benign hypertension 05/02/2018   Obesity (BMI 30.0-34.9) 04/30/2018   Healthcare maintenance 10/19/2016   Flu vaccine need 04/20/2016   GERD (gastroesophageal reflux disease) 04/01/2015   Gout 04/01/2015   Mixed hyperlipidemia 10/01/2014   Hypothyroidism 05/21/2014    Rosalyn Gess, OTR/L,CLT 09/22/2021, 4:07 PM  [place]  [facility] [address], [place], [address] Phone: [phone]   Fax:  [phone]  Name: KARAM DUNSON MRN: [id number] Date of Birth: 03-13-1966

## 2021-09-29 DIAGNOSIS — M79641 Pain in right hand: Secondary | ICD-10-CM | POA: Diagnosis present

## 2021-09-29 DIAGNOSIS — M6281 Muscle weakness (generalized): Secondary | ICD-10-CM | POA: Diagnosis present

## 2021-09-29 DIAGNOSIS — M25631 Stiffness of right wrist, not elsewhere classified: Secondary | ICD-10-CM | POA: Diagnosis present

## 2021-09-29 DIAGNOSIS — M25641 Stiffness of right hand, not elsewhere classified: Secondary | ICD-10-CM | POA: Diagnosis present

## 2021-09-29 DIAGNOSIS — L905 Scar conditions and fibrosis of skin: Secondary | ICD-10-CM | POA: Diagnosis present

## 2021-10-05 IMAGING — DX DG HAND COMPLETE 3+V*R*
3 series · 3 of 3 positions shown · non-contrast
Comparison: None.

CLINICAL DATA: 54-year-old male with saw injury to the middle
finger.

EXAM:
RIGHT HAND - COMPLETE 3+ VIEW

[hand ap]
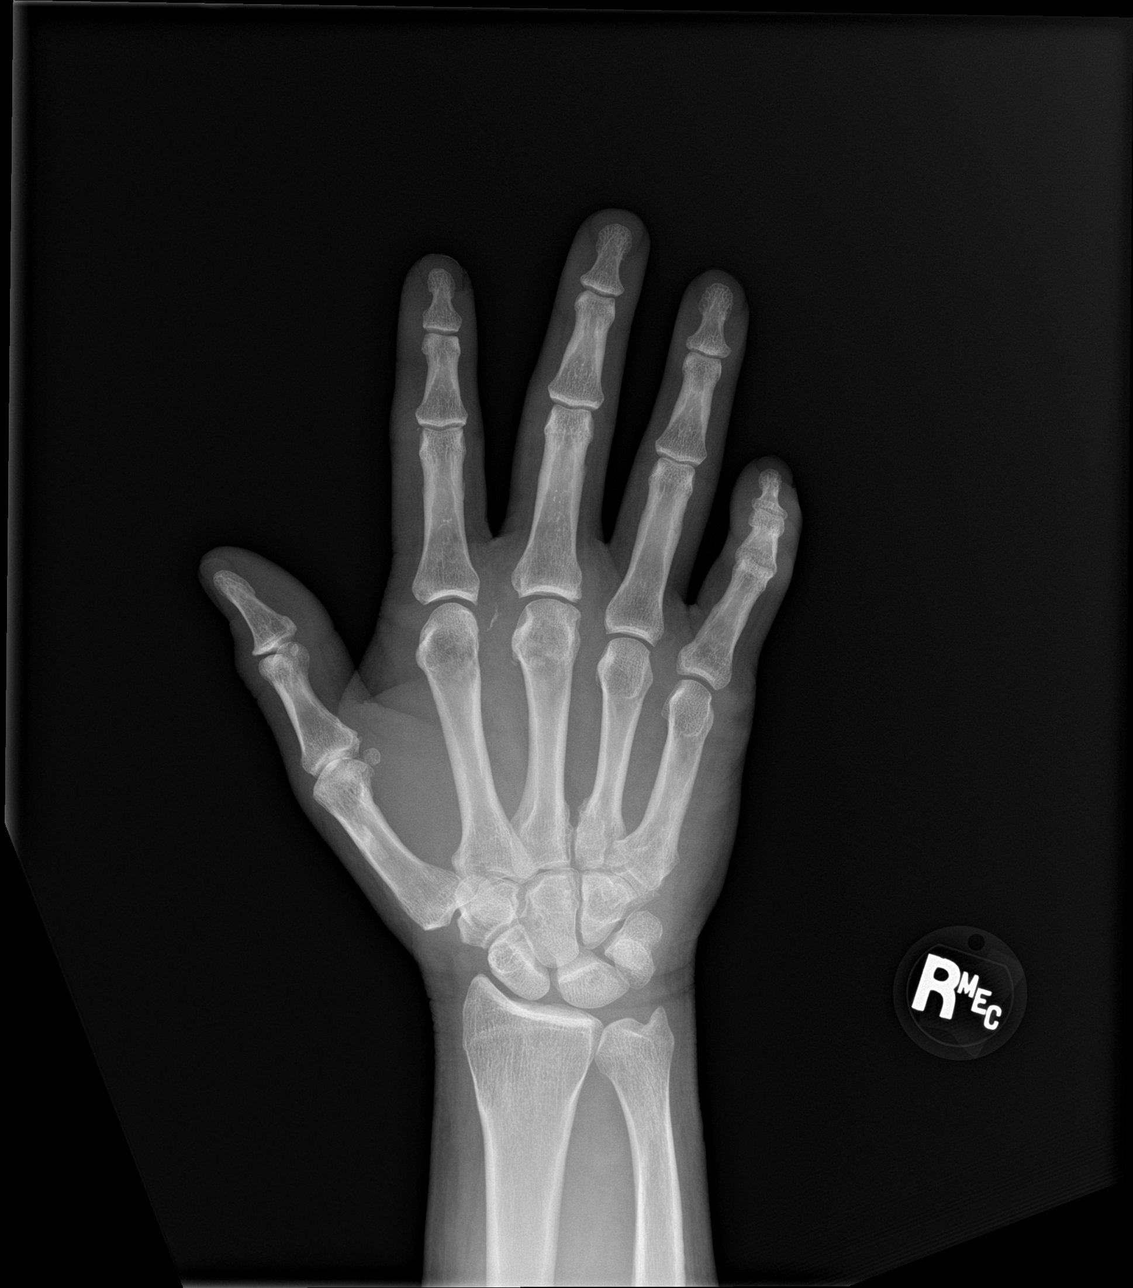

[hand obl]
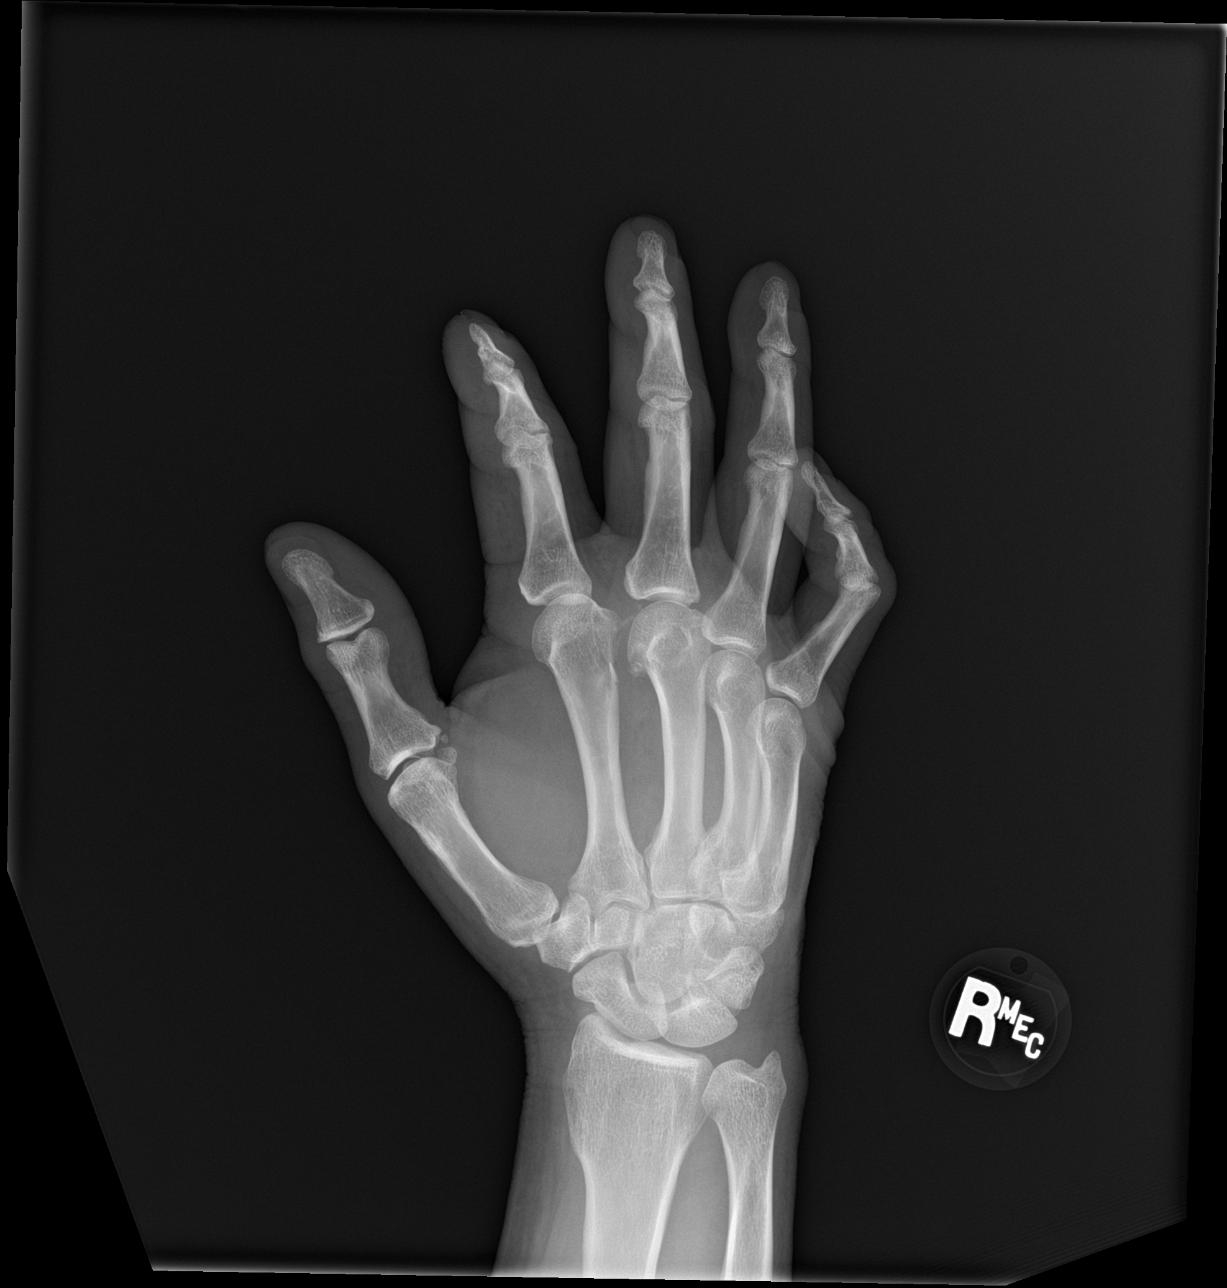

[hand lat]
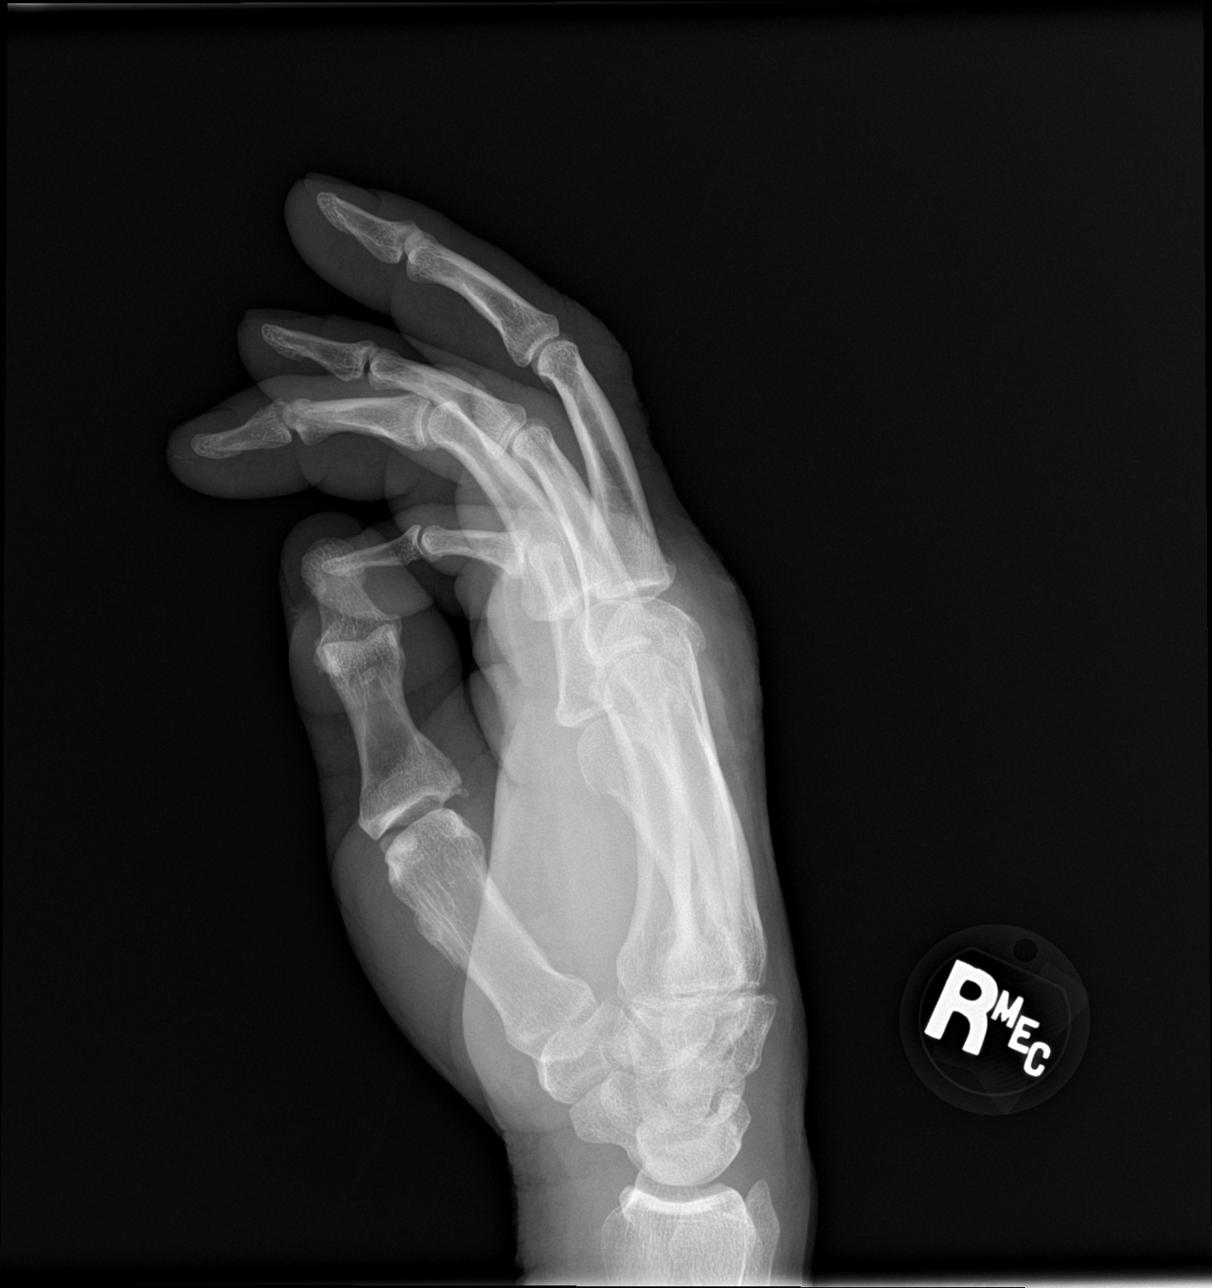

[3 of 3 positions shown; findings below may reference images not displayed]

FINDINGS: There is no acute fracture or dislocation. The bones are well
mineralized. No significant arthritic changes. Possible laceration
of the skin of the dorsum of the proximal third digit. No radiopaque
foreign object or soft tissue gas.
IMPRESSION: No acute fracture or dislocation.

## 2021-10-06 ENCOUNTER — Ambulatory Visit: Payer: PRIVATE HEALTH INSURANCE | Admitting: Occupational Therapy

## 2021-10-06 DIAGNOSIS — M25641 Stiffness of right hand, not elsewhere classified: Secondary | ICD-10-CM

## 2021-10-06 DIAGNOSIS — M79641 Pain in right hand: Secondary | ICD-10-CM

## 2021-10-06 DIAGNOSIS — L905 Scar conditions and fibrosis of skin: Secondary | ICD-10-CM

## 2021-10-06 DIAGNOSIS — M6281 Muscle weakness (generalized): Secondary | ICD-10-CM

## 2021-10-06 DIAGNOSIS — M25631 Stiffness of right wrist, not elsewhere classified: Secondary | ICD-10-CM

## 2021-10-06 NOTE — Therapy (Signed)
[facility] [address] [place], [address] Phone: [phone]   Fax:  [phone]  Occupational Therapy Treatment  Patient Details  Name: Douglas Bird MRN: [id number] Date of Birth: 1966/03/12 Referring Provider (OT): Dr Roland Rack   Encounter Date: 10/06/2021   OT End of Session - 10/06/21 0946     Visit Number 30    Number of Visits 32    Date for OT Re-Evaluation 10/31/21    OT Start Time 0946    OT Stop Time 1015    OT Time Calculation (min) 29 min    Activity Tolerance Patient tolerated treatment well    Behavior During Therapy [facility] for tasks assessed/performed             Past Medical History:  Diagnosis Date   Diverticulitis 2011   GERD (gastroesophageal reflux disease)    Hyperlipidemia    Hypertension    controlled on meds   Hypothyroidism    Thyroid disease     Past Surgical History:  Procedure Laterality Date   CHOLECYSTECTOMY  June 2013   COLONOSCOPY WITH PROPOFOL N/A 04/28/2019   Procedure: COLONOSCOPY WITH PROPOFOL;  Surgeon: Lin Landsman, MD;  Location: [facility];  Service: Endoscopy;  Laterality: N/A;  priority 4   HERNIA REPAIR Bilateral    inguinal   REPAIR EXTENSOR TENDON Right 04/28/2020   Procedure: EXPLORATION OF RIGHT DORSAL HAND LACERATION WITH REPAIR OF INDEX EXTENSOR TENDON LACERATION;  Surgeon: Corky Mull, MD;  Location: [facility];  Service: Orthopedics;  Laterality: Right;   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   Procedure: 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.;  Surgeon: Corky Mull, MD;  Location: [facility];  Service: Orthopedics;  Laterality: Right;   RESECTION OF HAND NEUROMA Right 03/23/2021   Procedure: 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   Subjective Assessment - 10/06/21 0946     Subjective  My hand is still the same than last time [facility].  I think when I did that waterskiing it disrupted my nerves again.  Is numb and then I have that burning pain now constantly like it was the previous time.  2/10 at the base most.  But I am still trying since last time doing exercises, tyring  to use it until I see Dr. Roland Rack Monday    Pertinent History Pt had saw accident on 04/26/20 at [facility] and then Surgery by Dr Roland Rack on 04/28/20 - Primary repair of right EIP and index EDC tendons.2. Irrigation and debridement with primary repair of right dorsal hand laceration (approximately 10 cm in length). Pt was seen for OT and cont to had nerve pain -11/10/20 had traumatic Neuroma excision -NOW repeat evaluation status post a excision/reimplantation of traumatic neuroma involving the dorsal aspect of the right hand. Surgery was performed by Dr. Roland Rack on 03/23/2021. 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 The patient was doing well until he was  hammering in some fence post and suffered a recurrence of his painful neuroma. The patient presents last week to surgeon off-  reporting no injury or trauma affecting the right hand since his last surgery. He does report numbness and tingling especially in the second and third digits of the right hand. The patient take ibuprofen addition to occasional hydrocodone as needed for discomfort. Pain score today is a 5 out of 10- Refer to OT - pt to keep splint on until appt with OT this date - and to take off only for HEP and keep on untill follow up appt with surgeon 04/18/21    Patient Stated Goals Want to get the use  of my R hand back to be able to work and take care of the animals and yard /garden at home                [facility] OT Assessment - 10/06/21 0001       AROM   Right Wrist Extension 50 Degrees    Right Wrist Flexion 80 Degrees      Strength   Right Hand Grip (lbs) 92    Right Hand Lateral Pinch 27 lbs    Right Hand 3 Point Pinch 17 lbs    Left Hand Grip (lbs) 80    Left Hand Lateral Pinch 27 lbs    Left Hand 3 Point Pinch 15 lbs      Right Hand AROM   R Index  MCP 0-90 80 Degrees    R Index PIP 0-100 100 Degrees    R Long  MCP 0-90 80 Degrees    R Long PIP 0-100 100 Degrees    R Ring  MCP 0-90 90 Degrees    R Ring PIP 0-100 100 Degrees    R Little  MCP 0-90 90 Degrees    R Little PIP 0-100 90 Degrees             Patient patient continues since 2 weeks ago with increased numbness and burning pain over dorsal hand over second and third digit into the webspace.   Patient reports he attempted  to [facility] over 4 July.  Was able to hold on for 30 seconds but had increased soreness and pain with numbness over dorsal right second or third metacarpal.  No improvement since seen 2 weeks ago after waterskiing activity and numbness as well as pins-and-needles and burning pain.  Increase in sensitivity as well as positive Tinel.  Patient was able to maintain mostly his active range of motion and grip and prehension strength the last 2 weeks.  Except wrist extension decreased as well as three-point pinch. Patient resting pain is now 2/10 mostly a burning soreness over dorsal hand. Prior to 4 July activity patient had no pain at rest just more tightness.       OT Treatments/Exercises (OP) - 10/06/21 0001       RUE Contrast Bath   Time 8 minutes    Comments End of session decreasing pain               Patient to see surgeon on Monday.  No therapy at this time recommended until further instructions from surgeon or intervention.       OT Education - 10/06/21 1020      Education Details Home program as well as modifications and activities includes    Person(s) Educated Patient    Methods Explanation;Demonstration;Tactile cues;Verbal cues;Handout    Comprehension Verbal cues required;Returned demonstration;Verbalized  understanding              OT Short Term Goals - 04/11/21 1605       OT SHORT TERM GOAL #1   Title Pt to be independent in HEP to tolerate desentitization of different textures/gentle scar massage and full extention of digits to donn gloves  with discomfort less than 2/10    Baseline at rest pain - burning 1-5/10 - numbness over dorsal 2nd and 3rd digits and in webspace- cannot toleate touching of dorsal hand over 2nd and 3rd MC's and incision    Time 4    Period Weeks    Status New    Target Date 05/09/21      OT SHORT TERM GOAL #2   Title R wrist AROM increase to WNL without increase symptoms    Baseline pain over dorsal hand -increase pull - increase with wrist flexion with gravity    Time 3    Period Weeks    Status New    Target Date 05/02/21               OT Long Term Goals - 04/11/21 1604       OT LONG TERM GOAL #1   Title Pt R hand digits flexion increase for pt to touch palm to grip and release IADLs objects with pain less than 2-3/10    Baseline in soft volar splint /cast - except with HEP, pain at rest 1-5/10 pain over dorsal hand    Time 5    Period Weeks    Status New    Target Date 05/16/21      OT LONG TERM GOAL #2   Title Grip strength increase to more than 50% compare to L to grip and pick up objects more than 5 lbs without symptoms    Baseline NT - 2 1/2 wkss/p - volar splint    Time 10    Period Weeks    Status New    Target Date 06/20/21      OT LONG TERM GOAL #3   Title Prehension strength increase to more than 50% compare to L to cut food, open packages, use in farm with animals    Baseline 2 1/2 wks s/p - volar splint    Time 8    Period Weeks    Status New    Target Date 06/06/21       OT LONG TERM GOAL #4   Title Function increas for pt to push ,pull door open, push up from chair, hammer nail , shake bags for feeding animals    Baseline 2 1/2 wks s/p    Time 10    Period Weeks    Status New    Target Date 06/20/21                   Plan - 10/06/21 1021     Clinical Impression Statement Patient is about 15 months post right repair of second digit EIP and EDC with irrigation debridement.  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  Patient had injury at hand and needed a second procedure of excision of painful neuroma of the dorsal hand on 03/23/2021.  Patient is  NOW about 6  months postop last procedure.  Patient was referred by surgeon to a colleague at [facility] for consultation for nerve ablation.  Patient seen this physician about 3  wks ago as well as had an MRI/ ultrasound.  At this time physician did not feel there was anything he could do to help patient with nerve pain.  Patient referred back to Dr.Poggi.   Pt arrive last visit and today with increased numbness as well as burning pain over right dorsal hand over second and third metacarpal.  Patient reports he waterski's over 4 July but was able only to hold on for 30 seconds.  Patient after that with increased numbness as well as increased pain over dorsal hand coming in 2/10.  Patient with increased edema as well as positive Tinel and sensitivity since then over dorsal hand at second and third metacarpal.  Patient tolerance also now worse with activities that involve vibration, impact, pounding, repetitive 3 point pinch with resistance, digging, lifting and pushing putting force on dorsal 2nd and 3rd MC's.  Assess patient's active range of motion and strength this date.  Patient three-point pinch decreased as well as wrist extension.  Otherwise within normal limits still compared to previously.   Patient  already done a lot of modifications of getting lighter tools, that he does not have to start by a pull, putting insulation tubing to enlarge grips, padded gloves. 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 Patient's next follow-up appointment is Monday with surgeon    OT Occupational Profile and History Problem Focused Assessment - Including review of records relating to presenting problem    Occupational performance deficits (Please refer to evaluation for details): ADL's;IADL's;Rest and Sleep;Work;Play;Leisure;Social Participation    Body Structure / Function / Physical Skills ADL;Edema;Dexterity;Decreased knowledge of use of DME;Decreased knowledge of precautions;Flexibility;ROM;UE functional use;Scar mobility;Pain;Strength;IADL;Coordination    Rehab Potential Fair    Clinical Decision Making Limited treatment options, no task modification necessary    Comorbidities Affecting Occupational Performance: None    Modification or Assistance to Complete Evaluation  No modification of tasks or assist necessary to complete eval    OT Frequency Monthly    OT Duration 8 weeks    OT Treatment/Interventions Self-care/ADL training;Contrast Bath;Fluidtherapy;Paraffin;Therapeutic exercise;Ultrasound;Manual Therapy;Passive range of motion;Scar mobilization;Splinting;Patient/family education;Therapeutic activities    Consulted and Agree with Plan of Care Patient             Patient will benefit from skilled therapeutic intervention in order to improve the following deficits and impairments:   Body Structure / Function / Physical Skills: ADL, Edema, Dexterity, Decreased knowledge of use of DME, Decreased knowledge of precautions, Flexibility, ROM, UE functional use, Scar mobility, Pain, Strength, IADL, Coordination       Visit Diagnosis: Scar condition and fibrosis of skin  Pain in right hand  Stiffness of right hand, not elsewhere  classified  Stiffness of right wrist, not elsewhere classified  Muscle weakness (generalized)    Problem List Patient Active Problem List   Diagnosis Date Noted   Toothache 02/20/2020   Headache 02/20/2020   Smoking 82/99/3716   Umbilical hernia, incarcerated    Back pain 03/27/2019   Benign hypertension 05/02/2018   Obesity (BMI 30.0-34.9) 04/30/2018   Healthcare maintenance 10/19/2016   Flu vaccine need 04/20/2016   GERD (gastroesophageal reflux disease) 04/01/2015   Gout 04/01/2015   Mixed hyperlipidemia 10/01/2014   Hypothyroidism 05/21/2014    Rosalyn Gess, OTR/L,CLT 10/06/2021, 10:27 AM  [facility] [address], [place], [address] Phone: [phone]   Fax:  [phone]  Name:  Douglas Bird MRN: [id number] Date of Birth: Jun 30, 1965

## 2021-10-18 ENCOUNTER — Other Ambulatory Visit: Payer: Self-pay

## 2021-10-31 ENCOUNTER — Other Ambulatory Visit: Payer: Self-pay

## 2021-10-31 ENCOUNTER — Other Ambulatory Visit: Payer: Self-pay | Admitting: Gerontology

## 2021-11-01 ENCOUNTER — Other Ambulatory Visit: Payer: Self-pay

## 2021-11-02 ENCOUNTER — Other Ambulatory Visit: Payer: Self-pay

## 2021-11-02 ENCOUNTER — Other Ambulatory Visit: Payer: Self-pay | Admitting: Gerontology

## 2021-11-02 DIAGNOSIS — K219 Gastro-esophageal reflux disease without esophagitis: Secondary | ICD-10-CM

## 2021-11-02 DIAGNOSIS — N529 Male erectile dysfunction, unspecified: Secondary | ICD-10-CM

## 2021-11-02 MED FILL — Omeprazole Cap Delayed Release 20 MG: ORAL | 90 days supply | Qty: 90 | Fill #0 | Status: AC

## 2021-11-22 ENCOUNTER — Other Ambulatory Visit: Payer: Self-pay

## 2021-12-01 ENCOUNTER — Ambulatory Visit: Payer: Self-pay | Admitting: Gerontology

## 2021-12-01 ENCOUNTER — Other Ambulatory Visit: Payer: Self-pay

## 2021-12-01 VITALS — BP 158/98 | HR 69 | Temp 98.0°F | Ht 71.0 in | Wt 226.0 lb

## 2021-12-01 DIAGNOSIS — K219 Gastro-esophageal reflux disease without esophagitis: Secondary | ICD-10-CM

## 2021-12-01 DIAGNOSIS — E039 Hypothyroidism, unspecified: Secondary | ICD-10-CM

## 2021-12-01 DIAGNOSIS — I10 Essential (primary) hypertension: Secondary | ICD-10-CM

## 2021-12-01 DIAGNOSIS — E782 Mixed hyperlipidemia: Secondary | ICD-10-CM

## 2021-12-01 DIAGNOSIS — Z0000 Encounter for general adult medical examination without abnormal findings: Secondary | ICD-10-CM

## 2021-12-01 MED ORDER — ATORVASTATIN CALCIUM 40 MG PO TABS: ORAL_TABLET | ORAL | 1 refills | Status: DC

## 2021-12-01 NOTE — Patient Instructions (Addendum)
Cholesterol Content in Foods Cholesterol is a waxy, fat-like substance that helps to carry fat in the blood. The body needs cholesterol in small amounts, but too much cholesterol can cause damage to the arteries and heart. What foods have cholesterol?  Cholesterol is found in animal-based foods, such as meat, seafood, and dairy. Generally, low-fat dairy and lean meats have less cholesterol than full-fat dairy and fatty meats. The milligrams of cholesterol per serving (mg per serving) of common cholesterol-containing foods are listed below. Meats and other proteins Egg -- one large whole egg has 186 mg. Veal shank -- 4 oz (113 g) has 141 mg. Lean ground turkey (93% lean) -- 4 oz (113 g) has 118 mg. Fat-trimmed lamb loin -- 4 oz (113 g) has 106 mg. Lean ground beef (90% lean) -- 4 oz (113 g) has 100 mg. Lobster -- 3.5 oz (99 g) has 90 mg. Pork loin chops -- 4 oz (113 g) has 86 mg. Canned salmon -- 3.5 oz (99 g) has 83 mg. Fat-trimmed beef top loin -- 4 oz (113 g) has 78 mg. Frankfurter -- 1 frank (3.5 oz or 99 g) has 77 mg. Crab -- 3.5 oz (99 g) has 71 mg. Roasted chicken without skin, white meat -- 4 oz (113 g) has 66 mg. Light bologna -- 2 oz (57 g) has 45 mg. Deli-cut turkey -- 2 oz (57 g) has 31 mg. Canned tuna -- 3.5 oz (99 g) has 31 mg. Bacon -- 1 oz (28 g) has 29 mg. Oysters and mussels (raw) -- 3.5 oz (99 g) has 25 mg. Mackerel -- 1 oz (28 g) has 22 mg. Trout -- 1 oz (28 g) has 20 mg. Pork sausage -- 1 link (1 oz or 28 g) has 17 mg. Salmon -- 1 oz (28 g) has 16 mg. Tilapia -- 1 oz (28 g) has 14 mg. Dairy Soft-serve ice cream --  cup (4 oz or 86 g) has 103 mg. Whole-milk yogurt -- 1 cup (8 oz or 245 g) has 29 mg. Cheddar cheese -- 1 oz (28 g) has 28 mg. American cheese -- 1 oz (28 g) has 28 mg. Whole milk -- 1 cup (8 oz or 250 mL) has 23 mg. 2% milk -- 1 cup (8 oz or 250 mL) has 18 mg. Cream cheese -- 1 tablespoon (Tbsp) (14.5 g) has 15 mg. Cottage cheese --  cup (4 oz or  113 g) has 14 mg. Low-fat (1%) milk -- 1 cup (8 oz or 250 mL) has 10 mg. Sour cream -- 1 Tbsp (12 g) has 8.5 mg. Low-fat yogurt -- 1 cup (8 oz or 245 g) has 8 mg. Nonfat Greek yogurt -- 1 cup (8 oz or 228 g) has 7 mg. Half-and-half cream -- 1 Tbsp (15 mL) has 5 mg. Fats and oils Cod liver oil -- 1 tablespoon (Tbsp) (13.6 g) has 82 mg. Butter -- 1 Tbsp (14 g) has 15 mg. Lard -- 1 Tbsp (12.8 g) has 14 mg. Bacon grease -- 1 Tbsp (12.9 g) has 14 mg. Mayonnaise -- 1 Tbsp (13.8 g) has 5-10 mg. Margarine -- 1 Tbsp (14 g) has 3-10 mg. The items listed above may not be a complete list of foods with cholesterol. Exact amounts of cholesterol in these foods may vary depending on specific ingredients and brands. Contact a dietitian for more information. What foods do not have cholesterol? Most plant-based foods do not have cholesterol unless you combine them with a food that has   cholesterol. Foods without cholesterol include: Grains and cereals. Vegetables. Fruits. Vegetable oils, such as olive, canola, and sunflower oil. Legumes, such as peas, beans, and lentils. Nuts and seeds. Egg whites. The items listed above may not be a complete list of foods that do not have cholesterol. Contact a dietitian for more information. Summary The body needs cholesterol in small amounts, but too much cholesterol can cause damage to the arteries and heart. Cholesterol is found in animal-based foods, such as meat, seafood, and dairy. Generally, low-fat dairy and lean meats have less cholesterol than full-fat dairy and fatty meats. This information is not intended to replace advice given to you by your health care provider. Make sure you discuss any questions you have with your health care provider. Document Revised: 07/16/2020 Document Reviewed: 07/16/2020 Elsevier Patient Education  [place] Eating Plan DASH stands for Dietary Approaches to Stop Hypertension. The DASH eating plan is a healthy eating  plan that has been shown to: Reduce high blood pressure (hypertension). Reduce your risk for type 2 diabetes, heart disease, and stroke. Help with weight loss. What are tips for following this plan? Reading food labels Check food labels for the amount of salt (sodium) per serving. Choose foods with less than 5 percent of the Daily Value of sodium. Generally, foods with less than 300 milligrams (mg) of sodium per serving fit into this eating plan. To find whole grains, look for the word "whole" as the first word in the ingredient list. Shopping Buy products labeled as "low-sodium" or "no salt added." Buy fresh foods. Avoid canned foods and pre-made or frozen meals. Cooking Avoid adding salt when cooking. Use salt-free seasonings or herbs instead of table salt or sea salt. Check with your health care provider or pharmacist before using salt substitutes. Do not fry foods. Cook foods using healthy methods such as baking, boiling, grilling, roasting, and broiling instead. Cook with heart-healthy oils, such as olive, canola, avocado, soybean, or sunflower oil. Meal planning  Eat a balanced diet that includes: 4 or more servings of fruits and 4 or more servings of vegetables each day. Try to fill one-half of your plate with fruits and vegetables. 6-8 servings of whole grains each day. Less than 6 oz (170 g) of lean meat, poultry, or fish each day. A 3-oz (85-g) serving of meat is about the same size as a deck of cards. One egg equals 1 oz (28 g). 2-3 servings of low-fat dairy each day. One serving is 1 cup (237 mL). 1 serving of nuts, seeds, or beans 5 times each week. 2-3 servings of heart-healthy fats. Healthy fats called omega-3 fatty acids are found in foods such as walnuts, flaxseeds, fortified milks, and eggs. These fats are also found in cold-water fish, such as sardines, salmon, and mackerel. Limit how much you eat of: Canned or prepackaged foods. Food that is high in trans fat, such as  some fried foods. Food that is high in saturated fat, such as fatty meat. Desserts and other sweets, sugary drinks, and other foods with added sugar. Full-fat dairy products. Do not salt foods before eating. Do not eat more than 4 egg yolks a week. Try to eat at least 2 vegetarian meals a week. Eat more home-cooked food and less restaurant, buffet, and fast food. Lifestyle When eating at a restaurant, ask that your food be prepared with less salt or no salt, if possible. If you drink alcohol: Limit how much you use to: 0-1 drink a day  for women who are not pregnant. 0-2 drinks a day for men. Be aware of how much alcohol is in your drink. In the [place] one drink equals one 12 oz bottle of beer (355 mL), one 5 oz glass of wine (148 mL), or one 1 oz glass of hard liquor (44 mL). General information Avoid eating more than 2,300 mg of salt a day. If you have hypertension, you may need to reduce your sodium intake to 1,500 mg a day. Work with your health care provider to maintain a healthy body weight or to lose weight. Ask what an ideal weight is for you. Get at least 30 minutes of exercise that causes your heart to beat faster (aerobic exercise) most days of the week. Activities may include walking, swimming, or biking. Work with your health care provider or dietitian to adjust your eating plan to your individual calorie needs. What foods should I eat? Fruits All fresh, dried, or frozen fruit. Canned fruit in natural juice (without added sugar). Vegetables Fresh or frozen vegetables (raw, steamed, roasted, or grilled). Low-sodium or reduced-sodium tomato and vegetable juice. Low-sodium or reduced-sodium tomato sauce and tomato paste. Low-sodium or reduced-sodium canned vegetables. Grains Whole-grain or whole-wheat bread. Whole-grain or whole-wheat pasta. Brown rice. [place]. Bulgur. Whole-grain and low-sodium cereals. Pita bread. Low-fat, low-sodium crackers. Whole-wheat flour  tortillas. Meats and other proteins Skinless chicken or [place]. Ground chicken or [place]. Pork with fat trimmed off. Fish and seafood. Egg whites. Dried beans, peas, or lentils. Unsalted nuts, nut butters, and seeds. Unsalted canned beans. Lean cuts of beef with fat trimmed off. Low-sodium, lean precooked or cured meat, such as sausages or meat loaves. Dairy Low-fat (1%) or fat-free (skim) milk. Reduced-fat, low-fat, or fat-free cheeses. Nonfat, low-sodium ricotta or cottage cheese. Low-fat or nonfat yogurt. Low-fat, low-sodium cheese. Fats and oils Soft margarine without trans fats. Vegetable oil. Reduced-fat, low-fat, or light mayonnaise and salad dressings (reduced-sodium). Canola, safflower, olive, avocado, soybean, and sunflower oils. Avocado. Seasonings and condiments Herbs. Spices. Seasoning mixes without salt. Other foods Unsalted popcorn and pretzels. Fat-free sweets. The items listed above may not be a complete list of foods and beverages you can eat. Contact a dietitian for more information. What foods should I avoid? Fruits Canned fruit in a light or heavy syrup. Fried fruit. Fruit in cream or butter sauce. Vegetables Creamed or fried vegetables. Vegetables in a cheese sauce. Regular canned vegetables (not low-sodium or reduced-sodium). Regular canned tomato sauce and paste (not low-sodium or reduced-sodium). Regular tomato and vegetable juice (not low-sodium or reduced-sodium). Angie Fava. Olives. Grains Baked goods made with fat, such as croissants, muffins, or some breads. Dry pasta or rice meal packs. Meats and other proteins Fatty cuts of meat. Ribs. Fried meat. Berniece Salines. Bologna, salami, and other precooked or cured meats, such as sausages or meat loaves. Fat from the back of a pig (fatback). Bratwurst. Salted nuts and seeds. Canned beans with added salt. Canned or smoked fish. Whole eggs or egg yolks. Chicken or [place] with skin. Dairy Whole or 2% milk, cream, and half-and-half.  Whole or full-fat cream cheese. Whole-fat or sweetened yogurt. Full-fat cheese. Nondairy creamers. Whipped toppings. Processed cheese and cheese spreads. Fats and oils Butter. Stick margarine. Lard. Shortening. Ghee. Bacon fat. Tropical oils, such as coconut, palm kernel, or palm oil. Seasonings and condiments Onion salt, garlic salt, seasoned salt, table salt, and sea salt. Worcestershire sauce. Tartar sauce. Barbecue sauce. Teriyaki sauce. Soy sauce, including reduced-sodium. Steak sauce. Canned and packaged gravies. Fish sauce.  Oyster sauce. Cocktail sauce. Store-bought horseradish. Ketchup. Mustard. Meat flavorings and tenderizers. Bouillon cubes. Hot sauces. Pre-made or packaged marinades. Pre-made or packaged taco seasonings. Relishes. Regular salad dressings. Other foods Salted popcorn and pretzels. The items listed above may not be a complete list of foods and beverages you should avoid. Contact a dietitian for more information. Where to find more information National Heart, Lung, and Blood Institute: https://wilson-eaton.com/ American Heart Association: www.heart.org Academy of Nutrition and Dietetics: www.eatright.[place]: www.kidney.org Summary The DASH eating plan is a healthy eating plan that has been shown to reduce high blood pressure (hypertension). It may also reduce your risk for type 2 diabetes, heart disease, and stroke. When on the DASH eating plan, aim to eat more fresh fruits and vegetables, whole grains, lean proteins, low-fat dairy, and heart-healthy fats. With the DASH eating plan, you should limit salt (sodium) intake to 2,300 mg a day. If you have hypertension, you may need to reduce your sodium intake to 1,500 mg a day. Work with your health care provider or dietitian to adjust your eating plan to your individual calorie needs. This information is not intended to replace advice given to you by your health care provider. Make sure you discuss any questions  you have with your health care provider. Document Revised: 02/07/2019 Document Reviewed: 02/07/2019 Elsevier Patient Education  [place].

## 2021-12-01 NOTE — Progress Notes (Signed)
Established Patient Office Visit  Subjective   Patient ID: Douglas Bird, male    DOB: 09-Jun-1965  Age: 56 y.o. MRN: [id number]  CC: Follow-up for hypertension.    HPI Douglas Bird   is a 56 year old male who has history of diverticulitis, GERD, hyperlipidemia, hypertension, hypothyroidism, presents for routine follow-up visit , lab review and medication refill.  He is compliant with his medications, denies side effects and continues to make healthy lifestyle changes .His blood pressure was elevated today in the clinic, 155/100 and 158/98. He does not routinely check his blood pressure at home, but will begin checking and keep a log. He is compliant with DASH diet and exercises regularly. Overall, he is well and offers no other complaints.    Review of Systems  Constitutional: Negative.   HENT: Negative.    Eyes: Negative.   Respiratory: Negative.    Cardiovascular: Negative.   Gastrointestinal: Negative.   Genitourinary: Negative.   Musculoskeletal: Negative.   Neurological:  Positive for tingling (.right hand from trauma.).  Endo/Heme/Allergies: Negative.   Psychiatric/Behavioral: Negative.     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   Procedure: 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.;  Surgeon: Corky Mull, MD;  Location: [facility];  Service: Orthopedics;  Laterality: Right;   RESECTION OF HAND  NEUROMA Right 03/23/2021   Procedure: 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 BP Readings from Last 3 Encounters:  12/01/21 (!) 155/100  09/01/21 135/85  06/02/21 (!) 149/96   Wt Readings from Last 3 Encounters:  12/01/21 226 lb (102.5 kg)  09/01/21 228 lb 3.2 oz (103.5 kg)  06/02/21 233 lb 11.2 oz (106 kg)      Physical Exam Constitutional:      Appearance: Normal appearance.  HENT:     Head: Normocephalic.     Mouth/Throat:     Mouth: Mucous membranes are moist.  Eyes:     Extraocular Movements: Extraocular movements intact.     Pupils: Pupils are equal, round, and reactive to light.  Cardiovascular:     Rate and Rhythm: Normal rate and regular rhythm.     Pulses: Normal pulses.     Heart sounds: Normal heart sounds.  Pulmonary:     Effort: Pulmonary effort is normal.     Breath sounds: Normal breath sounds.  Musculoskeletal:        General: Normal range of motion.  Skin:    General:  Skin is warm.  Neurological:     General: No focal deficit present.     Mental Status: He is alert and oriented to person, place, and time. Mental status is at baseline.  Psychiatric:        Mood and Affect: Mood normal.        Behavior: Behavior normal.        Thought Content: Thought content normal.        Judgment: Judgment normal.      No results found for any visits on 12/01/21.  Last metabolic panel Lab Results  Component Value Date   GLUCOSE 122 (H) 11/03/2020   NA 139 11/03/2020   K 3.5 03/16/2021   CL 102 11/03/2020   CO2 25  11/03/2020   BUN 20 11/03/2020   CREATININE 0.91 11/03/2020   GFRNONAA >60 11/03/2020   CALCIUM 9.0 11/03/2020   PROT 7.0 09/15/2020   ALBUMIN 4.5 09/15/2020   LABGLOB 2.5 09/15/2020   AGRATIO 1.8 09/15/2020   BILITOT 0.4 09/15/2020   ALKPHOS 95 09/15/2020   AST 27 09/15/2020   ALT 25 09/15/2020   ANIONGAP 12 11/03/2020   Last lipids Lab Results  Component Value Date   CHOL 179 08/25/2021   HDL 37 (L) 08/25/2021   LDLCALC 76 08/25/2021   TRIG 414 (H) 08/25/2021   CHOLHDL 4.8 08/25/2021   Last hemoglobin A1c Lab Results  Component Value Date   HGBA1C 5.3 01/13/2021   Last thyroid functions Lab Results  Component Value Date   TSH 3.280 08/25/2021   T4TOTAL 9.8 06/04/2019      The 10-year ASCVD risk score (Arnett DK, et al., 2019) is: 26.1%    Assessment & Plan:   Problem List Items Addressed This Visit   None 1. Mixed hyperlipidemia - He will continue taking his atorvastatin. Continue on low cholesterol diet.  - atorvastatin (LIPITOR) 40 MG tablet; Take 1 tablet (40 mg total) by mouth once daily.  Dispense: 90 tablet; Refill: 1  2. Hypothyroidism, unspecified type -Continue taking synthroid. Last TSH 3.280 on 08/25/21  3. Gastroesophageal reflux disease, unspecified whether esophagitis present -Continue taking omeprazole 20 mg daily. Avoid triggering foods such as caffeine, chocolate, spicy/acidic foods.   4. Benign hypertension -His blood pressure was elevated today in clinic, 155/100 and 158/98. Blood pressure was okay yesterday at occupational therapy clinic per patient. He will start taking his blood pressure daily and bring a log in next appointment, continue on DASH diet and exercise as tolerated.  5. Health care maintenance - Continue exercise as tolerated and healthy eating habits.  - HgB A1c; Future - Urine Microalbumin w/creat. ratio; Future - Comp Met (CMET); Future   F/u in 3 months.    Rayvon Char

## 2021-12-02 ENCOUNTER — Other Ambulatory Visit: Payer: Self-pay | Admitting: Gerontology

## 2021-12-02 ENCOUNTER — Other Ambulatory Visit: Payer: Self-pay

## 2021-12-02 LAB — MICROALBUMIN / CREATININE URINE RATIO
Creatinine, Urine: 125.9 mg/dL
Microalb/Creat Ratio: <2 mg/g creat (ref 0–29)
Microalbumin, Urine: <3 ug/mL

## 2021-12-02 LAB — COMPREHENSIVE METABOLIC PANEL
ALT: 25 IU/L (ref 0–44)
AST: 19 IU/L (ref 0–40)
Albumin/Globulin Ratio: 1.8 (ref 1.2–2.2)
Albumin: 4.3 g/dL (ref 3.8–4.9)
Alkaline Phosphatase: 95 IU/L (ref 44–121)
BUN/Creatinine Ratio: 15 (ref 9–20)
BUN: 13 mg/dL (ref 6–24)
Bilirubin Total: 0.3 mg/dL (ref 0.0–1.2)
CO2: 25 mmol/L (ref 20–29)
Calcium: 9.6 mg/dL (ref 8.7–10.2)
Chloride: 103 mmol/L (ref 96–106)
Creatinine, Ser: 0.86 mg/dL (ref 0.76–1.27)
Globulin, Total: 2.4 g/dL (ref 1.5–4.5)
Glucose: 98 mg/dL (ref 70–99)
Potassium: 4 mmol/L (ref 3.5–5.2)
Sodium: 142 mmol/L (ref 134–144)
Total Protein: 6.7 g/dL (ref 6.0–8.5)
eGFR: 101 mL/min/{1.73_m2} (ref >59)

## 2021-12-02 LAB — HEMOGLOBIN A1C
Est. average glucose Bld gHb Est-mCnc: 111 mg/dL
Hgb A1c MFr Bld: 5.5 % (ref 4.8–5.6)

## 2021-12-09 ENCOUNTER — Other Ambulatory Visit: Payer: Self-pay

## 2021-12-19 ENCOUNTER — Other Ambulatory Visit: Payer: Self-pay

## 2022-01-17 ENCOUNTER — Other Ambulatory Visit: Payer: Self-pay

## 2022-02-06 ENCOUNTER — Other Ambulatory Visit: Payer: Self-pay

## 2022-02-06 MED FILL — Omeprazole Cap Delayed Release 20 MG: ORAL | 90 days supply | Qty: 90 | Fill #1 | Status: AC

## 2022-02-16 ENCOUNTER — Other Ambulatory Visit: Payer: Self-pay

## 2022-03-02 ENCOUNTER — Other Ambulatory Visit: Payer: Self-pay

## 2022-03-02 ENCOUNTER — Ambulatory Visit: Payer: Self-pay | Admitting: Gerontology

## 2022-03-02 VITALS — BP 142/87 | HR 76 | Wt 231.9 lb

## 2022-03-02 DIAGNOSIS — E782 Mixed hyperlipidemia: Secondary | ICD-10-CM

## 2022-03-02 DIAGNOSIS — Z0000 Encounter for general adult medical examination without abnormal findings: Secondary | ICD-10-CM

## 2022-03-02 DIAGNOSIS — R0602 Shortness of breath: Secondary | ICD-10-CM

## 2022-03-02 DIAGNOSIS — E039 Hypothyroidism, unspecified: Secondary | ICD-10-CM

## 2022-03-02 DIAGNOSIS — K219 Gastro-esophageal reflux disease without esophagitis: Secondary | ICD-10-CM

## 2022-03-02 DIAGNOSIS — Z87438 Personal history of other diseases of male genital organs: Secondary | ICD-10-CM

## 2022-03-02 DIAGNOSIS — I10 Essential (primary) hypertension: Secondary | ICD-10-CM

## 2022-03-02 MED ORDER — LEVOTHYROXINE SODIUM 150 MCG PO TABS
150.0000 ug | ORAL_TABLET | Freq: Every day | ORAL | 1 refills | Status: DC
  Filled 2022-03-02: qty 90, fill #0
  Filled 2022-03-21: qty 90, 90d supply, fill #0
  Filled 2022-06-19: qty 90, 90d supply, fill #1

## 2022-03-02 MED ORDER — OMEPRAZOLE 20 MG PO CPDR
20.0000 mg | DELAYED_RELEASE_CAPSULE | Freq: Every day | ORAL | 1 refills | Status: DC
  Filled 2022-03-02: qty 90, fill #0
  Filled 2022-05-05: qty 90, 90d supply, fill #0

## 2022-03-02 MED ORDER — LISINOPRIL 20 MG PO TABS
20.0000 mg | ORAL_TABLET | Freq: Every day | ORAL | 1 refills | Status: DC
  Filled 2022-03-02: qty 90, 90d supply, fill #0
  Filled 2022-06-12: qty 90, 90d supply, fill #1

## 2022-03-02 MED ORDER — ATORVASTATIN CALCIUM 40 MG PO TABS
ORAL_TABLET | ORAL | 1 refills | Status: DC
  Filled 2022-03-02: qty 90, fill #0

## 2022-03-02 MED ORDER — ATORVASTATIN CALCIUM 40 MG PO TABS
ORAL_TABLET | ORAL | 1 refills | Status: DC
  Filled 2022-03-02: qty 90, 90d supply, fill #0
  Filled 2022-06-12: qty 90, 90d supply, fill #1

## 2022-03-02 MED ORDER — SILDENAFIL CITRATE 100 MG PO TABS
ORAL_TABLET | ORAL | 1 refills | Status: DC
  Filled 2022-03-02: qty 10, fill #0
  Filled 2022-03-03: qty 10, 30d supply, fill #0
  Filled 2022-10-26: qty 10, 30d supply, fill #1

## 2022-03-02 MED ORDER — HYDROCHLOROTHIAZIDE 12.5 MG PO CAPS
ORAL_CAPSULE | ORAL | 1 refills | Status: DC
  Filled 2022-03-02: qty 90, fill #0
  Filled 2022-04-17: qty 90, 90d supply, fill #0
  Filled 2022-07-07: qty 90, 90d supply, fill #1

## 2022-03-02 MED ORDER — ALBUTEROL SULFATE HFA 108 (90 BASE) MCG/ACT IN AERS
1.0000 | INHALATION_SPRAY | Freq: Four times a day (QID) | RESPIRATORY_TRACT | 2 refills | Status: DC | PRN
  Filled 2022-03-02: qty 6.7, 30d supply, fill #0

## 2022-03-02 NOTE — Patient Instructions (Signed)
Heart-Healthy Eating Plan Many factors influence your heart health, including eating and exercise habits. Heart health is also called coronary health. Coronary risk increases with abnormal blood fat (lipid) levels. A heart-healthy eating plan includes limiting unhealthy fats, increasing healthy fats, limiting salt (sodium) intake, and making other diet and lifestyle changes. What is my plan? Your health care provider may recommend that: You limit your fat intake to _________% or less of your total calories each day. You limit your saturated fat intake to _________% or less of your total calories each day. You limit the amount of cholesterol in your diet to less than _________ mg per day. You limit the amount of sodium in your diet to less than _________ mg per day. What are tips for following this plan? Cooking Cook foods using methods other than frying. Baking, boiling, grilling, and broiling are all good options. Other ways to reduce fat include: Removing the skin from poultry. Removing all visible fats from meats. Steaming vegetables in water or broth. Meal planning  At meals, imagine dividing your plate into fourths: Fill one-half of your plate with vegetables and green salads. Fill one-fourth of your plate with whole grains. Fill one-fourth of your plate with lean protein foods. Eat 2-4 cups of vegetables per day. One cup of vegetables equals 1 cup (91 g) broccoli or cauliflower florets, 2 medium carrots, 1 large bell pepper, 1 large sweet potato, 1 large tomato, 1 medium white potato, 2 cups (150 g) raw leafy greens. Eat 1-2 cups of fruit per day. One cup of fruit equals 1 small apple, 1 large banana, 1 cup (237 g) mixed fruit, 1 large orange,  cup (82 g) dried fruit, 1 cup (240 mL) 100% fruit juice. Eat more foods that contain soluble fiber. Examples include apples, broccoli, carrots, beans, peas, and barley. Aim to get 25-30 g of fiber per day. Increase your consumption of legumes,  nuts, and seeds to 4-5 servings per week. One serving of dried beans or legumes equals  cup (90 g) cooked, 1 serving of nuts is  oz (12 almonds, 24 pistachios, or 7 walnut halves), and 1 serving of seeds equals  oz (8 g). Fats Choose healthy fats more often. Choose monounsaturated and polyunsaturated fats, such as olive and canola oils, avocado oil, flaxseeds, walnuts, almonds, and seeds. Eat more omega-3 fats. Choose salmon, mackerel, sardines, tuna, flaxseed oil, and ground flaxseeds. Aim to eat fish at least 2 times each week. Check food labels carefully to identify foods with trans fats or high amounts of saturated fat. Limit saturated fats. These are found in animal products, such as meats, butter, and cream. Plant sources of saturated fats include palm oil, palm kernel oil, and coconut oil. Avoid foods with partially hydrogenated oils in them. These contain trans fats. Examples are stick margarine, some tub margarines, cookies, crackers, and other baked goods. Avoid fried foods. General information Eat more home-cooked food and less restaurant, buffet, and fast food. Limit or avoid alcohol. Limit foods that are high in added sugar and simple starches such as foods made using white refined flour (white breads, pastries, sweets). Lose weight if you are overweight. Losing just 5-10% of your body weight can help your overall health and prevent diseases such as diabetes and heart disease. Monitor your sodium intake, especially if you have high blood pressure. Talk with your health care provider about your sodium intake. Try to incorporate more vegetarian meals weekly. What foods should I eat? Fruits All fresh, canned (in natural   juice), or frozen fruits. Vegetables Fresh or frozen vegetables (raw, steamed, roasted, or grilled). Green salads. Grains Most grains. Choose whole wheat and whole grains most of the time. Rice and pasta, including brown rice and pastas made with whole wheat. Meats  and other proteins Lean, well-trimmed beef, veal, pork, and lamb. Chicken and [place] without skin. All fish and shellfish. Wild duck, rabbit, pheasant, and venison. Egg whites or low-cholesterol egg substitutes. Dried beans, peas, lentils, and tofu. Seeds and most nuts. Dairy Low-fat or nonfat cheeses, including ricotta and mozzarella. Skim or 1% milk (liquid, powdered, or evaporated). Buttermilk made with low-fat milk. Nonfat or low-fat yogurt. Fats and oils Non-hydrogenated (trans-free) margarines. Vegetable oils, including soybean, sesame, sunflower, olive, avocado, peanut, safflower, corn, canola, and cottonseed. Salad dressings or mayonnaise made with a vegetable oil. Beverages Water (mineral or sparkling). Coffee and tea. Unsweetened ice tea. Diet beverages. Sweets and desserts Sherbet, gelatin, and fruit ice. Small amounts of dark chocolate. Limit all sweets and desserts. Seasonings and condiments All seasonings and condiments. The items listed above may not be a complete list of foods and beverages you can eat. Contact a dietitian for more options. What foods should I avoid? Fruits Canned fruit in heavy syrup. Fruit in cream or butter sauce. Fried fruit. Limit coconut. Vegetables Vegetables cooked in cheese, cream, or butter sauce. Fried vegetables. Grains Breads made with saturated or trans fats, oils, or whole milk. Croissants. Sweet rolls. Donuts. High-fat crackers, such as cheese crackers and chips. Meats and other proteins Fatty meats, such as hot dogs, ribs, sausage, bacon, rib-eye roast or steak. High-fat deli meats, such as salami and bologna. Caviar. Domestic duck and goose. Organ meats, such as liver. Dairy Cream, sour cream, cream cheese, and creamed cottage cheese. Whole-milk cheeses. Whole or 2% milk (liquid, evaporated, or condensed). Whole buttermilk. Cream sauce or high-fat cheese sauce. Whole-milk yogurt. Fats and oils Meat fat, or shortening. Cocoa butter,  hydrogenated oils, palm oil, coconut oil, palm kernel oil. Solid fats and shortenings, including bacon fat, salt pork, lard, and butter. Nondairy cream substitutes. Salad dressings with cheese or sour cream. Beverages Regular sodas and any drinks with added sugar. Sweets and desserts Frosting. Pudding. Cookies. Cakes. Pies. Milk chocolate or white chocolate. Buttered syrups. Full-fat ice cream or ice cream drinks. The items listed above may not be a complete list of foods and beverages to avoid. Contact a dietitian for more information. Summary Heart-healthy meal planning includes limiting unhealthy fats, increasing healthy fats, limiting salt (sodium) intake and making other diet and lifestyle changes. Lose weight if you are overweight. Losing just 5-10% of your body weight can help your overall health and prevent diseases such as diabetes and heart disease. Focus on eating a balance of foods, including fruits and vegetables, low-fat or nonfat dairy, lean protein, nuts and legumes, whole grains, and heart-healthy oils and fats. This information is not intended to replace advice given to you by your health care provider. Make sure you discuss any questions you have with your health care provider. Document Revised: 04/11/2021 Document Reviewed: 04/11/2021 Elsevier Patient Education  [place] Eating Plan DASH stands for Dietary Approaches to Stop Hypertension. The DASH eating plan is a healthy eating plan that has been shown to: Reduce high blood pressure (hypertension). Reduce your risk for type 2 diabetes, heart disease, and stroke. Help with weight loss. What are tips for following this plan? Reading food labels Check food labels for the amount of salt (sodium) per serving. Choose  foods with less than 5 percent of the Daily Value of sodium. Generally, foods with less than 300 milligrams (mg) of sodium per serving fit into this eating plan. To find whole grains, look for the word  "whole" as the first word in the ingredient list. Shopping Buy products labeled as "low-sodium" or "no salt added." Buy fresh foods. Avoid canned foods and pre-made or frozen meals. Cooking Avoid adding salt when cooking. Use salt-free seasonings or herbs instead of table salt or sea salt. Check with your health care provider or pharmacist before using salt substitutes. Do not fry foods. Cook foods using healthy methods such as baking, boiling, grilling, roasting, and broiling instead. Cook with heart-healthy oils, such as olive, canola, avocado, soybean, or sunflower oil. Meal planning  Eat a balanced diet that includes: 4 or more servings of fruits and 4 or more servings of vegetables each day. Try to fill one-half of your plate with fruits and vegetables. 6-8 servings of whole grains each day. Less than 6 oz (170 g) of lean meat, poultry, or fish each day. A 3-oz (85-g) serving of meat is about the same size as a deck of cards. One egg equals 1 oz (28 g). 2-3 servings of low-fat dairy each day. One serving is 1 cup (237 mL). 1 serving of nuts, seeds, or beans 5 times each week. 2-3 servings of heart-healthy fats. Healthy fats called omega-3 fatty acids are found in foods such as walnuts, flaxseeds, fortified milks, and eggs. These fats are also found in cold-water fish, such as sardines, salmon, and mackerel. Limit how much you eat of: Canned or prepackaged foods. Food that is high in trans fat, such as some fried foods. Food that is high in saturated fat, such as fatty meat. Desserts and other sweets, sugary drinks, and other foods with added sugar. Full-fat dairy products. Do not salt foods before eating. Do not eat more than 4 egg yolks a week. Try to eat at least 2 vegetarian meals a week. Eat more home-cooked food and less restaurant, buffet, and fast food. Lifestyle When eating at a restaurant, ask that your food be prepared with less salt or no salt, if possible. If you drink  alcohol: Limit how much you use to: 0-1 drink a day for women who are not pregnant. 0-2 drinks a day for men. Be aware of how much alcohol is in your drink. In the [place] one drink equals one 12 oz bottle of beer (355 mL), one 5 oz glass of wine (148 mL), or one 1 oz glass of hard liquor (44 mL). General information Avoid eating more than 2,300 mg of salt a day. If you have hypertension, you may need to reduce your sodium intake to 1,500 mg a day. Work with your health care provider to maintain a healthy body weight or to lose weight. Ask what an ideal weight is for you. Get at least 30 minutes of exercise that causes your heart to beat faster (aerobic exercise) most days of the week. Activities may include walking, swimming, or biking. Work with your health care provider or dietitian to adjust your eating plan to your individual calorie needs. What foods should I eat? Fruits All fresh, dried, or frozen fruit. Canned fruit in natural juice (without added sugar). Vegetables Fresh or frozen vegetables (raw, steamed, roasted, or grilled). Low-sodium or reduced-sodium tomato and vegetable juice. Low-sodium or reduced-sodium tomato sauce and tomato paste. Low-sodium or reduced-sodium canned vegetables. Grains Whole-grain or whole-wheat bread. Whole-grain or  whole-wheat pasta. Brown rice. [place]. Bulgur. Whole-grain and low-sodium cereals. Pita bread. Low-fat, low-sodium crackers. Whole-wheat flour tortillas. Meats and other proteins Skinless chicken or [place]. Ground chicken or [place]. Pork with fat trimmed off. Fish and seafood. Egg whites. Dried beans, peas, or lentils. Unsalted nuts, nut butters, and seeds. Unsalted canned beans. Lean cuts of beef with fat trimmed off. Low-sodium, lean precooked or cured meat, such as sausages or meat loaves. Dairy Low-fat (1%) or fat-free (skim) milk. Reduced-fat, low-fat, or fat-free cheeses. Nonfat, low-sodium ricotta or cottage cheese. Low-fat or  nonfat yogurt. Low-fat, low-sodium cheese. Fats and oils Soft margarine without trans fats. Vegetable oil. Reduced-fat, low-fat, or light mayonnaise and salad dressings (reduced-sodium). Canola, safflower, olive, avocado, soybean, and sunflower oils. Avocado. Seasonings and condiments Herbs. Spices. Seasoning mixes without salt. Other foods Unsalted popcorn and pretzels. Fat-free sweets. The items listed above may not be a complete list of foods and beverages you can eat. Contact a dietitian for more information. What foods should I avoid? Fruits Canned fruit in a light or heavy syrup. Fried fruit. Fruit in cream or butter sauce. Vegetables Creamed or fried vegetables. Vegetables in a cheese sauce. Regular canned vegetables (not low-sodium or reduced-sodium). Regular canned tomato sauce and paste (not low-sodium or reduced-sodium). Regular tomato and vegetable juice (not low-sodium or reduced-sodium). Angie Fava. Olives. Grains Baked goods made with fat, such as croissants, muffins, or some breads. Dry pasta or rice meal packs. Meats and other proteins Fatty cuts of meat. Ribs. Fried meat. Berniece Salines. Bologna, salami, and other precooked or cured meats, such as sausages or meat loaves. Fat from the back of a pig (fatback). Bratwurst. Salted nuts and seeds. Canned beans with added salt. Canned or smoked fish. Whole eggs or egg yolks. Chicken or [place] with skin. Dairy Whole or 2% milk, cream, and half-and-half. Whole or full-fat cream cheese. Whole-fat or sweetened yogurt. Full-fat cheese. Nondairy creamers. Whipped toppings. Processed cheese and cheese spreads. Fats and oils Butter. Stick margarine. Lard. Shortening. Ghee. Bacon fat. Tropical oils, such as coconut, palm kernel, or palm oil. Seasonings and condiments Onion salt, garlic salt, seasoned salt, table salt, and sea salt. Worcestershire sauce. Tartar sauce. Barbecue sauce. Teriyaki sauce. Soy sauce, including reduced-sodium. Steak sauce.  Canned and packaged gravies. Fish sauce. Oyster sauce. Cocktail sauce. Store-bought horseradish. Ketchup. Mustard. Meat flavorings and tenderizers. Bouillon cubes. Hot sauces. Pre-made or packaged marinades. Pre-made or packaged taco seasonings. Relishes. Regular salad dressings. Other foods Salted popcorn and pretzels. The items listed above may not be a complete list of foods and beverages you should avoid. Contact a dietitian for more information. Where to find more information National Heart, Lung, and Blood Institute: https://wilson-eaton.com/ American Heart Association: www.heart.org Academy of Nutrition and Dietetics: www.eatright.[place]: www.kidney.org Summary The DASH eating plan is a healthy eating plan that has been shown to reduce high blood pressure (hypertension). It may also reduce your risk for type 2 diabetes, heart disease, and stroke. When on the DASH eating plan, aim to eat more fresh fruits and vegetables, whole grains, lean proteins, low-fat dairy, and heart-healthy fats. With the DASH eating plan, you should limit salt (sodium) intake to 2,300 mg a day. If you have hypertension, you may need to reduce your sodium intake to 1,500 mg a day. Work with your health care provider or dietitian to adjust your eating plan to your individual calorie needs. This information is not intended to replace advice given to you by your health care provider. Make sure  you discuss any questions you have with your health care provider. Document Revised: 02/07/2019 Document Reviewed: 02/07/2019 Elsevier Patient Education  [place].

## 2022-03-02 NOTE — Progress Notes (Signed)
Established Patient Office Visit  Subjective   Patient ID: REISS MOWREY, male    DOB: [birth year]  Age: 56 y.o. MRN: [id number]  No chief complaint on file.   HPI  Douglas Bird   is a 55 year old male who has history of diverticulitis, GERD, hyperlipidemia, hypertension, hypothyroidism, presents for routine follow-up visit , lab review and medication refill.  He is compliant with his medications, denies side effects and continues to make healthy lifestyle changes . His blood pressure is improving, he denies chest pain, palpitation, and vision changes. Overall, he states that he's doing well and offers no other complaints.    Review of Systems  Constitutional: Negative.   Respiratory: Negative.    Cardiovascular: Negative.   Genitourinary: Negative.   Skin: Negative.   Neurological: Negative.   Endo/Heme/Allergies: Negative.   Psychiatric/Behavioral: Negative.        Objective:     BP (!) 142/87 (BP Location: Right Arm, Patient Position: Sitting, Cuff Size: Large)   Pulse 76   Wt 231 lb 14.4 oz (105.2 kg)   BMI 32.34 kg/m  BP Readings from Last 3 Encounters:  03/02/22 (!) 142/87  12/01/21 (!) 158/98  09/01/21 135/85   Wt Readings from Last 3 Encounters:  03/02/22 231 lb 14.4 oz (105.2 kg)  12/01/21 226 lb (102.5 kg)  09/01/21 228 lb 3.2 oz (103.5 kg)      Physical Exam HENT:     Head: Normocephalic and atraumatic.     Mouth/Throat:     Mouth: Mucous membranes are moist.  Eyes:     Pupils: Pupils are equal, round, and reactive to light.  Cardiovascular:     Rate and Rhythm: Normal rate and regular rhythm.     Pulses: Normal pulses.     Heart sounds: Normal heart sounds.  Pulmonary:     Effort: Pulmonary effort is normal.     Breath sounds: Normal breath sounds.  Skin:    General: Skin is warm.  Neurological:     General: No focal deficit present.     Mental Status: He is alert.  Psychiatric:        Mood and Affect: Mood normal.        Behavior:  Behavior normal.        Thought Content: Thought content normal.        Judgment: Judgment normal.      No results found for any visits on 03/02/22.  Last CBC Lab Results  Component Value Date   WBC 6.4 01/13/2021   HGB 15.1 01/13/2021   HCT 44.7 01/13/2021   MCV 92 01/13/2021   MCH 31.0 01/13/2021   RDW 12.7 01/13/2021   PLT 213 57/84/6962   Last metabolic panel Lab Results  Component Value Date   GLUCOSE 98 12/01/2021   NA 142 12/01/2021   K 4.0 12/01/2021   CL 103 12/01/2021   CO2 25 12/01/2021   BUN 13 12/01/2021   CREATININE 0.86 12/01/2021   EGFR 101 12/01/2021   CALCIUM 9.6 12/01/2021   PROT 6.7 12/01/2021   ALBUMIN 4.3 12/01/2021   LABGLOB 2.4 12/01/2021   AGRATIO 1.8 12/01/2021   BILITOT 0.3 12/01/2021   ALKPHOS 95 12/01/2021   AST 19 12/01/2021   ALT 25 12/01/2021   ANIONGAP 12 11/03/2020   Last lipids Lab Results  Component Value Date   CHOL 179 08/25/2021   HDL 37 (L) 08/25/2021   LDLCALC 76 08/25/2021   TRIG 414 (H) 08/25/2021  CHOLHDL 4.8 08/25/2021   Last hemoglobin A1c Lab Results  Component Value Date   HGBA1C 5.5 12/01/2021   Last thyroid functions Lab Results  Component Value Date   TSH 3.280 08/25/2021   T4TOTAL 9.8 06/04/2019      The 10-year ASCVD risk score (Arnett DK, et al., 2019) is: 32%    Assessment & Plan:    1. Mixed hyperlipidemia - He will continue current medication, low fat/cholesterol diet and exercise as tolerated. - atorvastatin (LIPITOR) 40 MG tablet; Take 1 tablet (40 mg total) by mouth once daily.  Dispense: 90 tablet; Refill: 1  2. Benign hypertension - His blood pressure is improving, he will continue on current medication, DASH diet and exercise as tolerated. - hydrochlorothiazide (MICROZIDE) 12.5 MG capsule; TAKE ONE CAPSULE (12.5 MG) BY MOUTH EVERY DAY  Dispense: 90 capsule; Refill: 1 - lisinopril (ZESTRIL) 20 MG tablet; Take 1 tablet (20 mg total) by mouth once daily.  Dispense: 90 tablet;  Refill: 1  3. Hypothyroidism, unspecified type - He will continue current medication, will recheck TSH in future. - levothyroxine (SYNTHROID) 150 MCG tablet; TAKE ONE TABLET BY MOUTH EVERY DAY BEFORE BREAKFAST  Dispense: 90 tablet; Refill: 1  4. Gastro-esophageal reflux disease without esophagitis - His acid reflux is under control, will continue current medication. -Avoid spicy, fatty and fried food -Avoid sodas and sour juices -Avoid heavy meals -Avoid eating 4 hours before bedtime -Elevate head of bed at night - omeprazole (PRILOSEC) 20 MG capsule; TAKE ONE CAPSULE BY MOUTH ONCE EVERY DAY.  Dispense: 90 capsule; Refill: 1  5. History of erectile dysfunction -He will continue current medication, and follow up with Urology. - sildenafil (VIAGRA) 100 MG tablet; TAKE ONE TABLET BY MOUTH DAILY AS NEEDED FOR ERECTILE DYSFUNCTION  Dispense: 10 tablet; Refill: 1 - Ambulatory referral to Urology  6. Shortness of breath - He will continue current medication as needed. - albuterol (VENTOLIN HFA) 108 (90 Base) MCG/ACT inhaler; Inhale 1-2 puffs into the lungs every 6 (six) hours as needed for wheezing or shortness of breath.  Dispense: 6.7 g; Refill: 2  7. Healthcare maintenance - Routine labs will be checked. - Comp Met (CMET); Future - Lipid panel; Future - CBC w/Diff; Future - HgB A1c; Future - TSH; Future   Return in about 21 weeks (around 07/27/2022).    Omari Koslosky Jerold Coombe, NP

## 2022-03-03 ENCOUNTER — Other Ambulatory Visit: Payer: Self-pay

## 2022-03-21 ENCOUNTER — Other Ambulatory Visit: Payer: Self-pay

## 2022-04-17 ENCOUNTER — Other Ambulatory Visit: Payer: Self-pay

## 2022-05-05 ENCOUNTER — Other Ambulatory Visit: Payer: Self-pay

## 2022-06-12 ENCOUNTER — Other Ambulatory Visit: Payer: Self-pay

## 2022-06-19 ENCOUNTER — Other Ambulatory Visit: Payer: Self-pay

## 2022-07-07 ENCOUNTER — Other Ambulatory Visit: Payer: Self-pay

## 2022-07-12 ENCOUNTER — Other Ambulatory Visit: Payer: Self-pay

## 2022-07-19 ENCOUNTER — Other Ambulatory Visit: Payer: Self-pay

## 2022-07-19 DIAGNOSIS — Z0000 Encounter for general adult medical examination without abnormal findings: Secondary | ICD-10-CM

## 2022-07-20 ENCOUNTER — Other Ambulatory Visit: Payer: Self-pay

## 2022-07-20 LAB — COMPREHENSIVE METABOLIC PANEL
ALT: 24 IU/L (ref 0–44)
AST: 23 IU/L (ref 0–40)
Albumin/Globulin Ratio: 2 (ref 1.2–2.2)
Albumin: 4.3 g/dL (ref 3.8–4.9)
Alkaline Phosphatase: 102 IU/L (ref 44–121)
BUN/Creatinine Ratio: 16 (ref 9–20)
BUN: 16 mg/dL (ref 6–24)
Bilirubin Total: 0.4 mg/dL (ref 0.0–1.2)
CO2: 26 mmol/L (ref 20–29)
Calcium: 9.6 mg/dL (ref 8.7–10.2)
Chloride: 100 mmol/L (ref 96–106)
Creatinine, Ser: 0.97 mg/dL (ref 0.76–1.27)
Globulin, Total: 2.1 g/dL (ref 1.5–4.5)
Glucose: 105 mg/dL — ABNORMAL HIGH (ref 70–99)
Potassium: 4.1 mmol/L (ref 3.5–5.2)
Sodium: 140 mmol/L (ref 134–144)
Total Protein: 6.4 g/dL (ref 6.0–8.5)
eGFR: 91 mL/min/{1.73_m2} (ref >59)

## 2022-07-20 LAB — CBC WITH DIFFERENTIAL/PLATELET
Basophils Absolute: 0 10*3/uL (ref 0.0–0.2)
Basos: 1 %
EOS (ABSOLUTE): 0.2 10*3/uL (ref 0.0–0.4)
Eos: 2 %
Hematocrit: 45.7 % (ref 37.5–51.0)
Hemoglobin: 15.6 g/dL (ref 13.0–17.7)
Immature Grans (Abs): 0 10*3/uL (ref 0.0–0.1)
Immature Granulocytes: 0 %
Lymphocytes Absolute: 1.5 10*3/uL (ref 0.7–3.1)
Lymphs: 19 %
MCH: 31.3 pg (ref 26.6–33.0)
MCHC: 34.1 g/dL (ref 31.5–35.7)
MCV: 92 fL (ref 79–97)
Monocytes Absolute: 0.7 10*3/uL (ref 0.1–0.9)
Monocytes: 8 %
Neutrophils Absolute: 5.8 10*3/uL (ref 1.4–7.0)
Neutrophils: 70 %
Platelets: 220 10*3/uL (ref 150–450)
RBC: 4.99 x10E6/uL (ref 4.14–5.80)
RDW: 12.8 % (ref 11.6–15.4)
WBC: 8.2 10*3/uL (ref 3.4–10.8)

## 2022-07-20 LAB — TSH: TSH: 1.73 u[IU]/mL (ref 0.450–4.500)

## 2022-07-20 LAB — LIPID PANEL
Chol/HDL Ratio: 4.6 ratio (ref 0.0–5.0)
Cholesterol, Total: 158 mg/dL (ref 100–199)
HDL: 34 mg/dL — ABNORMAL LOW (ref >39)
LDL Chol Calc (NIH): 85 mg/dL (ref 0–99)
Triglycerides: 234 mg/dL — ABNORMAL HIGH (ref 0–149)
VLDL Cholesterol Cal: 39 mg/dL (ref 5–40)

## 2022-07-20 LAB — HEMOGLOBIN A1C
Est. average glucose Bld gHb Est-mCnc: 111 mg/dL
Hgb A1c MFr Bld: 5.5 % (ref 4.8–5.6)

## 2022-07-27 ENCOUNTER — Other Ambulatory Visit: Payer: Self-pay

## 2022-07-27 ENCOUNTER — Encounter: Payer: Self-pay | Admitting: Gerontology

## 2022-07-27 ENCOUNTER — Ambulatory Visit: Payer: Self-pay | Admitting: Gerontology

## 2022-07-27 VITALS — BP 128/87 | HR 76 | Temp 98.0°F | Resp 16 | Ht 69.0 in | Wt 226.4 lb

## 2022-07-27 DIAGNOSIS — E782 Mixed hyperlipidemia: Secondary | ICD-10-CM

## 2022-07-27 DIAGNOSIS — E039 Hypothyroidism, unspecified: Secondary | ICD-10-CM

## 2022-07-27 DIAGNOSIS — K219 Gastro-esophageal reflux disease without esophagitis: Secondary | ICD-10-CM

## 2022-07-27 DIAGNOSIS — I10 Essential (primary) hypertension: Secondary | ICD-10-CM

## 2022-07-27 MED ORDER — ATORVASTATIN CALCIUM 40 MG PO TABS
40.0000 mg | ORAL_TABLET | Freq: Every day | ORAL | 1 refills | Status: DC
Start: 2022-07-27 — End: 2023-02-01
  Filled 2022-07-27: qty 90, fill #0
  Filled 2022-09-15: qty 90, 90d supply, fill #0
  Filled 2022-12-18: qty 60, 60d supply, fill #1
  Filled 2022-12-18: qty 30, 30d supply, fill #1

## 2022-07-27 MED ORDER — HYDROCHLOROTHIAZIDE 12.5 MG PO CAPS
ORAL_CAPSULE | ORAL | 1 refills | Status: DC
Start: 2022-07-27 — End: 2023-02-01
  Filled 2022-07-27: qty 90, fill #0
  Filled 2022-10-09: qty 90, 90d supply, fill #0
  Filled 2023-01-07: qty 90, 90d supply, fill #1

## 2022-07-27 MED ORDER — LEVOTHYROXINE SODIUM 150 MCG PO TABS
150.0000 ug | ORAL_TABLET | Freq: Every day | ORAL | 1 refills | Status: DC
Start: 2022-07-27 — End: 2023-02-01
  Filled 2022-07-27 – 2022-09-15 (×2): qty 90, 90d supply, fill #0
  Filled 2022-12-18: qty 90, 90d supply, fill #1

## 2022-07-27 MED ORDER — LISINOPRIL 20 MG PO TABS
20.0000 mg | ORAL_TABLET | Freq: Every day | ORAL | 1 refills | Status: DC
Start: 2022-07-27 — End: 2023-02-01
  Filled 2022-07-27 – 2022-10-02 (×2): qty 90, 90d supply, fill #0
  Filled 2022-12-18: qty 30, 30d supply, fill #1
  Filled 2022-12-18: qty 60, 60d supply, fill #1

## 2022-07-27 MED ORDER — OMEPRAZOLE 20 MG PO CPDR
20.0000 mg | DELAYED_RELEASE_CAPSULE | Freq: Every day | ORAL | 1 refills | Status: DC
Start: 2022-07-27 — End: 2023-01-31
  Filled 2022-07-27: qty 90, 90d supply, fill #0
  Filled 2022-11-07: qty 90, 90d supply, fill #1

## 2022-07-27 NOTE — Progress Notes (Signed)
Established Patient Office Visit  Subjective   Patient ID: Douglas Bird, male    DOB: Jan 29, 1966  Age: 57 y.o. MRN: [id number]  Chief Complaint  Patient presents with   Follow-up    Labs drawn 07/19/22   Hypertension   Hypothyroidism    HPI  Douglas Bird   is a 57 year old male who has history of diverticulitis, GERD, hyperlipidemia, hypertension, hypothyroidism, presents for routine follow-up visit , lab review and medication refill. He states that he is compliant with his medications, denies side effects and continues to make healthy lifestyle changes. His Labs done on 07/19/22 was normal except Triglyceride was 234 mg/dl and HDL was 34 mg/dl. Overall, he states that he's doing well and offers no further complaint.    Review of Systems  Constitutional: Negative.   Eyes: Negative.   Respiratory: Negative.    Cardiovascular: Negative.   Gastrointestinal: Negative.   Genitourinary: Negative.   Musculoskeletal: Negative.   Neurological: Negative.   Endo/Heme/Allergies: Negative.   Psychiatric/Behavioral: Negative.        Objective:     BP 128/87 (BP Location: Left Arm, Patient Position: Sitting, Cuff Size: Large)   Pulse 76   Temp 98 F (36.7 C) (Oral)   Resp 16   Ht 5\' 9"  (1.753 m)   Wt 226 lb 6.4 oz (102.7 kg)   SpO2 95%   BMI 33.43 kg/m  BP Readings from Last 3 Encounters:  07/27/22 128/87  03/02/22 (!) 142/87  12/01/21 (!) 158/98   Wt Readings from Last 3 Encounters:  07/27/22 226 lb 6.4 oz (102.7 kg)  03/02/22 231 lb 14.4 oz (105.2 kg)  12/01/21 226 lb (102.5 kg)      Physical Exam HENT:     Head: Normocephalic and atraumatic.     Mouth/Throat:     Mouth: Mucous membranes are moist.  Eyes:     Pupils: Pupils are equal, round, and reactive to light.  Cardiovascular:     Rate and Rhythm: Normal rate and regular rhythm.     Pulses: Normal pulses.     Heart sounds: Normal heart sounds.  Pulmonary:     Effort: Pulmonary effort is normal.      Breath sounds: Normal breath sounds.  Neurological:     General: No focal deficit present.     Mental Status: He is alert and oriented to person, place, and time. Mental status is at baseline.  Psychiatric:        Mood and Affect: Mood normal.        Behavior: Behavior normal.        Thought Content: Thought content normal.        Judgment: Judgment normal.      No results found for any visits on 07/27/22.  Last CBC Lab Results  Component Value Date   WBC 8.2 07/19/2022   HGB 15.6 07/19/2022   HCT 45.7 07/19/2022   MCV 92 07/19/2022   MCH 31.3 07/19/2022   RDW 12.8 07/19/2022   PLT 220 07/19/2022   Last metabolic panel Lab Results  Component Value Date   GLUCOSE 105 (H) 07/19/2022   NA 140 07/19/2022   K 4.1 07/19/2022   CL 100 07/19/2022   CO2 26 07/19/2022   BUN 16 07/19/2022   CREATININE 0.97 07/19/2022   EGFR 91 07/19/2022   CALCIUM 9.6 07/19/2022   PROT 6.4 07/19/2022   ALBUMIN 4.3 07/19/2022   LABGLOB 2.1 07/19/2022   AGRATIO 2.0 07/19/2022   BILITOT  0.4 07/19/2022   ALKPHOS 102 07/19/2022   AST 23 07/19/2022   ALT 24 07/19/2022   ANIONGAP 12 11/03/2020   Last lipids Lab Results  Component Value Date   CHOL 158 07/19/2022   HDL 34 (L) 07/19/2022   LDLCALC 85 07/19/2022   TRIG 234 (H) 07/19/2022   CHOLHDL 4.6 07/19/2022   Last hemoglobin A1c Lab Results  Component Value Date   HGBA1C 5.5 07/19/2022   Last thyroid functions Lab Results  Component Value Date   TSH 1.730 07/19/2022   T4TOTAL 9.8 06/04/2019   Last vitamin D No results found for: "25OHVITD2", "25OHVITD3", "VD25OH" Last vitamin B12 and Folate No results found for: "VITAMINB12", "FOLATE"    The 10-year ASCVD risk score (Arnett DK, et al., 2019) is: 25.9%    Assessment & Plan:   1. Mixed hyperlipidemia - He will continue current medication, encouraged to continue on low fat/cholesterol diet and exercise as tolerated. - atorvastatin (LIPITOR) 40 MG tablet; Take 1 tablet (40  mg total) by mouth once daily.  Dispense: 90 tablet; Refill: 1  2. Benign hypertension - His blood pressure is under control, will continue current medication, DASH diet and exercise as tolerated. - hydrochlorothiazide (MICROZIDE) 12.5 MG capsule; TAKE ONE CAPSULE (12.5 MG) BY MOUTH EVERY DAY  Dispense: 90 capsule; Refill: 1 - lisinopril (ZESTRIL) 20 MG tablet; Take 1 tablet (20 mg total) by mouth once daily.  Dispense: 90 tablet; Refill: 1  3. Hypothyroidism, unspecified type - His TSH is euthyroid, he will continue current medication. - levothyroxine (SYNTHROID) 150 MCG tablet; Take 1 tablet (150 mcg total) by mouth daily before breakfast  Dispense: 90 tablet; Refill: 1  4. Gastro-esophageal reflux disease without esophagitis - His acid reflux is under control, he will continue current medication. -Avoid spicy, fatty and fried food -Avoid sodas and sour juices -Avoid heavy meals -Avoid eating 4 hours before bedtime -Elevate head of bed at night - omeprazole (PRILOSEC) 20 MG capsule; Take 1 capsule (20 mg total) by mouth daily.  Dispense: 90 capsule; Refill: 1    Return in about 26 weeks (around 01/25/2023), or if symptoms worsen or fail to improve.    Marlen Mollica Trellis Paganini, NP

## 2022-07-27 NOTE — Patient Instructions (Signed)
Heart-Healthy Eating Plan Many factors influence your heart health, including eating and exercise habits. Heart health is also called coronary health. Coronary risk increases with abnormal blood fat (lipid) levels. A heart-healthy eating plan includes limiting unhealthy fats, increasing healthy fats, limiting salt (sodium) intake, and making other diet and lifestyle changes. What is my plan? Your health care provider may recommend that: You limit your fat intake to _________% or less of your total calories each day. You limit your saturated fat intake to _________% or less of your total calories each day. You limit the amount of cholesterol in your diet to less than _________ mg per day. You limit the amount of sodium in your diet to less than _________ mg per day. What are tips for following this plan? Cooking Cook foods using methods other than frying. Baking, boiling, grilling, and broiling are all good options. Other ways to reduce fat include: Removing the skin from poultry. Removing all visible fats from meats. Steaming vegetables in water or broth. Meal planning  At meals, imagine dividing your plate into fourths: Fill one-half of your plate with vegetables and green salads. Fill one-fourth of your plate with whole grains. Fill one-fourth of your plate with lean protein foods. Eat 2-4 cups of vegetables per day. One cup of vegetables equals 1 cup (91 g) broccoli or cauliflower florets, 2 medium carrots, 1 large bell pepper, 1 large sweet potato, 1 large tomato, 1 medium white potato, 2 cups (150 g) raw leafy greens. Eat 1-2 cups of fruit per day. One cup of fruit equals 1 small apple, 1 large banana, 1 cup (237 g) mixed fruit, 1 large orange,  cup (82 g) dried fruit, 1 cup (240 mL) 100% fruit juice. Eat more foods that contain soluble fiber. Examples include apples, broccoli, carrots, beans, peas, and barley. Aim to get 25-30 g of fiber per day. Increase your consumption of legumes,  nuts, and seeds to 4-5 servings per week. One serving of dried beans or legumes equals  cup (90 g) cooked, 1 serving of nuts is  oz (12 almonds, 24 pistachios, or 7 walnut halves), and 1 serving of seeds equals  oz (8 g). Fats Choose healthy fats more often. Choose monounsaturated and polyunsaturated fats, such as olive and canola oils, avocado oil, flaxseeds, walnuts, almonds, and seeds. Eat more omega-3 fats. Choose salmon, mackerel, sardines, tuna, flaxseed oil, and ground flaxseeds. Aim to eat fish at least 2 times each week. Check food labels carefully to identify foods with trans fats or high amounts of saturated fat. Limit saturated fats. These are found in animal products, such as meats, butter, and cream. Plant sources of saturated fats include palm oil, palm kernel oil, and coconut oil. Avoid foods with partially hydrogenated oils in them. These contain trans fats. Examples are stick margarine, some tub margarines, cookies, crackers, and other baked goods. Avoid fried foods. General information Eat more home-cooked food and less restaurant, buffet, and fast food. Limit or avoid alcohol. Limit foods that are high in added sugar and simple starches such as foods made using white refined flour (white breads, pastries, sweets). Lose weight if you are overweight. Losing just 5-10% of your body weight can help your overall health and prevent diseases such as diabetes and heart disease. Monitor your sodium intake, especially if you have high blood pressure. Talk with your health care provider about your sodium intake. Try to incorporate more vegetarian meals weekly. What foods should I eat? Fruits All fresh, canned (in natural   juice), or frozen fruits. Vegetables Fresh or frozen vegetables (raw, steamed, roasted, or grilled). Green salads. Grains Most grains. Choose whole wheat and whole grains most of the time. Rice and pasta, including brown rice and pastas made with whole wheat. Meats  and other proteins Lean, well-trimmed beef, veal, pork, and lamb. Chicken and turkey without skin. All fish and shellfish. Wild duck, rabbit, pheasant, and venison. Egg whites or low-cholesterol egg substitutes. Dried beans, peas, lentils, and tofu. Seeds and most nuts. Dairy Low-fat or nonfat cheeses, including ricotta and mozzarella. Skim or 1% milk (liquid, powdered, or evaporated). Buttermilk made with low-fat milk. Nonfat or low-fat yogurt. Fats and oils Non-hydrogenated (trans-free) margarines. Vegetable oils, including soybean, sesame, sunflower, olive, avocado, peanut, safflower, corn, canola, and cottonseed. Salad dressings or mayonnaise made with a vegetable oil. Beverages Water (mineral or sparkling). Coffee and tea. Unsweetened ice tea. Diet beverages. Sweets and desserts Sherbet, gelatin, and fruit ice. Small amounts of dark chocolate. Limit all sweets and desserts. Seasonings and condiments All seasonings and condiments. The items listed above may not be a complete list of foods and beverages you can eat. Contact a dietitian for more options. What foods should I avoid? Fruits Canned fruit in heavy syrup. Fruit in cream or butter sauce. Fried fruit. Limit coconut. Vegetables Vegetables cooked in cheese, cream, or butter sauce. Fried vegetables. Grains Breads made with saturated or trans fats, oils, or whole milk. Croissants. Sweet rolls. Donuts. High-fat crackers, such as cheese crackers and chips. Meats and other proteins Fatty meats, such as hot dogs, ribs, sausage, bacon, rib-eye roast or steak. High-fat deli meats, such as salami and bologna. Caviar. Domestic duck and goose. Organ meats, such as liver. Dairy Cream, sour cream, cream cheese, and creamed cottage cheese. Whole-milk cheeses. Whole or 2% milk (liquid, evaporated, or condensed). Whole buttermilk. Cream sauce or high-fat cheese sauce. Whole-milk yogurt. Fats and oils Meat fat, or shortening. Cocoa butter,  hydrogenated oils, palm oil, coconut oil, palm kernel oil. Solid fats and shortenings, including bacon fat, salt pork, lard, and butter. Nondairy cream substitutes. Salad dressings with cheese or sour cream. Beverages Regular sodas and any drinks with added sugar. Sweets and desserts Frosting. Pudding. Cookies. Cakes. Pies. Milk chocolate or white chocolate. Buttered syrups. Full-fat ice cream or ice cream drinks. The items listed above may not be a complete list of foods and beverages to avoid. Contact a dietitian for more information. Summary Heart-healthy meal planning includes limiting unhealthy fats, increasing healthy fats, limiting salt (sodium) intake and making other diet and lifestyle changes. Lose weight if you are overweight. Losing just 5-10% of your body weight can help your overall health and prevent diseases such as diabetes and heart disease. Focus on eating a balance of foods, including fruits and vegetables, low-fat or nonfat dairy, lean protein, nuts and legumes, whole grains, and heart-healthy oils and fats. This information is not intended to replace advice given to you by your health care provider. Make sure you discuss any questions you have with your health care provider. Document Revised: 04/11/2021 Document Reviewed: 04/11/2021 Elsevier Patient Education  2023 [employer] DASH Eating Plan DASH stands for Dietary Approaches to Stop Hypertension. The DASH eating plan is a healthy eating plan that has been shown to: Reduce high blood pressure (hypertension). Reduce your risk for type 2 diabetes, heart disease, and stroke. Help with weight loss. What are tips for following this plan? Reading food labels Check food labels for the amount of salt (sodium) per serving. Choose   foods with less than 5 percent of the Daily Value of sodium. Generally, foods with less than 300 milligrams (mg) of sodium per serving fit into this eating plan. To find whole grains, look for the word  "whole" as the first word in the ingredient list. Shopping Buy products labeled as "low-sodium" or "no salt added." Buy fresh foods. Avoid canned foods and pre-made or frozen meals. Cooking Avoid adding salt when cooking. Use salt-free seasonings or herbs instead of table salt or sea salt. Check with your health care provider or pharmacist before using salt substitutes. Do not fry foods. Cook foods using healthy methods such as baking, boiling, grilling, roasting, and broiling instead. Cook with heart-healthy oils, such as olive, canola, avocado, soybean, or sunflower oil. Meal planning  Eat a balanced diet that includes: 4 or more servings of fruits and 4 or more servings of vegetables each day. Try to fill one-half of your plate with fruits and vegetables. 6-8 servings of whole grains each day. Less than 6 oz (170 g) of lean meat, poultry, or fish each day. A 3-oz (85-g) serving of meat is about the same size as a deck of cards. One egg equals 1 oz (28 g). 2-3 servings of low-fat dairy each day. One serving is 1 cup (237 mL). 1 serving of nuts, seeds, or beans 5 times each week. 2-3 servings of heart-healthy fats. Healthy fats called omega-3 fatty acids are found in foods such as walnuts, flaxseeds, fortified milks, and eggs. These fats are also found in cold-water fish, such as sardines, salmon, and mackerel. Limit how much you eat of: Canned or prepackaged foods. Food that is high in trans fat, such as some fried foods. Food that is high in saturated fat, such as fatty meat. Desserts and other sweets, sugary drinks, and other foods with added sugar. Full-fat dairy products. Do not salt foods before eating. Do not eat more than 4 egg yolks a week. Try to eat at least 2 vegetarian meals a week. Eat more home-cooked food and less restaurant, buffet, and fast food. Lifestyle When eating at a restaurant, ask that your food be prepared with less salt or no salt, if possible. If you drink  alcohol: Limit how much you use to: 0-1 drink a day for women who are not pregnant. 0-2 drinks a day for men. Be aware of how much alcohol is in your drink. In the [place] one drink equals one 12 oz bottle of beer (355 mL), one 5 oz glass of wine (148 mL), or one 1 oz glass of hard liquor (44 mL). General information Avoid eating more than 2,300 mg of salt a day. If you have hypertension, you may need to reduce your sodium intake to 1,500 mg a day. Work with your health care provider to maintain a healthy body weight or to lose weight. Ask what an ideal weight is for you. Get at least 30 minutes of exercise that causes your heart to beat faster (aerobic exercise) most days of the week. Activities may include walking, swimming, or biking. Work with your health care provider or dietitian to adjust your eating plan to your individual calorie needs. What foods should I eat? Fruits All fresh, dried, or frozen fruit. Canned fruit in natural juice (without added sugar). Vegetables Fresh or frozen vegetables (raw, steamed, roasted, or grilled). Low-sodium or reduced-sodium tomato and vegetable juice. Low-sodium or reduced-sodium tomato sauce and tomato paste. Low-sodium or reduced-sodium canned vegetables. Grains Whole-grain or whole-wheat bread. Whole-grain or   whole-wheat pasta. Brown rice. Oatmeal. Quinoa. Bulgur. Whole-grain and low-sodium cereals. Pita bread. Low-fat, low-sodium crackers. Whole-wheat flour tortillas. Meats and other proteins Skinless chicken or turkey. Ground chicken or turkey. Pork with fat trimmed off. Fish and seafood. Egg whites. Dried beans, peas, or lentils. Unsalted nuts, nut butters, and seeds. Unsalted canned beans. Lean cuts of beef with fat trimmed off. Low-sodium, lean precooked or cured meat, such as sausages or meat loaves. Dairy Low-fat (1%) or fat-free (skim) milk. Reduced-fat, low-fat, or fat-free cheeses. Nonfat, low-sodium ricotta or cottage cheese. Low-fat or  nonfat yogurt. Low-fat, low-sodium cheese. Fats and oils Soft margarine without trans fats. Vegetable oil. Reduced-fat, low-fat, or light mayonnaise and salad dressings (reduced-sodium). Canola, safflower, olive, avocado, soybean, and sunflower oils. Avocado. Seasonings and condiments Herbs. Spices. Seasoning mixes without salt. Other foods Unsalted popcorn and pretzels. Fat-free sweets. The items listed above may not be a complete list of foods and beverages you can eat. Contact a dietitian for more information. What foods should I avoid? Fruits Canned fruit in a light or heavy syrup. Fried fruit. Fruit in cream or butter sauce. Vegetables Creamed or fried vegetables. Vegetables in a cheese sauce. Regular canned vegetables (not low-sodium or reduced-sodium). Regular canned tomato sauce and paste (not low-sodium or reduced-sodium). Regular tomato and vegetable juice (not low-sodium or reduced-sodium). Pickles. Olives. Grains Baked goods made with fat, such as croissants, muffins, or some breads. Dry pasta or rice meal packs. Meats and other proteins Fatty cuts of meat. Ribs. Fried meat. Bacon. Bologna, salami, and other precooked or cured meats, such as sausages or meat loaves. Fat from the back of a pig (fatback). Bratwurst. Salted nuts and seeds. Canned beans with added salt. Canned or smoked fish. Whole eggs or egg yolks. Chicken or turkey with skin. Dairy Whole or 2% milk, cream, and half-and-half. Whole or full-fat cream cheese. Whole-fat or sweetened yogurt. Full-fat cheese. Nondairy creamers. Whipped toppings. Processed cheese and cheese spreads. Fats and oils Butter. Stick margarine. Lard. Shortening. Ghee. Bacon fat. Tropical oils, such as coconut, palm kernel, or palm oil. Seasonings and condiments Onion salt, garlic salt, seasoned salt, table salt, and sea salt. Worcestershire sauce. Tartar sauce. Barbecue sauce. Teriyaki sauce. Soy sauce, including reduced-sodium. Steak sauce.  Canned and packaged gravies. Fish sauce. Oyster sauce. Cocktail sauce. Store-bought horseradish. Ketchup. Mustard. Meat flavorings and tenderizers. Bouillon cubes. Hot sauces. Pre-made or packaged marinades. Pre-made or packaged taco seasonings. Relishes. Regular salad dressings. Other foods Salted popcorn and pretzels. The items listed above may not be a complete list of foods and beverages you should avoid. Contact a dietitian for more information. Where to find more information National Heart, Lung, and Blood Institute: www.nhlbi.nih.gov American Heart Association: www.heart.org Academy of Nutrition and Dietetics: www.eatright.org National Kidney Foundation: www.kidney.org Summary The DASH eating plan is a healthy eating plan that has been shown to reduce high blood pressure (hypertension). It may also reduce your risk for type 2 diabetes, heart disease, and stroke. When on the DASH eating plan, aim to eat more fresh fruits and vegetables, whole grains, lean proteins, low-fat dairy, and heart-healthy fats. With the DASH eating plan, you should limit salt (sodium) intake to 2,300 mg a day. If you have hypertension, you may need to reduce your sodium intake to 1,500 mg a day. Work with your health care provider or dietitian to adjust your eating plan to your individual calorie needs. This information is not intended to replace advice given to you by your health care provider. Make sure   you discuss any questions you have with your health care provider. Document Revised: 02/07/2019 Document Reviewed: 02/07/2019 Elsevier Patient Education  2023 [employer]  

## 2022-07-31 ENCOUNTER — Other Ambulatory Visit: Payer: Self-pay

## 2022-08-04 ENCOUNTER — Other Ambulatory Visit: Payer: Self-pay

## 2022-08-07 ENCOUNTER — Other Ambulatory Visit: Payer: Self-pay

## 2022-09-15 ENCOUNTER — Other Ambulatory Visit: Payer: Self-pay

## 2022-10-02 ENCOUNTER — Other Ambulatory Visit: Payer: Self-pay

## 2022-10-09 ENCOUNTER — Other Ambulatory Visit: Payer: Self-pay

## 2022-10-26 ENCOUNTER — Other Ambulatory Visit: Payer: Self-pay

## 2022-11-07 ENCOUNTER — Other Ambulatory Visit: Payer: Self-pay

## 2022-11-15 ENCOUNTER — Other Ambulatory Visit: Payer: Self-pay

## 2022-12-18 ENCOUNTER — Other Ambulatory Visit: Payer: Self-pay

## 2023-01-07 ENCOUNTER — Other Ambulatory Visit: Payer: Self-pay

## 2023-01-08 ENCOUNTER — Other Ambulatory Visit: Payer: Self-pay

## 2023-01-25 ENCOUNTER — Ambulatory Visit: Payer: Self-pay | Admitting: Gerontology

## 2023-01-31 ENCOUNTER — Other Ambulatory Visit: Payer: Self-pay | Admitting: Gerontology

## 2023-01-31 ENCOUNTER — Other Ambulatory Visit: Payer: Self-pay

## 2023-01-31 DIAGNOSIS — K219 Gastro-esophageal reflux disease without esophagitis: Secondary | ICD-10-CM

## 2023-01-31 MED ORDER — OMEPRAZOLE 20 MG PO CPDR
20.0000 mg | DELAYED_RELEASE_CAPSULE | Freq: Every day | ORAL | 1 refills | Status: DC
Start: 2023-01-31 — End: 2023-05-03
  Filled 2023-01-31: qty 90, 90d supply, fill #0
  Filled 2023-04-30: qty 90, 90d supply, fill #1

## 2023-02-01 ENCOUNTER — Other Ambulatory Visit: Payer: Self-pay

## 2023-02-01 ENCOUNTER — Ambulatory Visit: Payer: Self-pay | Admitting: Gerontology

## 2023-02-01 ENCOUNTER — Encounter: Payer: Self-pay | Admitting: Gerontology

## 2023-02-01 VITALS — BP 136/90 | HR 70 | Ht 68.0 in | Wt 223.0 lb

## 2023-02-01 DIAGNOSIS — Z0000 Encounter for general adult medical examination without abnormal findings: Secondary | ICD-10-CM

## 2023-02-01 DIAGNOSIS — I10 Essential (primary) hypertension: Secondary | ICD-10-CM

## 2023-02-01 DIAGNOSIS — E782 Mixed hyperlipidemia: Secondary | ICD-10-CM

## 2023-02-01 DIAGNOSIS — E039 Hypothyroidism, unspecified: Secondary | ICD-10-CM

## 2023-02-01 MED ORDER — LISINOPRIL 20 MG PO TABS
20.0000 mg | ORAL_TABLET | Freq: Every day | ORAL | 1 refills | Status: DC
  Filled 2023-02-01 – 2023-03-29 (×2): qty 90, 90d supply, fill #0
  Filled 2023-07-16: qty 90, 90d supply, fill #1

## 2023-02-01 MED ORDER — LEVOTHYROXINE SODIUM 150 MCG PO TABS
150.0000 ug | ORAL_TABLET | Freq: Every day | ORAL | 1 refills | Status: DC
Start: 2023-02-01 — End: 2023-05-03
  Filled 2023-02-01 – 2023-03-13 (×2): qty 90, 90d supply, fill #0

## 2023-02-01 MED ORDER — HYDROCHLOROTHIAZIDE 12.5 MG PO CAPS
12.5000 mg | ORAL_CAPSULE | Freq: Every day | ORAL | 1 refills | Status: DC
Start: 2023-02-01 — End: 2023-07-31
  Filled 2023-02-01: qty 90, fill #0
  Filled 2023-04-13: qty 90, 90d supply, fill #0
  Filled 2023-07-16: qty 90, 90d supply, fill #1

## 2023-02-01 MED ORDER — ATORVASTATIN CALCIUM 40 MG PO TABS
40.0000 mg | ORAL_TABLET | Freq: Every day | ORAL | 1 refills | Status: DC
Start: 2023-02-01 — End: 2023-07-31
  Filled 2023-02-01 – 2023-03-13 (×2): qty 90, 90d supply, fill #0
  Filled 2023-06-12: qty 90, 90d supply, fill #1

## 2023-02-01 NOTE — Progress Notes (Signed)
Established Patient Office Visit  Subjective   Patient ID: Douglas Bird, male    DOB: Nov 25, 1965  Age: 57 y.o. MRN: [id number]  Chief Complaint  Patient presents with   Follow-up    HPI  Douglas Bird   is a 57 year old male who has history of diverticulitis, GERD, hyperlipidemia, hypertension, hypothyroidism, presents for routine follow-up visit and medication refill. He states that he is compliant with his medications, denies side effects and continues to make healthy lifestyle changes. His Labs done on 07/19/22 was normal except Triglyceride was 234 mg/dl and HDL was 34 mg/dl. He is on Lipitor 40 mg daily. At today's vist, his Bp 142/95 mmHg, rechecked Bp 136/90 mmHg. He denies headache, chest pain, or palpitation. He reports he is back to smoke 3 months ago, smoking 1 pack cigarettes every 2 days. He denies SOB, chest pain, palpitation. Overall, he states that he's doing well and offers no further complaint.   Review of Systems  Constitutional: Negative.   HENT: Negative.    Eyes: Negative.   Respiratory: Negative.         Using inhaler once a month when mowing the lawn. He understand using mask on his face when mowing the lawn.  Cardiovascular: Negative.   Gastrointestinal: Negative.   Genitourinary: Negative.   Skin: Negative.      Objective:     BP (!) 136/90 (BP Location: Right Arm, Patient Position: Sitting)   Pulse 70   Ht 5\' 8"  (1.727 m)   Wt 223 lb (101.2 kg)   SpO2 93%   BMI 33.91 kg/m  BP Readings from Last 3 Encounters:  02/01/23 (!) 136/90  07/27/22 128/87  03/02/22 (!) 142/87   Wt Readings from Last 3 Encounters:  02/01/23 223 lb (101.2 kg)  07/27/22 226 lb 6.4 oz (102.7 kg)  03/02/22 231 lb 14.4 oz (105.2 kg)   Physical Exam Constitutional:      Appearance: Normal appearance.  HENT:     Head: Normocephalic.     Nose: Nose normal.  Eyes:     Extraocular Movements: Extraocular movements intact.     Pupils: Pupils are equal, round, and reactive  to light.     Comments: Wearing glasses  Cardiovascular:     Rate and Rhythm: Normal rate and regular rhythm.     Pulses: Normal pulses.     Heart sounds: Normal heart sounds.  Pulmonary:     Breath sounds: Wheezing present.     Comments: He denies SOB. He is smoker, a pack cigarettes every 2 days. Abdominal:     General: Abdomen is flat.     Palpations: Abdomen is soft.  Genitourinary:    Penis: Normal.      Testes: Normal.  Musculoskeletal:        General: Normal range of motion.     Cervical back: Normal range of motion and neck supple.  Skin:    General: Skin is warm and dry.  Neurological:     General: No focal deficit present.     Mental Status: He is alert and oriented to person, place, and time.  Psychiatric:        Mood and Affect: Mood normal.        Behavior: Behavior normal.    No results found for any visits on 02/01/23.  Last CBC Lab Results  Component Value Date   WBC 8.2 07/19/2022   HGB 15.6 07/19/2022   HCT 45.7 07/19/2022   MCV 92 07/19/2022  MCH 31.3 07/19/2022   RDW 12.8 07/19/2022   PLT 220 07/19/2022   Last metabolic panel Lab Results  Component Value Date   GLUCOSE 105 (H) 07/19/2022   NA 140 07/19/2022   K 4.1 07/19/2022   CL 100 07/19/2022   CO2 26 07/19/2022   BUN 16 07/19/2022   CREATININE 0.97 07/19/2022   EGFR 91 07/19/2022   CALCIUM 9.6 07/19/2022   PROT 6.4 07/19/2022   ALBUMIN 4.3 07/19/2022   LABGLOB 2.1 07/19/2022   AGRATIO 2.0 07/19/2022   BILITOT 0.4 07/19/2022   ALKPHOS 102 07/19/2022   AST 23 07/19/2022   ALT 24 07/19/2022   ANIONGAP 12 11/03/2020   Last lipids Lab Results  Component Value Date   CHOL 158 07/19/2022   HDL 34 (L) 07/19/2022   LDLCALC 85 07/19/2022   TRIG 234 (H) 07/19/2022   CHOLHDL 4.6 07/19/2022   Last hemoglobin A1c Lab Results  Component Value Date   HGBA1C 5.5 07/19/2022   Last thyroid functions Lab Results  Component Value Date   TSH 1.730 07/19/2022   T4TOTAL 9.8 06/04/2019    Last vitamin D No results found for: "25OHVITD2", "25OHVITD3", "VD25OH" Last vitamin B12 and Folate No results found for: "VITAMINB12", "FOLATE"    The 10-year ASCVD risk score (Arnett DK, et al., 2019) is: 29.8%    Assessment & Plan:  1. Mixed hyperlipidemia He was encouraged on DASH diet and exercise as tolerated. He will continue on current medications. Will check his lipid panel today. - atorvastatin (LIPITOR) 40 MG tablet; Take 1 tablet (40 mg total) by mouth daily.  Dispense: 90 tablet; Refill: 1 - Lipid panel  2. Benign hypertension During office visit, his Bp 142/95 mmHg, rechecked 136/90 mmHg, Hr 70. He denies headache, chest pain, and palpitation. He was encouraged to check Bp at home and bring the log to follow up appointment. He was encouraged on DASH diet and exercise as tolerated. He will continue on current medication regimen. - hydrochlorothiazide (MICROZIDE) 12.5 MG capsule; Take 1 capsule (12.5 mg total) by mouth daily.  Dispense: 90 capsule; Refill: 1 - lisinopril (ZESTRIL) 20 MG tablet; Take 1 tablet (20 mg total) by mouth once daily.  Dispense: 90 tablet; Refill: 1  3. Hypothyroidism, unspecified type He denies weakness and fatigue. He will continue on current medication regimen. - levothyroxine (SYNTHROID) 150 MCG tablet; Take 1 tablet (150 mcg total) by mouth daily before breakfast  Dispense: 90 tablet; Refill: 1  4. Healthcare maintenance He will take the following exams. - Flu vaccine trivalent PF, 6mos and older(Flulaval,Afluria,Fluarix,Fluzone) - POCT HgB A1C - Urine Microalbumin w/creat. ratio - CT CHEST LUNG CA SCREEN LOW DOSE W/O CM; Future     Follow up in 3 months (05/03/2023) in clinic.   Chioma Trellis Paganini, NP

## 2023-02-01 NOTE — Patient Instructions (Signed)

## 2023-02-02 LAB — MICROALBUMIN / CREATININE URINE RATIO
Creatinine, Urine: 104.7 mg/dL
Microalb/Creat Ratio: 3 mg/g{creat} (ref 0–29)
Microalbumin, Urine: 3.1 ug/mL

## 2023-02-21 ENCOUNTER — Other Ambulatory Visit: Payer: Self-pay | Admitting: Gerontology

## 2023-02-21 DIAGNOSIS — F17200 Nicotine dependence, unspecified, uncomplicated: Secondary | ICD-10-CM

## 2023-03-13 ENCOUNTER — Other Ambulatory Visit: Payer: Self-pay

## 2023-03-15 ENCOUNTER — Other Ambulatory Visit: Payer: Self-pay

## 2023-03-29 ENCOUNTER — Other Ambulatory Visit: Payer: Self-pay

## 2023-04-13 ENCOUNTER — Other Ambulatory Visit: Payer: Self-pay

## 2023-04-30 ENCOUNTER — Other Ambulatory Visit: Payer: Self-pay | Admitting: Gerontology

## 2023-04-30 ENCOUNTER — Other Ambulatory Visit: Payer: Self-pay

## 2023-04-30 DIAGNOSIS — R0602 Shortness of breath: Secondary | ICD-10-CM

## 2023-05-01 ENCOUNTER — Other Ambulatory Visit: Payer: Self-pay

## 2023-05-01 MED FILL — Albuterol Sulfate Inhal Aero 108 MCG/ACT (90MCG Base Equiv): RESPIRATORY_TRACT | 30 days supply | Qty: 6.7 | Fill #0 | Status: AC

## 2023-05-03 ENCOUNTER — Other Ambulatory Visit: Payer: Self-pay

## 2023-05-03 ENCOUNTER — Encounter: Payer: Self-pay | Admitting: Gerontology

## 2023-05-03 ENCOUNTER — Ambulatory Visit: Payer: Self-pay | Admitting: Gerontology

## 2023-05-03 VITALS — BP 143/89 | HR 68 | Ht 68.0 in | Wt 223.2 lb

## 2023-05-03 DIAGNOSIS — Z87438 Personal history of other diseases of male genital organs: Secondary | ICD-10-CM

## 2023-05-03 DIAGNOSIS — F17200 Nicotine dependence, unspecified, uncomplicated: Secondary | ICD-10-CM

## 2023-05-03 DIAGNOSIS — E039 Hypothyroidism, unspecified: Secondary | ICD-10-CM

## 2023-05-03 DIAGNOSIS — K219 Gastro-esophageal reflux disease without esophagitis: Secondary | ICD-10-CM

## 2023-05-03 DIAGNOSIS — R0602 Shortness of breath: Secondary | ICD-10-CM

## 2023-05-03 DIAGNOSIS — IMO0001 Reserved for inherently not codable concepts without codable children: Secondary | ICD-10-CM

## 2023-05-03 DIAGNOSIS — Z0000 Encounter for general adult medical examination without abnormal findings: Secondary | ICD-10-CM

## 2023-05-03 MED ORDER — LEVOTHYROXINE SODIUM 150 MCG PO TABS
150.0000 ug | ORAL_TABLET | Freq: Every day | ORAL | 1 refills | Status: DC
  Filled 2023-05-03 – 2023-06-12 (×2): qty 90, 90d supply, fill #0

## 2023-05-03 MED ORDER — SILDENAFIL CITRATE 100 MG PO TABS
ORAL_TABLET | ORAL | 1 refills | Status: DC
  Filled 2023-05-03: qty 10, 10d supply, fill #0

## 2023-05-03 MED ORDER — OMEPRAZOLE 20 MG PO CPDR
20.0000 mg | DELAYED_RELEASE_CAPSULE | Freq: Every day | ORAL | 1 refills | Status: DC
  Filled 2023-05-03 – 2023-07-31 (×2): qty 90, 90d supply, fill #0

## 2023-05-03 MED ORDER — FLUTICASONE-SALMETEROL 100-50 MCG/ACT IN AEPB
1.0000 | INHALATION_SPRAY | Freq: Two times a day (BID) | RESPIRATORY_TRACT | 2 refills | Status: AC
  Filled 2023-05-03: qty 60, 30d supply, fill #0
  Filled 2024-02-12: qty 60, 30d supply, fill #1

## 2023-05-03 NOTE — Patient Instructions (Signed)

## 2023-05-03 NOTE — Progress Notes (Signed)
Established Patient Office Visit  Subjective   Patient ID: Douglas Bird, male    DOB: [birth year]  Age: 58 y.o. MRN: [id number]  No chief complaint on file.   HPI  JAIMIN KRUPKA   is a 58 year old male who has history of diverticulitis, GERD, hyperlipidemia, hypertension, hypothyroidism, presents for routine follow-up visit and medication refill. He states that he is compliant with his medications, denies side effects and continues to make healthy lifestyle changes. He reports that he recently quit smoking for about a month. Patient reports using his albuterol everyday for about a week due to occasionally wheezing and URI symptoms. Reports checking his blood pressure about twice a week and patient brought in log with readings ranging from 110's-120's and diastolic ranging from 80's-90s. He denies any dizziness, headaches or blurry vision. He reports eating more healthy and exercising more frequently. He also denies any moods swings, diarrhea, constipation, heat or cold intolerance, low energy. Overall, he states his health is great and he is great spirits.   Review of Systems  Constitutional: Negative.   HENT: Negative.    Eyes: Negative.   Respiratory: Negative.    Cardiovascular: Negative.   Gastrointestinal: Negative.   Genitourinary: Negative.   Musculoskeletal: Negative.   Skin: Negative.   Neurological: Negative.   Endo/Heme/Allergies: Negative.   Psychiatric/Behavioral: Negative.        Objective:     BP (!) 143/89 (BP Location: Right Arm)   Pulse 68   Ht 5\' 8"  (1.727 m)   Wt 223 lb 3.2 oz (101.2 kg)   BMI 33.94 kg/m  BP Readings from Last 3 Encounters:  05/03/23 (!) 143/89  02/01/23 (!) 136/90  07/27/22 128/87   Wt Readings from Last 3 Encounters:  05/03/23 223 lb 3.2 oz (101.2 kg)  02/01/23 223 lb (101.2 kg)  07/27/22 226 lb 6.4 oz (102.7 kg)      Physical Exam Constitutional:      Appearance: Normal appearance.  HENT:     Head: Normocephalic.      Right Ear: Tympanic membrane, ear canal and external ear normal.     Left Ear: Tympanic membrane, ear canal and external ear normal.     Nose: Nose normal.     Mouth/Throat:     Mouth: Mucous membranes are moist.     Pharynx: Oropharynx is clear.  Eyes:     Extraocular Movements: Extraocular movements intact.     Conjunctiva/sclera: Conjunctivae normal.     Pupils: Pupils are equal, round, and reactive to light.  Cardiovascular:     Rate and Rhythm: Normal rate and regular rhythm.     Pulses: Normal pulses.     Heart sounds: Normal heart sounds.  Pulmonary:     Effort: Pulmonary effort is normal.     Breath sounds: Normal breath sounds.  Abdominal:     General: Abdomen is flat. Bowel sounds are normal.     Palpations: Abdomen is soft.  Musculoskeletal:        General: Normal range of motion.     Cervical back: Normal range of motion.  Skin:    General: Skin is warm and dry.  Neurological:     General: No focal deficit present.     Mental Status: He is alert. Mental status is at baseline.  Psychiatric:        Mood and Affect: Mood normal.        Behavior: Behavior normal.        Thought Content:  Thought content normal.        Judgment: Judgment normal.      No results found for any visits on 05/03/23.  Last CBC Lab Results  Component Value Date   WBC 8.2 07/19/2022   HGB 15.6 07/19/2022   HCT 45.7 07/19/2022   MCV 92 07/19/2022   MCH 31.3 07/19/2022   RDW 12.8 07/19/2022   PLT 220 07/19/2022   Last metabolic panel Lab Results  Component Value Date   GLUCOSE 105 (H) 07/19/2022   NA 140 07/19/2022   K 4.1 07/19/2022   CL 100 07/19/2022   CO2 26 07/19/2022   BUN 16 07/19/2022   CREATININE 0.97 07/19/2022   EGFR 91 07/19/2022   CALCIUM 9.6 07/19/2022   PROT 6.4 07/19/2022   ALBUMIN 4.3 07/19/2022   LABGLOB 2.1 07/19/2022   AGRATIO 2.0 07/19/2022   BILITOT 0.4 07/19/2022   ALKPHOS 102 07/19/2022   AST 23 07/19/2022   ALT 24 07/19/2022   ANIONGAP 12  11/03/2020   Last lipids Lab Results  Component Value Date   CHOL 158 07/19/2022   HDL 34 (L) 07/19/2022   LDLCALC 85 07/19/2022   TRIG 234 (H) 07/19/2022   CHOLHDL 4.6 07/19/2022   Last hemoglobin A1c Lab Results  Component Value Date   HGBA1C 5.5 07/19/2022   Last thyroid functions Lab Results  Component Value Date   TSH 1.730 07/19/2022   T4TOTAL 9.8 06/04/2019   Last vitamin D No results found for: "25OHVITD2", "25OHVITD3", "VD25OH" Last vitamin B12 and Folate No results found for: "VITAMINB12", "FOLATE"    The 10-year ASCVD risk score (Arnett DK, et al., 2019) is: 20.1%    Assessment & Plan:  1. Hypothyroidism, unspecified type (Primary) - His TSH was euthyroid, he denies any weakness or fatigue. Also denies any mood swings, diarrhea or constipation. Continue taking medications as prescribed.  - levothyroxine (SYNTHROID) 150 MCG tablet; Take 1 tablet (150 mcg total) by mouth daily before breakfast  Dispense: 90 tablet; Refill: 1  2. Gastro-esophageal reflux disease without esophagitis - His acid reflux is under control and will continue taking medications as prescribed. - omeprazole (PRILOSEC) 20 MG capsule; Take 1 capsule (20 mg total) by mouth daily.  Dispense: 90 capsule; Refill: 1  3. History of erectile dysfunction Medication refilled today and will continue taking medication as prescribed.  - sildenafil (VIAGRA) 100 MG tablet; TAKE ONE TABLET BY MOUTH DAILY AS NEEDED FOR ERECTILE DYSFUNCTION  Dispense: 10 tablet; Refill: 1  4. Shortness of breath Reports wheezing, URI symptoms and shortness of breath 2 weeks go and using the albuterol inhaler more frequently. Will start maintenance inhaler to administer twice daily. He was educated on medication, side effects and to notify clinic and go to the ED. -Fluticasone-salmeterol (WIXELA INHUB) 100-50 MCG/ACT AEPB; Inhale 1 puff into the lungs 2 (two) times daily.  Dispense: 60 each; Refill: 2  - He was encouraged to  complete [facility] financial application for referral for Low Dose Lung CT Scan.  Return in about 3 months (around 07/25/2023), or if symptoms worsen or fail to improve.    Chioma Trellis Paganini, NP

## 2023-05-22 ENCOUNTER — Telehealth: Payer: Self-pay

## 2023-05-22 NOTE — Telephone Encounter (Signed)
 Medical record release received from disability. Records printed and sent today.

## 2023-06-12 ENCOUNTER — Other Ambulatory Visit: Payer: Self-pay

## 2023-07-16 ENCOUNTER — Other Ambulatory Visit: Payer: Self-pay

## 2023-07-25 ENCOUNTER — Other Ambulatory Visit: Payer: Self-pay

## 2023-07-26 ENCOUNTER — Encounter: Payer: Self-pay | Admitting: Acute Care

## 2023-07-26 ENCOUNTER — Telehealth: Payer: Self-pay | Admitting: Gerontology

## 2023-07-26 ENCOUNTER — Other Ambulatory Visit: Payer: Self-pay

## 2023-07-26 DIAGNOSIS — Z87891 Personal history of nicotine dependence: Secondary | ICD-10-CM

## 2023-07-26 NOTE — Telephone Encounter (Signed)
 Erroneous encounter for deletion

## 2023-07-26 NOTE — Telephone Encounter (Signed)
 Lung Cancer Screening Narrative/Criteria Questionnaire (Cigarette Smokers Only- No Cigars/Pipes/vapes)   Douglas Bird   SDMV:08/03/23 at 0830am/[facility]                                           03-Feb-1966                       LDCT: 08/07/23 at 0830am / [facility]    58 y.o.   Phone: [phone]  Lung Screening Narrative (confirm age 26-77 yrs Medicare / 50-80 yrs Private pay insurance)   Insurance information:Medicare A/B   Referring Provider:[facility]   This screening involves an initial phone call with a team member from our program. It is called a shared decision making visit. The initial meeting is required by insurance and Medicare to make sure you understand the program. This appointment takes about 15-20 minutes to complete. The CT scan will completed at a separate date/time. This scan takes about 5-10 minutes to complete and you may eat and drink before and after the scan.  Criteria questions for Lung Cancer Screening:   Are you a current or former smoker? Current Age began smoking: 58 yo   If you are a former smoker, what year did you quit smoking? NA   To calculate your smoking history, I need an accurate estimate of how many packs of cigarettes you smoked per day and for how many years. (Not just the number of PPD you are now smoking)   Years smoking 46 x Packs per day 1.5 = Pack years 93   (at least 20 pack yrs)   (Make sure they understand that we need to know how much they have smoked in the past, not just the number of PPD they are smoking now)  Do you have a personal history of cancer?  No    Do you have a family history of cancer? Yes  (cancer type and and relative) father/leukemia -also other relatives on that side of family  Are you coughing up blood?  No  Have you had unexplained weight loss of 15 lbs or more in the last 6 months? No  It looks like you meet all criteria.     Additional information: N/A - had a stroke a year ago and lost a lot of weight but stable now -  trying to regain but not losing

## 2023-07-27 ENCOUNTER — Ambulatory Visit: Admission: RE | Admit: 2023-07-27 | Source: Ambulatory Visit

## 2023-07-27 ENCOUNTER — Other Ambulatory Visit: Payer: Self-pay

## 2023-07-27 ENCOUNTER — Telehealth: Payer: Self-pay | Admitting: Acute Care

## 2023-07-27 DIAGNOSIS — Z87891 Personal history of nicotine dependence: Secondary | ICD-10-CM

## 2023-07-27 DIAGNOSIS — Z122 Encounter for screening for malignant neoplasm of respiratory organs: Secondary | ICD-10-CM

## 2023-07-27 NOTE — Telephone Encounter (Signed)
 Lung Cancer Screening Narrative/Criteria Questionnaire (Cigarette Smokers Only- No Cigars/Pipes/vapes)   Douglas Bird   SDMV:08/08/23 at 0830am / [facility]                                           1966/02/06                        LDCT: 08/09/23 at 0830am/[facility]    58 y.o.   Phone: [phone]  Lung Screening Narrative (confirm age 43-77 yrs Medicare / 50-80 yrs Private pay insurance)   Insurance information:medicaid   [facility]erring Provider[facility] [place]   This screening involves an initial phone call with a team member from our program. It is called a shared decision making visit. The initial meeting is required by insurance and Medicare to make sure you understand the program. This appointment takes about 15-20 minutes to complete. The CT scan will completed at a separate date/time. This scan takes about 5-10 minutes to complete and you may eat and drink before and after the scan.  Criteria questions for Lung Cancer Screening:   Are you a current or former smoker? Former Age began smoking: 24y   If you are a former smoker, what year did you quit smoking? Last quit 04/2023 - quit for 8 years prior   To calculate your smoking history, I need an accurate estimate of how many packs of cigarettes you smoked per day and for how many years. (Not just the number of PPD you are now smoking)   Years smoking 33 x Packs per day 1/2 to 1 = Pack years 25   (at least 20 pack yrs)   (Make sure they understand that we need to know how much they have smoked in the past, not just the number of PPD they are smoking now)  Do you have a personal history of cancer?  No    Do you have a family history of cancer? Yes  (cancer type and and relative) mother/cervical  Are you coughing up blood?  No  Have you had unexplained weight loss of 15 lbs or more in the last 6 months? No  It looks like you meet all criteria.     Additional information: N/A

## 2023-07-31 ENCOUNTER — Other Ambulatory Visit: Payer: Self-pay

## 2023-07-31 ENCOUNTER — Encounter: Payer: Self-pay | Admitting: Gerontology

## 2023-07-31 ENCOUNTER — Ambulatory Visit: Payer: Self-pay | Admitting: Gerontology

## 2023-07-31 VITALS — BP 135/88 | HR 79 | Ht 68.0 in | Wt 227.0 lb

## 2023-07-31 DIAGNOSIS — E782 Mixed hyperlipidemia: Secondary | ICD-10-CM

## 2023-07-31 DIAGNOSIS — I10 Essential (primary) hypertension: Secondary | ICD-10-CM

## 2023-07-31 DIAGNOSIS — K219 Gastro-esophageal reflux disease without esophagitis: Secondary | ICD-10-CM

## 2023-07-31 DIAGNOSIS — E039 Hypothyroidism, unspecified: Secondary | ICD-10-CM

## 2023-07-31 DIAGNOSIS — Z87438 Personal history of other diseases of male genital organs: Secondary | ICD-10-CM

## 2023-07-31 MED ORDER — LEVOTHYROXINE SODIUM 150 MCG PO TABS
150.0000 ug | ORAL_TABLET | Freq: Every day | ORAL | 1 refills | Status: DC
  Filled 2023-07-31 – 2023-09-20 (×2): qty 90, 90d supply, fill #0
  Filled 2023-12-14 (×2): qty 90, 90d supply, fill #1

## 2023-07-31 MED ORDER — LISINOPRIL 20 MG PO TABS
20.0000 mg | ORAL_TABLET | Freq: Every day | ORAL | 1 refills | Status: DC
  Filled 2023-07-31 – 2023-10-08 (×2): qty 90, 90d supply, fill #0

## 2023-07-31 MED ORDER — SILDENAFIL CITRATE 100 MG PO TABS
100.0000 mg | ORAL_TABLET | Freq: Every day | ORAL | 1 refills | Status: AC | PRN
  Filled 2023-07-31: qty 10, fill #0

## 2023-07-31 MED ORDER — OMEPRAZOLE 20 MG PO CPDR
20.0000 mg | DELAYED_RELEASE_CAPSULE | Freq: Every day | ORAL | 1 refills | Status: DC
  Filled 2023-07-31: qty 90, 90d supply, fill #0
  Filled 2023-10-22 – 2023-10-24 (×2): qty 90, 90d supply, fill #1

## 2023-07-31 MED ORDER — HYDROCHLOROTHIAZIDE 12.5 MG PO CAPS
12.5000 mg | ORAL_CAPSULE | Freq: Every day | ORAL | 1 refills | Status: DC
  Filled 2023-07-31 – 2023-10-22 (×2): qty 90, 90d supply, fill #0

## 2023-07-31 MED ORDER — ATORVASTATIN CALCIUM 40 MG PO TABS
40.0000 mg | ORAL_TABLET | Freq: Every day | ORAL | 1 refills | Status: DC
  Filled 2023-07-31 – 2023-09-28 (×2): qty 90, 90d supply, fill #0

## 2023-07-31 NOTE — Progress Notes (Signed)
 Established Patient Office Visit  Subjective   Patient ID: Douglas Bird, male    DOB: 17-Jun-1965  Age: 58 y.o. MRN: [id number]  Chief Complaint  Patient presents with   Follow-up    HPI  Douglas STFLEUR   is a 58 year old male who has history of diverticulitis, GERD, hyperlipidemia, hypertension, hypothyroidism, presents for routine follow-up visit and medication refill. He states that he's compliant with his medications, denies side effects and continues to make healthy lifestyle changes. He states that he has not smoked since February 8th of 2025. He states that his Medicaid is active but his appointment is in October. He denies chest pain, palpitation, shortness of breath and vision changes. Overall, he states that he's doing well and offers no further complaint.  Review of Systems  Constitutional: Negative.   HENT: Negative.    Eyes: Negative.   Respiratory: Negative.    Cardiovascular: Negative.   Skin: Negative.   Neurological: Negative.   Endo/Heme/Allergies: Negative.   Psychiatric/Behavioral: Negative.        Objective:     BP 135/88   Pulse 79   Ht 5\' 8"  (1.727 m)   Wt 227 lb (103 kg)   SpO2 94%   BMI 34.52 kg/m  BP Readings from Last 3 Encounters:  07/31/23 135/88  05/03/23 (!) 143/89  02/01/23 (!) 136/90   Wt Readings from Last 3 Encounters:  07/31/23 227 lb (103 kg)  05/03/23 223 lb 3.2 oz (101.2 kg)  02/01/23 223 lb (101.2 kg)      Physical Exam HENT:     Head: Normocephalic and atraumatic.     Mouth/Throat:     Mouth: Mucous membranes are moist.     Pharynx: Oropharynx is clear.  Eyes:     Extraocular Movements: Extraocular movements intact.     Conjunctiva/sclera: Conjunctivae normal.     Pupils: Pupils are equal, round, and reactive to light.  Cardiovascular:     Rate and Rhythm: Normal rate and regular rhythm.     Pulses: Normal pulses.     Heart sounds: Normal heart sounds.  Pulmonary:     Effort: Pulmonary effort is normal.      Breath sounds: Normal breath sounds.  Skin:    General: Skin is warm.  Neurological:     General: No focal deficit present.     Mental Status: He is alert and oriented to person, place, and time. Mental status is at baseline.  Psychiatric:        Mood and Affect: Mood normal.        Behavior: Behavior normal.        Thought Content: Thought content normal.        Judgment: Judgment normal.      No results found for any visits on 07/31/23.  Last CBC Lab Results  Component Value Date   WBC 8.2 07/19/2022   HGB 15.6 07/19/2022   HCT 45.7 07/19/2022   MCV 92 07/19/2022   MCH 31.3 07/19/2022   RDW 12.8 07/19/2022   PLT 220 07/19/2022   Last metabolic panel Lab Results  Component Value Date   GLUCOSE 105 (H) 07/19/2022   NA 140 07/19/2022   K 4.1 07/19/2022   CL 100 07/19/2022   CO2 26 07/19/2022   BUN 16 07/19/2022   CREATININE 0.97 07/19/2022   EGFR 91 07/19/2022   CALCIUM  9.6 07/19/2022   PROT 6.4 07/19/2022   ALBUMIN 4.3 07/19/2022   LABGLOB 2.1 07/19/2022   AGRATIO  2.0 07/19/2022   BILITOT 0.4 07/19/2022   ALKPHOS 102 07/19/2022   AST 23 07/19/2022   ALT 24 07/19/2022   ANIONGAP 12 11/03/2020   Last lipids Lab Results  Component Value Date   CHOL 158 07/19/2022   HDL 34 (L) 07/19/2022   LDLCALC 85 07/19/2022   TRIG 234 (H) 07/19/2022   CHOLHDL 4.6 07/19/2022   Last hemoglobin A1c Lab Results  Component Value Date   HGBA1C 5.5 07/19/2022   Last thyroid  functions Lab Results  Component Value Date   TSH 1.730 07/19/2022   T4TOTAL 9.8 06/04/2019   Last vitamin D No results found for: "25OHVITD2", "25OHVITD3", "VD25OH" Last vitamin B12 and Folate No results found for: "VITAMINB12", "FOLATE"    The 10-year ASCVD risk score (Arnett DK, et al., 2019) is: 18.3%    Assessment & Plan:   1. Mixed hyperlipidemia -He will continue current medication, low fat cholesterol diet and exercise as tolerated. - atorvastatin  (LIPITOR) 40 MG tablet; Take 1  tablet (40 mg total) by mouth daily.  Dispense: 90 tablet; Refill: 1  2. Benign hypertension (Primary) - His blood pressure is improving, will continue current medication, DASH diet and exercise as tolerated. - hydrochlorothiazide  (MICROZIDE ) 12.5 MG capsule; Take 1 capsule (12.5 mg total) by mouth daily.  Dispense: 90 capsule; Refill: 1 - lisinopril  (ZESTRIL ) 20 MG tablet; Take 1 tablet (20 mg total) by mouth once daily.  Dispense: 90 tablet; Refill: 1  3. Hypothyroidism, unspecified type - His TSH is euthyroid, will continue current medication. - levothyroxine  (SYNTHROID ) 150 MCG tablet; Take 1 tablet (150 mcg total) by mouth daily before breakfast  Dispense: 90 tablet; Refill: 1  4. Gastro-esophageal reflux disease without esophagitis - He will continue current medication. -Avoid spicy, fatty and fried food -Avoid sodas and sour juices -Avoid heavy meals -Avoid eating 4 hours before bedtime -Elevate head of bed at night - omeprazole  (PRILOSEC) 20 MG capsule; Take 1 capsule (20 mg total) by mouth daily.  Dispense: 90 capsule; Refill: 1  5. History of erectile dysfunction - He will continue current medication. - sildenafil  (VIAGRA ) 100 MG tablet; Take 1 tablet (100 mg total) by mouth daily as needed for erectile dysfunction.  Dispense: 10 tablet; Refill: 1  His Medicaid is active, will continue to follow up at [facility] until he follows up with his new PCP in October.  Return in about 3 months (around 11/01/2023), or if symptoms worsen or fail to improve.    Lucylle Foulkes E Colonel Krauser, NP

## 2023-07-31 NOTE — Patient Instructions (Signed)

## 2023-08-08 ENCOUNTER — Ambulatory Visit: Admitting: Adult Health

## 2023-08-08 ENCOUNTER — Encounter: Payer: Self-pay | Admitting: Adult Health

## 2023-08-08 DIAGNOSIS — Z87891 Personal history of nicotine dependence: Secondary | ICD-10-CM

## 2023-08-08 NOTE — Progress Notes (Signed)
  Virtual Visit via Telephone Note  I connected with Douglas Bird , 08/08/23 8:39 AM by a telemedicine application and verified that I am speaking with the correct person using two identifiers.  Location: Patient: home Provider: home   I discussed the limitations of evaluation and management by telemedicine and the availability of in person appointments. The patient expressed understanding and agreed to proceed.   Shared Decision Making Visit Lung Cancer Screening Program [phone])   Eligibility: 58 y.o. Pack Years Smoking History Calculation = 25 pack years  (# packs/per year x # years smoked) Recent History of coughing up blood  no Unexplained weight loss? no ( >Than 15 pounds within the last 6 months ) Prior History Lung / other cancer no (Diagnosis within the last 5 years already requiring surveillance chest CT Scans). Smoking Status Former Smoker Former Smokers: Years since quit: < 1 year  Quit Date: 04/2023  Visit Components: Discussion included one or more decision making aids. YES Discussion included risk/benefits of screening. YES Discussion included potential follow up diagnostic testing for abnormal scans. YES Discussion included meaning and risk of over diagnosis. YES Discussion included meaning and risk of False Positives. YES Discussion included meaning of total radiation exposure. YES  Counseling Included: Importance of adherence to annual lung cancer LDCT screening. YES Impact of comorbidities on ability to participate in the program. YES Ability and willingness to under diagnostic treatment. YES  Smoking Cessation Counseling: Former Smokers:  Discussed the importance of maintaining cigarette abstinence. yes Diagnosis Code: Personal History of Nicotine Dependence. U04.540 Information about tobacco cessation classes and interventions provided to patient. Yes Patient provided with "ticket" for LDCT Scan. yes Written Order for Lung Cancer Screening with LDCT  placed in Epic. Yes (CT Chest Lung Cancer Screening Low Dose W/O CM) [id number]  Z12.2-Screening of respiratory organs Z87.891-Personal history of nicotine dependence   Cullen Dose 08/08/23

## 2023-08-08 NOTE — Patient Instructions (Signed)

## 2023-08-09 ENCOUNTER — Ambulatory Visit
Admission: RE | Admit: 2023-08-09 | Discharge: 2023-08-09 | Disposition: A | Discharge status: Home / Self Care | Source: Ambulatory Visit | Attending: Internal Medicine | Admitting: Internal Medicine

## 2023-08-09 DIAGNOSIS — Z122 Encounter for screening for malignant neoplasm of respiratory organs: Secondary | ICD-10-CM | POA: Diagnosis not present

## 2023-08-09 DIAGNOSIS — Z87891 Personal history of nicotine dependence: Secondary | ICD-10-CM | POA: Insufficient documentation

## 2023-09-03 ENCOUNTER — Other Ambulatory Visit: Payer: Self-pay | Admitting: Acute Care

## 2023-09-03 DIAGNOSIS — Z87891 Personal history of nicotine dependence: Secondary | ICD-10-CM

## 2023-09-03 DIAGNOSIS — Z122 Encounter for screening for malignant neoplasm of respiratory organs: Secondary | ICD-10-CM

## 2023-09-20 ENCOUNTER — Other Ambulatory Visit: Payer: Self-pay

## 2023-09-28 ENCOUNTER — Other Ambulatory Visit: Payer: Self-pay

## 2023-10-08 ENCOUNTER — Other Ambulatory Visit: Payer: Self-pay

## 2023-10-22 ENCOUNTER — Other Ambulatory Visit: Payer: Self-pay

## 2023-10-24 ENCOUNTER — Other Ambulatory Visit: Payer: Self-pay

## 2023-10-30 ENCOUNTER — Encounter: Payer: Self-pay | Admitting: Gerontology

## 2023-10-30 ENCOUNTER — Ambulatory Visit: Admitting: Gerontology

## 2023-10-30 VITALS — BP 117/81 | HR 84 | Ht 68.0 in | Wt 229.6 lb

## 2023-10-30 DIAGNOSIS — K449 Diaphragmatic hernia without obstruction or gangrene: Secondary | ICD-10-CM | POA: Insufficient documentation

## 2023-10-30 DIAGNOSIS — I10 Essential (primary) hypertension: Secondary | ICD-10-CM

## 2023-10-30 NOTE — Patient Instructions (Signed)

## 2023-10-30 NOTE — Progress Notes (Signed)
 Established Patient Office Visit  Subjective   Patient ID: Douglas Bird, male    DOB: 03/05/1966  Age: 58 y.o. MRN: [id number]  Chief Complaint  Patient presents with   Follow-up    HPI   Douglas Bird   is a 58 year old male who has history of diverticulitis, GERD, hyperlipidemia, hypertension, hypothyroidism, presents for routine follow-up visit and medication refill .  He states that he is compliant with his medications, denies side effects and continues to make healthy lifestyle changes.  He has low-dose CT scan of the chest done on 08/09/2023, and showed1. Lung-RADS 1, negative. Continue annual screening with low-dose chest CT without contrast in 12 months. 2. Age advanced 2 vessel coronary artery calcification. 3. Moderate hiatal hernia.4.  Emphysema (ICD10-J43.9). He states that he has a new patient appointment with his new PCP since his Medicaid is active on December 24, 2023.  Overall, he states that he is doing well and offers no further complaints.  Review of Systems  Constitutional: Negative.   Eyes: Negative.   Respiratory: Negative.    Cardiovascular: Negative.   Gastrointestinal: Negative.   Skin: Negative.   Neurological: Negative.   Psychiatric/Behavioral: Negative.        Objective:     BP 117/81 (Patient Position: Sitting)   Pulse 84   Ht 5' 8 (1.727 m)   Wt 229 lb 9.6 oz (104.1 kg)   SpO2 96%   BMI 34.91 kg/m  BP Readings from Last 3 Encounters:  10/30/23 117/81  07/31/23 135/88  05/03/23 (!) 143/89   Wt Readings from Last 3 Encounters:  10/30/23 229 lb 9.6 oz (104.1 kg)  08/09/23 226 lb (102.5 kg)  07/31/23 227 lb (103 kg)      Physical Exam HENT:     Head: Normocephalic and atraumatic.     Mouth/Throat:     Mouth: Mucous membranes are moist.     Pharynx: Oropharynx is clear.  Eyes:     Extraocular Movements: Extraocular movements intact.     Conjunctiva/sclera: Conjunctivae normal.     Pupils: Pupils are equal, round, and reactive  to light.  Cardiovascular:     Rate and Rhythm: Normal rate and regular rhythm.     Pulses: Normal pulses.     Heart sounds: Normal heart sounds.  Pulmonary:     Effort: Pulmonary effort is normal.     Breath sounds: Normal breath sounds.  Abdominal:     General: Bowel sounds are normal. There is no distension.     Palpations: Abdomen is soft.     Tenderness: There is no abdominal tenderness. There is no guarding.  Skin:    General: Skin is warm.  Neurological:     General: No focal deficit present.     Mental Status: He is alert and oriented to person, place, and time.  Psychiatric:        Mood and Affect: Mood normal.        Behavior: Behavior normal.      No results found for any visits on 10/30/23.  Last CBC Lab Results  Component Value Date   WBC 8.2 07/19/2022   HGB 15.6 07/19/2022   HCT 45.7 07/19/2022   MCV 92 07/19/2022   MCH 31.3 07/19/2022   RDW 12.8 07/19/2022   PLT 220 07/19/2022   Last metabolic panel Lab Results  Component Value Date   GLUCOSE 105 (H) 07/19/2022   NA 140 07/19/2022   K 4.1 07/19/2022   CL  100 07/19/2022   CO2 26 07/19/2022   BUN 16 07/19/2022   CREATININE 0.97 07/19/2022   EGFR 91 07/19/2022   CALCIUM  9.6 07/19/2022   PROT 6.4 07/19/2022   ALBUMIN 4.3 07/19/2022   LABGLOB 2.1 07/19/2022   AGRATIO 2.0 07/19/2022   BILITOT 0.4 07/19/2022   ALKPHOS 102 07/19/2022   AST 23 07/19/2022   ALT 24 07/19/2022   ANIONGAP 12 11/03/2020   Last lipids Lab Results  Component Value Date   CHOL 158 07/19/2022   HDL 34 (L) 07/19/2022   LDLCALC 85 07/19/2022   TRIG 234 (H) 07/19/2022   CHOLHDL 4.6 07/19/2022   Last hemoglobin A1c Lab Results  Component Value Date   HGBA1C 5.5 07/19/2022   Last thyroid  functions Lab Results  Component Value Date   TSH 1.730 07/19/2022   T4TOTAL 9.8 06/04/2019   Last vitamin D No results found for: 25OHVITD2, 25OHVITD3, VD25OH Last vitamin B12 and Folate No results found for:  VITAMINB12, FOLATE    The 10-year ASCVD risk score (Arnett DK, et al., 2019) is: 15.7%    Assessment & Plan:   1. Hiatal hernia -He denies abdominal pain, agrees to follow-up with general surgery for evaluation of hernia with new primary care provider.  He was advised to go to the emergency room for worsening symptoms. - Ambulatory referral to General Surgery  2. Benign hypertension (Primary) -Blood pressure improving, will continue current medication DASH diet and exercise as tolerated.   Return if symptoms worsen or fail to improve.  He has no follow-up visit due to having active Medicaid.  [facility] wishes him well with his care.   Danai Gotto E Aloysuis Ribaudo, NP

## 2023-12-14 ENCOUNTER — Other Ambulatory Visit: Payer: Self-pay

## 2023-12-24 ENCOUNTER — Ambulatory Visit: Admitting: Family Medicine

## 2023-12-24 ENCOUNTER — Other Ambulatory Visit: Payer: Self-pay

## 2023-12-24 ENCOUNTER — Encounter: Payer: Self-pay | Admitting: Family Medicine

## 2023-12-24 ENCOUNTER — Other Ambulatory Visit: Payer: Self-pay | Admitting: Family Medicine

## 2023-12-24 VITALS — BP 122/78 | HR 67 | Ht 68.0 in | Wt 228.5 lb

## 2023-12-24 DIAGNOSIS — Z8719 Personal history of other diseases of the digestive system: Secondary | ICD-10-CM

## 2023-12-24 DIAGNOSIS — K219 Gastro-esophageal reflux disease without esophagitis: Secondary | ICD-10-CM

## 2023-12-24 DIAGNOSIS — Z9889 Other specified postprocedural states: Secondary | ICD-10-CM

## 2023-12-24 DIAGNOSIS — K429 Umbilical hernia without obstruction or gangrene: Secondary | ICD-10-CM

## 2023-12-24 DIAGNOSIS — I10 Essential (primary) hypertension: Secondary | ICD-10-CM

## 2023-12-24 DIAGNOSIS — E039 Hypothyroidism, unspecified: Secondary | ICD-10-CM

## 2023-12-24 DIAGNOSIS — R7303 Prediabetes: Secondary | ICD-10-CM

## 2023-12-24 DIAGNOSIS — E782 Mixed hyperlipidemia: Secondary | ICD-10-CM

## 2023-12-24 DIAGNOSIS — Z125 Encounter for screening for malignant neoplasm of prostate: Secondary | ICD-10-CM

## 2023-12-24 DIAGNOSIS — Z0000 Encounter for general adult medical examination without abnormal findings: Secondary | ICD-10-CM

## 2023-12-24 MED ORDER — LEVOTHYROXINE SODIUM 150 MCG PO TABS
150.0000 ug | ORAL_TABLET | Freq: Every day | ORAL | 1 refills | Status: AC
  Filled 2023-12-24: qty 90, 90d supply, fill #0
  Filled 2024-03-25: qty 30, 30d supply, fill #0
  Filled 2024-04-20: qty 30, 30d supply, fill #1

## 2023-12-24 MED ORDER — OMEPRAZOLE 20 MG PO CPDR
20.0000 mg | DELAYED_RELEASE_CAPSULE | Freq: Every day | ORAL | 1 refills | Status: AC
  Filled 2023-12-24 – 2024-01-18 (×2): qty 90, 90d supply, fill #0
  Filled 2024-04-20: qty 90, 90d supply, fill #1

## 2023-12-24 MED ORDER — LISINOPRIL 20 MG PO TABS
20.0000 mg | ORAL_TABLET | Freq: Every day | ORAL | 1 refills | Status: AC
  Filled 2023-12-24: qty 90, 90d supply, fill #0
  Filled 2024-04-07: qty 30, 30d supply, fill #1

## 2023-12-24 MED ORDER — HYDROCHLOROTHIAZIDE 12.5 MG PO CAPS
12.5000 mg | ORAL_CAPSULE | Freq: Every day | ORAL | 1 refills | Status: AC
  Filled 2023-12-24 – 2024-01-18 (×2): qty 90, 90d supply, fill #0
  Filled 2024-04-20: qty 90, 90d supply, fill #1

## 2023-12-24 MED ORDER — ATORVASTATIN CALCIUM 40 MG PO TABS
40.0000 mg | ORAL_TABLET | Freq: Every day | ORAL | 1 refills | Status: AC
  Filled 2023-12-24: qty 90, 90d supply, fill #0
  Filled 2024-04-07: qty 60, 60d supply, fill #1

## 2023-12-24 NOTE — Progress Notes (Signed)
 Subjective:    Patient ID: Douglas Bird, male    DOB: Aug 18, 1965, 58 y.o.   MRN: [id number]  Douglas Bird is a 58 y.o. male presenting on 12/24/2023 for No chief complaint on file.   HPI  Discussed the use of AI scribe software for clinical note transcription with the patient, who gave verbal consent to proceed.  History of Present Illness   Douglas Bird is a 58 year old male who presents with worsening abdominal pain and hernia symptoms.  Previously managed at [facility], now has acquired Douglas Bird and setting up Medicare, he is on Medical Disability now  Abdominal pain and hernia symptoms - Worsening abdominal pain and discomfort in the area of the umbilicus, particularly when lifting or getting up from a reclined position - History of umbilical hernia, surgically repaired approximately 2021 through [facility] Marolyn [facility]. 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 and multiple surgeries Right hand dominant, saw injury at work, Dr Edie Reduced sensory strength, nerve damage, cannot grasp or lift >10-15 lbs Has had extensive work up and treatment, prior surgeries x 6 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   - Experiences burning sensations in the hand, described as 'like somebody took a lighter and it just started burning my top of my hand' - Manages  pain with ibuprofen  as needed - Previously trialed gabapentin  and Lyrica, discontinued due to side effects  Former smoker / Tobacco use - History of smoking, quit on February 8th of this year after experiencing difficulty breathing and a significant illness - Smoking previously interfered with daily activities - No tobacco use since quitting     Tried pain meds, Gabapentin , Lyrica etc    History Diverticulosis      12/24/2023    9:22 AM 02/01/2023   10:45 AM 07/27/2022    2:37 PM  Depression screen PHQ 2/9  Decreased Interest 1 0 0  Down, Depressed, Hopeless 0 0 0  PHQ - 2 Score 1 0 0       12/24/2023    9:22 AM  GAD 7 : Generalized Anxiety Score  Nervous, Anxious, on Edge 0  Control/stop worrying 0  Worry too much - different things 0  Trouble relaxing 0  Restless 0  Easily annoyed or irritable 0  Afraid - awful might happen 0  Total GAD 7 Score 0  Anxiety Difficulty Not difficult at all     Past Medical History:  Diagnosis Date   Diverticulitis 2011   GERD (gastroesophageal reflux disease)    Hyperlipidemia    Hypertension    controlled on meds   Hypothyroidism    Thyroid  disease    Past Surgical History:  Procedure Laterality Date   CHOLECYSTECTOMY  June 2013   COLONOSCOPY WITH PROPOFOL  N/A 04/28/2019   Procedure: COLONOSCOPY WITH PROPOFOL ;  Surgeon: Unk Corinn Skiff, MD;  Location: [facility];  Service: Endoscopy;  Laterality: N/A;  priority 4   HERNIA REPAIR Bilateral    inguinal   REPAIR EXTENSOR TENDON Right 04/28/2020   Procedure: EXPLORATION OF RIGHT DORSAL HAND LACERATION WITH REPAIR OF INDEX EXTENSOR TENDON LACERATION;  Surgeon: Edie Norleen PARAS, MD;  Location: [facility];  Service: Orthopedics;  Laterality: Right;   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   Procedure: 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.;  Surgeon: Edie Norleen PARAS, MD;  Location: [facility];  Service: Orthopedics;  Laterality: Right;   RESECTION OF  HAND NEUROMA Right 03/23/2021   Procedure: 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 with implantation of the nerve and into the adjacent interosseous musculature.;  Surgeon: Edie Norleen PARAS, MD;  Location: [facility];  Service: Orthopedics;  Laterality: Right;   UMBILICAL HERNIA REPAIR N/A 08/11/2019   Procedure: HERNIA REPAIR UMBILICAL ADULT;  Surgeon: Marolyn Nest, MD;  Location: [facility];  Service: General;  Laterality: N/A;  RNFA   Social History   Socioeconomic History   Marital status: Married    Spouse name: Not on file   Number of children: Not on file   Years of education: Not on file   Highest education level: Not on file  Occupational History   Not on file  Tobacco Use   Smoking status: Former    Current packs/day: 0.00    Average packs/day: 0.2 packs/day for 18.2 years (4.5 ttl pk-yrs)    Types: Cigarettes    Start date: 02/16/2005    Quit date: 04/28/2023    Years since quitting: 0.6   Smokeless tobacco: Never  Vaping Use   Vaping status: Never Used  Substance and Sexual Activity   Alcohol use: Not Currently    Alcohol/week: 6.0 standard drinks of alcohol    Types: 6 Cans of beer per week    Comment: 6 a week if that on the weekends   Drug use: No   Sexual activity: Yes  Other Topics Concern   Not on file  Social History Narrative   Douglas Bird )Girlfriend   Social Drivers of Health   Financial Resource Strain: Medium Risk (02/01/2023)   Overall Financial Resource Strain (CARDIA)    Difficulty of Paying Living Expenses: Somewhat hard  Food Insecurity: No Food Insecurity (02/01/2023)   Hunger Vital Sign    Worried About Running Out of Food in the Last Year: Never true    Ran Out of Food in the Last Year: Never true  Transportation Needs: No Transportation Needs (02/01/2023)   PRAPARE - Administrator, Civil Service (Medical): No    Lack of Transportation (Non-Medical): No  Physical Activity: Inactive (02/01/2023)    Exercise Vital Sign    Days of Exercise per Week: 0 days    Minutes of Exercise per Session: 0 min  Stress: No Stress Concern Present (02/01/2023)   [employer] of Occupational Health - Occupational Stress Questionnaire    Feeling of Stress : Not at all  Social Connections: Moderately Integrated (02/01/2023)   Social Connection and Isolation Panel    Frequency of Communication with Friends and Family: Three times a week    Frequency of Social Gatherings with Friends and Family: Three times a week    Attends Religious Services: More than 4 times per year    Active Member of Clubs or Organizations: No    Attends Banker Meetings: Never    Marital Status: Married  Catering manager Violence: Not At  Risk (02/01/2023)   Humiliation, Afraid, Rape, and Kick questionnaire    Fear of Current or Ex-Partner: No    Emotionally Abused: No    Physically Abused: No    Sexually Abused: No   Family History  Problem Relation Age of Onset   Cancer Mother    Stroke Father    Gout Father    Aneurysm Father        2006   Thyroid  disease Father    Epilepsy Father    Thyroid  disease Sister    Diabetes Paternal Grandfather    Current Outpatient Medications on File Prior to Visit  Medication Sig   albuterol  (VENTOLIN  HFA) 108 (90 Base) MCG/ACT inhaler Inhale 1-2 puffs into the lungs every 6 (six) hours as needed for wheezing or shortness of breath.   fluticasone -salmeterol (WIXELA INHUB ) 100-50 MCG/ACT AEPB Inhale 1 puff into the lungs 2 (two) times daily.   ibuprofen  (ADVIL ) 800 MG tablet Take 1 tablet (800 mg total) by mouth every 8 (eight) hours as needed for moderate pain.   sildenafil  (VIAGRA ) 100 MG tablet Take 1 tablet (100 mg total) by mouth daily as needed for erectile dysfunction.   No current facility-administered medications on file prior to visit.    Review of Systems Per HPI unless specifically indicated above     Objective:    BP 122/78 (BP Location: Right Arm,  Patient Position: Sitting, Cuff Size: Normal)   Pulse 67   Ht 5' 8 (1.727 m)   Wt 228 lb 8 oz (103.6 kg)   SpO2 95%   BMI 34.74 kg/m   Wt Readings from Last 3 Encounters:  12/24/23 228 lb 8 oz (103.6 kg)  10/30/23 229 lb 9.6 oz (104.1 kg)  08/09/23 226 lb (102.5 kg)    Physical Exam Vitals and nursing note reviewed.  Constitutional:      General: He is not in acute distress.    Appearance: Normal appearance. He is well-developed. He is not diaphoretic.     Comments: Well-appearing, comfortable, cooperative  HENT:     Head: Normocephalic and atraumatic.  Eyes:     General:        Right eye: No discharge.        Left eye: No discharge.     Conjunctiva/sclera: Conjunctivae normal.  Neck:     Thyroid : No thyromegaly.  Cardiovascular:     Rate and Rhythm: Normal rate and regular rhythm.     Pulses: Normal pulses.     Heart sounds: Normal heart sounds. No murmur heard. Pulmonary:     Effort: Pulmonary effort is normal. No respiratory distress.     Breath sounds: Normal breath sounds. No wheezing or rales.  Abdominal:     Hernia: A hernia (repaired umbilical hernia, with localized fullness bulging R lower central abdomen near umbilicus. No inguinal hernia) is present.  Musculoskeletal:        General: Normal range of motion.     Cervical back: Normal range of motion and neck supple.  Lymphadenopathy:     Cervical: No cervical adenopathy.  Skin:    General: Skin is warm and dry.     Findings: No erythema or rash.  Neurological:     Mental Status: He is alert and oriented to person, place, and time. Mental status is at baseline.  Psychiatric:        Mood and Affect: Mood normal.        Behavior: Behavior normal.  Thought Content: Thought content normal.     Comments: Well groomed, good eye contact, normal speech and thoughts     I have personally reviewed the radiology report from 08/09/23 on LDCT.    CLINICAL DATA:  Former 25 pack-year smoker, quit February  2025.   EXAM: CT CHEST WITHOUT CONTRAST LOW-DOSE FOR LUNG CANCER SCREENING   TECHNIQUE: Multidetector CT imaging of the chest was performed following the standard protocol without IV contrast.   RADIATION DOSE REDUCTION: This exam was performed according to the departmental dose-optimization program which includes automated exposure control, adjustment of the mA and/or kV according to patient size and/or use of iterative reconstruction technique.   COMPARISON:  05/21/2009.   FINDINGS: Cardiovascular: Age advanced left anterior descending and right coronary artery calcification. Heart is at the upper limits of normal in size. No pericardial effusion.   Mediastinum/Nodes: No pathologically enlarged mediastinal or axillary lymph nodes. Hilar regions are difficult to definitively evaluate without IV contrast. Esophagus is grossly unremarkable.   Lungs/Pleura: Centrilobular emphysema. Smoking related respiratory bronchiolitis. Scattered pulmonary parenchymal scarring. Image quality is somewhat degraded by respiratory motion. Benign thin walled cyst in the lingula. No suspicious pulmonary nodules. No pleural fluid. Airway is unremarkable.   Upper Abdomen: Cholecystectomy. Moderate hiatal hernia. Duodenal diverticulum, incidentally noted. Visualized portions of the liver, adrenal glands, kidneys, spleen, pancreas, stomach and bowel are otherwise grossly unremarkable. No upper abdominal adenopathy.   Musculoskeletal: Degenerative changes in the spine.   IMPRESSION: 1. Lung-RADS 1, negative. Continue annual screening with low-dose chest CT without contrast in 12 months. 2. Age advanced 2 vessel coronary artery calcification. 3. Moderate hiatal hernia. 4.  Emphysema (ICD10-J43.9).     Electronically Signed   By: Newell Eke M.D.   On: 09/02/2023 15:09  Results for orders placed or performed in visit on 02/01/23  Urine Microalbumin w/creat. ratio   Collection Time:  02/01/23 12:00 AM  Result Value Ref Range   Creatinine, Urine 104.7 Not Estab. mg/dL   Microalbumin, Urine 3.1 Not Estab. ug/mL   Microalb/Creat Ratio 3 0 - 29 mg/g creat      Assessment & Plan:   Problem List Items Addressed This Visit     Hypothyroidism (Chronic)   Relevant Medications   levothyroxine  (SYNTHROID ) 150 MCG tablet   Benign hypertension   Relevant Medications   lisinopril  (ZESTRIL ) 20 MG tablet   hydrochlorothiazide  (MICROZIDE ) 12.5 MG capsule   atorvastatin  (LIPITOR) 40 MG tablet   Mixed hyperlipidemia   Relevant Medications   lisinopril  (ZESTRIL ) 20 MG tablet   hydrochlorothiazide  (MICROZIDE ) 12.5 MG capsule   atorvastatin  (LIPITOR) 40 MG tablet   Other Visit Diagnoses       Umbilical hernia without obstruction and without gangrene    -  Primary   Relevant Orders   Ambulatory referral to General Surgery     History of umbilical hernia repair       Relevant Orders   Ambulatory referral to General Surgery     Gastro-esophageal reflux disease without esophagitis       Relevant Medications   omeprazole  (PRILOSEC) 20 MG capsule       Establish care Updated Health Maintenance information Encouraged improvement to lifestyle with diet and exercise Goal of weight loss  Umbilical vs Paraumbilcal hernia R sided Parumbilical hernia with localized pain, exacerbated by lifting and certain movements. Previous umbilical hernia repair noted 2021 by [facility] Dr Marolyn.  - Refer to general surgery for evaluation of parumbilical hernia if any recurrence. Consider  future discussion with hiatal hernia evaluation if indicated as well but that is separate issue - Provide contact information for surgical practice. - Advise to avoid lifting heavy objects and monitor symptoms.  Hiatal hernia Long-standing hiatal hernia managed with omeprazole  20 mg daily. Symptoms include heartburn and acid reflux, controlled with medication and dietary modifications. - Continue  omeprazole  20 mg daily. - Advise to continue dietary modifications to avoid spicy foods and seeds. - May discuss with gen surgery in future  Chronic pain and neuropathy of right hand Traumatic injury On Disability following a work-related injury in 2022, 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. Symptoms include burning sensation and neuropathic pain. Previous treatments with gabapentin  and Lyrica were not tolerated. He had extensive work up, treatments, and surgery on R hand x 6 in past. Dr Edie has primarily managed this. He is no on full physical disability and remains out of work long term - Recommend trial of alpha lipoic acid (Nervive) for nerve health. - Continue ibuprofen  as needed for pain management.  Pre-Diabetes Previously A1c 5-6 range He is not on medication currently  Hypertension Hypertension managed with lisinopril  and hydrochlorothiazide . Current regimen appears effective. - Continue lisinopril  and hydrochlorothiazide  as prescribed.  Hyperlipidemia Hyperlipidemia managed with atorvastatin . Current regimen appears effective. - Continue atorvastatin  as prescribed. - Send prescription to preferred pharmacy for future refills.  Hypothyroidism Hypothyroidism managed with thyroid  medication. Current regimen appears effective. - Continue thyroid  medication as prescribed.          Orders Placed This Encounter  Procedures   Ambulatory referral to General Surgery    Referral Priority:   Routine    Referral Type:   Surgical    Referral Reason:   Specialty Services Required    Requested Specialty:   General Surgery    Number of Visits Requested:   1    Meds ordered this encounter  Medications   omeprazole  (PRILOSEC) 20 MG capsule    Sig: Take 1 capsule (20 mg total) by mouth daily. Before lunch    Dispense:  90 capsule    Refill:  1    Add refills for future   lisinopril  (ZESTRIL ) 20 MG tablet    Sig: Take 1 tablet (20 mg total) by mouth daily.    Dispense:  90  tablet    Refill:  1    Add refills for future   levothyroxine  (SYNTHROID ) 150 MCG tablet    Sig: Take 1 tablet (150 mcg total) by mouth daily before breakfast.    Dispense:  90 tablet    Refill:  1    Add refills for future   hydrochlorothiazide  (MICROZIDE ) 12.5 MG capsule    Sig: Take 1 capsule (12.5 mg total) by mouth daily.    Dispense:  90 capsule    Refill:  1    Add refills for future   atorvastatin  (LIPITOR) 40 MG tablet    Sig: Take 1 tablet (40 mg total) by mouth at bedtime.    Dispense:  90 tablet    Refill:  1    Add refills for future     Follow up plan: Return in about 6 weeks (around 02/04/2024) for 4-6 weeks fasting lab only then 1 week later Annual Physical.  Future labs ordered 01/28/24  Marsa Officer, DO [facility] 12/24/2023, 8:35 AM

## 2023-12-24 NOTE — Patient Instructions (Addendum)
 Thank you for coming to the office today.  Ordered referral, they should contact you within 1-2 weeks to schedule.  Discuss Umbilical Hernia follow-up or other abdominal hernia questions.  GENERAL SURGERY  [facility] [address] [place],  [place]  [address] Phone: [phone]  For nerve supplement vitamin, Nervive or Alpha Lipoic Acid 600mg  3 times a day approximately  --------------------------------------  [facility]   Address: [address] [place], [place] [address] Phone: [phone]  Website: [email or web address]   [facility] [address] [place], [place] [address] Phone: [phone] [email or web address]  [facility]  Address: [address] [place], [place], [place] [address] Phone: [phone]   [facility] [address] [place], [place] [address] Phone: [phone]  [facility] Address: [address] [place], [place], [place] [address]  Phone: [phone]    Please schedule a Follow-up Appointment to: Return in about 6 weeks (around 02/04/2024) for 4-6 weeks fasting lab only then 1 week later Annual Physical.  If you have any other questions or concerns, please feel free to call the office or send a message through MyChart. You may also schedule an earlier appointment if necessary.  Additionally, you may be receiving a survey about your experience at our office within a few days to 1 week by e-mail or mail. We value your feedback.  Marsa Officer, DO [facility], [place]

## 2024-01-01 ENCOUNTER — Encounter: Payer: Self-pay | Admitting: General Surgery

## 2024-01-01 ENCOUNTER — Ambulatory Visit (INDEPENDENT_AMBULATORY_CARE_PROVIDER_SITE_OTHER): Payer: Self-pay | Admitting: General Surgery

## 2024-01-01 VITALS — BP 137/89 | HR 72 | Temp 98.0°F | Ht 68.0 in | Wt 230.4 lb

## 2024-01-01 DIAGNOSIS — K449 Diaphragmatic hernia without obstruction or gangrene: Secondary | ICD-10-CM

## 2024-01-01 DIAGNOSIS — R1031 Right lower quadrant pain: Secondary | ICD-10-CM

## 2024-01-01 NOTE — Patient Instructions (Signed)
 Umbilical Hernia, Adult  A hernia is a lump of tissue that pushes through an opening in the muscles. An umbilical hernia happens in the belly, near the belly button. The hernia may contain tissues from the small or large intestine. It may also have fatty tissue that covers the intestines. Umbilical hernias in adults may get worse over time. They need to be treated with surgery. There are several types of umbilical hernias. They include: Indirect hernia. This occurs just above or below the belly button. It's the most common type of umbilical hernia in adults. Direct hernia. This type occurs in an opening that's formed by the belly button. Reducible hernia. This hernia comes and goes. You may see it only when you strain, cough, or lift something heavy. This type of hernia can be pushed back into the belly (reduced). Incarcerated hernia. This traps the hernia in the wall of the belly. This type of hernia can't be pushed back into the belly. It can cause a strangulated hernia. Strangulated hernia. This hernia cuts off blood flow to the tissues inside the hernia. The tissues can die if this happens. This type of hernia must be treated right away. What are the causes? An umbilical hernia happens when tissue inside the belly pushes through an opening in the muscles of the belly. What increases the risk? You're more likely to get this hernia if: You strain while lifting or pushing heavy objects. You've had several pregnancies. You have a condition that puts pressure on your belly, and you've had it for a long time. These include: Obesity. A buildup of fluid inside your belly. Vomiting or coughing all the time. Trouble pooping (constipation). You've had surgery that weakened the muscles in the belly. What are the signs or symptoms? The main symptom of this condition is a bulge at the belly button or near it. The bulge does not cause pain. Other symptoms depend on the type of hernia you have. A  reducible hernia may be seen only when you strain, cough, or lift something heavy. Other symptoms may include: Dull pain. A feeling of pressure. An incarcerated hernia may cause very bad pain. Also, you may: Vomit or feel like you may vomit. Not be able to pass gas. A strangulated hernia may cause: Pain that gets worse and worse. Vomiting, or feeling like you may vomit. Pain when you press on the hernia. Change of color on the skin over the hernia. The skin may become red or purple. Trouble pooping. Blood in the poop. How is this diagnosed? This condition may be diagnosed based on: Your symptoms and medical history. A physical exam. You may be asked to cough or strain while standing. These actions will put pressure inside your belly. The pressure can force the hernia through the opening in your muscles. Your health care provider may try to push the hernia back into your belly (reduce). How is this treated? Surgery is the only treatment for an umbilical hernia. Surgery for a strangulated hernia must be done right away. If you have a small hernia that's not incarcerated, you may need to lose weight before the surgery is done. Follow these instructions at home: Managing constipation You may need to take these actions to prevent trouble pooping. This will help to prevent straining. Drink enough fluid to keep your pee (urine) pale yellow. Take over-the-counter or prescription medicines. Eat foods that are high in fiber, such as beans, whole grains, and fresh fruits and vegetables. Limit foods that are high in  fat and sugars, such as fried or sweet foods. General instructions Do not try to push the hernia back in. Lose weight, if told by your provider. Watch your hernia for any changes in color or size. Tell your provider if any changes occur. You may need to avoid activities that put pressure on your hernia. You may have to avoid lifting. Ask your provider how much you can safely  lift. Take over-the-counter and prescription medicines only as told by your provider. Contact a health care provider if: Your hernia gets larger or feels hard. Your hernia becomes painful. You get a fever or chills. Get help right away if: You get very bad pain near the area of the hernia, and the pain comes on suddenly. You have pain and you vomit or feel like you may vomit. The skin over your hernia changes color. These symptoms may be an emergency. Get help right away. Call 911. Do not wait to see if the symptoms go away. Do not drive yourself to the hospital. This information is not intended to replace advice given to you by your health care provider. Make sure you discuss any questions you have with your health care provider. Document Revised: 06/27/2022 Document Reviewed: 06/27/2022 Elsevier Patient Education  2024 [employer].

## 2024-01-03 NOTE — Progress Notes (Signed)
 Patient ID: Douglas Bird, male   DOB: Feb 20, 1966, 58 y.o.   MRN: [id number] CC: Hernia History of Present Illness Douglas Bird is a 58 y.o. male with with past medical history as below who presents in consultation for hernia.  The patient reports that he had a hernia repair several years ago.  He says that recently he started to have abdominal pain.  He describes that the pain is sharp and in the right lower quadrant without any rebound tenderness or guarding.  He denies any nausea or vomiting.  He denies any fevers or chills.  As far as umbilicus he says that there is a little bulge at the umbilicus but it does not cause him any pain. .  Past Medical History Past Medical History:  Diagnosis Date   Diverticulitis 2011   GERD (gastroesophageal reflux disease)    Hyperlipidemia    Hypertension    controlled on meds   Hypothyroidism    Thyroid  disease        Past Surgical History:  Procedure Laterality Date   CHOLECYSTECTOMY  June 2013   COLONOSCOPY WITH PROPOFOL  N/A 04/28/2019   Procedure: COLONOSCOPY WITH PROPOFOL ;  Surgeon: Unk Corinn Skiff, MD;  Location: [facility];  Service: Endoscopy;  Laterality: N/A;  priority 4   HERNIA REPAIR Bilateral    inguinal   REPAIR EXTENSOR TENDON Right 04/28/2020   Procedure: EXPLORATION OF RIGHT DORSAL HAND LACERATION WITH REPAIR OF INDEX EXTENSOR TENDON LACERATION;  Surgeon: Edie Norleen PARAS, MD;  Location: [facility];  Service: Orthopedics;  Laterality: Right;   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   Procedure: 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.;  Surgeon: Edie Norleen PARAS, MD;  Location: [facility];  Service: Orthopedics;  Laterality: Right;   RESECTION OF HAND NEUROMA Right 03/23/2021   Procedure: 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 6.7 g 2   atorvastatin  (LIPITOR) 40 MG tablet Take 1 tablet (40 mg total) by mouth at bedtime. 90 tablet 1   fluticasone -salmeterol (WIXELA INHUB ) 100-50 MCG/ACT AEPB Inhale 1 puff into the lungs 2 (two) times daily. 60 each 2   hydrochlorothiazide  (MICROZIDE ) 12.5 MG capsule Take 1 capsule (12.5 mg total) by mouth daily. 90 capsule 1   ibuprofen  (ADVIL ) 800 MG tablet Take 1 tablet (800 mg total) by mouth every 8 (eight) hours as needed for moderate pain. 60 tablet 1   levothyroxine  (SYNTHROID ) 150 MCG tablet Take 1 tablet (150 mcg total) by mouth daily before breakfast. 90 tablet 1   lisinopril  (ZESTRIL ) 20 MG tablet Take 1 tablet (20 mg total) by mouth daily. 90 tablet 1   omeprazole  (PRILOSEC) 20 MG capsule Take 1 capsule (20 mg total) by mouth daily. Before lunch 90 capsule 1   sildenafil  (VIAGRA ) 100 MG tablet Take 1 tablet (100 mg total) by mouth daily as needed for  erectile dysfunction. 10 tablet 1   No current facility-administered medications for this visit.    Family History Family History  Problem Relation Age of Onset   Cancer Mother    Stroke Father    Gout Father    Aneurysm Father        2006   Thyroid  disease Father    Epilepsy Father    Thyroid  disease Sister    Diabetes Paternal Grandfather        Social History Social History   Tobacco Use   Smoking status: Former    Current packs/day: 0.00    Average packs/day: 0.2 packs/day for 18.2 years (4.5 ttl pk-yrs)    Types: Cigarettes    Start date: 02/16/2005    Quit date:  04/28/2023    Years since quitting: 0.6   Smokeless tobacco: Never  Vaping Use   Vaping status: Never Used  Substance Use Topics   Alcohol use: Not Currently    Alcohol/week: 6.0 standard drinks of alcohol    Types: 6 Cans of beer per week    Comment: 6 a week if that on the weekends   Drug use: No        ROS Full ROS of systems performed and is otherwise negative there than what is stated in the HPI  Physical Exam Blood pressure 137/89, pulse 72, temperature 98 F (36.7 C), temperature source Oral, height 5' 8 (1.727 m), weight 230 lb 6.4 oz (104.5 kg), SpO2 96%.  Alert and oriented x 3, normal work of breathing room air, regular rate and rhythm, abdomen soft, nondistended there is some mild tenderness in the right lower quadrant without any rebound tenderness or guarding.  I does umbilicus there is a well-healed surgical scar.  There is a slight fullness in the umbilicus but I do not appreciate any fascial defects. Data Reviewed CT scan reviewed and he does have a small hiatal hernia.  I have personally reviewed the patient's imaging and medical records.    Assessment    Patient with concern of recurrence of previous umbilical hernia as well as right lower quadrant pain.  I discussed with him that his pain he is describing is not secondary to any potential umbilical hernia.  In addition to this I do not actually feel a true fascial defect.  I talked with him that if he starts to have pain that would be more consistent with hernia pain such as sharp pain right at the umbilicus then we could potentially get a CT scan to see if there is a recurrence of the hernia.  As far as a hiatal hernia I discussed with him that he does not report any symptoms to me including refractory reflux or dysphagia.  I recommended at this time that he just watch and wait for development of any symptoms from his hiatal hernia prior to any repair.  He can follow-up with our office in an as-needed basis  A  total of 45 minutes was spent reviewing the patient's chart, performing history and physical and discussing treatment options with the patient     Jayson MALVA Endow

## 2024-01-18 ENCOUNTER — Other Ambulatory Visit: Payer: Self-pay

## 2024-01-28 ENCOUNTER — Ambulatory Visit: Admitting: Family Medicine

## 2024-01-28 ENCOUNTER — Other Ambulatory Visit

## 2024-01-28 DIAGNOSIS — I10 Essential (primary) hypertension: Secondary | ICD-10-CM

## 2024-01-28 DIAGNOSIS — E039 Hypothyroidism, unspecified: Secondary | ICD-10-CM

## 2024-01-28 DIAGNOSIS — E782 Mixed hyperlipidemia: Secondary | ICD-10-CM

## 2024-01-28 DIAGNOSIS — R7303 Prediabetes: Secondary | ICD-10-CM

## 2024-01-28 DIAGNOSIS — Z125 Encounter for screening for malignant neoplasm of prostate: Secondary | ICD-10-CM

## 2024-01-28 DIAGNOSIS — Z0000 Encounter for general adult medical examination without abnormal findings: Secondary | ICD-10-CM

## 2024-01-29 LAB — COMPREHENSIVE METABOLIC PANEL WITH GFR
AG Ratio: 1.8 (calc) (ref 1.0–2.5)
ALT: 18 U/L (ref 9–46)
AST: 16 U/L (ref 10–35)
Albumin: 4.6 g/dL (ref 3.6–5.1)
Alkaline phosphatase (APISO): 81 U/L (ref 35–144)
BUN: 17 mg/dL (ref 7–25)
CO2: 29 mmol/L (ref 20–32)
Calcium: 9.9 mg/dL (ref 8.6–10.3)
Chloride: 104 mmol/L (ref 98–110)
Creat: 0.95 mg/dL (ref 0.70–1.30)
Globulin: 2.5 g/dL (ref 1.9–3.7)
Glucose, Bld: 104 mg/dL — ABNORMAL HIGH (ref 65–99)
Potassium: 4 mmol/L (ref 3.5–5.3)
Sodium: 142 mmol/L (ref 135–146)
Total Bilirubin: 0.5 mg/dL (ref 0.2–1.2)
Total Protein: 7.1 g/dL (ref 6.1–8.1)
eGFR: 92 mL/min/1.73m2 (ref >=60)

## 2024-01-29 LAB — CBC WITH DIFFERENTIAL/PLATELET
Absolute Lymphocytes: 1242 {cells}/uL (ref 850–3900)
Absolute Monocytes: 606 {cells}/uL (ref 200–950)
Basophils Absolute: 30 {cells}/uL (ref 0–200)
Basophils Relative: 0.5 %
Eosinophils Absolute: 144 {cells}/uL (ref 15–500)
Eosinophils Relative: 2.4 %
HCT: 45.1 % (ref 38.5–50.0)
Hemoglobin: 15 g/dL (ref 13.2–17.1)
MCH: 30.5 pg (ref 27.0–33.0)
MCHC: 33.3 g/dL (ref 32.0–36.0)
MCV: 91.9 fL (ref 80.0–100.0)
MPV: 10.6 fL (ref 7.5–12.5)
Monocytes Relative: 10.1 %
Neutro Abs: 3978 {cells}/uL (ref 1500–7800)
Neutrophils Relative %: 66.3 %
Platelets: 218 Thousand/uL (ref 140–400)
RBC: 4.91 Million/uL (ref 4.20–5.80)
RDW: 12.6 % (ref 11.0–15.0)
Total Lymphocyte: 20.7 %
WBC: 6 Thousand/uL (ref 3.8–10.8)

## 2024-01-29 LAB — T4, FREE: Free T4: 1.3 ng/dL (ref 0.8–1.8)

## 2024-01-29 LAB — TSH: TSH: 3.34 m[IU]/L (ref 0.40–4.50)

## 2024-01-29 LAB — LIPID PANEL
Cholesterol: 167 mg/dL (ref <200)
HDL: 41 mg/dL (ref >=40)
LDL Cholesterol (Calc): 99 mg/dL
Non-HDL Cholesterol (Calc): 126 mg/dL (ref <130)
Total CHOL/HDL Ratio: 4.1 (calc) (ref <5.0)
Triglycerides: 178 mg/dL — ABNORMAL HIGH (ref <150)

## 2024-01-29 LAB — HEMOGLOBIN A1C
Hgb A1c MFr Bld: 5.5 % (ref <5.7)
Mean Plasma Glucose: 111 mg/dL
eAG (mmol/L): 6.2 mmol/L

## 2024-01-29 LAB — PSA: PSA: 1.03 ng/mL (ref <=4.00)

## 2024-02-04 ENCOUNTER — Encounter: Payer: Self-pay | Admitting: Family Medicine

## 2024-02-04 ENCOUNTER — Ambulatory Visit (INDEPENDENT_AMBULATORY_CARE_PROVIDER_SITE_OTHER): Admitting: Family Medicine

## 2024-02-04 VITALS — BP 130/84 | HR 82 | Ht 68.0 in | Wt 233.1 lb

## 2024-02-04 DIAGNOSIS — I10 Essential (primary) hypertension: Secondary | ICD-10-CM

## 2024-02-04 DIAGNOSIS — E782 Mixed hyperlipidemia: Secondary | ICD-10-CM | POA: Diagnosis not present

## 2024-02-04 DIAGNOSIS — E039 Hypothyroidism, unspecified: Secondary | ICD-10-CM

## 2024-02-04 DIAGNOSIS — R7303 Prediabetes: Secondary | ICD-10-CM

## 2024-02-04 NOTE — Progress Notes (Signed)
 Subjective:    Patient ID: Douglas Bird, male    DOB: 1965/12/31, 58 y.o.   MRN: [id number]  Douglas Bird is a 58 y.o. male presenting on 02/04/2024 for Annual Exam   HPI  Discussed the use of AI scribe software for clinical note transcription with the patient, who gave verbal consent to proceed.  History of Present Illness   Douglas Bird is a 58 year old male who presents for an annual physical exam.   Glycemic control - Recent glucose level 104 mg/dL - Hemoglobin J8r consistently 5.5%  Hyperlipidemia and cardiovascular risk reduction - Cholesterol panel shows significant improvement - Triglycerides decreased to 178 mg/dL from previous values of 234 mg/dL and 599 mg/dL - LDL cholesterol maintained under 100 mg/dL on atorvastatin  40 mg daily - Implementing lifestyle modifications including dietary changes and weight loss - Aiming to lose 30 pounds to reach a goal weight of 190-195 pounds     Hypothyroidism Last lab panel TSH T4 normal Controlled on current Levothyroxine  daily  Morbid Obesity Goal to lose 30 lbs Goal weight 190 to 195 lbs   Gastroesophageal reflux and hiatal hernia - History of hiatal hernia diagnosed approximately 20 years ago after experiencing heartburn and acid reflux, confirmed by x-ray at [facility] - Manages symptoms with omeprazole  20 mg daily and dietary modifications, including avoidance of spicy foods and seeds - Current regimen effective in controlling heartburn and reflux symptoms   Chronic Right hand pain and neuropathy - History of work-related hand injury with a miter saw in February 2022 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 and multiple surgeries Right hand dominant, saw injury at work, Dr Douglas Reduced sensory strength, nerve damage, cannot grasp or lift >10-15 lbs Has had extensive work up and treatment, prior surgeries x 6 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    - Experiences burning sensations in  the hand, described as 'like somebody took a lighter and it just started burning my top of my hand' - Manages pain with ibuprofen  as needed - Previously trialed gabapentin  and Lyrica, discontinued due to side effects   Former smoker / Tobacco use - History of smoking, quit on February 8th of this year after experiencing difficulty breathing and a significant illness - Smoking previously interfered with daily activities - No tobacco use since quitting     Tried pain meds, Gabapentin , Lyrica etc      History Diverticulosis  Health Maintenance:  PSA 1.03 negative  Colonoscopy last done 04/28/19 Dr Unk  GI. No polyps. Next repeat 10 years 2031     02/04/2024    9:47 AM 12/24/2023    9:22 AM 02/01/2023   10:45 AM  Depression screen PHQ 2/9  Decreased Interest 0 1 0  Down, Depressed, Hopeless 0 0 0  PHQ - 2 Score 0 1 0       02/04/2024    9:47 AM 12/24/2023    9:22 AM  GAD 7 : Generalized Anxiety Score  Nervous, Anxious, on Edge 0 0  Control/stop worrying 0 0  Worry too much - different things 0 0  Trouble relaxing 0 0  Restless 0 0  Easily annoyed or irritable 0 0  Afraid - awful might happen 0 0  Total GAD 7 Score 0 0  Anxiety Difficulty Not difficult at all Not difficult at all     Past Medical History:  Diagnosis Date   Diverticulitis 2011   GERD (gastroesophageal reflux disease)  Hyperlipidemia    Hypertension    controlled on meds   Hypothyroidism    Thyroid  disease    Past Surgical History:  Procedure Laterality Date   CHOLECYSTECTOMY  June 2013   COLONOSCOPY WITH PROPOFOL  N/A 04/28/2019   Procedure: COLONOSCOPY WITH PROPOFOL ;  Surgeon: Unk Douglas Skiff, MD;  Location: [facility];  Service: Endoscopy;  Laterality: N/A;  priority 4   HERNIA REPAIR Bilateral    inguinal   REPAIR EXTENSOR TENDON Right 04/28/2020   Procedure: EXPLORATION OF RIGHT DORSAL HAND LACERATION WITH REPAIR OF INDEX EXTENSOR TENDON LACERATION;  Surgeon: Douglas Norleen PARAS, MD;  Location: [facility];  Service: Orthopedics;  Laterality: Right;   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   Procedure: 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.;  Surgeon: Douglas Norleen PARAS, MD;  Location: [facility];  Service: Orthopedics;  Laterality: Right;   RESECTION OF HAND NEUROMA Right 03/23/2021   Procedure: 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 with implantation of the nerve and into the adjacent interosseous musculature.;  Surgeon: Douglas Norleen PARAS, MD;  Location: [facility];  Service: Orthopedics;  Laterality: Right;   UMBILICAL HERNIA REPAIR N/A 08/11/2019   Procedure: HERNIA REPAIR UMBILICAL ADULT;  Surgeon: Marolyn Nest, MD;  Location: [facility];  Service: General;  Laterality: N/A;  RNFA   Social History   Socioeconomic History   Marital status: Married    Spouse name: Not on file   Number of children: Not on file   Years of education: Not on file   Highest education level: Not on file  Occupational History   Not on file  Tobacco Use   Smoking status: Former    Current packs/day: 0.00    Average packs/day: 0.2 packs/day for 18.2 years (4.5 ttl pk-yrs)    Types: Cigarettes    Start date: 02/16/2005    Quit date: 04/28/2023    Years since quitting: 0.7   Smokeless tobacco: Never  Vaping Use   Vaping status: Never Used  Substance and Sexual Activity   Alcohol use: Not Currently    Alcohol/week: 6.0 standard drinks of alcohol    Types: 6 Cans of beer per week    Comment: 6 a week if that on the weekends   Drug use: No   Sexual activity: Yes  Other Topics Concern   Not on file  Social History Narrative   Livs Monta PEPPER )Girlfriend   Social Drivers of Health   Financial Resource Strain: Medium Risk (02/01/2023)   Overall Financial Resource Strain (CARDIA)    Difficulty of Paying Living Expenses: Somewhat hard  Food Insecurity: No Food Insecurity (02/01/2023)   Hunger Vital Sign    Worried About Running Out of  Food in the Last Year: Never true    Ran Out of Food in the Last Year: Never true  Transportation Needs: No Transportation Needs (02/01/2023)   PRAPARE - Administrator, Civil Service (Medical): No    Lack of Transportation (Non-Medical): No  Physical Activity: Inactive (02/01/2023)   Exercise Vital Sign    Days of Exercise per Week: 0 days    Minutes of Exercise per Session: 0 min  Stress: No Stress Concern Present (02/01/2023)   [employer] of Occupational Health - Occupational Stress Questionnaire    Feeling of Stress : Not at all  Social Connections: Moderately Integrated (02/01/2023)   Social Connection and Isolation Panel    Frequency of Communication  with Friends and Family: Three times a week    Frequency of Social Gatherings with Friends and Family: Three times a week    Attends Religious Services: More than 4 times per year    Active Member of Clubs or Organizations: No    Attends Banker Meetings: Never    Marital Status: Married  Catering Manager Violence: Not At Risk (02/01/2023)   Humiliation, Afraid, Rape, and Kick questionnaire    Fear of Current or Ex-Partner: No    Emotionally Abused: No    Physically Abused: No    Sexually Abused: No   Family History  Problem Relation Age of Onset   Cancer Mother    Stroke Father    Gout Father    Aneurysm Father        2006   Thyroid  disease Father    Epilepsy Father    Thyroid  disease Sister    Diabetes Paternal Grandfather    Current Outpatient Medications on File Prior to Visit  Medication Sig   albuterol  (VENTOLIN  HFA) 108 (90 Base) MCG/ACT inhaler Inhale 1-2 puffs into the lungs every 6 (six) hours as needed for wheezing or shortness of breath.   atorvastatin  (LIPITOR) 40 MG tablet Take 1 tablet (40 mg total) by mouth at bedtime.   fluticasone -salmeterol (WIXELA INHUB ) 100-50 MCG/ACT AEPB Inhale 1 puff into the lungs 2 (two) times daily.   hydrochlorothiazide  (MICROZIDE ) 12.5 MG  capsule Take 1 capsule (12.5 mg total) by mouth daily.   ibuprofen  (ADVIL ) 800 MG tablet Take 1 tablet (800 mg total) by mouth every 8 (eight) hours as needed for moderate pain.   levothyroxine  (SYNTHROID ) 150 MCG tablet Take 1 tablet (150 mcg total) by mouth daily before breakfast.   lisinopril  (ZESTRIL ) 20 MG tablet Take 1 tablet (20 mg total) by mouth daily.   omeprazole  (PRILOSEC) 20 MG capsule Take 1 capsule (20 mg total) by mouth daily. Before lunch   sildenafil  (VIAGRA ) 100 MG tablet Take 1 tablet (100 mg total) by mouth daily as needed for erectile dysfunction.   No current facility-administered medications on file prior to visit.    Review of Systems  Constitutional:  Negative for activity change, appetite change, chills, diaphoresis, fatigue and fever.  HENT:  Negative for congestion and hearing loss.   Eyes:  Negative for visual disturbance.  Respiratory:  Negative for cough, chest tightness, shortness of breath and wheezing.   Cardiovascular:  Negative for chest pain, palpitations and leg swelling.  Gastrointestinal:  Negative for abdominal pain, constipation, diarrhea, nausea and vomiting.  Genitourinary:  Negative for dysuria, frequency and hematuria.  Musculoskeletal:  Negative for arthralgias and neck pain.  Skin:  Negative for rash.  Neurological:  Negative for dizziness, weakness, light-headedness, numbness and headaches.  Hematological:  Negative for adenopathy.  Psychiatric/Behavioral:  Negative for behavioral problems, dysphoric mood and sleep disturbance.    Per HPI unless specifically indicated above     Objective:    BP 130/84 (BP Location: Left Arm, Patient Position: Sitting, Cuff Size: Normal)   Pulse 82   Ht 5' 8 (1.727 m)   Wt 233 lb 2 oz (105.7 kg)   SpO2 94%   BMI 35.45 kg/m   Wt Readings from Last 3 Encounters:  02/04/24 233 lb 2 oz (105.7 kg)  01/01/24 230 lb 6.4 oz (104.5 kg)  12/24/23 228 lb 8 oz (103.6 kg)    Physical Exam Vitals and  nursing note reviewed.  Constitutional:      General:  He is not in acute distress.    Appearance: He is well-developed. He is obese. He is not diaphoretic.     Comments: Well-appearing, comfortable, cooperative  HENT:     Head: Normocephalic and atraumatic.  Eyes:     General:        Right eye: No discharge.        Left eye: No discharge.     Conjunctiva/sclera: Conjunctivae normal.     Pupils: Pupils are equal, round, and reactive to light.  Neck:     Thyroid : No thyromegaly.     Vascular: No carotid bruit.  Cardiovascular:     Rate and Rhythm: Normal rate and regular rhythm.     Pulses: Normal pulses.     Heart sounds: Normal heart sounds. No murmur heard. Pulmonary:     Effort: Pulmonary effort is normal. No respiratory distress.     Breath sounds: Normal breath sounds. No wheezing or rales.  Abdominal:     General: Bowel sounds are normal. There is no distension.     Palpations: Abdomen is soft. There is no mass.     Tenderness: There is no abdominal tenderness.  Musculoskeletal:        General: No tenderness. Normal range of motion.     Cervical back: Normal range of motion and neck supple.     Right lower leg: No edema.     Left lower leg: No edema.     Comments: Upper / Lower Extremities: - Normal muscle tone, strength bilateral upper extremities 5/5, lower extremities 5/5  Lymphadenopathy:     Cervical: No cervical adenopathy.  Skin:    General: Skin is warm and dry.     Findings: No erythema or rash.  Neurological:     Mental Status: He is alert and oriented to person, place, and time.     Comments: Distal sensation intact to light touch all extremities  Psychiatric:        Mood and Affect: Mood normal.        Behavior: Behavior normal.        Thought Content: Thought content normal.     Comments: Well groomed, good eye contact, normal speech and thoughts     Results for orders placed or performed in visit on 01/28/24  T4, free   Collection Time: 01/28/24   8:45 AM  Result Value Ref Range   Free T4 1.3 0.8 - 1.8 ng/dL  Comprehensive metabolic panel with GFR   Collection Time: 01/28/24  8:45 AM  Result Value Ref Range   Glucose, Bld 104 (H) 65 - 99 mg/dL   BUN 17 7 - 25 mg/dL   Creat 9.04 9.29 - 8.69 mg/dL   eGFR 92 > OR = 60 fO/fpw/8.26f7   BUN/Creatinine Ratio SEE NOTE: 6 - 22 (calc)   Sodium 142 135 - 146 mmol/L   Potassium 4.0 3.5 - 5.3 mmol/L   Chloride 104 98 - 110 mmol/L   CO2 29 20 - 32 mmol/L   Calcium  9.9 8.6 - 10.3 mg/dL   Total Protein 7.1 6.1 - 8.1 g/dL   Albumin 4.6 3.6 - 5.1 g/dL   Globulin 2.5 1.9 - 3.7 g/dL (calc)   AG Ratio 1.8 1.0 - 2.5 (calc)   Total Bilirubin 0.5 0.2 - 1.2 mg/dL   Alkaline phosphatase (APISO) 81 35 - 144 U/L   AST 16 10 - 35 U/L   ALT 18 9 - 46 U/L  PSA   Collection Time: 01/28/24  8:45 AM  Result Value Ref Range   PSA 1.03 < OR = 4.00 ng/mL  TSH   Collection Time: 01/28/24  8:45 AM  Result Value Ref Range   TSH 3.34 0.40 - 4.50 mIU/L  CBC with Differential/Platelet   Collection Time: 01/28/24  8:45 AM  Result Value Ref Range   WBC 6.0 3.8 - 10.8 Thousand/uL   RBC 4.91 4.20 - 5.80 Million/uL   Hemoglobin 15.0 13.2 - 17.1 g/dL   HCT 54.8 61.4 - 49.9 %   MCV 91.9 80.0 - 100.0 fL   MCH 30.5 27.0 - 33.0 pg   MCHC 33.3 32.0 - 36.0 g/dL   RDW 87.3 88.9 - 84.9 %   Platelets 218 140 - 400 Thousand/uL   MPV 10.6 7.5 - 12.5 fL   Neutro Abs 3,978 1,500 - 7,800 cells/uL   Absolute Lymphocytes 1,242 850 - 3,900 cells/uL   Absolute Monocytes 606 200 - 950 cells/uL   Eosinophils Absolute 144 15 - 500 cells/uL   Basophils Absolute 30 0 - 200 cells/uL   Neutrophils Relative % 66.3 %   Total Lymphocyte 20.7 %   Monocytes Relative 10.1 %   Eosinophils Relative 2.4 %   Basophils Relative 0.5 %  Hemoglobin A1c   Collection Time: 01/28/24  8:45 AM  Result Value Ref Range   Hgb A1c MFr Bld 5.5 <5.7 %   Mean Plasma Glucose 111 mg/dL   eAG (mmol/L) 6.2 mmol/L  Lipid panel   Collection Time:  01/28/24  8:45 AM  Result Value Ref Range   Cholesterol 167 <200 mg/dL   HDL 41 > OR = 40 mg/dL   Triglycerides 821 (H) <150 mg/dL   LDL Cholesterol (Calc) 99 mg/dL (calc)   Total CHOL/HDL Ratio 4.1 <5.0 (calc)   Non-HDL Cholesterol (Calc) 126 <130 mg/dL (calc)      Assessment & Plan:   Problem List Items Addressed This Visit     Hypothyroidism (Chronic)   Benign hypertension   Mixed hyperlipidemia - Primary   Obesity, morbid (HCC)   Other Visit Diagnoses       Pre-diabetes            Updated Health Maintenance information Reviewed recent lab results with patient Encouraged improvement to lifestyle with diet and exercise Goal of weight loss   Adult Wellness Visit Annual wellness visit completed. Lab results satisfactory. Former smoker. - Continue current medications and lifestyle modifications. - Scheduled follow-up visit in six months.  Morbid Obesity BMI >35 Comorbid features with hypothyroidism and hyperlipidemia Counseling on weight management  Mixed hyperlipidemia Cholesterol panel improved. Triglycerides reduced to 178. LDL controlled at 99. Current atorvastatin  regimen effective. - Continue atorvastatin  40 mg daily. - Encouraged continued lifestyle modifications, including diet and exercise. - Consider nutritionist program if interested.  Hypothyroidism Thyroid  levels controlled on levothyroxine  150 mcg daily. TSH and T4 normal. - Continue levothyroxine  150 mcg daily.          No orders of the defined types were placed in this encounter.   No orders of the defined types were placed in this encounter.    Follow up plan: Return in about 6 months (around 08/03/2024) for 6 month follow-up Weight, updates, med refills.  Marsa Officer, DO [facility] 02/04/2024, 10:11 AM

## 2024-02-04 NOTE — Patient Instructions (Addendum)
 Thank you for coming to the office today.  Labs look great  Triglyceride goal < 150, nearly there. Keep improving lifestyle diet exercise  [email or web address]    Please schedule a Follow-up Appointment to: Return in about 6 months (around 08/03/2024) for 6 month follow-up Weight, updates, med refills.  If you have any other questions or concerns, please feel free to call the office or send a message through MyChart. You may also schedule an earlier appointment if necessary.  Additionally, you may be receiving a survey about your experience at our office within a few days to 1 week by e-mail or mail. We value your feedback.  Marsa Officer, DO [facility], [place]

## 2024-02-12 ENCOUNTER — Other Ambulatory Visit: Payer: Self-pay

## 2024-03-25 ENCOUNTER — Other Ambulatory Visit: Payer: Self-pay

## 2024-04-07 ENCOUNTER — Other Ambulatory Visit: Payer: Self-pay

## 2024-04-21 ENCOUNTER — Other Ambulatory Visit: Payer: Self-pay

## 2024-04-23 ENCOUNTER — Other Ambulatory Visit: Payer: Self-pay

## 2024-08-04 ENCOUNTER — Ambulatory Visit: Admitting: Family Medicine
